# Patient Record
Sex: Female | Born: 1950 | Race: White | Hispanic: No | Smoking: Current every day smoker
Health system: Southern US, Community
[De-identification: ages and names within clinical notes are randomized; demographics above are authoritative.]

## PROBLEM LIST (undated history)

## (undated) DIAGNOSIS — F0390 Unspecified dementia without behavioral disturbance: Secondary | ICD-10-CM

## (undated) DIAGNOSIS — F419 Anxiety disorder, unspecified: Secondary | ICD-10-CM

## (undated) DIAGNOSIS — R0600 Dyspnea, unspecified: Secondary | ICD-10-CM

## (undated) DIAGNOSIS — I10 Essential (primary) hypertension: Secondary | ICD-10-CM

## (undated) DIAGNOSIS — F209 Schizophrenia, unspecified: Secondary | ICD-10-CM

## (undated) DIAGNOSIS — R51 Headache: Secondary | ICD-10-CM

## (undated) DIAGNOSIS — O2230 Deep phlebothrombosis in pregnancy, unspecified trimester: Secondary | ICD-10-CM

## (undated) DIAGNOSIS — R519 Headache, unspecified: Secondary | ICD-10-CM

## (undated) HISTORY — DX: Essential (primary) hypertension: I10

## (undated) HISTORY — PX: DIAGNOSTIC LAPAROSCOPY: SUR761

---

## 2005-12-13 ENCOUNTER — Emergency Department (HOSPITAL_COMMUNITY): Admission: EM | Admit: 2005-12-13 | Discharge: 2005-12-13 | Payer: Self-pay | Admitting: Emergency Medicine

## 2009-12-04 ENCOUNTER — Ambulatory Visit (HOSPITAL_COMMUNITY): Payer: Self-pay | Admitting: Psychiatry

## 2010-01-07 ENCOUNTER — Ambulatory Visit: Payer: Self-pay | Admitting: Internal Medicine

## 2010-01-19 ENCOUNTER — Encounter
Admission: RE | Admit: 2010-01-19 | Discharge: 2010-01-19 | Payer: Self-pay | Source: Home / Self Care | Attending: Neurology | Admitting: Neurology

## 2010-10-03 ENCOUNTER — Encounter: Payer: Medicaid Other | Attending: Family Medicine | Admitting: *Deleted

## 2010-10-03 ENCOUNTER — Encounter: Payer: Self-pay | Admitting: *Deleted

## 2010-10-03 DIAGNOSIS — Z713 Dietary counseling and surveillance: Secondary | ICD-10-CM | POA: Insufficient documentation

## 2010-10-03 DIAGNOSIS — E119 Type 2 diabetes mellitus without complications: Secondary | ICD-10-CM | POA: Insufficient documentation

## 2010-10-03 NOTE — Progress Notes (Signed)
  Medical Nutrition Therapy:  Appt start time: 1030   End time:  1130.  Assessment:  Primary concerns today: T2DM. Pt here with neighbor for assessment.  Appears medicated and a little "out of it"; c/o pain in feet. States she hasn't been to the podiatrist, but then reports her DM shoes are on order.  Per neighbor, pt lives with daughter who is a Engineer, civil (consulting) and makes sure she is eating well. Pt reports recent FBGs of 131-146 mg/dL.  A1c unavailable at time of visit. Neighbor reports pt walks 2 blocks every night at daughter's insistence. Pt reports both she and daughter grocery shop and cook meals.   MEDICATIONS: Metformin, Invega, Trazadone (prn)   DIETARY INTAKE:  Usual eating pattern includes 3 meals and 0 snacks per day.  24-hr recall:  B ( AM): Special K w/ 2% milk, wheat toast w/ butter, egg; 2% milk  Snk ( AM): none  L ( PM): [place] steak, squash, peaches, mac-n-cheese, roll, chocolate cookie; water, milk (at [employer]) Snk ( PM): none D ( PM): Crab meat, broccoli w/ cheddar cheese, roasted potatoes; serial performance (gatorade) Snk ( PM): none  Usual physical activity: Walks 2 blocks 5 day/week per neighbor  Estimated energy needs: 1300 calories 155 g carbohydrates 75 g protein 40-45 g fat  Nutritional Diagnosis:  [place]-2.1 Impaired nutrient utilization related to CHO intake as evidenced by diagnosis of T2DM and MD referral for assessment.    Intervention/Goals:  Follow Diabetes Meal Plan as instructed (inside tan book).  Eat 3 meals and 1-2 snacks, or every 3-5 hrs.  Limit carbohydrate intake to 45 grams/meal and 15 grams/snack.  Add lean protein foods to all meals/snacks.  Aim for 30 mins of physical activity daily.  Bring food record and glucose log to your next nutrition visit.   Handouts given during visit include:  Healthy Eating Guidelines - BD   Count Your Carbs: Getting Started - book  45 g CHO meals  CHO/Pro snack list  Monitoring/Evaluation:   Dietary intake, exercise, A1c, BGs, and body weight prn or if A1c/BG trend worsens.

## 2010-10-03 NOTE — Patient Instructions (Signed)
Goals:  Follow Diabetes Meal Plan as instructed (inside tan book).  Eat 3 meals and 1-2 snacks, or every 3-5 hrs.  Limit carbohydrate intake to 45 grams/meal and 15 grams/snack.  Add lean protein foods to all meals/snacks.  Aim for 30 mins of physical activity daily.  Bring food record and glucose log to your next nutrition visit.

## 2011-04-11 ENCOUNTER — Emergency Department (HOSPITAL_COMMUNITY)
Admission: EM | Admit: 2011-04-11 | Discharge: 2011-04-11 | Disposition: A | Discharge status: Home / Self Care | Payer: Medicaid Other | Attending: Emergency Medicine | Admitting: Emergency Medicine

## 2011-04-11 ENCOUNTER — Emergency Department (HOSPITAL_COMMUNITY): Payer: Medicaid Other

## 2011-04-11 ENCOUNTER — Encounter (HOSPITAL_COMMUNITY): Payer: Self-pay | Admitting: *Deleted

## 2011-04-11 DIAGNOSIS — F17200 Nicotine dependence, unspecified, uncomplicated: Secondary | ICD-10-CM | POA: Insufficient documentation

## 2011-04-11 DIAGNOSIS — M25539 Pain in unspecified wrist: Secondary | ICD-10-CM | POA: Insufficient documentation

## 2011-04-11 DIAGNOSIS — M545 Low back pain, unspecified: Secondary | ICD-10-CM | POA: Insufficient documentation

## 2011-04-11 DIAGNOSIS — S6990XA Unspecified injury of unspecified wrist, hand and finger(s), initial encounter: Secondary | ICD-10-CM | POA: Insufficient documentation

## 2011-04-11 DIAGNOSIS — R51 Headache: Secondary | ICD-10-CM | POA: Insufficient documentation

## 2011-04-11 DIAGNOSIS — Y92009 Unspecified place in unspecified non-institutional (private) residence as the place of occurrence of the external cause: Secondary | ICD-10-CM | POA: Insufficient documentation

## 2011-04-11 DIAGNOSIS — S59909A Unspecified injury of unspecified elbow, initial encounter: Secondary | ICD-10-CM | POA: Insufficient documentation

## 2011-04-11 DIAGNOSIS — W19XXXA Unspecified fall, initial encounter: Secondary | ICD-10-CM

## 2011-04-11 DIAGNOSIS — M542 Cervicalgia: Secondary | ICD-10-CM | POA: Insufficient documentation

## 2011-04-11 DIAGNOSIS — W108XXA Fall (on) (from) other stairs and steps, initial encounter: Secondary | ICD-10-CM | POA: Insufficient documentation

## 2011-04-11 DIAGNOSIS — M546 Pain in thoracic spine: Secondary | ICD-10-CM | POA: Insufficient documentation

## 2011-04-11 DIAGNOSIS — M25559 Pain in unspecified hip: Secondary | ICD-10-CM | POA: Insufficient documentation

## 2011-04-11 HISTORY — DX: Schizophrenia, unspecified: F20.9

## 2011-04-11 LAB — CBC
HCT: 41.8 % (ref 36.0–46.0)
MCHC: 34.7 g/dL (ref 30.0–36.0)
RBC: 4.82 MIL/uL (ref 3.87–5.11)
RDW: 15.4 % (ref 11.5–15.5)
WBC: 10.6 10*3/uL — ABNORMAL HIGH (ref 4.0–10.5)

## 2011-04-11 LAB — DIFFERENTIAL
Basophils Relative: 0 % (ref 0–1)
Lymphocytes Relative: 45 % (ref 12–46)
Lymphs Abs: 4.8 10*3/uL — ABNORMAL HIGH (ref 0.7–4.0)
Monocytes Relative: 5 % (ref 3–12)
Neutro Abs: 5.1 10*3/uL (ref 1.7–7.7)
Neutrophils Relative %: 48 % (ref 43–77)

## 2011-04-11 LAB — BASIC METABOLIC PANEL
Calcium: 9.6 mg/dL (ref 8.4–10.5)
Creatinine, Ser: 0.62 mg/dL (ref 0.50–1.10)
Potassium: 3.8 mEq/L (ref 3.5–5.1)
Sodium: 138 mEq/L (ref 135–145)

## 2011-04-11 MED ORDER — TRAMADOL HCL 50 MG PO TABS: 50.0000 mg | ORAL_TABLET | Freq: Four times a day (QID) | ORAL | Status: AC | PRN

## 2011-04-11 MED ORDER — ACETAMINOPHEN 325 MG PO TABS
650.0000 mg | ORAL_TABLET | Freq: Once | ORAL | Status: AC
  Administered 2011-04-11: 650 mg via ORAL
  Filled 2011-04-11: qty 2

## 2011-04-11 NOTE — ED Notes (Signed)
Pt states understanding of discharge instructions 

## 2011-04-11 NOTE — Discharge Instructions (Signed)
Multiple Traumas Bruises and sore muscles are common after injuries. These usually feel worse for the first 24 hours. You may have more stiffness and soreness over the next several hours to several days. It may be worse when you wake up the first morning following your injury. You will improve with each passing day. The amount of improvement often depends on the amount of damage done. HOME CARE INSTRUCTIONS   Put ice on the injured area.   Put ice in a plastic bag.   Place a towel between your skin and the bag.   Leave the ice on for 15 to 20 minutes, 3 to 4 times a day.   Drink more fluids than normal. Do not drink alcohol.   Take a hot or warm shower or bath once or twice a day. This will increase blood flow to sore muscles.   Return to activity as tolerated. Lifting may aggravate neck or back pain.   Only take over-the-counter or prescription medicines for pain, discomfort, or fever as directed by your caregiver. Do not use aspirin. This may increase bruising because aspirin decreases the ability of blood to form clots that stop bleeding.  SEEK IMMEDIATE MEDICAL CARE IF:  You have numbness, tingling, weakness, or problems with the use of your arms or legs.   You have severe headaches not relieved with medicines.   You have changes in bowel or bladder control.   You develop increasing pain in any areas of the body.   You have shortness of breath, dizziness, or fainting.   You have a fever.   You feel sick to your stomach (nauseous), throw up (vomit), or sweat.   You have increasing belly pain.   You have blood in your urine, stool, or vomit.   You have pain in either shoulder or in an area where a shoulder strap would be.   You feel lightheaded or faint.   You feel you may be getting worse rather than better.   Your symptoms are worsening.  MAKE SURE YOU:   Understand these instructions.   Will watch your condition.   Will get help right away if you are not doing  well or get worse.  Document Released: 01/26/2007 Document Revised: 12/26/2010 Document Reviewed: 06/26/2008 [facility] Patient Information 2012 [place], [place].

## 2011-04-11 NOTE — ED Notes (Signed)
Pt arrived via [facility] c/o Fall down 14 steps, c/o right hip and right lower back pain. Denies LOC. EMS found no deformities. EMS VS Pulse 84, BP 164/p, RR 16.

## 2011-04-11 NOTE — ED Provider Notes (Signed)
History     CSN: [id number]  Arrival date & time 04/11/11  8469   First MD Initiated Contact with Patient 04/11/11 0340      Chief Complaint  Patient presents with  . Fall    HPI The patient got up to go to the bathroom. She was walking by the steps when she lost her balance and fell. Her daughter states the patient tumbled down 14 steps. She is now complaining of pain in her hip and lower back. She's not sure she lost consciousness but she does have pain in the back of her neck as well. Patient has not had any abdominal pain or chest pain. She has had a recent cough but otherwise has felt fine. She lives at home with her daughter. Patient was transported by EMS. She arrived in a long spine board with a cervical collar in place. She states the pain is not very severe now and would just like and aspirin.  No past medical history on file.  No past surgical history on file.  No family history on file.  History  Substance Use Topics  . Smoking status: Current Everyday Smoker -- 0.2 packs/day    Types: Cigarettes  . Smokeless tobacco: Not on file  . Alcohol Use: No    OB History    Grav Para Term Preterm Abortions TAB SAB Ect Mult Living                  Review of Systems  All other systems reviewed and are negative.    Allergies  Review of patient's allergies indicates no known allergies.  Home Medications   Current Outpatient Rx  Name Route Sig Dispense Refill  . METFORMIN HCL PO Oral Take 500 mg by mouth daily.     Marland Kitchen MIRTAZAPINE 30 MG PO TBDP Oral Take 30 mg by mouth at bedtime as needed. For sleep    . ADULT MULTIVITAMIN W/MINERALS CH Oral Take 1 tablet by mouth daily.    . INVEGA PO Oral Take 18 mg by mouth daily.     . TRAZODONE HCL 50 MG PO TABS Oral Take 50 mg by mouth as needed.        BP 125/81  Pulse 69  Temp(Src) 98.1 F (36.7 C) (Oral)  Resp 18  SpO2 99%  Physical Exam  Nursing note and vitals reviewed. Constitutional: She appears well-developed  and well-nourished. No distress.  HENT:  Head: Normocephalic and atraumatic.  Right Ear: External ear normal.  Left Ear: External ear normal.  Eyes: Conjunctivae are normal. Right eye exhibits no discharge. Left eye exhibits no discharge. No scleral icterus.  Neck: Neck supple. No tracheal deviation present.  Cardiovascular: Normal rate, regular rhythm and intact distal pulses.   Pulmonary/Chest: Effort normal and breath sounds normal. No stridor. No respiratory distress. She has no wheezes. She has no rales.  Abdominal: Soft. Bowel sounds are normal. She exhibits no distension. There is no tenderness. There is no rebound and no guarding.  Musculoskeletal: She exhibits no edema and no tenderness.       Right wrist: She exhibits tenderness. She exhibits no swelling.       Cervical back: She exhibits decreased range of motion and tenderness. She exhibits no swelling and no edema.       Thoracic back: She exhibits tenderness. She exhibits no swelling and no edema.       Lumbar back: She exhibits tenderness. She exhibits no swelling and no edema.  Neurological:  She is alert. She has normal strength. No sensory deficit. Cranial nerve deficit:  no gross defecits noted. She exhibits normal muscle tone. She displays no seizure activity. Coordination normal.  Skin: Skin is warm and dry. No rash noted.  Psychiatric: She has a normal mood and affect.    ED Course  Procedures (including critical care time)  Labs Reviewed  DIFFERENTIAL - Abnormal; Notable for the following:    Lymphs Abs 4.8 (*)    All other components within normal limits  BASIC METABOLIC PANEL - Abnormal; Notable for the following:    Glucose, Bld 125 (*)    All other components within normal limits  CBC - Abnormal; Notable for the following:    WBC 10.6 (*)    All other components within normal limits   Dg Chest 1 View  04/11/2011  *RADIOLOGY REPORT*  Clinical Data: Status post fall down stairs; concern for chest injury.   CHEST - 1 VIEW  Comparison: Chest radiograph performed 12/13/2005  Findings: The lungs are well-aerated.  Chronic peribronchial thickening is noted.  There is no evidence of focal opacification, pleural effusion or pneumothorax.  The cardiomediastinal silhouette is within normal limits.  No acute osseous abnormalities are seen.  IMPRESSION: No acute cardiopulmonary process seen; no displaced rib fractures identified.  Chronic peribronchial thickening noted.  Original Report Authenticated By: Tonia Ghent, M.D.   Dg Thoracic Spine 2 View  04/11/2011  *RADIOLOGY REPORT*  Clinical Data: Status post fall; back pain.  THORACIC SPINE - 2 VIEW  Comparison: Chest radiograph performed 12/13/2005  Findings: There is no evidence of fracture or subluxation. Vertebral bodies demonstrate normal height and alignment.  Mild degenerative change is noted at the lower cervical spine.  The visualized portions of both lungs are clear.  The mediastinum is unremarkable in appearance.  IMPRESSION:  1.  No evidence of fracture or subluxation along the cervical spine. 2.  Mild degenerative change at the lower cervical spine.  Original Report Authenticated By: Tonia Ghent, M.D.   Dg Lumbar Spine Complete  04/11/2011  *RADIOLOGY REPORT*  Clinical Data: Status post fall down stairs; lower back pain.  LUMBAR SPINE - COMPLETE 4+ VIEW  Comparison: None.  Findings: There is no evidence of fracture or subluxation. Vertebral bodies demonstrate normal height and alignment. Intervertebral disc spaces are preserved.  The visualized bowel gas pattern is unremarkable in appearance; air and stool are noted within the colon.  The sacroiliac joints are within normal limits.  Mild scattered calcification is noted at the distal abdominal aorta.  IMPRESSION: No evidence of fracture or subluxation along the lumbar spine.  Original Report Authenticated By: Tonia Ghent, M.D.   Dg Wrist Complete Right  04/11/2011  *RADIOLOGY REPORT*  Clinical Data:  Status post fall; right wrist pain.  RIGHT WRIST - COMPLETE 3+ VIEW  Comparison: None.  Findings: A small osseous fragment is noted along the dorsum of the carpal rows, raising question for a small fracture.  There is no additional evidence for fracture.  The carpal rows demonstrate normal alignment.  The joint spaces are preserved.  No significant soft tissue abnormalities are seen.  IMPRESSION: Small osseous fragment noted along the dorsum of the carpal rows on the lateral view, raising question for a small fracture.  No additional evidence for fracture.  Original Report Authenticated By: Tonia Ghent, M.D.   Dg Hip Complete Right  04/11/2011  *RADIOLOGY REPORT*  Clinical Data: Status post fall down stairs; right hip pain.  RIGHT HIP - COMPLETE  2+ VIEW  Comparison: None.  Findings: There is no evidence of fracture or dislocation.  Both femoral heads are seated normally within their respective acetabula.  The proximal right femur appears intact.  No significant degenerative change is appreciated.  The sacroiliac joints are unremarkable in appearance.  The visualized bowel gas pattern is grossly unremarkable in appearance.  Scattered vascular calcifications are noted within the pelvis.  IMPRESSION: No evidence of fracture or dislocation.  Original Report Authenticated By: Tonia Ghent, M.D.   Ct Head Wo Contrast  04/11/2011  *RADIOLOGY REPORT*  Clinical Data:  Status post fall; neck pain and headache.  CT HEAD WITHOUT CONTRAST AND CT CERVICAL SPINE WITHOUT CONTRAST  Technique:  Multidetector CT imaging of the head and cervical spine was performed following the standard protocol without intravenous contrast.  Multiplanar CT image reconstructions of the cervical spine were also generated.  Comparison: MRI of the brain performed 01/19/2010  CT HEAD  Findings: There is no evidence of acute infarction, mass lesion, or intra- or extra-axial hemorrhage on CT.  The posterior fossa, including the cerebellum,  brainstem and fourth ventricle, is within normal limits.  The third and lateral ventricles, and basal ganglia are unremarkable in appearance.  The cerebral hemispheres are symmetric in appearance, with normal gray- white differentiation.  No mass effect or midline shift is seen.  There is no evidence of fracture; visualized osseous structures are unremarkable in appearance.  The visualized portions of the orbits are within normal limits.  The paranasal sinuses and mastoid air cells are well-aerated.  No significant soft tissue abnormalities are seen.  IMPRESSION: No evidence of traumatic intracranial injury or fracture.  CT CERVICAL SPINE  Findings: There is no evidence of fracture or subluxation. Vertebral bodies demonstrate normal height and alignment.  There is mild multilevel disc space narrowing along the lower cervical spine, with associated anterior and posterior disc osteophyte complexes.  Mild associated kyphosis is seen.  Prevertebral soft tissues are within normal limits.  The thyroid gland is unremarkable in appearance.  Mild emphysema is suggested at the lung apices.  No significant soft tissue abnormalities are seen.  IMPRESSION:  1.  No evidence of fracture or subluxation along the cervical spine. 2.  Mild degenerative change at the lower cervical spine, with associated mild kyphosis. 3.  Suggestion of mild emphysema at the lung apices.  Original Report Authenticated By: Tonia Ghent, M.D.   Ct Cervical Spine Wo Contrast  04/11/2011  *RADIOLOGY REPORT*  Clinical Data:  Status post fall; neck pain and headache.  CT HEAD WITHOUT CONTRAST AND CT CERVICAL SPINE WITHOUT CONTRAST  Technique:  Multidetector CT imaging of the head and cervical spine was performed following the standard protocol without intravenous contrast.  Multiplanar CT image reconstructions of the cervical spine were also generated.  Comparison: MRI of the brain performed 01/19/2010  CT HEAD  Findings: There is no evidence of acute  infarction, mass lesion, or intra- or extra-axial hemorrhage on CT.  The posterior fossa, including the cerebellum, brainstem and fourth ventricle, is within normal limits.  The third and lateral ventricles, and basal ganglia are unremarkable in appearance.  The cerebral hemispheres are symmetric in appearance, with normal gray- white differentiation.  No mass effect or midline shift is seen.  There is no evidence of fracture; visualized osseous structures are unremarkable in appearance.  The visualized portions of the orbits are within normal limits.  The paranasal sinuses and mastoid air cells are well-aerated.  No significant soft tissue abnormalities are  seen.  IMPRESSION: No evidence of traumatic intracranial injury or fracture.  CT CERVICAL SPINE  Findings: There is no evidence of fracture or subluxation. Vertebral bodies demonstrate normal height and alignment.  There is mild multilevel disc space narrowing along the lower cervical spine, with associated anterior and posterior disc osteophyte complexes.  Mild associated kyphosis is seen.  Prevertebral soft tissues are within normal limits.  The thyroid gland is unremarkable in appearance.  Mild emphysema is suggested at the lung apices.  No significant soft tissue abnormalities are seen.  IMPRESSION:  1.  No evidence of fracture or subluxation along the cervical spine. 2.  Mild degenerative change at the lower cervical spine, with associated mild kyphosis. 3.  Suggestion of mild emphysema at the lung apices.  Original Report Authenticated By: Tonia Ghent, M.D.     1. Fall   2. Wrist injury       MDM  The patient fortunately does not appear to have suffered any serious injuries. The wrist x-ray shows a possibility of a carpal bone fracture. The patient was placed in a splint and I will give her a referral to Dr. Terese Door who is on call for her hand injuries today. Findings have been discussed with patient and her        Celene Kras,  MD 04/11/11 [phone]

## 2011-06-30 ENCOUNTER — Encounter (INDEPENDENT_AMBULATORY_CARE_PROVIDER_SITE_OTHER): Payer: Self-pay

## 2011-07-21 ENCOUNTER — Other Ambulatory Visit (INDEPENDENT_AMBULATORY_CARE_PROVIDER_SITE_OTHER): Payer: Self-pay | Admitting: General Surgery

## 2011-07-21 ENCOUNTER — Other Ambulatory Visit: Payer: Self-pay | Admitting: Internal Medicine

## 2011-07-21 ENCOUNTER — Encounter (INDEPENDENT_AMBULATORY_CARE_PROVIDER_SITE_OTHER): Payer: Self-pay | Admitting: General Surgery

## 2011-07-21 ENCOUNTER — Ambulatory Visit (INDEPENDENT_AMBULATORY_CARE_PROVIDER_SITE_OTHER): Payer: Medicaid Other | Admitting: General Surgery

## 2011-07-21 VITALS — BP 118/84 | HR 77 | Temp 97.2°F | Resp 16 | Ht 64.0 in | Wt 125.4 lb

## 2011-07-21 DIAGNOSIS — K824 Cholesterolosis of gallbladder: Secondary | ICD-10-CM

## 2011-07-21 DIAGNOSIS — R160 Hepatomegaly, not elsewhere classified: Secondary | ICD-10-CM

## 2011-07-21 DIAGNOSIS — K769 Liver disease, unspecified: Secondary | ICD-10-CM

## 2011-07-21 NOTE — Patient Instructions (Signed)
Follow up in 2-3 weeks after MRI.

## 2011-07-21 NOTE — Assessment & Plan Note (Signed)
Polyp likely benign, but is large Pt also has 1.4 cm liver mass. Given both in conjunction, would order MRI liver to rule out malignancy.    I will follow up after MRI.  Pt has paranoid schizophrenia, so this complicates exam.  Her daughter makes medical decisions.

## 2011-07-21 NOTE — Progress Notes (Signed)
Chief Complaint  Patient presents with  . Other    new pt-eval GB lesion    HISTORY: Pt presents with probable gallbladder polyp on imaging.  She was having some RUQ pain.  She complained of pain with movement and with food.  She underwent ultrasound which showed a lesion in her liver and a possible 3.5 cm gallbladder polyp.  She is no longer having any abdominal pain, but does have bloating.  She denies nausea or vomiting.  She denies jaundice.  She is unaware of family members with gallbladder disease.  She has schizophrenia and is unable to fully follow the conversation.  The history is corroborated with the daughter.    Past Medical History  Diagnosis Date  . Schizophrenia   . Diabetes mellitus   . Hypertension     History reviewed. No pertinent past surgical history.  Current Outpatient Prescriptions  Medication Sig Dispense Refill  . lisinopril (PRINIVIL,ZESTRIL) 5 MG tablet Take 5 mg by mouth daily.      Marland Kitchen METFORMIN HCL PO Take 500 mg by mouth daily.       . mirtazapine (REMERON SOL-TAB) 30 MG disintegrating tablet Take 30 mg by mouth at bedtime as needed. For sleep      . Multiple Vitamin (MULITIVITAMIN WITH MINERALS) TABS Take 1 tablet by mouth daily.      . Paliperidone (INVEGA PO) Take 18 mg by mouth daily.       . traZODone (DESYREL) 50 MG tablet Take 50 mg by mouth as needed.           No Known Allergies   Family History  Problem Relation Age of Onset  . Diabetes Sister   . Heart disease Mother      History   Social History  . Marital Status: Divorced    Spouse Name: N/A    Number of Children: N/A  . Years of Education: N/A   Social History Main Topics  . Smoking status: Current Everyday Smoker -- 0.2 packs/day    Types: Cigarettes  . Smokeless tobacco: None  . Alcohol Use: No  . Drug Use: Yes    Special: Marijuana  . Sexually Active: None     quit 5 years ago     REVIEW OF SYSTEMS - PERTINENT POSITIVES ONLY: 12 point review of systems negative  other than HPI and PMH  EXAM: Filed Vitals:   07/21/11 0923  BP: 118/84  Pulse: 77  Temp: 97.2 F (36.2 C)  Resp: 16    Gen:  No acute distress.  Well nourished and well groomed.   Neurological: Alert and oriented to person, place, and time. Coordination normal.  Head: Normocephalic and atraumatic.  Eyes: Conjunctivae are normal. Pupils are equal, round, and reactive to light. No scleral icterus.  Neck: Normal range of motion. Neck supple. No tracheal deviation or thyromegaly present.  Cardiovascular: Normal rate, regular rhythm, normal heart sounds and intact distal pulses.  Exam reveals no gallop and no friction rub.  No murmur heard. Respiratory: Effort normal.  No respiratory distress. No chest wall tenderness. Breath sounds normal.  No wheezes, rales or rhonchi.  GI: Soft. Bowel sounds are normal. Slightly bloated.  The abdomen is soft and nontender.  There is no rebound and no guarding.  Musculoskeletal: Normal range of motion. Extremities are nontender.  Lymphadenopathy: No cervical, preauricular, postauricular or axillary adenopathy is present Skin: Skin is warm and dry. No rash noted. No diaphoresis. No erythema. No pallor. No clubbing, cyanosis, or  edema.   Psychiatric: Flat affect.  Does not seem to understand anything that we are discussing.       RADIOLOGY RESULTS: Ultrasound at [facility].   Small subcapsular left hepatic lobe nodule.  Likely hemangioma, but unable to fully characterize. Large lobulated non mobile intraluminal GB lesion.  3.5 cm Large polyp vs sludge.    ASSESSMENT AND PLAN: Gallbladder polyp Polyp likely benign, but is large Pt also has 1.4 cm liver mass. Given both in conjunction, would order MRI liver to rule out malignancy.    I will follow up after MRI.  Pt has paranoid schizophrenia, so this complicates exam.  Her daughter makes medical decisions.    Pt does not seem symptomatic from gallbladder disease at this time in terms of pain,  but she does complain of bloating.  Will likely need cholecystectomy for polyp.    Maudry Diego MD Surgical Oncology, General and Endocrine Surgery [facility]      Visit Diagnoses: 1. Gallbladder polyp   2. Liver mass     Primary Care Physician: Alva Garnet., MD

## 2011-07-29 ENCOUNTER — Ambulatory Visit
Admission: RE | Admit: 2011-07-29 | Discharge: 2011-07-29 | Disposition: A | Discharge status: Home / Self Care | Payer: Medicaid Other | Source: Ambulatory Visit | Attending: General Surgery | Admitting: General Surgery

## 2011-07-29 DIAGNOSIS — K824 Cholesterolosis of gallbladder: Secondary | ICD-10-CM

## 2011-07-29 MED ORDER — GADOBENATE DIMEGLUMINE 529 MG/ML IV SOLN
10.0000 mL | Freq: Once | INTRAVENOUS | Status: AC | PRN
  Administered 2011-07-29: 10 mL via INTRAVENOUS

## 2011-08-08 ENCOUNTER — Encounter (INDEPENDENT_AMBULATORY_CARE_PROVIDER_SITE_OTHER): Payer: Medicaid Other | Admitting: General Surgery

## 2011-08-18 NOTE — Progress Notes (Signed)
Quick Note:  Please let pt's sister know that there is no evidence of gallbladder mass on MRI. Pt does have a few liver lesions that appear benign, but we should get repeat scan in 3 months to reassess. ______

## 2011-08-19 ENCOUNTER — Telehealth (INDEPENDENT_AMBULATORY_CARE_PROVIDER_SITE_OTHER): Payer: Self-pay

## 2011-08-19 NOTE — Telephone Encounter (Signed)
Notified the pt's daughter of MRI results.  We will f/u in 3 months and order a repeat scan.  She will relay the message to Ms. Jacinto Reap.

## 2011-09-05 ENCOUNTER — Encounter (INDEPENDENT_AMBULATORY_CARE_PROVIDER_SITE_OTHER): Payer: Medicaid Other | Admitting: General Surgery

## 2011-09-29 ENCOUNTER — Encounter (INDEPENDENT_AMBULATORY_CARE_PROVIDER_SITE_OTHER): Payer: Medicaid Other | Admitting: General Surgery

## 2011-10-07 ENCOUNTER — Encounter (INDEPENDENT_AMBULATORY_CARE_PROVIDER_SITE_OTHER): Payer: Self-pay | Admitting: General Surgery

## 2011-11-10 ENCOUNTER — Telehealth (INDEPENDENT_AMBULATORY_CARE_PROVIDER_SITE_OTHER): Payer: Self-pay

## 2011-11-10 NOTE — Telephone Encounter (Signed)
Unable to leave message.  Pt is due for MRI in late October.

## 2011-11-26 ENCOUNTER — Other Ambulatory Visit (INDEPENDENT_AMBULATORY_CARE_PROVIDER_SITE_OTHER): Payer: Self-pay | Admitting: General Surgery

## 2011-11-26 DIAGNOSIS — R160 Hepatomegaly, not elsewhere classified: Secondary | ICD-10-CM

## 2011-12-24 ENCOUNTER — Other Ambulatory Visit: Payer: Medicaid Other

## 2012-01-27 ENCOUNTER — Other Ambulatory Visit (INDEPENDENT_AMBULATORY_CARE_PROVIDER_SITE_OTHER): Payer: Self-pay | Admitting: General Surgery

## 2012-01-27 DIAGNOSIS — R160 Hepatomegaly, not elsewhere classified: Secondary | ICD-10-CM

## 2012-11-22 ENCOUNTER — Ambulatory Visit (INDEPENDENT_AMBULATORY_CARE_PROVIDER_SITE_OTHER): Payer: Medicaid Other | Admitting: General Surgery

## 2012-11-24 ENCOUNTER — Encounter (INDEPENDENT_AMBULATORY_CARE_PROVIDER_SITE_OTHER): Payer: Self-pay | Admitting: General Surgery

## 2013-01-24 ENCOUNTER — Ambulatory Visit (INDEPENDENT_AMBULATORY_CARE_PROVIDER_SITE_OTHER): Payer: Medicaid Other | Admitting: General Surgery

## 2013-02-03 ENCOUNTER — Ambulatory Visit (INDEPENDENT_AMBULATORY_CARE_PROVIDER_SITE_OTHER): Payer: Medicaid Other | Admitting: General Surgery

## 2013-06-09 ENCOUNTER — Encounter: Payer: Self-pay | Admitting: *Deleted

## 2013-06-10 ENCOUNTER — Encounter (INDEPENDENT_AMBULATORY_CARE_PROVIDER_SITE_OTHER): Payer: Self-pay

## 2013-06-10 ENCOUNTER — Ambulatory Visit (INDEPENDENT_AMBULATORY_CARE_PROVIDER_SITE_OTHER): Payer: Medicaid Other | Admitting: Neurology

## 2013-06-10 ENCOUNTER — Encounter: Payer: Self-pay | Admitting: Neurology

## 2013-06-10 VITALS — BP 125/73 | HR 76 | Ht 64.0 in | Wt 121.0 lb

## 2013-06-10 DIAGNOSIS — F209 Schizophrenia, unspecified: Secondary | ICD-10-CM

## 2013-06-10 DIAGNOSIS — R4189 Other symptoms and signs involving cognitive functions and awareness: Secondary | ICD-10-CM

## 2013-06-10 DIAGNOSIS — R251 Tremor, unspecified: Secondary | ICD-10-CM

## 2013-06-10 DIAGNOSIS — R259 Unspecified abnormal involuntary movements: Secondary | ICD-10-CM

## 2013-06-10 DIAGNOSIS — F09 Unspecified mental disorder due to known physiological condition: Secondary | ICD-10-CM

## 2013-06-10 NOTE — Progress Notes (Signed)
[facility]    Provider:  Dr Janann Colonel Referring Provider: Everardo Beals, NP Primary Care Physician:  Imelda Pillow, NP  CC:  Abnormal behavior  HPI:  Kristen Colon is a 63 y.o. female here as a referral from Dr. Benna Dunks for abnormal behavior    Kristen Colon is a 63 y.o. female here as a referral for erratic behavior, nervousness/anxiety and memory decline. Daughter notes a higher obsession with smoking, notes she is constantly moving. She has a known diagnosis of schizophrenia and is followed by Dr Burman Foster. Daughter notes difficulty with short term memory, will have conversations and immediately forget what she talks about. Patient reports having trouble recalling things she reads. Daughter notes an overall functional decline. She has been on Invega for the schizophrenia for > 10years, daughter is not sure if it is still working.   She has bilateral cataracts and decreased visual acuity related to this, patient does not wish to have surgical correction.   CT head reviewed and unremarkable  Review of Systems: Out of a complete 14 system review, the patient complains of only the following symptoms, and all other reviewed systems are negative. + blurred vision, weight loss, fatigue, memory loss, confusion, numbness, weakness  History   Social History  . Marital Status: Divorced    Spouse Name: N/A    Number of Children: N/A  . Years of Education: N/A   Occupational History  . Not on file.   Social History Main Topics  . Smoking status: Current Every Day Smoker -- 0.20 packs/day    Types: Cigarettes  . Smokeless tobacco: Not on file  . Alcohol Use: No  . Drug Use: Yes    Special: Marijuana  . Sexual Activity: Not on file     Comment: quit 5 years ago   Other Topics Concern  . Not on file   Social History Narrative  . No narrative on file    Family History  Problem Relation Age of Onset  . Diabetes Sister   . Heart disease Mother      Past Medical History  Diagnosis Date  . Schizophrenia   . Diabetes mellitus   . Hypertension     No past surgical history on file.  Current Outpatient Prescriptions  Medication Sig Dispense Refill  . brompheniramine-pseudoephedrine (DIMETAPP) 1-15 MG/5ML ELIX Take by mouth 2 (two) times daily as needed for allergies.      Marland Kitchen gabapentin (NEURONTIN) 300 MG capsule Take 300 mg by mouth 3 (three) times daily.      Marland Kitchen glucose blood test strip 1 each by Other route as needed for other. Use as instructed      . Influenza Vac Typ A&B Surf Ant (FLUVIRIN) 0.5 ML SUSY Inject into the muscle.      . lisinopril (PRINIVIL,ZESTRIL) 5 MG tablet Take 5 mg by mouth daily.      Marland Kitchen METFORMIN HCL PO Take 500 mg by mouth daily.       . mirtazapine (REMERON SOL-TAB) 30 MG disintegrating tablet Take 30 mg by mouth at bedtime as needed. For sleep      . Multiple Vitamin (MULITIVITAMIN WITH MINERALS) TABS Take 1 tablet by mouth daily.      . Paliperidone (INVEGA PO) Take 18 mg by mouth daily.       . traZODone (DESYREL) 50 MG tablet Take 50 mg by mouth as needed.         No current facility-administered medications for this visit.  Allergies as of 06/10/2013  . (No Known Allergies)    Vitals: BP 125/73  Pulse 76  Ht 5\' 4"  (1.626 m)  Wt 121 lb (54.885 kg)  BMI 20.76 kg/m2 Last Weight:  Wt Readings from Last 1 Encounters:  06/10/13 121 lb (54.885 kg)   Last Height:   Ht Readings from Last 1 Encounters:  06/10/13 5\' 4"  (1.626 m)     Physical exam: Exam: Gen: NAD, conversant Eyes: anicteric sclerae, moist conjunctivae HENT: Atraumatic, oropharynx clear Neck: Trachea midline; supple,  Lungs: CTA, no wheezing, rales, rhonic                          CV: RRR, no MRG Abdomen: Soft, non-tender;  Extremities: No peripheral edema  Skin: Normal temperature, no rash,  Psych: Appropriate affect, pleasant  Neuro: MS:    CN: PERRL, EOMI no nystagmus, no ptosis, sensation intact to LT V1-V3  bilat, face symmetric, no weakness, hearing grossly intact, palate elevates symmetrically, shoulder shrug 5/5 bilat,  tongue protrudes midline, no fasiculations noted.  Motor: normal bulk and tone Strength: 5/5  In all extremities  Coord: mild rest tremor bilateral hands, L>R, minimal bradykinesia noted with finger taps, hand opening  Reflexes: symmetrical, bilat downgoing toes  Sens: LT intact in all extremities  Gait: unsteady, slightly wide based, able to tandem   Assessment:  After physical and neurologic examination, review of laboratory studies, imaging, neurophysiology testing and pre-existing records, assessment will be reviewed on the problem list.  Plan:  Treatment plan and additional workup will be reviewed under Problem List.  1)Anxiety 2)Cognitive decline 3)Tremor 4)Schizophrenia  62y/0 woman presenting for initial evaluation of increased anxiety, obsessive behavior, cognitive decline. Patient has a history of schizophrenia for which she follows with Dr. Casimiro Needle and is on a stable dose of Invega. Suspect her symptoms are likely multifactorial and mainly related to underlying schizophrenia. Tremor likely medication induced from [place]. Even if there is a true Parkinson's disease the risk of treating her with L-dopa would likely be too high. Will check blood work, can consider cognitive enhancer such as Aricept though would need to discuss with her psychiatrist. Patient to follow up with psychiatry for further optimization of schizophrenia medications.   Jim Like, DO  [facility] [address] [place], [place] [address]  Phone [phone] Fax [phone]

## 2013-06-14 NOTE — Progress Notes (Signed)
Quick Note:  Left message of normal labs. ______

## 2013-06-16 LAB — VITAMIN B1, WHOLE BLOOD: THIAMINE: 108 nmol/L (ref 66.5–200.0)

## 2013-06-16 LAB — METHYLMALONIC ACID, SERUM: METHYLMALONIC ACID: 66 nmol/L (ref 0–378)

## 2013-06-16 LAB — VITAMIN B12

## 2013-06-16 LAB — TSH: TSH: 0.743 u[IU]/mL (ref 0.450–4.500)

## 2014-03-14 ENCOUNTER — Encounter (HOSPITAL_COMMUNITY): Payer: Self-pay | Admitting: Emergency Medicine

## 2014-03-14 ENCOUNTER — Emergency Department (HOSPITAL_COMMUNITY)
Admission: EM | Admit: 2014-03-14 | Discharge: 2014-03-15 | Disposition: A | Discharge status: Home / Self Care | Payer: Medicaid Other | Attending: Emergency Medicine | Admitting: Emergency Medicine

## 2014-03-14 DIAGNOSIS — Z79899 Other long term (current) drug therapy: Secondary | ICD-10-CM | POA: Diagnosis not present

## 2014-03-14 DIAGNOSIS — I10 Essential (primary) hypertension: Secondary | ICD-10-CM | POA: Insufficient documentation

## 2014-03-14 DIAGNOSIS — F209 Schizophrenia, unspecified: Secondary | ICD-10-CM | POA: Diagnosis not present

## 2014-03-14 DIAGNOSIS — Z008 Encounter for other general examination: Secondary | ICD-10-CM | POA: Diagnosis present

## 2014-03-14 DIAGNOSIS — R441 Visual hallucinations: Secondary | ICD-10-CM

## 2014-03-14 DIAGNOSIS — Z720 Tobacco use: Secondary | ICD-10-CM | POA: Insufficient documentation

## 2014-03-14 DIAGNOSIS — E119 Type 2 diabetes mellitus without complications: Secondary | ICD-10-CM | POA: Insufficient documentation

## 2014-03-14 LAB — CBC
HEMATOCRIT: 41.8 % (ref 36.0–46.0)
Hemoglobin: 14.4 g/dL (ref 12.0–15.0)
MCH: 30 pg (ref 26.0–34.0)
MCHC: 34.4 g/dL (ref 30.0–36.0)
MCV: 87.1 fL (ref 78.0–100.0)
Platelets: 285 10*3/uL (ref 150–400)
RBC: 4.8 MIL/uL (ref 3.87–5.11)
RDW: 15.6 % — ABNORMAL HIGH (ref 11.5–15.5)
WBC: 12.7 10*3/uL — ABNORMAL HIGH (ref 4.0–10.5)

## 2014-03-14 LAB — RAPID URINE DRUG SCREEN, HOSP PERFORMED
Amphetamines: NOT DETECTED
BARBITURATES: NOT DETECTED
BENZODIAZEPINES: NOT DETECTED
COCAINE: NOT DETECTED
OPIATES: NOT DETECTED
Tetrahydrocannabinol: NOT DETECTED

## 2014-03-14 LAB — SALICYLATE LEVEL: Salicylate Lvl: <4 mg/dL (ref 2.8–20.0)

## 2014-03-14 LAB — COMPREHENSIVE METABOLIC PANEL
ALBUMIN: 4.4 g/dL (ref 3.5–5.2)
ALT: 20 U/L (ref 0–35)
AST: 18 U/L (ref 0–37)
Alkaline Phosphatase: 82 U/L (ref 39–117)
Anion gap: 7 (ref 5–15)
BILIRUBIN TOTAL: 0.5 mg/dL (ref 0.3–1.2)
BUN: 15 mg/dL (ref 6–23)
CHLORIDE: 105 mmol/L (ref 96–112)
CO2: 29 mmol/L (ref 19–32)
CREATININE: 0.86 mg/dL (ref 0.50–1.10)
Calcium: 10.3 mg/dL (ref 8.4–10.5)
GFR, EST AFRICAN AMERICAN: 81 mL/min — AB (ref >90)
GFR, EST NON AFRICAN AMERICAN: 70 mL/min — AB (ref >90)
GLUCOSE: 119 mg/dL — AB (ref 70–99)
Potassium: 3.8 mmol/L (ref 3.5–5.1)
Sodium: 141 mmol/L (ref 135–145)
Total Protein: 7.6 g/dL (ref 6.0–8.3)

## 2014-03-14 LAB — ETHANOL

## 2014-03-14 LAB — ACETAMINOPHEN LEVEL: Acetaminophen (Tylenol), Serum: <10 ug/mL — ABNORMAL LOW (ref 10–30)

## 2014-03-14 NOTE — ED Notes (Addendum)
Pt's daughter going home, asked to be called by provider with update.  [phone]

## 2014-03-14 NOTE — ED Notes (Signed)
Arrives with daughter who sts pt was sent by pt's psych MD for eval, denies SI/HI, pt calm, alert and cooperative and in NAD
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
## 2014-03-15 LAB — URINALYSIS, ROUTINE W REFLEX MICROSCOPIC
Bilirubin Urine: NEGATIVE
GLUCOSE, UA: NEGATIVE mg/dL
Hgb urine dipstick: NEGATIVE
KETONES UR: NEGATIVE mg/dL
Nitrite: NEGATIVE
PROTEIN: NEGATIVE mg/dL
Specific Gravity, Urine: 1.02 (ref 1.005–1.030)
UROBILINOGEN UA: 1 mg/dL (ref 0.0–1.0)
pH: 7.5 (ref 5.0–8.0)

## 2014-03-15 LAB — URINE MICROSCOPIC-ADD ON

## 2014-03-15 MED ORDER — MIRTAZAPINE 30 MG PO TBDP
30.0000 mg | ORAL_TABLET | Freq: Every day | ORAL | Status: DC
  Administered 2014-03-15: 30 mg via ORAL
  Filled 2014-03-15 (×2): qty 1

## 2014-03-15 MED ORDER — METFORMIN HCL 500 MG PO TABS
500.0000 mg | ORAL_TABLET | Freq: Every day | ORAL | Status: DC
  Administered 2014-03-15: 500 mg via ORAL
  Filled 2014-03-15: qty 1

## 2014-03-15 MED ORDER — PALIPERIDONE ER 6 MG PO TB24
18.0000 mg | ORAL_TABLET | Freq: Every day | ORAL | Status: DC
  Administered 2014-03-15: 18 mg via ORAL
  Filled 2014-03-15 (×2): qty 3

## 2014-03-15 MED ORDER — TRAZODONE HCL 50 MG PO TABS: 50.0000 mg | ORAL_TABLET | ORAL | Status: DC | PRN

## 2014-03-15 MED ORDER — ONDANSETRON HCL 4 MG PO TABS: 4.0000 mg | ORAL_TABLET | Freq: Three times a day (TID) | ORAL | Status: DC | PRN

## 2014-03-15 MED ORDER — IBUPROFEN 400 MG PO TABS: 600.0000 mg | ORAL_TABLET | Freq: Three times a day (TID) | ORAL | Status: DC | PRN

## 2014-03-15 MED ORDER — ZOLPIDEM TARTRATE 5 MG PO TABS
5.0000 mg | ORAL_TABLET | Freq: Every evening | ORAL | Status: DC | PRN
Start: 2014-03-15 — End: 2014-03-15

## 2014-03-15 MED ORDER — ACETAMINOPHEN 325 MG PO TABS: 650.0000 mg | ORAL_TABLET | ORAL | Status: DC | PRN

## 2014-03-15 MED ORDER — GABAPENTIN 300 MG PO CAPS
300.0000 mg | ORAL_CAPSULE | Freq: Three times a day (TID) | ORAL | Status: DC
  Administered 2014-03-15: 300 mg via ORAL
  Filled 2014-03-15: qty 1

## 2014-03-15 MED ORDER — LISINOPRIL 10 MG PO TABS
5.0000 mg | ORAL_TABLET | Freq: Every day | ORAL | Status: DC
  Administered 2014-03-15: 5 mg via ORAL
  Filled 2014-03-15: qty 1

## 2014-03-15 MED ORDER — ALUM & MAG HYDROXIDE-SIMETH 200-200-20 MG/5ML PO SUSP: 30.0000 mL | ORAL | Status: DC | PRN

## 2014-03-15 NOTE — BH Assessment (Signed)
Gero psyc recommended. Sent referrals to: [address] [place], [place], [place], [place], [place], [place] Triage Specialist 03/15/2014 3:44 AM

## 2014-03-15 NOTE — ED Notes (Signed)
Spoke to [employer]. He advises he should be able to transport in a couple hours

## 2014-03-15 NOTE — ED Notes (Signed)
sher[facility]iff called to transport. He advises will be 45 minutes to an hour

## 2014-03-15 NOTE — ED Provider Notes (Signed)
I completed the first opinion paperwork on the patient.  On AM rounds she states that she is comfortable.  She has been accepted to [facility], by Dr. Arcola Jansky, MD 03/15/14 1120

## 2014-03-15 NOTE — BH Assessment (Signed)
[place] Assessment Progress Note   The following facilities were contacted in an attempt to place the pt:  [facility] beds per Pleas Koch at [facility] per Pamala Hurry at [facility] faxed for review [place]-no beds per Dyann Ruddle at [phone], call back [place]-call back at [place] referral again, didn't receive it per Deanna at [phone], referral faxed  [place]-no beds per Darlene at [place] answer at [phone], referral faxed for review [place] declined per Silva Bandy at [phone] due to pt acuity per Dr. Namon Cirri  Shaune Pascal, [place], [facility] Licensed Professional Counselor Therapeutic Triage Specialist [facility] Phone: [phone] Fax: [phone]

## 2014-03-15 NOTE — ED Notes (Signed)
Spoke to patient daughter [name]. She advises that she does not have health care poa. She has POA papers. She also states she "cant put my hands on them right now, i will  Have to look for them". States she did not bring them with her last night, she just told them she had the papers. explaned to her that we are moving her mother today to [facility] and they are requesting the poa papers. [name] advises she will look for them and get them to [place] at some point.

## 2014-03-15 NOTE — BH Assessment (Signed)
[place] Assessment Progress Note  Received call from [facility] from [place] at [facility] stating pt accepted there to Dr. Launa Grill to their [place] gero unit.  Nurse to nurse report can be called at [phone].  Updated pt's nurse, Anne Ng, and she is to notify EDP Vanita Panda and arrange transportation via [employer].  Updated TTS staff.  Shaune Pascal, MS, [facility] Licensed Professional Counselor Therapeutic Triage Specialist [facility] Phone: [phone] Fax: [phone]
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
## 2014-03-15 NOTE — ED Notes (Signed)
All belongings and valuables sent with pt
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
## 2014-03-15 NOTE — ED Notes (Signed)
[facility] has arrived to transport pt to [facility]

## 2014-04-24 ENCOUNTER — Encounter (HOSPITAL_COMMUNITY): Payer: Self-pay | Admitting: *Deleted

## 2014-04-24 ENCOUNTER — Emergency Department (HOSPITAL_COMMUNITY): Payer: Medicaid Other

## 2014-04-24 ENCOUNTER — Emergency Department (HOSPITAL_COMMUNITY)
Admission: EM | Admit: 2014-04-24 | Discharge: 2014-04-25 | Disposition: A | Discharge status: Home / Self Care | Payer: Medicaid Other | Attending: Emergency Medicine | Admitting: Emergency Medicine

## 2014-04-24 DIAGNOSIS — N9489 Other specified conditions associated with female genital organs and menstrual cycle: Secondary | ICD-10-CM | POA: Insufficient documentation

## 2014-04-24 DIAGNOSIS — E119 Type 2 diabetes mellitus without complications: Secondary | ICD-10-CM | POA: Diagnosis not present

## 2014-04-24 DIAGNOSIS — R1013 Epigastric pain: Secondary | ICD-10-CM | POA: Diagnosis present

## 2014-04-24 DIAGNOSIS — Z720 Tobacco use: Secondary | ICD-10-CM | POA: Insufficient documentation

## 2014-04-24 DIAGNOSIS — K922 Gastrointestinal hemorrhage, unspecified: Secondary | ICD-10-CM | POA: Diagnosis not present

## 2014-04-24 DIAGNOSIS — N838 Other noninflammatory disorders of ovary, fallopian tube and broad ligament: Secondary | ICD-10-CM

## 2014-04-24 DIAGNOSIS — I10 Essential (primary) hypertension: Secondary | ICD-10-CM | POA: Insufficient documentation

## 2014-04-24 DIAGNOSIS — F209 Schizophrenia, unspecified: Secondary | ICD-10-CM | POA: Insufficient documentation

## 2014-04-24 LAB — COMPREHENSIVE METABOLIC PANEL
ALBUMIN: 4.4 g/dL (ref 3.5–5.2)
ALK PHOS: 82 U/L (ref 39–117)
ALT: 23 U/L (ref 0–35)
AST: 24 U/L (ref 0–37)
Anion gap: 12 (ref 5–15)
BUN: 12 mg/dL (ref 6–23)
CO2: 22 mmol/L (ref 19–32)
Calcium: 9.6 mg/dL (ref 8.4–10.5)
Chloride: 107 mmol/L (ref 96–112)
Creatinine, Ser: 0.77 mg/dL (ref 0.50–1.10)
GFR calc Af Amer: >90 mL/min (ref >90)
GFR calc non Af Amer: 87 mL/min — ABNORMAL LOW (ref >90)
Glucose, Bld: 107 mg/dL — ABNORMAL HIGH (ref 70–99)
Potassium: 4 mmol/L (ref 3.5–5.1)
SODIUM: 141 mmol/L (ref 135–145)
Total Bilirubin: 0.6 mg/dL (ref 0.3–1.2)
Total Protein: 7.6 g/dL (ref 6.0–8.3)

## 2014-04-24 LAB — TYPE AND SCREEN
ABO/RH(D): O POS
Antibody Screen: NEGATIVE

## 2014-04-24 LAB — CBC
HCT: 45.5 % (ref 36.0–46.0)
Hemoglobin: 15.4 g/dL — ABNORMAL HIGH (ref 12.0–15.0)
MCH: 30.1 pg (ref 26.0–34.0)
MCHC: 33.8 g/dL (ref 30.0–36.0)
MCV: 89 fL (ref 78.0–100.0)
Platelets: 264 10*3/uL (ref 150–400)
RBC: 5.11 MIL/uL (ref 3.87–5.11)
RDW: 15.8 % — ABNORMAL HIGH (ref 11.5–15.5)
WBC: 9.3 10*3/uL (ref 4.0–10.5)

## 2014-04-24 LAB — I-STAT TROPONIN, ED: TROPONIN I, POC: 0 ng/mL (ref 0.00–0.08)

## 2014-04-24 LAB — POC OCCULT BLOOD, ED: FECAL OCCULT BLD: NEGATIVE

## 2014-04-24 LAB — ABO/RH: ABO/RH(D): O POS

## 2014-04-24 MED ORDER — GI COCKTAIL ~~LOC~~
30.0000 mL | Freq: Once | ORAL | Status: AC
  Administered 2014-04-24: 30 mL via ORAL
  Filled 2014-04-24: qty 30

## 2014-04-24 MED ORDER — IOHEXOL 300 MG/ML  SOLN
25.0000 mL | INTRAMUSCULAR | Status: AC
  Administered 2014-04-24: 25 mL via ORAL

## 2014-04-24 MED ORDER — IOHEXOL 300 MG/ML  SOLN
100.0000 mL | Freq: Once | INTRAMUSCULAR | Status: AC | PRN
  Administered 2014-04-24: 100 mL via INTRAVENOUS

## 2014-04-24 NOTE — ED Notes (Signed)
Patient transported to CT 

## 2014-04-24 NOTE — ED Notes (Signed)
Pt is here with upper abdominal pain since yesterday and vomited blood 2 times in small amounts.

## 2014-04-24 NOTE — ED Provider Notes (Signed)
CSN: [id number]     Arrival date & time 04/24/14  1431 History   First MD Initiated Contact with Patient 04/24/14 1937     Chief Complaint  Patient presents with  . Hematemesis  . Abdominal Pain     (Consider location/radiation/quality/duration/timing/severity/associated sxs/prior Treatment) Patient is a 64 y.o. female presenting with abdominal pain. The history is provided by the patient and a relative. No language interpreter was used.  Abdominal Pain Associated symptoms: no diarrhea, no dysuria and no hematuria   Ms. Wysong is a 64 y.o black female with a history of schizophrenia, DM, and htn who presents for abdominal pain since yesterday.  She vomited once yesterday without blood and she had one episode of hematemesis today.  She describes it as a half teaspoon of dark blood.  Nothing makes the pain better or worse.  She denies any fever, headache, dizziness, chest pain, shortness of breath, or urinary symptoms.  Her last bowel movement was yesterday without blood.  Her daughter states she was started on invega last month and is supposed to get monthly injections.   Past Medical History  Diagnosis Date  . Schizophrenia   . Diabetes mellitus   . Hypertension    History reviewed. No pertinent past surgical history. Family History  Problem Relation Age of Onset  . Diabetes Sister   . Heart disease Mother    History  Substance Use Topics  . Smoking status: Current Every Day Smoker -- 0.20 packs/day    Types: Cigarettes  . Smokeless tobacco: Not on file  . Alcohol Use: No   OB History    No data available     Review of Systems  Gastrointestinal: Positive for abdominal pain. Negative for diarrhea.  Genitourinary: Negative for dysuria, frequency, hematuria and difficulty urinating.  Neurological: Negative for syncope and light-headedness.  All other systems reviewed and are negative.     Allergies  Review of patient's allergies indicates no known allergies.  Home  Medications   Prior to Admission medications   Medication Sig Start Date End Date Taking? Authorizing Provider  brompheniramine-pseudoephedrine (DIMETAPP) 1-15 MG/5ML ELIX Take by mouth 2 (two) times daily as needed for allergies.    Historical Provider, MD  Cyanocobalamin (VITAMIN B-12 PO) Take by mouth daily.    Historical Provider, MD  gabapentin (NEURONTIN) 300 MG capsule Take 300 mg by mouth daily.     Historical Provider, MD  glucose blood test strip 1 each by Other route as needed for other. Use as instructed    Historical Provider, MD  Influenza Vac Typ A&B Surf Ant (FLUVIRIN) 0.5 ML SUSY Inject into the muscle.    Historical Provider, MD  lisinopril (PRINIVIL,ZESTRIL) 5 MG tablet Take 5 mg by mouth daily.    Historical Provider, MD  METFORMIN HCL PO Take 500 mg by mouth daily with breakfast.     Historical Provider, MD  mirtazapine (REMERON SOL-TAB) 30 MG disintegrating tablet Take 30 mg by mouth at bedtime as needed. For sleep    Historical Provider, MD  Multiple Vitamin (MULITIVITAMIN WITH MINERALS) TABS Take 1 tablet by mouth daily.    Historical Provider, MD  Paliperidone (INVEGA PO) Take 18 mg by mouth daily.     Historical Provider, MD  traZODone (DESYREL) 50 MG tablet Take 50 mg by mouth as needed.      Historical Provider, MD   BP 165/63 mmHg  Pulse 75  Temp(Src) 97.5 F (36.4 C) (Oral)  Resp 12  SpO2 99% Physical Exam  Constitutional: She is oriented to person, place, and time. She appears well-developed and well-nourished.  HENT:  Head: Normocephalic and atraumatic.  Mouth/Throat: Uvula is midline, oropharynx is clear and moist and mucous membranes are normal.  No dried blood around the lips.    Eyes: Conjunctivae and EOM are normal.  Neck: Normal range of motion. Neck supple.  Cardiovascular: Normal rate, regular rhythm and normal heart sounds.   Pulmonary/Chest: Effort normal and breath sounds normal.  Abdominal: Soft. Normal appearance. She exhibits no  distension. There is no rigidity, no rebound and no guarding.  Mild, diffuse abdominal tenderness. No focal tenderness.   Musculoskeletal: Normal range of motion.  Neurological: She is alert and oriented to person, place, and time.  Skin: Skin is warm and dry.  Nursing note and vitals reviewed.   ED Course  Procedures (including critical care time) Labs Review Labs Reviewed  CBC - Abnormal; Notable for the following:    Hemoglobin 15.4 (*)    RDW 15.8 (*)    All other components within normal limits  COMPREHENSIVE METABOLIC PANEL - Abnormal; Notable for the following:    Glucose, Bld 107 (*)    GFR calc non Af Amer 87 (*)    All other components within normal limits  I-STAT TROPOININ, ED  POC OCCULT BLOOD, ED  TYPE AND SCREEN  ABO/RH    Imaging Review Ct Abdomen Pelvis W Contrast  04/25/2014   CLINICAL DATA:  Upper abdominal pain since yesterday. Vomited blood twice.  EXAM: CT ABDOMEN AND PELVIS WITH CONTRAST  TECHNIQUE: Multidetector CT imaging of the abdomen and pelvis was performed using the standard protocol following bolus administration of intravenous contrast.  CONTRAST:  190mL OMNIPAQUE IOHEXOL 300 MG/ML  SOLN  COMPARISON:  MRCP 07/29/2011  FINDINGS: BODY WALL: No contributory findings.  LOWER CHEST: Trace bilateral pleural effusions.  ABDOMEN/PELVIS:  Liver: Indistinct subcapsular mass in the left liver measuring 12 mm, with partial fill-in on delayed imaging. When accounting for stability since 2013, this is most consistent with a hemangioma.  There is a lobulated cyst along the hepatic hilum measuring 21 mm. An additional sub cm cyst present in the right liver on image 23.  Hypervascular focus in the subcapsular left liver is not visible on delayed imaging and consistent with shunts.  1 cm hypervascular focus in the right liver on image 13 likely a flash fill hemangioma, stable from 2013.  Biliary: Cholelithiasis.  Pancreas: Unremarkable.  Spleen: Unremarkable.  Adrenals:  Unremarkable.  Kidneys and ureters: No hydronephrosis or stone. 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  Bladder: Unremarkable.  Reproductive: There is a 10 cm cystic mass indistinguishable from the right ovary with septation and nodular wall thickening posteriorly. The left ovary is also large for patient age, with small cysts.  Bowel: No obstruction. Negative appendix.  Retroperitoneum: No mass or adenopathy.  Peritoneum: No ascites or nodularity.  Vascular: No acute abnormality. Extensive atherosclerosis of the aorta and branch vessels.  OSSEOUS: No acute abnormalities.  IMPRESSION: 1. 10 cm cystic mass from the right ovary. Mural nodularity is concerning for a malignant epithelial tumor; no evidence of metastatic disease. 2. Cholelithiasis. 3. Hepatic hemangiomas and cysts.   Electronically Signed   By: Monte Fantasia M.D.   On: 04/25/2014 00:00     EKG Interpretation   Date/Time:  Monday April 24 2014 15:06:24 EDT Ventricular Rate:  79 PR Interval:  158 QRS Duration: 70 QT Interval:  326 QTC Calculation: 373 R Axis:  69 Text Interpretation:  Normal sinus rhythm Normal ECG Confirmed by HARRISON   MD, FORREST ([id number]) on 04/24/2014 8:14:40 PM      MDM   Final diagnoses:  Epigastric pain  Ovarian mass, right  Upper GI bleed   Patient presents for abdominal pain and an episode of dark hematemesis about half a teaspoon today. On exam she has mild, diffuse tenderness.  No focal tenderness.  No guarding or rebound.  No distention. She has no nausea or vomiting throughout her stay in the ED. She has had normal bowel movements without blood.  Her labs are unremarkable.  Negative fecal blood.  Her hgb is stable.  I discussed this patient with Dr. Aline Brochure who also saw the patient. He ordered a CT scan abd/pelvis which showed a 10 cm cystic mass on the right ovary concerning for malignant epithelial tumor but no evidence of metastatic disease. She also had cholelithiasis and  hepatic hemangiomas and cysts.  None of which would be concerning for her presentation today. Dr. Aline Brochure discussed CT findings with patient and daughter and the need for f/u for ovarian mass.  I gave her pcp referral and included findings in discharge paperwork and pcp can refer her to gyn. Discussed return precautions including continued bleeding or worsening abdominal pain.     Ottie Glazier, PA-C 04/25/14 0034  Pamella Pert, MD 04/25/14 [phone]

## 2014-04-25 NOTE — Discharge Instructions (Signed)
Gastrointestinal Bleeding Gastrointestinal (GI) bleeding means there is bleeding somewhere along the digestive tract, between the mouth and anus. CAUSES  There are many different problems that can cause GI bleeding. Possible causes include:  Esophagitis. This is inflammation, irritation, or swelling of the esophagus.  Hemorrhoids.These are veins that are full of blood (engorged) in the rectum. They cause pain, inflammation, and may bleed.  Anal fissures.These are areas of painful tearing which may bleed. They are often caused by passing hard stool.  Diverticulosis.These are pouches that form on the colon over time, with age, and may bleed significantly.  Diverticulitis.This is inflammation in areas with diverticulosis. It can cause pain, fever, and bloody stools, although bleeding is rare.  Polyps and cancer. Colon cancer often starts out as precancerous polyps.  Gastritis and ulcers.Bleeding from the upper gastrointestinal tract (near the stomach) may travel through the intestines and produce black, sometimes tarry, often bad smelling stools. In certain cases, if the bleeding is fast enough, the stools may not be black, but red. This condition may be life-threatening. SYMPTOMS   Vomiting bright red blood or material that looks like coffee grounds.  Bloody, black, or tarry stools. DIAGNOSIS  Your caregiver may diagnose your condition by taking your history and performing a physical exam. More tests may be needed, including:  X-rays and other imaging tests.  Esophagogastroduodenoscopy (EGD). This test uses a flexible, lighted tube to look at your esophagus, stomach, and small intestine.  Colonoscopy. This test uses a flexible, lighted tube to look at your colon. TREATMENT  Treatment depends on the cause of your bleeding.   For bleeding from the esophagus, stomach, small intestine, or colon, the caregiver doing your EGD or colonoscopy may be able to stop the bleeding as part of  the procedure.  Inflammation or infection of the colon can be treated with medicines.  Many rectal problems can be treated with creams, suppositories, or warm baths.  Surgery is sometimes needed.  Blood transfusions are sometimes needed if you have lost a lot of blood. If bleeding is slow, you may be allowed to go home. If there is a lot of bleeding, you will need to stay in the hospital for observation. HOME CARE INSTRUCTIONS   Take any medicines exactly as prescribed.  Keep your stools soft by eating foods that are high in fiber. These foods include whole grains, legumes, fruits, and vegetables. Prunes (1 to 3 a day) work well for many people.  Drink enough fluids to keep your urine clear or pale yellow. SEEK IMMEDIATE MEDICAL CARE IF:   Your bleeding increases.  You feel lightheaded, weak, or you faint.  You have severe cramps in your back or abdomen.  You pass large blood clots in your stool.  Your problems are getting worse. MAKE SURE YOU:   Understand these instructions.  Will watch your condition.  Will get help right away if you are not doing well or get worse. Document Released: 01/04/2000 Document Revised: 12/24/2011 Document Reviewed: 12/16/2010 [facility] Patient Information 2015 [place], [place]. This information is not intended to replace advice given to you by your health care provider. Make sure you discuss any questions you have with your health care provider.  CT abdomen/pelvis 1. 10 cm cystic mass from the right ovary. Mural nodularity is concerning for a malignant epithelial tumor; no evidence of metastatic disease. 2. Cholelithiasis. 3. Hepatic hemangiomas and cysts.

## 2014-06-20 ENCOUNTER — Encounter (HOSPITAL_COMMUNITY): Payer: Self-pay

## 2014-06-20 ENCOUNTER — Emergency Department (HOSPITAL_COMMUNITY)
Admission: EM | Admit: 2014-06-20 | Discharge: 2014-06-21 | Disposition: A | Discharge status: Home / Self Care | Payer: Medicaid Other | Attending: Emergency Medicine | Admitting: Emergency Medicine

## 2014-06-20 DIAGNOSIS — F209 Schizophrenia, unspecified: Secondary | ICD-10-CM | POA: Diagnosis not present

## 2014-06-20 DIAGNOSIS — I10 Essential (primary) hypertension: Secondary | ICD-10-CM | POA: Diagnosis not present

## 2014-06-20 DIAGNOSIS — Z720 Tobacco use: Secondary | ICD-10-CM | POA: Insufficient documentation

## 2014-06-20 DIAGNOSIS — F29 Unspecified psychosis not due to a substance or known physiological condition: Secondary | ICD-10-CM | POA: Insufficient documentation

## 2014-06-20 DIAGNOSIS — E119 Type 2 diabetes mellitus without complications: Secondary | ICD-10-CM | POA: Diagnosis not present

## 2014-06-20 DIAGNOSIS — F22 Delusional disorders: Secondary | ICD-10-CM | POA: Diagnosis not present

## 2014-06-20 DIAGNOSIS — Z79899 Other long term (current) drug therapy: Secondary | ICD-10-CM | POA: Diagnosis not present

## 2014-06-20 DIAGNOSIS — F203 Undifferentiated schizophrenia: Secondary | ICD-10-CM
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
## 2014-06-21 DIAGNOSIS — F203 Undifferentiated schizophrenia: Secondary | ICD-10-CM

## 2014-06-21 DIAGNOSIS — F29 Unspecified psychosis not due to a substance or known physiological condition: Secondary | ICD-10-CM | POA: Insufficient documentation

## 2014-06-21 DIAGNOSIS — F22 Delusional disorders: Secondary | ICD-10-CM | POA: Insufficient documentation

## 2014-06-21 LAB — COMPREHENSIVE METABOLIC PANEL
ALT: 22 U/L (ref 14–54)
ANION GAP: 10 (ref 5–15)
AST: 21 U/L (ref 15–41)
Albumin: 4.6 g/dL (ref 3.5–5.0)
Alkaline Phosphatase: 81 U/L (ref 38–126)
BILIRUBIN TOTAL: 0.4 mg/dL (ref 0.3–1.2)
BUN: 15 mg/dL (ref 6–20)
CALCIUM: 9.8 mg/dL (ref 8.9–10.3)
CO2: 25 mmol/L (ref 22–32)
CREATININE: 0.65 mg/dL (ref 0.44–1.00)
Chloride: 106 mmol/L (ref 101–111)
GFR calc Af Amer: >60 mL/min (ref >60)
Glucose, Bld: 109 mg/dL — ABNORMAL HIGH (ref 65–99)
Potassium: 3.5 mmol/L (ref 3.5–5.1)
Sodium: 141 mmol/L (ref 135–145)
TOTAL PROTEIN: 7.7 g/dL (ref 6.5–8.1)

## 2014-06-21 LAB — CBC
HCT: 42.7 % (ref 36.0–46.0)
HEMOGLOBIN: 14.2 g/dL (ref 12.0–15.0)
MCH: 30 pg (ref 26.0–34.0)
MCHC: 33.3 g/dL (ref 30.0–36.0)
MCV: 90.3 fL (ref 78.0–100.0)
PLATELETS: 294 10*3/uL (ref 150–400)
RBC: 4.73 MIL/uL (ref 3.87–5.11)
RDW: 15.5 % (ref 11.5–15.5)
WBC: 13.3 10*3/uL — ABNORMAL HIGH (ref 4.0–10.5)

## 2014-06-21 LAB — RAPID URINE DRUG SCREEN, HOSP PERFORMED
Amphetamines: NOT DETECTED
BARBITURATES: NOT DETECTED
Benzodiazepines: NOT DETECTED
Cocaine: NOT DETECTED
Opiates: NOT DETECTED
TETRAHYDROCANNABINOL: NOT DETECTED

## 2014-06-21 LAB — ETHANOL: Alcohol, Ethyl (B): <5 mg/dL (ref <5)

## 2014-06-21 LAB — ACETAMINOPHEN LEVEL

## 2014-06-21 LAB — SALICYLATE LEVEL

## 2014-06-21 MED ORDER — TRAZODONE HCL 50 MG PO TABS: 50.0000 mg | ORAL_TABLET | Freq: Every day | ORAL | Status: DC

## 2014-06-21 MED ORDER — PALIPERIDONE ER 6 MG PO TB24
18.0000 mg | ORAL_TABLET | Freq: Every day | ORAL | Status: DC
  Administered 2014-06-21: 18 mg via ORAL
  Filled 2014-06-21: qty 3

## 2014-06-21 MED ORDER — LISINOPRIL 5 MG PO TABS
5.0000 mg | ORAL_TABLET | Freq: Every day | ORAL | Status: DC
  Administered 2014-06-21: 5 mg via ORAL
  Filled 2014-06-21: qty 1

## 2014-06-21 MED ORDER — MIRTAZAPINE 30 MG PO TBDP
30.0000 mg | ORAL_TABLET | Freq: Every day | ORAL | Status: DC
Start: 2014-06-21 — End: 2014-06-21
  Filled 2014-06-21 (×2): qty 1

## 2014-06-21 MED ORDER — AMANTADINE HCL 100 MG PO CAPS
100.0000 mg | ORAL_CAPSULE | Freq: Once | ORAL | Status: AC
  Administered 2014-06-21: 100 mg via ORAL
  Filled 2014-06-21: qty 1

## 2014-06-21 MED ORDER — GABAPENTIN 300 MG PO CAPS
300.0000 mg | ORAL_CAPSULE | Freq: Every day | ORAL | Status: DC
  Administered 2014-06-21: 300 mg via ORAL
  Filled 2014-06-21: qty 1

## 2014-06-21 MED ORDER — METFORMIN HCL 500 MG PO TABS
500.0000 mg | ORAL_TABLET | Freq: Every day | ORAL | Status: DC
  Administered 2014-06-21: 500 mg via ORAL
  Filled 2014-06-21 (×2): qty 1

## 2014-06-21 NOTE — ED Notes (Signed)
Pt. and belongings wanded by security 

## 2014-06-21 NOTE — BH Assessment (Signed)
Inpt recommended. TTS seeking placement. Sent referrals to: [place], [place] Triage Specialist 06/21/2014 6:44 AM

## 2014-06-21 NOTE — ED Notes (Signed)
Reported abnormal jaw movements to NP and received verbal order for symmetrel.  Given as ordered. Safety maintained.
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
## 2014-06-21 NOTE — BH Assessment (Signed)
[place] Assessment Progress Note  At 12:40 I received a call from [place] at [facility].  Pt has been accepted to their facility by Rivka Barbara, MD to Rm 319.  Please call report to [phone].  Charmaine Downs, NP agrees with this decision.  Pt is under IVC and is to be transported via [facility].  Pt's nurse, Jan, has been notified.  Jalene Mullet, MA Triage Specialist [phone]
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
## 2014-06-21 NOTE — ED Provider Notes (Signed)
CSN: [id number]     Arrival date & time 06/20/14  2329 History   First MD Initiated Contact with Patient 06/21/14 [phone]     Chief Complaint  Patient presents with  . Schizophrenia      (Consider location/radiation/quality/duration/timing/severity/associated sxs/prior Treatment) HPI Comments: Patient brought in by police with IVC papers.  She has a history of schizophrenia daughter to get the paper saying that her mother is not taking care of herself.  She is being uncooperative.  She states her mother left the house, stating she was going to [place].  I suicidality, homicidality  The history is provided by the patient.    Past Medical History  Diagnosis Date  . Schizophrenia   . Diabetes mellitus   . Hypertension    History reviewed. No pertinent past surgical history. Family History  Problem Relation Age of Onset  . Diabetes Sister   . Heart disease Mother    History  Substance Use Topics  . Smoking status: Current Every Day Smoker -- 0.20 packs/day    Types: Cigarettes  . Smokeless tobacco: Not on file  . Alcohol Use: No   OB History    No data available     Review of Systems  Constitutional: Negative for fever.  Gastrointestinal: Negative for abdominal pain.  Neurological: Negative for headaches.  Psychiatric/Behavioral: Positive for confusion. Negative for suicidal ideas and self-injury.  All other systems reviewed and are negative.     Allergies  Review of patient's allergies indicates no known allergies.  Home Medications   Prior to Admission medications   Medication Sig Start Date End Date Taking? Authorizing Provider  brompheniramine-pseudoephedrine (DIMETAPP) 1-15 MG/5ML ELIX Take by mouth 2 (two) times daily as needed for allergies.    Historical Provider, MD  Cyanocobalamin (VITAMIN B-12 PO) Take by mouth daily.    Historical Provider, MD  gabapentin (NEURONTIN) 300 MG capsule Take 300 mg by mouth daily.     Historical Provider, MD  glucose blood  test strip 1 each by Other route as needed for other. Use as instructed    Historical Provider, MD  Influenza Vac Typ A&B Surf Ant (FLUVIRIN) 0.5 ML SUSY Inject into the muscle.    Historical Provider, MD  lisinopril (PRINIVIL,ZESTRIL) 5 MG tablet Take 5 mg by mouth daily.    Historical Provider, MD  METFORMIN HCL PO Take 500 mg by mouth daily with breakfast.     Historical Provider, MD  mirtazapine (REMERON SOL-TAB) 30 MG disintegrating tablet Take 30 mg by mouth at bedtime as needed. For sleep    Historical Provider, MD  Multiple Vitamin (MULITIVITAMIN WITH MINERALS) TABS Take 1 tablet by mouth daily.    Historical Provider, MD  Paliperidone (INVEGA PO) Take 18 mg by mouth daily.     Historical Provider, MD  traZODone (DESYREL) 50 MG tablet Take 50 mg by mouth as needed.      Historical Provider, MD   BP 125/74 mmHg  Pulse 111  Temp(Src) 98.1 F (36.7 C) (Oral)  Resp 16  SpO2 97% Physical Exam  Constitutional: She appears well-developed and well-nourished.  Eyes: Pupils are equal, round, and reactive to light.  Neck: Normal range of motion.  Cardiovascular: Normal rate.   Pulmonary/Chest: Effort normal.  Musculoskeletal: Normal range of motion.  Neurological: She is alert.  Psychiatric: Her speech is normal. Her affect is inappropriate. Thought content is delusional. She expresses inappropriate judgment.  Nursing note and vitals reviewed.   ED Course  Procedures (including critical care  time) Labs Review Labs Reviewed  ACETAMINOPHEN LEVEL  CBC  COMPREHENSIVE METABOLIC PANEL  ETHANOL  SALICYLATE LEVEL  URINE RAPID DRUG SCREEN (HOSP PERFORMED) NOT AT [facility]    Imaging Review No results found.   EKG Interpretation None     We'll move patient to the psych unit for evaluation MDM   Final diagnoses:  None         Junius Creamer, NP 06/21/14 7741  Varney Biles, MD 06/25/14 1523
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
## 2014-06-21 NOTE — ED Notes (Signed)
Pt requested her glasses. [employer] checked bags and did not locate glasses only shades. Pt reports that she may have left them at home.

## 2014-06-21 NOTE — ED Notes (Signed)
Reported abnormal jaw movements to MD and NP. NP to evaluate medications.
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
## 2014-09-05 ENCOUNTER — Emergency Department (HOSPITAL_COMMUNITY)
Admission: EM | Admit: 2014-09-05 | Discharge: 2014-09-05 | Disposition: A | Discharge status: Home / Self Care | Payer: Medicaid Other | Attending: Emergency Medicine | Admitting: Emergency Medicine

## 2014-09-05 ENCOUNTER — Encounter (HOSPITAL_COMMUNITY): Payer: Self-pay | Admitting: Emergency Medicine

## 2014-09-05 DIAGNOSIS — Z720 Tobacco use: Secondary | ICD-10-CM | POA: Insufficient documentation

## 2014-09-05 DIAGNOSIS — F209 Schizophrenia, unspecified: Secondary | ICD-10-CM | POA: Insufficient documentation

## 2014-09-05 DIAGNOSIS — Z79899 Other long term (current) drug therapy: Secondary | ICD-10-CM | POA: Diagnosis not present

## 2014-09-05 DIAGNOSIS — E119 Type 2 diabetes mellitus without complications: Secondary | ICD-10-CM | POA: Insufficient documentation

## 2014-09-05 DIAGNOSIS — K5900 Constipation, unspecified: Secondary | ICD-10-CM | POA: Insufficient documentation

## 2014-09-05 DIAGNOSIS — I10 Essential (primary) hypertension: Secondary | ICD-10-CM | POA: Diagnosis not present

## 2014-09-05 DIAGNOSIS — Z008 Encounter for other general examination: Secondary | ICD-10-CM | POA: Diagnosis present

## 2014-09-05 MED ORDER — INVEGA SUSTENNA 156 MG/ML IM SUSP: 156.0000 mg | INTRAMUSCULAR | Status: DC

## 2014-09-05 MED ORDER — PALIPERIDONE PALMITATE 156 MG/ML IM SUSP: 156.0000 mg | INTRAMUSCULAR | Status: DC

## 2014-09-05 NOTE — ED Notes (Signed)
MD at bedside. 

## 2014-09-05 NOTE — Discharge Instructions (Signed)
Schizophrenia °Schizophrenia is a mental illness. It may cause disturbed or disorganized thinking, speech, or behavior. People with schizophrenia have problems functioning in one or more areas of life: work, school, home, or relationships. People with schizophrenia are at increased risk for suicide, certain chronic physical illnesses, and unhealthy behaviors, such as smoking and drug use. °People who have family members with schizophrenia are at higher risk of developing the illness. Schizophrenia affects men and women equally but usually appears at an earlier age (teenage or early adult years) in men.  °SYMPTOMS °The earliest symptoms are often subtle (prodrome) and may go unnoticed until the illness becomes more severe (first-break psychosis). Symptoms of schizophrenia may be continuous or may come and go in severity. Episodes often are triggered by major life events, such as family stress, college, military service, marriage, pregnancy or child birth, divorce, or loss of a loved one. People with schizophrenia may see, hear, or feel things that do not exist (hallucinations). They may have false beliefs in spite of obvious proof to the contrary (delusions). Sometimes speech is incoherent or behavior is odd or withdrawn.  °DIAGNOSIS °Schizophrenia is diagnosed through an assessment by your caregiver. Your caregiver will ask questions about your thoughts, behavior, mood, and ability to function in daily life. Your caregiver may ask questions about your medical history and use of alcohol or drugs, including prescription medication. Your caregiver may also order blood tests and imaging exams. Certain medical conditions and substances can cause symptoms that resemble schizophrenia. Your caregiver may refer you to a mental health specialist for evaluation. There are three major criterion for a diagnosis of schizophrenia: °· Two or more of the following five symptoms are present for a month or longer: °¨ Delusions. Often  the delusions are that you are being attacked, harassed, cheated, persecuted or conspired against (persecutory delusions). °¨ Hallucinations.   °¨ Disorganized speech that does not make sense to others. °¨ Grossly disorganized (confused or unfocused) behavior or extremely overactive or underactive motor activity (catatonia). °¨ Negative symptoms such as bland or blunted emotions (flat affect), loss of will power (avolition), and withdrawal from social contacts (social isolation). °· Level of functioning in one or more major areas of life (work, school, relationships, or self-care) is markedly below the level of functioning before the onset of illness.   °· There are continuous signs of illness (either mild symptoms or decreased level of functioning) for at least 6 months or longer. °TREATMENT  °Schizophrenia is a long-term illness. It is best controlled with continuous treatment rather than treatment only when symptoms occur. The following treatments are used to manage schizophrenia: °· Medication--Medication is the most effective and important form of treatment for schizophrenia. Antipsychotic medications are usually prescribed to help manage schizophrenia. Other types of medication may be added to relieve any symptoms that may occur despite the use of antipsychotic medications. °· Counseling or talk therapy--Individual, group, or family counseling may be helpful in providing education, support, and guidance. Many people with schizophrenia also benefit from social skills and job skills (vocational) training. °A combination of medication and counseling is best for managing the disorder over time. A procedure in which electricity is applied to the brain through the scalp (electroconvulsive therapy) may be used to treat catatonic schizophrenia or schizophrenia in people who cannot take or do not respond to medication and counseling. °Document Released: 01/04/2000 Document Revised: 09/08/2012 Document Reviewed:  03/31/2012 °[employer]® Patient Information ©2015 [employer]. This information is not intended to replace advice given to you by   your health care provider. Make sure you discuss any questions you have with your health care provider. ° °

## 2014-09-05 NOTE — ED Notes (Signed)
Pt placed in gown and in bed. Pt monitored by pulse ox and bp cuff. 

## 2014-09-05 NOTE — ED Provider Notes (Addendum)
CSN: [id number]     Arrival date & time 09/05/14  0935 History   First MD Initiated Contact with Patient 09/05/14 [phone]     Chief Complaint  Patient presents with  . Medical Clearance   HPI Patient presents to the emergency room to get an injection of her psychiatric medication. The patient has a history of schizophrenia. She had been getting treatment at the psychiatrist's office that included monthly injections of INVEGA.  Patient was discharged from the psychiatry office for missing appointments. Patient's daughter has arranged follow-up with another psychiatrist but does not have the appointment yet. Daughter states the patient missed her last monthly injection.  She came here to the emergency room to get the injection before she starts having trouble with her psychosis again. Currently the patient is not having any acute issues. Past Medical History  Diagnosis Date  . Schizophrenia   . Diabetes mellitus   . Hypertension    History reviewed. No pertinent past surgical history. Family History  Problem Relation Age of Onset  . Diabetes Sister   . Heart disease Mother    Social History  Substance Use Topics  . Smoking status: Current Every Day Smoker -- 0.20 packs/day    Types: Cigarettes  . Smokeless tobacco: None  . Alcohol Use: No   OB History    No data available     Review of Systems  Gastrointestinal: Positive for constipation.  All other systems reviewed and are negative.     Allergies  Review of patient's allergies indicates no known allergies.  Home Medications   Prior to Admission medications   Medication Sig Start Date End Date Taking? Authorizing Provider  Cyanocobalamin (VITAMIN B-12 PO) Take 1,000 mcg by mouth daily.     Historical Provider, MD  gabapentin (NEURONTIN) 300 MG capsule Take 300 mg by mouth daily.     Historical Provider, MD  lisinopril (PRINIVIL,ZESTRIL) 5 MG tablet Take 5 mg by mouth daily.    Historical Provider, MD  METFORMIN HCL PO Take  500 mg by mouth daily with breakfast.     Historical Provider, MD  Multiple Vitamin (MULITIVITAMIN WITH MINERALS) TABS Take 1 tablet by mouth daily.    Historical Provider, MD  Paliperidone Palmitate (INVEGA SUSTENNA IM) Inject 1 Syringe into the muscle every morning.    Historical Provider, MD  traZODone (DESYREL) 50 MG tablet Take 50 mg by mouth at bedtime.     Historical Provider, MD   BP 135/76 mmHg  Pulse 77  Temp(Src) 98.2 F (36.8 C) (Oral)  Resp 16  SpO2 99% Physical Exam  Constitutional: She appears well-developed and well-nourished. No distress.  HENT:  Head: Normocephalic and atraumatic.  Right Ear: External ear normal.  Left Ear: External ear normal.  Eyes: Conjunctivae are normal. Right eye exhibits no discharge. Left eye exhibits no discharge. No scleral icterus.  Neck: Neck supple. No tracheal deviation present.  Cardiovascular: Normal rate, regular rhythm and intact distal pulses.   Pulmonary/Chest: Effort normal and breath sounds normal. No stridor. No respiratory distress. She has no wheezes. She has no rales.  Abdominal: Soft. Bowel sounds are normal. She exhibits no distension. There is no tenderness. There is no rebound and no guarding.  Musculoskeletal: She exhibits no edema or tenderness.  Neurological: She is alert. She has normal strength. No cranial nerve deficit (no facial droop, extraocular movements intact, no slurred speech) or sensory deficit. She exhibits normal muscle tone. She displays no seizure activity. Coordination normal.  Skin: Skin  is warm and dry. No rash noted.  Psychiatric: Her affect is blunt. Her affect is not angry, not labile and not inappropriate. Her speech is not rapid and/or pressured and not delayed. She is not withdrawn and not actively hallucinating. She does not exhibit a depressed mood. She expresses no homicidal and no suicidal ideation.  Nursing note and vitals reviewed.   ED Course  Procedures (including critical care  time)   MDM   Final diagnoses:  Schizophrenia, unspecified type   Pt is not having any acute issues.  Will ask pharmacy to assist in determining the dose the patient was receiving. Will give her, her injection in the ED until she can see the psychiatrist as planned.  Pharmacy provided additional information.  [facility] does not carry the drug.  We are unable to give the patient the medication.  Previously the patient was picking up the prescription and her psychiatrist was giving the injection.  Will ask patient to contact PCP and her new psychiatrist.  I can prescribe a dose of the med and they might possibly be able to give the injection.    Dorie Rank, MD 09/05/14 1130  I spoke with the patient's daughter further. They already tried to contact the primary care doctor's office. They will not give the patient her medication. I told the patient I will be working in the emergency room tomorrow. It is not typical for [place] to administer these outpatient injections in the emergency room part but I think it's reasonable for her to prevent any further complications of her not taking her psychiatric medications. Does understand that unfortunately there will be an additional charge to return to the emergency department.  Dorie Rank, MD 09/05/14 [phone]

## 2014-09-05 NOTE — ED Notes (Signed)
Pt here for her shot of Invega bc she can not go back to her phychiatric doctor due to missing two appointments. Pt has hx of schizophrenia. PT alert x4. NAD at this time.

## 2014-09-06 ENCOUNTER — Emergency Department (HOSPITAL_COMMUNITY)
Admission: EM | Admit: 2014-09-06 | Discharge: 2014-09-06 | Disposition: A | Discharge status: Home / Self Care | Payer: Medicaid Other | Attending: Emergency Medicine | Admitting: Emergency Medicine

## 2014-09-06 ENCOUNTER — Encounter (HOSPITAL_COMMUNITY): Payer: Self-pay | Admitting: *Deleted

## 2014-09-06 DIAGNOSIS — E119 Type 2 diabetes mellitus without complications: Secondary | ICD-10-CM | POA: Diagnosis not present

## 2014-09-06 DIAGNOSIS — Z760 Encounter for issue of repeat prescription: Secondary | ICD-10-CM | POA: Insufficient documentation

## 2014-09-06 DIAGNOSIS — F209 Schizophrenia, unspecified: Secondary | ICD-10-CM | POA: Insufficient documentation

## 2014-09-06 DIAGNOSIS — Z79899 Other long term (current) drug therapy: Secondary | ICD-10-CM | POA: Diagnosis not present

## 2014-09-06 DIAGNOSIS — Z720 Tobacco use: Secondary | ICD-10-CM | POA: Insufficient documentation

## 2014-09-06 DIAGNOSIS — I10 Essential (primary) hypertension: Secondary | ICD-10-CM | POA: Diagnosis not present

## 2014-09-06 NOTE — ED Notes (Signed)
Injection invega sustenna to Rt gluteal region. Tol well.

## 2014-09-06 NOTE — ED Notes (Signed)
Observe Pt for 30 mins post injection.

## 2014-09-06 NOTE — ED Notes (Signed)
Pt presents to ER for Invega sustenna injection. Pt brought Medication with her.

## 2014-09-06 NOTE — ED Provider Notes (Signed)
CSN: [id number]     Arrival date & time 09/06/14  7510 History  This chart was scribed for non-physician practitioner, Domenic Moras, PA-C, working with Noemi Chapel, MD by Ladene Artist, ED Scribe. This patient was seen in room TR10C/TR10C and the patient's care was started at 10:35 AM.   Chief Complaint  Patient presents with  . Injections    Invega sustenna   The history is provided by the patient. No language interpreter was used.   HPI Comments: Kristen Colon is a 64 y.o. female, with a h/o DM, HTN and schizophrenia, who presents to the Emergency Department for an injection of Invega sustenna to treat schizophrenia. Pt states that she has been receiving injections at her psychiatrist's office by the 4th of each month but she missed this month. She states that her psychiatrist is aware of this. Pt has taken Invega PO in the past but switched to injection 5 years ago. She was seen in the ED yesterday for injection but the hospital did not have her medication at that time; medication was transferred from another hospital. She denies visual or auditory hallucinations, SI/HI, SOB and difficulty sleeping.   Past Medical History  Diagnosis Date  . Schizophrenia   . Diabetes mellitus   . Hypertension    No past surgical history on file. Family History  Problem Relation Age of Onset  . Diabetes Sister   . Heart disease Mother    Social History  Substance Use Topics  . Smoking status: Current Every Day Smoker -- 0.20 packs/day    Types: Cigarettes  . Smokeless tobacco: Not on file  . Alcohol Use: No   OB History    No data available     Review of Systems  Respiratory: Negative for shortness of breath.   Psychiatric/Behavioral: Negative for suicidal ideas, hallucinations and sleep disturbance.   Allergies  Review of patient's allergies indicates no known allergies.  Home Medications   Prior to Admission medications   Medication Sig Start Date End Date Taking? Authorizing  Provider  Cyanocobalamin (VITAMIN B-12 PO) Take 1,000 mcg by mouth daily.     Historical Provider, MD  escitalopram (LEXAPRO) 10 MG tablet Take 1 tablet by mouth daily. 07/14/14   Historical Provider, MD  gabapentin (NEURONTIN) 300 MG capsule Take 300 mg by mouth daily.     Historical Provider, MD  INVEGA SUSTENNA 156 MG/ML SUSP injection Inject 1 mL (156 mg total) into the muscle every 30 (thirty) days. 09/05/14   Dorie Rank, MD  lisinopril (PRINIVIL,ZESTRIL) 5 MG tablet Take 5 mg by mouth daily.    Historical Provider, MD  metFORMIN (GLUCOPHAGE) 500 MG tablet Take 500 mg by mouth daily with breakfast.     Historical Provider, MD  mirtazapine (REMERON) 30 MG tablet Take 1 tablet by mouth every evening. 07/14/14   Historical Provider, MD  Multiple Vitamin (MULITIVITAMIN WITH MINERALS) TABS Take 1 tablet by mouth daily.    Historical Provider, MD  traZODone (DESYREL) 100 MG tablet Take 1 tablet by mouth daily. 08/11/14   Historical Provider, MD   BP 122/84 mmHg  Pulse 86  Temp(Src) 97.8 F (36.6 C) (Oral)  Resp 16  Ht 5\' 5"  (1.651 m)  Wt 121 lb (54.885 kg)  BMI 20.14 kg/m2  SpO2 100% Physical Exam  Constitutional: She is oriented to person, place, and time. She appears well-developed and well-nourished. No distress.  HENT:  Head: Normocephalic and atraumatic.  Eyes: Conjunctivae and EOM are normal.  Neck: Neck  supple. No tracheal deviation present.  Cardiovascular: Normal rate, regular rhythm and normal heart sounds.   Pulmonary/Chest: Effort normal and breath sounds normal. No respiratory distress.  Abdominal: Soft. There is no tenderness.  Musculoskeletal: Normal range of motion.  Neurological: She is alert and oriented to person, place, and time.  Skin: Skin is warm and dry.  Psychiatric: She has a normal mood and affect. Her behavior is normal.  Nursing note and vitals reviewed.  ED Course  Procedures (including critical care time) DIAGNOSTIC STUDIES: Oxygen Saturation is 100% on  RA, normal by my interpretation.    COORDINATION OF CARE: 10:38 AM-Discussed treatment plan which includes Invega sustenna injection with pt at bedside and pt agreed to plan.   Labs Review Labs Reviewed - No data to display  Imaging Review No results found. I have personally reviewed and evaluated these images and lab results as part of my medical decision-making.   EKG Interpretation None      MDM   Final diagnoses:  Encounter for medication refill    BP 122/84 mmHg  Pulse 86  Temp(Src) 97.8 F (36.6 C) (Oral)  Resp 16  Ht 5\' 5"  (1.651 m)  Wt 121 lb (54.885 kg)  BMI 20.14 kg/m2  SpO2 100%  I personally performed the services described in this documentation, which was scribed in my presence. The recorded information has been reviewed and is accurate.     Domenic Moras, PA-C 09/06/14 [place], MD 09/06/14 2239

## 2014-09-06 NOTE — Discharge Instructions (Signed)
Please follow up with your doctor for further management of your condition.  Medication Refill, Emergency Department We have refilled your medication today as a courtesy to you. It is best for your medical care, however, to take care of getting refills done through your primary caregiver's office. They have your records and can do a better job of follow-up than we can in the emergency department. On maintenance medications, we often only prescribe enough medications to get you by until you are able to see your regular caregiver. This is a more expensive way to refill medications. In the future, please plan for refills so that you will not have to use the emergency department for this. Thank you for your help. Your help allows [place] to better take care of the daily emergencies that enter our department. Document Released: 04/25/2003 Document Revised: 03/31/2011 Document Reviewed: 04/15/2013 [facility] Patient Information 2015 [place], [place]. This information is not intended to replace advice given to you by your health care provider. Make sure you discuss any questions you have with your health care provider.

## 2014-10-23 ENCOUNTER — Encounter (HOSPITAL_COMMUNITY): Payer: Self-pay | Admitting: Emergency Medicine

## 2014-10-23 ENCOUNTER — Emergency Department (HOSPITAL_COMMUNITY)
Admission: EM | Admit: 2014-10-23 | Discharge: 2014-10-24 | Payer: Medicare Other | Attending: Emergency Medicine | Admitting: Emergency Medicine

## 2014-10-23 DIAGNOSIS — I10 Essential (primary) hypertension: Secondary | ICD-10-CM | POA: Diagnosis not present

## 2014-10-23 DIAGNOSIS — E119 Type 2 diabetes mellitus without complications: Secondary | ICD-10-CM | POA: Diagnosis not present

## 2014-10-23 DIAGNOSIS — F203 Undifferentiated schizophrenia: Secondary | ICD-10-CM | POA: Diagnosis not present

## 2014-10-23 DIAGNOSIS — Z79899 Other long term (current) drug therapy: Secondary | ICD-10-CM | POA: Diagnosis not present

## 2014-10-23 DIAGNOSIS — Z720 Tobacco use: Secondary | ICD-10-CM | POA: Insufficient documentation

## 2014-10-23 DIAGNOSIS — R443 Hallucinations, unspecified: Secondary | ICD-10-CM | POA: Diagnosis present

## 2014-10-23 LAB — BASIC METABOLIC PANEL
Anion gap: 8 (ref 5–15)
BUN: 7 mg/dL (ref 6–20)
CHLORIDE: 108 mmol/L (ref 101–111)
CO2: 23 mmol/L (ref 22–32)
Calcium: 9 mg/dL (ref 8.9–10.3)
Creatinine, Ser: 0.62 mg/dL (ref 0.44–1.00)
GFR calc Af Amer: >60 mL/min (ref >60)
GFR calc non Af Amer: >60 mL/min (ref >60)
Glucose, Bld: 148 mg/dL — ABNORMAL HIGH (ref 65–99)
POTASSIUM: 3.8 mmol/L (ref 3.5–5.1)
SODIUM: 139 mmol/L (ref 135–145)

## 2014-10-23 LAB — CBC
HEMATOCRIT: 41.4 % (ref 36.0–46.0)
HEMOGLOBIN: 13.8 g/dL (ref 12.0–15.0)
MCH: 29.2 pg (ref 26.0–34.0)
MCHC: 33.3 g/dL (ref 30.0–36.0)
MCV: 87.7 fL (ref 78.0–100.0)
Platelets: 275 10*3/uL (ref 150–400)
RBC: 4.72 MIL/uL (ref 3.87–5.11)
RDW: 15.4 % (ref 11.5–15.5)
WBC: 7.8 10*3/uL (ref 4.0–10.5)

## 2014-10-23 LAB — RAPID URINE DRUG SCREEN, HOSP PERFORMED
AMPHETAMINES: NOT DETECTED
BENZODIAZEPINES: NOT DETECTED
Barbiturates: NOT DETECTED
Cocaine: NOT DETECTED
Opiates: NOT DETECTED
Tetrahydrocannabinol: NOT DETECTED

## 2014-10-23 LAB — ETHANOL: Alcohol, Ethyl (B): <5 mg/dL (ref <5)

## 2014-10-23 LAB — URINALYSIS, ROUTINE W REFLEX MICROSCOPIC
Bilirubin Urine: NEGATIVE
GLUCOSE, UA: 100 mg/dL — AB
HGB URINE DIPSTICK: NEGATIVE
Ketones, ur: NEGATIVE mg/dL
Leukocytes, UA: NEGATIVE
Nitrite: NEGATIVE
PROTEIN: NEGATIVE mg/dL
SPECIFIC GRAVITY, URINE: 1.009 (ref 1.005–1.030)
Urobilinogen, UA: 0.2 mg/dL (ref 0.0–1.0)
pH: 6.5 (ref 5.0–8.0)

## 2014-10-23 MED ORDER — ZIPRASIDONE MESYLATE 20 MG IM SOLR
INTRAMUSCULAR | Status: AC
  Filled 2014-10-23: qty 20

## 2014-10-23 MED ORDER — MIRTAZAPINE 30 MG PO TABS
30.0000 mg | ORAL_TABLET | Freq: Every evening | ORAL | Status: DC
  Administered 2014-10-23 – 2014-10-24 (×2): 30 mg via ORAL
  Filled 2014-10-23 (×2): qty 1

## 2014-10-23 MED ORDER — LISINOPRIL 5 MG PO TABS
5.0000 mg | ORAL_TABLET | Freq: Every day | ORAL | Status: DC
  Administered 2014-10-23 – 2014-10-24 (×2): 5 mg via ORAL
  Filled 2014-10-23 (×2): qty 1

## 2014-10-23 MED ORDER — ZIPRASIDONE MESYLATE 20 MG IM SOLR
20.0000 mg | Freq: Once | INTRAMUSCULAR | Status: AC
  Administered 2014-10-23: 20 mg via INTRAMUSCULAR

## 2014-10-23 MED ORDER — TRAZODONE HCL 100 MG PO TABS: 100.0000 mg | ORAL_TABLET | Freq: Every day | ORAL | Status: DC

## 2014-10-23 MED ORDER — LORAZEPAM 2 MG/ML IJ SOLN
1.0000 mg | Freq: Once | INTRAMUSCULAR | Status: AC
  Administered 2014-10-23: 1 mg via INTRAMUSCULAR
  Filled 2014-10-23: qty 1

## 2014-10-23 MED ORDER — ZIPRASIDONE MESYLATE 20 MG IM SOLR: 20.0000 mg | Freq: Once | INTRAMUSCULAR | Status: DC

## 2014-10-23 MED ORDER — STERILE WATER FOR INJECTION IJ SOLN
INTRAMUSCULAR | Status: AC
  Administered 2014-10-23: 11:00:00
  Filled 2014-10-23: qty 10

## 2014-10-23 MED ORDER — METFORMIN HCL 500 MG PO TABS
500.0000 mg | ORAL_TABLET | Freq: Every day | ORAL | Status: DC
  Administered 2014-10-24: 500 mg via ORAL
  Filled 2014-10-23 (×2): qty 1

## 2014-10-23 MED ORDER — GABAPENTIN 300 MG PO CAPS
300.0000 mg | ORAL_CAPSULE | Freq: Every day | ORAL | Status: DC
  Administered 2014-10-23 – 2014-10-24 (×2): 300 mg via ORAL
  Filled 2014-10-23 (×2): qty 1

## 2014-10-23 MED ORDER — ESCITALOPRAM OXALATE 10 MG PO TABS
10.0000 mg | ORAL_TABLET | Freq: Every day | ORAL | Status: DC
  Administered 2014-10-23 – 2014-10-24 (×2): 10 mg via ORAL
  Filled 2014-10-23 (×2): qty 1
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
## 2014-10-23 NOTE — BH Assessment (Signed)
Per Theodoro Clock, DNP - patient will be re-assessed in the morning.
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
## 2014-10-23 NOTE — ED Notes (Addendum)
Pt BIB daughter.  Daughter states that pt has been delusional x 2 wks.  Switched doctors recently.  Has paranoid schizophrenia.  Pt states "I was in [place] and there was a bunny rabbit doing a funny dance so I've been peeing and pooping fine."  Pt states that she wants to kill everyone.  Daughter would also like her checked for UTI.  Denies SI.  Also c/o mid abd pain since yesterday.

## 2014-10-23 NOTE — ED Notes (Signed)
Bed: [facility] Expected date:  Expected time:  Means of arrival:  Comments: RM 12

## 2014-10-23 NOTE — ED Notes (Signed)
Pt dressed out and provided urine sample. Family member at bedside with pt

## 2014-10-23 NOTE — ED Notes (Signed)
1 pt belonging bag at nurses station near room 25
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
## 2014-10-23 NOTE — ED Notes (Signed)
Pt allowed nurse to give ativan injection in left deltoid. Pt is not calmly sitting at end of bed. Pt now moved to room 12

## 2014-10-23 NOTE — ED Notes (Signed)
Before administering geodon, md at bedside, md confirmed he is IVC pt.

## 2014-10-23 NOTE — BH Assessment (Signed)
TTS (Arlesia Kiel) assessed the patient.

## 2014-10-23 NOTE — ED Notes (Signed)
Pt standing in front of computer, lassiter rn went into room to check on pt. Pt no longer cursing at staff.

## 2014-10-23 NOTE — ED Notes (Signed)
Patient is delusional thinking that someone in the past has cut her penis off.  She said she wonders why the nurses and doctors weren't on there game then and now want to give her medicine that they don't know what its for.  She is calm and cooperative but is very guarded

## 2014-10-23 NOTE — ED Notes (Signed)
Pt oriented to room and unit.  Denies SI, HI, and AVH.   Video monitoring and 15 minute checks in place,

## 2014-10-24 DIAGNOSIS — F258 Other schizoaffective disorders: Secondary | ICD-10-CM

## 2014-10-24 DIAGNOSIS — F259 Schizoaffective disorder, unspecified: Secondary | ICD-10-CM | POA: Insufficient documentation

## 2014-10-24 DIAGNOSIS — F203 Undifferentiated schizophrenia: Secondary | ICD-10-CM | POA: Diagnosis not present

## 2014-10-24 MED ORDER — ZIPRASIDONE MESYLATE 20 MG IM SOLR: 10.0000 mg | Freq: Once | INTRAMUSCULAR | Status: DC

## 2014-10-24 MED ORDER — TRAZODONE HCL 100 MG PO TABS: 100.0000 mg | ORAL_TABLET | Freq: Every day | ORAL | Status: DC
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
## 2014-10-24 NOTE — Progress Notes (Signed)
Pt is very confused, paranoid and delusional; unable to state day, time and place. Pt who is 64 y/o thinks some of the staffs are people she knew when she was still a child. Pt is a moderate fall risk, able to say "NO" when asked about SI/HI. Support, encouragement, and safe environment provided. Will continue to monitor for safety via security cameras and Q 15 minute checks.

## 2014-10-24 NOTE — ED Notes (Signed)
Pt. Daughter called to leave contact numbers for collateral information.  [name] O'Conners [phone]  [phone].  Business phone [phone].

## 2014-10-24 NOTE — ED Notes (Signed)
Report received from [place], [place] at [phone]. Pt has been in bed resting. Pt do not look to be in any distress at this time. Breaths are even and unlabored. Environment is secured. Will continue to monitor for safety via security cameras and Q 15 minute checks.

## 2014-10-24 NOTE — BH Assessment (Signed)
[facility] Assessment Progress Note  Per Donnelly Angelica, MD, this pt requires psychiatric hospitalization at this time.  At 14:52 Angela Nevin calls from [facility] to report that Dr. Jake Samples has agreed to accept pt to their facility provided pt is placed under IVC.  EDP Jola Schmidt, MD has initiated IVC, and petition has been faxed to [employer] who acknowledges receipt at 16:01.  Charmaine Downs, NP, agrees with this disposition.  Please call report to [phone].  Pt's nurse, Nena Jordan, has been notified.  Jalene Mullet, [facility] Triage Specialist [phone]

## 2014-12-26 ENCOUNTER — Emergency Department (HOSPITAL_COMMUNITY): Payer: No Typology Code available for payment source

## 2014-12-26 ENCOUNTER — Emergency Department (HOSPITAL_COMMUNITY)
Admission: EM | Admit: 2014-12-26 | Discharge: 2014-12-26 | Disposition: A | Discharge status: Home / Self Care | Payer: No Typology Code available for payment source | Attending: Emergency Medicine | Admitting: Emergency Medicine

## 2014-12-26 ENCOUNTER — Encounter (HOSPITAL_COMMUNITY): Payer: Self-pay | Admitting: Emergency Medicine

## 2014-12-26 DIAGNOSIS — S199XXA Unspecified injury of neck, initial encounter: Secondary | ICD-10-CM | POA: Insufficient documentation

## 2014-12-26 DIAGNOSIS — Z79899 Other long term (current) drug therapy: Secondary | ICD-10-CM | POA: Diagnosis not present

## 2014-12-26 DIAGNOSIS — Z7984 Long term (current) use of oral hypoglycemic drugs: Secondary | ICD-10-CM | POA: Insufficient documentation

## 2014-12-26 DIAGNOSIS — S3992XA Unspecified injury of lower back, initial encounter: Secondary | ICD-10-CM | POA: Diagnosis not present

## 2014-12-26 DIAGNOSIS — Y92410 Unspecified street and highway as the place of occurrence of the external cause: Secondary | ICD-10-CM | POA: Diagnosis not present

## 2014-12-26 DIAGNOSIS — Y9389 Activity, other specified: Secondary | ICD-10-CM | POA: Diagnosis not present

## 2014-12-26 DIAGNOSIS — F17210 Nicotine dependence, cigarettes, uncomplicated: Secondary | ICD-10-CM | POA: Diagnosis not present

## 2014-12-26 DIAGNOSIS — F209 Schizophrenia, unspecified: Secondary | ICD-10-CM | POA: Insufficient documentation

## 2014-12-26 DIAGNOSIS — I10 Essential (primary) hypertension: Secondary | ICD-10-CM | POA: Diagnosis not present

## 2014-12-26 DIAGNOSIS — S29002A Unspecified injury of muscle and tendon of back wall of thorax, initial encounter: Secondary | ICD-10-CM | POA: Insufficient documentation

## 2014-12-26 DIAGNOSIS — E119 Type 2 diabetes mellitus without complications: Secondary | ICD-10-CM | POA: Diagnosis not present

## 2014-12-26 DIAGNOSIS — S0990XA Unspecified injury of head, initial encounter: Secondary | ICD-10-CM | POA: Diagnosis not present

## 2014-12-26 DIAGNOSIS — Y998 Other external cause status: Secondary | ICD-10-CM | POA: Insufficient documentation

## 2014-12-26 DIAGNOSIS — I6529 Occlusion and stenosis of unspecified carotid artery: Secondary | ICD-10-CM | POA: Diagnosis not present

## 2014-12-26 MED ORDER — NAPROXEN 500 MG PO TABS: 500.0000 mg | ORAL_TABLET | Freq: Two times a day (BID) | ORAL | Status: DC

## 2014-12-26 NOTE — ED Notes (Signed)
Bed: WA19 Expected date:  Expected time:  Means of arrival:  Comments: 
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
## 2014-12-26 NOTE — Discharge Instructions (Signed)
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
## 2015-01-29 ENCOUNTER — Other Ambulatory Visit: Payer: Self-pay | Admitting: Gastroenterology

## 2015-01-29 DIAGNOSIS — R1084 Generalized abdominal pain: Secondary | ICD-10-CM

## 2015-02-06 ENCOUNTER — Inpatient Hospital Stay: Admission: RE | Admit: 2015-02-06 | Payer: Self-pay | Source: Ambulatory Visit

## 2016-08-21 ENCOUNTER — Encounter (HOSPITAL_COMMUNITY): Payer: Self-pay | Admitting: Emergency Medicine

## 2016-08-21 ENCOUNTER — Emergency Department (HOSPITAL_COMMUNITY)
Admission: EM | Admit: 2016-08-21 | Discharge: 2016-08-23 | Disposition: A | Discharge status: Other Acute Inpatient Hospital | Payer: Medicare Other | Attending: Emergency Medicine | Admitting: Emergency Medicine

## 2016-08-21 DIAGNOSIS — F1010 Alcohol abuse, uncomplicated: Secondary | ICD-10-CM

## 2016-08-21 DIAGNOSIS — E119 Type 2 diabetes mellitus without complications: Secondary | ICD-10-CM | POA: Diagnosis not present

## 2016-08-21 DIAGNOSIS — Z7984 Long term (current) use of oral hypoglycemic drugs: Secondary | ICD-10-CM | POA: Insufficient documentation

## 2016-08-21 DIAGNOSIS — Z79899 Other long term (current) drug therapy: Secondary | ICD-10-CM | POA: Insufficient documentation

## 2016-08-21 DIAGNOSIS — F17210 Nicotine dependence, cigarettes, uncomplicated: Secondary | ICD-10-CM | POA: Insufficient documentation

## 2016-08-21 DIAGNOSIS — F2089 Other schizophrenia: Secondary | ICD-10-CM | POA: Diagnosis not present

## 2016-08-21 DIAGNOSIS — I10 Essential (primary) hypertension: Secondary | ICD-10-CM | POA: Diagnosis not present

## 2016-08-21 DIAGNOSIS — F200 Paranoid schizophrenia: Secondary | ICD-10-CM | POA: Diagnosis not present

## 2016-08-21 DIAGNOSIS — F332 Major depressive disorder, recurrent severe without psychotic features: Secondary | ICD-10-CM | POA: Diagnosis not present

## 2016-08-21 LAB — COMPREHENSIVE METABOLIC PANEL
ALBUMIN: 3.9 g/dL (ref 3.5–5.0)
ALK PHOS: 74 U/L (ref 38–126)
ALT: 23 U/L (ref 14–54)
ANION GAP: 11 (ref 5–15)
AST: 17 U/L (ref 15–41)
BUN: 11 mg/dL (ref 6–20)
CALCIUM: 9.3 mg/dL (ref 8.9–10.3)
CO2: 24 mmol/L (ref 22–32)
Chloride: 98 mmol/L — ABNORMAL LOW (ref 101–111)
Creatinine, Ser: 0.79 mg/dL (ref 0.44–1.00)
GFR calc Af Amer: >60 mL/min (ref >60)
GFR calc non Af Amer: >60 mL/min (ref >60)
GLUCOSE: 293 mg/dL — AB (ref 65–99)
Potassium: 3.7 mmol/L (ref 3.5–5.1)
SODIUM: 133 mmol/L — AB (ref 135–145)
Total Bilirubin: 0.3 mg/dL (ref 0.3–1.2)
Total Protein: 7.2 g/dL (ref 6.5–8.1)

## 2016-08-21 LAB — RAPID URINE DRUG SCREEN, HOSP PERFORMED
AMPHETAMINES: NOT DETECTED
BARBITURATES: NOT DETECTED
Benzodiazepines: POSITIVE — AB
Cocaine: NOT DETECTED
Opiates: NOT DETECTED
Tetrahydrocannabinol: NOT DETECTED

## 2016-08-21 LAB — ETHANOL: Alcohol, Ethyl (B): 24 mg/dL — ABNORMAL HIGH (ref <5)

## 2016-08-21 LAB — CBC
HCT: 40.4 % (ref 36.0–46.0)
Hemoglobin: 13.2 g/dL (ref 12.0–15.0)
MCH: 28.3 pg (ref 26.0–34.0)
MCHC: 32.7 g/dL (ref 30.0–36.0)
MCV: 86.5 fL (ref 78.0–100.0)
PLATELETS: 332 10*3/uL (ref 150–400)
RBC: 4.67 MIL/uL (ref 3.87–5.11)
RDW: 16.4 % — ABNORMAL HIGH (ref 11.5–15.5)
WBC: 11.2 10*3/uL — ABNORMAL HIGH (ref 4.0–10.5)

## 2016-08-21 NOTE — ED Triage Notes (Signed)
Pt presents to ED with her daughter, daughter concerned because pt has been drinking and missing her psych medications. Pt denies SI/HI/AVH. Has had alcohol today. Daughter is concerned that pt will go back into a psychosis. Hx paranoid schizophrenia.

## 2016-08-21 NOTE — ED Notes (Signed)
Pt brought into the ED by her daughter for medical evaluation secondary to alcoholism.

## 2016-08-21 NOTE — ED Notes (Signed)
Pt's daughter returned to triage area, on the phone with "Tim" (pt's therapist), per Octavia Bruckner, pt needs to be prescribed "antibuse"

## 2016-08-22 DIAGNOSIS — F332 Major depressive disorder, recurrent severe without psychotic features: Secondary | ICD-10-CM | POA: Diagnosis not present

## 2016-08-22 DIAGNOSIS — F1010 Alcohol abuse, uncomplicated: Secondary | ICD-10-CM | POA: Diagnosis not present

## 2016-08-22 DIAGNOSIS — R441 Visual hallucinations: Secondary | ICD-10-CM | POA: Diagnosis not present

## 2016-08-22 DIAGNOSIS — F1910 Other psychoactive substance abuse, uncomplicated: Secondary | ICD-10-CM | POA: Diagnosis not present

## 2016-08-22 DIAGNOSIS — F1290 Cannabis use, unspecified, uncomplicated: Secondary | ICD-10-CM | POA: Diagnosis not present

## 2016-08-22 DIAGNOSIS — F200 Paranoid schizophrenia: Secondary | ICD-10-CM | POA: Diagnosis not present

## 2016-08-22 DIAGNOSIS — F17210 Nicotine dependence, cigarettes, uncomplicated: Secondary | ICD-10-CM | POA: Diagnosis not present

## 2016-08-22 DIAGNOSIS — F2089 Other schizophrenia: Secondary | ICD-10-CM | POA: Diagnosis not present

## 2016-08-22 LAB — URINALYSIS, ROUTINE W REFLEX MICROSCOPIC
Bilirubin Urine: NEGATIVE
Hgb urine dipstick: NEGATIVE
KETONES UR: NEGATIVE mg/dL
LEUKOCYTES UA: NEGATIVE
Nitrite: NEGATIVE
PROTEIN: NEGATIVE mg/dL
Specific Gravity, Urine: 1.007 (ref 1.005–1.030)
pH: 6 (ref 5.0–8.0)

## 2016-08-22 LAB — CBG MONITORING, ED: GLUCOSE-CAPILLARY: 239 mg/dL — AB (ref 65–99)

## 2016-08-22 MED ORDER — RISPERIDONE 2 MG PO TABS
2.0000 mg | ORAL_TABLET | Freq: Every day | ORAL | Status: DC
  Filled 2016-08-22: qty 1

## 2016-08-22 MED ORDER — LISINOPRIL 10 MG PO TABS
10.0000 mg | ORAL_TABLET | Freq: Every day | ORAL | Status: DC
  Administered 2016-08-22: 10 mg via ORAL
  Filled 2016-08-22: qty 1

## 2016-08-22 MED ORDER — BENZTROPINE MESYLATE 1 MG PO TABS
1.0000 mg | ORAL_TABLET | Freq: Every day | ORAL | Status: DC
  Administered 2016-08-22 – 2016-08-23 (×2): 1 mg via ORAL
  Filled 2016-08-22 (×2): qty 1

## 2016-08-22 MED ORDER — GABAPENTIN 300 MG PO CAPS
300.0000 mg | ORAL_CAPSULE | Freq: Every day | ORAL | Status: DC
  Administered 2016-08-22: 300 mg via ORAL
  Filled 2016-08-22: qty 1

## 2016-08-22 MED ORDER — TRAZODONE HCL 100 MG PO TABS
100.0000 mg | ORAL_TABLET | Freq: Every day | ORAL | Status: DC
  Administered 2016-08-22: 100 mg via ORAL
  Filled 2016-08-22: qty 1

## 2016-08-22 MED ORDER — MIRTAZAPINE 30 MG PO TABS: 30.0000 mg | ORAL_TABLET | Freq: Every evening | ORAL | Status: DC

## 2016-08-22 MED ORDER — METFORMIN HCL 500 MG PO TABS
500.0000 mg | ORAL_TABLET | Freq: Every day | ORAL | Status: DC
  Administered 2016-08-22 – 2016-08-23 (×2): 500 mg via ORAL
  Filled 2016-08-22 (×2): qty 1

## 2016-08-22 NOTE — ED Provider Notes (Signed)
[facility] DEPT Provider Note   CSN: [id number] Arrival date & time: 08/21/16  2020     History   Chief Complaint Chief Complaint  Patient presents with  . Alcohol Problem    HPI Kristen Colon is a 66 y.o. female.  The history is provided by the patient, a relative and a caregiver.  Alcohol Problem  This is a recurrent problem. The problem has been rapidly worsening. Associated symptoms include headaches. Pertinent negatives include no chest pain. Nothing aggravates the symptoms. Nothing relieves the symptoms.  pt with known h/o schizophrenia who has begun to drink ETOH again Per daughter who is caregiver, pt has begun to drink ETOH again and stopped taking psych meds She has had difficulty with ETOH previously and is concerned she may worsen her psychosis Her course is worsening Nothing improves her symptoms Pt denies any specific complaints She reports HA but no other specific complaints   Past Medical History:  Diagnosis Date  . Diabetes mellitus   . Hypertension   . Schizophrenia [facility])     Patient Active Problem List   Diagnosis Date Noted  . Schizoaffective disorder, chronic condition with acute exacerbation ([place])   . 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 Delusional disorder ([place])   . Psychoses   . Gallbladder polyp 07/21/2011  . Liver mass 07/21/2011    History reviewed. No pertinent surgical history.  OB History    No data available       Home Medications    Prior to Admission medications   Medication Sig Start Date End Date Taking? Authorizing Provider  INVEGA SUSTENNA 156 MG/ML SUSP injection Inject 1 mL (156 mg total) into the muscle every 30 (thirty) days. 09/05/14  Yes Dorie Rank, MD  amantadine (SYMMETREL) 100 MG capsule Take 100 mg by mouth 2 (two) times daily.    [provider]  benztropine (COGENTIN) 1 MG tablet Take 1 tablet by mouth at bedtime. 12/05/14   [provider]  Cyanocobalamin (VITAMIN B-12  PO) Take 1,000 mcg by mouth daily.     [provider]  gabapentin (NEURONTIN) 300 MG capsule Take 300 mg by mouth daily.     [provider]  lisinopril (PRINIVIL,ZESTRIL) 10 MG tablet Take 10 mg by mouth daily.    [provider]  metFORMIN (GLUCOPHAGE) 500 MG tablet Take 500 mg by mouth daily with breakfast.     [provider]  mirtazapine (REMERON) 30 MG tablet Take 1 tablet by mouth every evening. 07/14/14   [provider]  Multiple Vitamin (MULITIVITAMIN WITH MINERALS) TABS Take 1 tablet by mouth daily.    [provider]  naproxen (NAPROSYN) 500 MG tablet Take 1 tablet (500 mg total) by mouth 2 (two) times daily with a meal. As needed for pain 12/26/14   Dorie Rank, MD  oxyCODONE (OXY IR/ROXICODONE) 5 MG immediate release tablet Take 1-2 tablets by mouth every 8 (eight) hours as needed. pain 12/19/14   [provider]  risperiDONE (RISPERDAL) 2 MG tablet Take 1 tablet by mouth at bedtime. 12/05/14   [provider]  temazepam (RESTORIL) 30 MG capsule Take 1 capsule by mouth at bedtime as needed. sleep 12/05/14   [provider]  traZODone (DESYREL) 100 MG tablet Take 1 tablet by mouth daily. 08/11/14   [provider]    Family History Family History  Problem Relation Age of Onset  . Heart disease Mother   . Diabetes Sister     Social History  Social History  Substance Use Topics  . Smoking status: Current Every Day Smoker    Packs/day: 0.20    Types: Cigarettes  . Smokeless tobacco: Never Used  . Alcohol use Yes     Allergies   Patient has no known allergies.   Review of Systems Review of Systems  Constitutional: Negative for fever.  Cardiovascular: Negative for chest pain.  Neurological: Positive for headaches.  Psychiatric/Behavioral: Negative for suicidal ideas.  All other systems reviewed and are negative.    Physical Exam Updated Vital Signs BP (!) 154/97   Pulse 82    Temp 97.9 F (36.6 C) (Oral)   Resp 19   SpO2 99%   Physical Exam CONSTITUTIONAL: Elderly, mildly anxious, no acute distress HEAD: Normocephalic/atraumatic EYES: EOMI ENMT: Mucous membranes moist NECK: supple no meningeal signs SPINE/BACK:entire spine nontender CV: S1/S2 noted, no murmurs/rubs/gallops noted LUNGS: Lungs are clear to auscultation bilaterally, no apparent distress ABDOMEN: soft, nontender  GU:no cva tenderness NEURO: Pt is awake/alert, moves all extremitiesx4.  EXTREMITIES:   full ROM SKIN: warm, color normal PSYCH: flat affect  ED Treatments / Results  Labs (all labs ordered are listed, but only abnormal results are displayed) Labs Reviewed  COMPREHENSIVE METABOLIC PANEL - Abnormal; Notable for the following:       Result Value   Sodium 133 (*)    Chloride 98 (*)    Glucose, Bld 293 (*)    All other components within normal limits  ETHANOL - Abnormal; Notable for the following:    Alcohol, Ethyl (B) 24 (*)    All other components within normal limits  CBC - Abnormal; Notable for the following:    WBC 11.2 (*)    RDW 16.4 (*)    All other components within normal limits  RAPID URINE DRUG SCREEN, HOSP PERFORMED - Abnormal; Notable for the following:    Benzodiazepines POSITIVE (*)    All other components within normal limits  URINALYSIS, ROUTINE W REFLEX MICROSCOPIC - Abnormal; Notable for the following:    Color, Urine STRAW (*)    Glucose, UA >=500 (*)    Bacteria, UA RARE (*)    Squamous Epithelial / LPF 0-5 (*)    All other components within normal limits    EKG  EKG Interpretation  Date/Time:  Thursday August 21 2016 23:31:31 EDT Ventricular Rate:  89 PR Interval:    QRS Duration: 82 QT Interval:  350 QTC Calculation: 426 R Axis:   63 Text Interpretation:  Sinus rhythm Low voltage, precordial leads Interpretation limited secondary to artifact Confirmed by Ripley Fraise [phone]) on 08/21/2016 11:50:03 PM       Radiology No results  found.  Procedures Procedures (including critical care time)  Medications Ordered in ED Medications  benztropine (COGENTIN) tablet 1 mg (not administered)  gabapentin (NEURONTIN) capsule 300 mg (not administered)  lisinopril (PRINIVIL,ZESTRIL) tablet 10 mg (not administered)  metFORMIN (GLUCOPHAGE) tablet 500 mg (not administered)  mirtazapine (REMERON) tablet 30 mg (not administered)  risperiDONE (RISPERDAL) tablet 2 mg (not administered)  traZODone (DESYREL) tablet 100 mg (not administered)     Initial Impression / Assessment and Plan / ED Course  I have reviewed the triage vital signs and the nursing notes.  Pertinent labs  results that were available during my care of the patient were reviewed by me and considered in my medical decision making (see chart for details).    3:19 AM Pt medically stable Seen by TTS, will admit to geri-psych Home meds ordered  Pt has started to drink ETOH again but does not drink daily Daughter updated on plan   Final Clinical Impressions(s) / ED Diagnoses   Final diagnoses:  Other schizophrenia ([place])  Alcohol abuse    New Prescriptions New Prescriptions   No medications on file     Ripley Fraise, MD 08/22/16 0320

## 2016-08-22 NOTE — ED Notes (Signed)
Tele pysch in progress, spoke with daughter several times. Daughter is concerned about her mother stating she is concern about her mothers safety if she returns home.

## 2016-08-22 NOTE — ED Notes (Signed)
Pharmacy tech told RN that pt and her pharmacy are unsure of pt's meds. Pt's caregiver gets off of work at [place]. Pharm tech will call caregiver when she gets off work. Hold meds until they can be verified

## 2016-08-22 NOTE — ED Notes (Signed)
Currently patient is sleeping

## 2016-08-22 NOTE — BHH Counselor (Signed)
Ready to assess but, no EDP/PA/NP note in EPIC stating medically clear for assessment. Will contact pt's nurse to inquire.  Faylene Kurtz, MS, CRC, [place] Triage Specialist [facility]

## 2016-08-22 NOTE — BHH Counselor (Signed)
Per phone conversation with Dr. Christy Gentles, pt is medically cleared.  Kristen Kurtz, MS, CRC, [place] Triage Specialist [facility]

## 2016-08-22 NOTE — ED Notes (Signed)
Pt ambulated to bathroom by herself.

## 2016-08-22 NOTE — ED Notes (Signed)
Belongings inventoried by Freida Busman and placed in locker #2  Pt wanting to be home, states she's wondering "why I'm not at home in the bed that I pay for."

## 2016-08-22 NOTE — ED Notes (Signed)
Patient is sitting on the side of her bed, no complaints , alert oriented.

## 2016-08-22 NOTE — Progress Notes (Signed)
Patient meets criteria for inpatient treatment. CSW faxed referrals to the following inpatient facilities for review:  [facility], [place], [place], [place], [place]   TTS will continue to seek bed placement.   Radonna Ricker MSW, [place] Disposition [phone]

## 2016-08-22 NOTE — Progress Notes (Signed)
CSW received a phone call from the patient's daughter/caretaker,  Larkin Ina [phone]).  Patient's daughter wanted to know what the treatment recommendations were regarding her mother's care.   CSW informed patient's daughter, Larkin Ina,  That her mother   was evaluated and assessed by the psychiatric provider and was recommended for inpatient gero-psychiatric treatment.   Patient's daughter was agreeable with this recommendation and requested for Disposition CSW/TTS to update her once the patient has been placed at a facility.   [place] requested that  Disposition/ TTS text her once her mother is placed, due to her not being able to talk directly on the phone while she is at work.   CSW will continue to follow.   Radonna Ricker MSW, [place] Disposition [phone]

## 2016-08-22 NOTE — ED Notes (Signed)
Pt ambulated to and from bathroom, requesting a "smoke break".  Offered nicotine patch, pt declined.
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
## 2016-08-23 DIAGNOSIS — F2089 Other schizophrenia: Secondary | ICD-10-CM | POA: Diagnosis not present

## 2016-08-23 MED ORDER — TRAZODONE HCL 50 MG PO TABS: 50.0000 mg | ORAL_TABLET | Freq: Every day | ORAL | Status: DC

## 2016-08-23 MED ORDER — MEMANTINE HCL 10 MG PO TABS
5.0000 mg | ORAL_TABLET | Freq: Every day | ORAL | Status: DC
  Administered 2016-08-23: 5 mg via ORAL
  Filled 2016-08-23: qty 1

## 2016-08-23 MED ORDER — VITAMIN C 500 MG PO TABS
500.0000 mg | ORAL_TABLET | Freq: Every day | ORAL | Status: DC
  Administered 2016-08-23: 500 mg via ORAL
  Filled 2016-08-23: qty 1

## 2016-08-23 MED ORDER — VITAMIN B-12 1000 MCG PO TABS
2000.0000 ug | ORAL_TABLET | Freq: Every day | ORAL | Status: DC
  Administered 2016-08-23: 2000 ug via ORAL
  Filled 2016-08-23: qty 2

## 2016-08-23 MED ORDER — LACTULOSE 10 GM/15ML PO SOLN
10.0000 g | Freq: Every day | ORAL | Status: DC
  Administered 2016-08-23: 10 g via ORAL
  Filled 2016-08-23: qty 30

## 2016-08-23 MED ORDER — PALIPERIDONE PALMITATE 156 MG/ML IM SUSP: 156.0000 mg | Freq: Once | INTRAMUSCULAR | Status: DC

## 2016-08-23 MED ORDER — FOLIC ACID 1 MG PO TABS
1.0000 mg | ORAL_TABLET | Freq: Every day | ORAL | Status: DC
  Administered 2016-08-23: 1 mg via ORAL
  Filled 2016-08-23: qty 1

## 2016-08-23 MED ORDER — TEMAZEPAM 15 MG PO CAPS: 15.0000 mg | ORAL_CAPSULE | Freq: Every day | ORAL | Status: DC

## 2016-08-23 NOTE — ED Notes (Signed)
Pt noted to orthopedic device to left wrist/forearm - states she has a sprain x 2 days. Pt able to move fingers w/o difficulty. Pt w/artificial nails - unable to perform cap refill.

## 2016-08-23 NOTE — ED Notes (Signed)
Pt going to shower in C pod. Pt accompanied by tech

## 2016-08-23 NOTE — ED Notes (Signed)
Pt back from shower. Linens changed. Pt comfortable at this time

## 2016-08-23 NOTE — ED Notes (Signed)
Verified [facility] received IVC paperwork - Diane, Agricultural consultant, to call RN back for report. Advised OK to go ahead and send pt d/t deputy arrived w/IVC papers and is transporting pt.

## 2016-08-23 NOTE — Progress Notes (Signed)
Patient was accepted to [facility]. However, acceptance is pending at this moment due to legal guardian pt's daughter, Kristen Colon (new number- [phone]), not able to provide legal guardianship documentation.  Verlon Setting, [place] Disposition staff 08/23/2016 2:21 PM

## 2016-08-23 NOTE — Progress Notes (Addendum)
Dellis Filbert at [facility] requested that patient be involuntarily committed due to patient's psychosis.  Case has been discussed with [facility] psychiatry team, with Jinny Blossom NP who recommended that patient be IVC'd due to patient's acute psychosis and inability to sign herself in at [facility] geriatric unit. MC-ED Staff, RN Wells Guiles and Dr. Vanita Panda, were notified. IVC paperwork can be faxed to [facility] at fax#:[phone].  Verlon Setting, MSW, [place] Clinical social worker in [facility], [place] Office [phone] and [phone] 08/23/2016 2:40 PM

## 2016-08-23 NOTE — ED Notes (Addendum)
Belongings - 3 labeled belongings bag to be given to deputy - VALUABLES ALONG W/PT'S PURSE TO BE PICKED UP BY PT'S DAUGHTER (LEGAL GUARDIAN) - [place]. PT VOICES AGREEMENT W/PLAN.

## 2016-08-23 NOTE — ED Notes (Signed)
Breakfast at bedside.

## 2016-08-23 NOTE — ED Provider Notes (Signed)
Given patient's psychosis, noncompliance with medication, and history with schizophrenia behavioral health recommended that the patient has involuntary commitment papers performed, and be admitted to a regional behavioral health facility. On my exam the patient was sleeping.   Carmin Muskrat, MD 08/23/16 1444

## 2016-08-23 NOTE — ED Notes (Signed)
Pt ambulated to bathroom 

## 2016-08-23 NOTE — Progress Notes (Signed)
Patient has been recommended inpatient psychiatric treatment and has been referred to treatment at [facility], [facility], [facility], [place], [facility], [place], and [facility].  Patient's referral has been followed up at the following geriatric inpatient treatment facilities: [facility] - per [facility], no beds. [facility] - per Dellis Filbert, refax it. Referral refaxed.  [facility] - per nurse intake, at capacity today. [facility] - per intake, if the nurses didn't call, pt was not accepted. [facility] - per [place], no beds today. [facility] - per Shauna Hugh, at capacity.  Declined at: [facility] - declined due alcohol abuse, per Shirlee Limerick.   Patient has been referred today to the following geriatric inpatient treatment facilities: [facility], [place], and [facility], [facility].  CSW in disposition will continue to follow up and seek placement.  Verlon Setting, [place] Disposition staff 08/23/2016 8:46 AM

## 2016-08-23 NOTE — ED Notes (Signed)
Pt lying on bed, alert. Offered pt po fluid/food - declined at this time.

## 2016-08-23 NOTE — ED Notes (Signed)
Comb given to pt as requested. Lunch tray on bedside table.

## 2016-08-23 NOTE — ED Notes (Signed)
Pt lying on bed, alert. Offered for pt to read newspaper - declined - states "I'm just laying here thinking".

## 2016-08-23 NOTE — ED Notes (Signed)
Pt spoke w/daughter, [name], on phone at nurses' desk. Pt ambulated in hallway of Pod F x 1.

## 2016-08-23 NOTE — ED Notes (Signed)
Attempted to call report to [facility] - [phone] - advised for RN to fax copy of IVC papers when served then call report. Diane aware may be approx 1 hour prior to this occurring.

## 2016-08-23 NOTE — ED Notes (Signed)
Pt eating snack given - [place] sandwich and Sprite Zero. Pt voiced understanding of Tx plan - waiting for placement.

## 2016-08-23 NOTE — ED Notes (Signed)
IVC paperwork received by Magistrate - aware Sgt Paschal will pick up papers and then transport pt.

## 2016-08-23 NOTE — ED Notes (Signed)
Pharm tech stated that pt's caregiver did not answer phone again

## 2016-08-23 NOTE — ED Notes (Signed)
Spoke w/pt's daughter [name], [place]. States she is in agreement w/pt being admitted to [facility] and advised she is pt's Legal Guardian. States "I gave y'all the paperwork years ago and if you lost it, that's on you". Advised daughter RN has searched through chart, unable to locate. Asked if she can bring a copy - states she is unable to do so d/t "it is in my garage and I can't get to it until Monday". States she is unable to go to [facility] to sign pt in d/t she is getting ready for work. Advised her Dellis Filbert at [facility] had asked for pt to be IVC'd d/t delusional and is unable to sign herself in as daughter is Garment/textile technologist. Advised daughter will ask EDP if IVC paperwork may be completed. Daughter voiced appreciation - requested for RN to keep pt's valuables w/security in ED and she will pick them up tomorrow. Pt aware and is in agreement. Dr Vanita Panda advised daughter will need to sign pt in as pt does not meet criteria at this time for IVC. [place], [facility], aware and advised she will contact [facility] and daughter.

## 2016-08-23 NOTE — ED Notes (Signed)
Kristen Colon, Pharmacy, advised will verify meds and send Vitamin B12.

## 2016-08-23 NOTE — ED Notes (Signed)
Pt has been accepted to [facility] - Dr Albertina Parr - [phone] - pt states her daughter, Larkin Ina, is her Legal Guardian. RN was advised by Loreli Dollar [facility], daughter last evening and had advised will be sleeping until 1300. Will attempt to reach daughter at that time.

## 2016-08-23 NOTE — Progress Notes (Signed)
Patient is being reviewed for possible placement at [place] with [facility] inquired about patient's appetite/eating and sleeping. If any UTI.  Per MC-ED RN Wells Guiles, patient does not have an UTI and has been eating and sleeping well. Dellis Filbert at [facility] aware.  Dellis Filbert inquired if patient had criteria for inpatient treatment as per [facility] notes, patient is not SI, HI, or AVH. Writer consulted with Jinny Blossom NP at [place] psychiatry team and advised Dellis Filbert that patient has been very psychotic seeing visual hallucinations. Patient reported that she has: "given birth to 7 children and all are smaller than a Barbee doll." Per Tele Assessment Note: "Patient also stated that guardian angel raised her children and once brought her back and forth from her job at [employer] she got her "MD" degree in [place]." [facility] aware and advised that he will present case to their doctor.  This Probation officer will continue to follow up with placement efforts.  Verlon Setting, [place] Disposition staff 08/23/2016 12:11 PM

## 2016-08-23 NOTE — ED Notes (Addendum)
[facility] advised requires for pt to be IVC'd so may accept and provide tx to pt. [place], [facility], spoke w/Dr Vanita Panda.

## 2016-08-23 NOTE — ED Notes (Signed)
Sgt Paschal aware of need for transport to [facility] and IVC paperwork is currently be performed. Advised he will go to Magistrate's office and pick up papers, serve and transport pt.

## 2016-08-23 NOTE — BH Assessment (Signed)
Patient re-assessed on this date.  Patient reports sleeping all night and feeling "okay" this morning.  Patient also reports feeling better today than yesterday, however she does feel "restless."  Patient denied current SI and reports children are a protective factor, and denied HI and AVH.  Patient reports taking a shower this morning and having a good appetite and eating breakfast..

## 2016-08-23 NOTE — ED Notes (Signed)
[facility], called and asked for Section "B" to be completed on IVC papers. Advised him that Sgt Paschal served papers and is transporting pt - he advised will will out this along w/sections "C" and "D" when he arrives w/pt.

## 2016-08-23 NOTE — Progress Notes (Addendum)
Patient was accepted at [facility], to Dr. Vikki Ports, no bed assigned at this moment.   Intake Kristen Colon at [facility] inquired about patient's Legal Guardianship documents.  This Probation officer spoke with patient's daughter, Kristen Colon (new [phone]), this morning and was advised that she will be sleeping until 13:00 today and to please not disturb her. Spoke with [facility]-ED RN Wells Guiles and advised of acceptance at [facility] and need for LG papers.   Writer will continue to follow up to ensure that legal papers will be faxed to [facility] at fax#: [phone].  Once legal guardianship papers received at [facility], report may be called at [phone]. MC-ED RN Wells Guiles was informed.  Verlon Setting, [place] Disposition staff 08/23/2016 12:30 PM

## 2016-08-23 NOTE — ED Notes (Addendum)
Kristen Colon, [facility], aware of request for NP, [place] to review pt's meds. Re-TTS being performed.

## 2016-08-23 NOTE — Progress Notes (Addendum)
Psychiatry team at [facility] inquired about patient's medication in order to recommend medication for patient.  Writer spoke with patient's daughter, [name] (new [phone]), who advised that patient is prescribed meds by Dr. Darleene Cleaver to [facility] (([phone]) [place], [place], [place] [address]. Jinny Blossom, NP, at [place] office was notified.  Patient's daughter advised that patient has been putting medication under her tongue to avoid taking it. Non compliant with her home meds. This writer will continue to follow up and seek geriatric placement for patient.  Verlon Setting, MSW, [place] Social Worker in Disposition [phone] 08/23/2016 9:53 AM

## 2016-08-23 NOTE — ED Notes (Signed)
Starleen Arms, NP, [place], corrected pt's MAR after speaking w/pt's pharmacy. Pt last received [place] at end of July so not due until end of August. Waiting on meds from pharamacy.

## 2016-08-23 NOTE — ED Notes (Signed)
Pt states her daughter brought her to ED d/t "I drank beer and bought a pack of cigarettes and she don't want me doing that". States she has been taking her medications as directed by dr. Abbott Pao denies [place]. Offered for pt color, read magazine, or play cards - declined.

## 2016-08-23 NOTE — ED Notes (Signed)
Pt aware transportation [facility] Paschal) called and advised should be arriving to Magistrate's Office in approx 10 min then coming to ED.

## 2016-08-23 NOTE — Consult Note (Signed)
  [facility] EDP requested medication reconciliation for restart of home medications.   This Probation officer contacted [facility] [phone] to verify most current medication list.   The following medications were placed on Pt's MAR:   Temazepam 15 mg PO QHS Insomnia Cogentin 1 mg PO QHS Extra pyramidal side effects Metformin 500 mg PO BID Type 2 Diabetes Namenda 5 mg PO QD Dementia Trazadone 50 mg PO QHS  Insomnia Vit B12 2000 mcg PO QD  Vitamin S16 deficiency Folic Acid 1 mg PO QD  Folic acid deficiency Vitamin C 500 mg PO QD  Vitamin C deficiency Lactulose 10mg  per 15 ml PO QD Chronic constipation   The following medications were removed from Pt's MAR due to Pt not taking anymore:  Risperidone 2 mg    Pt stopped taking in Feb 2018 Gabapentin 300 mg PO    Remeron 30 mg QHS Lisinopril 10 mg PO QD  Pt is taking [place] extended release 156 mg injection one monthly, this medication is administered at her psychiatrist's office and is not due until the end of August.   Ethelene Hal, NP-C 08/23/2016    1017

## 2016-08-26 NOTE — Progress Notes (Signed)
Pt's daughter, [name], called this Probation officer to express a concern that she could not talk to her mother or receive information from [facility].  Daughter states that she is the guardian but currently would need to get guardianship papers "from [facility]" as she doesn't have a copy. According to her daughter, she has given those papers to "many hospitals" but is not certain that she has given a copy to [facility].  A review of Media in the pt's MR also does not include any guardianship paperwork.  CSW suggested that daughter contact the [employer] in [place] T. Judi Cong, MSW, [place] [facility] Clinical Social Work [phone] (cell) [phone] (office)

## 2016-09-19 ENCOUNTER — Inpatient Hospital Stay (HOSPITAL_COMMUNITY)
Admission: EM | Admit: 2016-09-19 | Discharge: 2016-09-23 | DRG: 072 | Disposition: A | Discharge status: Home Health | Payer: Medicare Other | Attending: Internal Medicine | Admitting: Internal Medicine

## 2016-09-19 ENCOUNTER — Emergency Department (HOSPITAL_COMMUNITY): Payer: Medicare Other

## 2016-09-19 ENCOUNTER — Encounter (HOSPITAL_COMMUNITY): Payer: Self-pay

## 2016-09-19 DIAGNOSIS — E119 Type 2 diabetes mellitus without complications: Secondary | ICD-10-CM | POA: Diagnosis not present

## 2016-09-19 DIAGNOSIS — G9340 Encephalopathy, unspecified: Secondary | ICD-10-CM | POA: Diagnosis not present

## 2016-09-19 DIAGNOSIS — Z79891 Long term (current) use of opiate analgesic: Secondary | ICD-10-CM | POA: Diagnosis not present

## 2016-09-19 DIAGNOSIS — I160 Hypertensive urgency: Secondary | ICD-10-CM | POA: Diagnosis not present

## 2016-09-19 DIAGNOSIS — N83201 Unspecified ovarian cyst, right side: Secondary | ICD-10-CM | POA: Diagnosis present

## 2016-09-19 DIAGNOSIS — Z79899 Other long term (current) drug therapy: Secondary | ICD-10-CM

## 2016-09-19 DIAGNOSIS — F17210 Nicotine dependence, cigarettes, uncomplicated: Secondary | ICD-10-CM | POA: Diagnosis not present

## 2016-09-19 DIAGNOSIS — D259 Leiomyoma of uterus, unspecified: Secondary | ICD-10-CM | POA: Diagnosis not present

## 2016-09-19 DIAGNOSIS — R410 Disorientation, unspecified: Secondary | ICD-10-CM

## 2016-09-19 DIAGNOSIS — N838 Other noninflammatory disorders of ovary, fallopian tube and broad ligament: Secondary | ICD-10-CM

## 2016-09-19 DIAGNOSIS — F0280 Dementia in other diseases classified elsewhere without behavioral disturbance: Secondary | ICD-10-CM | POA: Diagnosis not present

## 2016-09-19 DIAGNOSIS — Z23 Encounter for immunization: Secondary | ICD-10-CM

## 2016-09-19 DIAGNOSIS — K8020 Calculus of gallbladder without cholecystitis without obstruction: Secondary | ICD-10-CM | POA: Diagnosis present

## 2016-09-19 DIAGNOSIS — R269 Unspecified abnormalities of gait and mobility: Secondary | ICD-10-CM | POA: Diagnosis present

## 2016-09-19 DIAGNOSIS — Z833 Family history of diabetes mellitus: Secondary | ICD-10-CM | POA: Diagnosis not present

## 2016-09-19 DIAGNOSIS — Z8249 Family history of ischemic heart disease and other diseases of the circulatory system: Secondary | ICD-10-CM

## 2016-09-19 DIAGNOSIS — I10 Essential (primary) hypertension: Secondary | ICD-10-CM | POA: Diagnosis not present

## 2016-09-19 DIAGNOSIS — R140 Abdominal distension (gaseous): Secondary | ICD-10-CM | POA: Diagnosis not present

## 2016-09-19 DIAGNOSIS — Z7984 Long term (current) use of oral hypoglycemic drugs: Secondary | ICD-10-CM | POA: Diagnosis not present

## 2016-09-19 DIAGNOSIS — F1010 Alcohol abuse, uncomplicated: Secondary | ICD-10-CM | POA: Diagnosis present

## 2016-09-19 DIAGNOSIS — R112 Nausea with vomiting, unspecified: Secondary | ICD-10-CM

## 2016-09-19 DIAGNOSIS — F0150 Vascular dementia without behavioral disturbance: Secondary | ICD-10-CM | POA: Diagnosis not present

## 2016-09-19 DIAGNOSIS — F203 Undifferentiated schizophrenia: Secondary | ICD-10-CM | POA: Diagnosis not present

## 2016-09-19 HISTORY — DX: Unspecified dementia, unspecified severity, without behavioral disturbance, psychotic disturbance, mood disturbance, and anxiety: F03.90

## 2016-09-19 LAB — COMPREHENSIVE METABOLIC PANEL
ALBUMIN: 3.8 g/dL (ref 3.5–5.0)
ALK PHOS: 87 U/L (ref 38–126)
ALT: 35 U/L (ref 14–54)
ANION GAP: 8 (ref 5–15)
AST: 28 U/L (ref 15–41)
BUN: 9 mg/dL (ref 6–20)
CALCIUM: 9.6 mg/dL (ref 8.9–10.3)
CO2: 24 mmol/L (ref 22–32)
CREATININE: 0.7 mg/dL (ref 0.44–1.00)
Chloride: 105 mmol/L (ref 101–111)
GFR calc Af Amer: >60 mL/min (ref >60)
GFR calc non Af Amer: >60 mL/min (ref >60)
GLUCOSE: 156 mg/dL — AB (ref 65–99)
Potassium: 4.3 mmol/L (ref 3.5–5.1)
SODIUM: 137 mmol/L (ref 135–145)
Total Bilirubin: 0.5 mg/dL (ref 0.3–1.2)
Total Protein: 7.4 g/dL (ref 6.5–8.1)

## 2016-09-19 LAB — CBC
HCT: 40.7 % (ref 36.0–46.0)
HEMOGLOBIN: 13.6 g/dL (ref 12.0–15.0)
MCH: 28.8 pg (ref 26.0–34.0)
MCHC: 33.4 g/dL (ref 30.0–36.0)
MCV: 86 fL (ref 78.0–100.0)
Platelets: 289 10*3/uL (ref 150–400)
RBC: 4.73 MIL/uL (ref 3.87–5.11)
RDW: 16.1 % — ABNORMAL HIGH (ref 11.5–15.5)
WBC: 9.1 10*3/uL (ref 4.0–10.5)

## 2016-09-19 LAB — URINALYSIS, ROUTINE W REFLEX MICROSCOPIC
BILIRUBIN URINE: NEGATIVE
Glucose, UA: 50 mg/dL — AB
HGB URINE DIPSTICK: NEGATIVE
Ketones, ur: NEGATIVE mg/dL
Leukocytes, UA: NEGATIVE
Nitrite: NEGATIVE
PH: 6 (ref 5.0–8.0)
Protein, ur: NEGATIVE mg/dL
SPECIFIC GRAVITY, URINE: 1.016 (ref 1.005–1.030)

## 2016-09-19 LAB — RAPID URINE DRUG SCREEN, HOSP PERFORMED
Amphetamines: NOT DETECTED
BARBITURATES: NOT DETECTED
BENZODIAZEPINES: POSITIVE — AB
COCAINE: NOT DETECTED
Opiates: NOT DETECTED
TETRAHYDROCANNABINOL: NOT DETECTED

## 2016-09-19 LAB — ETHANOL: Alcohol, Ethyl (B): <5 mg/dL (ref <5)

## 2016-09-19 LAB — LIPASE, BLOOD: Lipase: 26 U/L (ref 11–51)

## 2016-09-19 MED ORDER — IOPAMIDOL (ISOVUE-300) INJECTION 61%
INTRAVENOUS | Status: AC
  Administered 2016-09-19: 100 mL
  Filled 2016-09-19: qty 100

## 2016-09-19 NOTE — ED Triage Notes (Signed)
Per Pt's daughter, pt is coming from home with complaints of distended abdomen, abdominal pain, N/V, along with increased confusion over four days. Reports increased CBG and Blood pressure.

## 2016-09-19 NOTE — ED Notes (Signed)
Patient transported to CT 

## 2016-09-19 NOTE — ED Provider Notes (Signed)
[facility] DEPT Provider Note   CSN: [id number] Arrival date & time: 09/19/16  1347     History   Chief Complaint Chief Complaint  Patient presents with  . Hypertension   Level V caveat due to psychiatric disorder. HPI Kristen Colon is a 66 y.o. female.  The history is provided by the patient and a relative.  Hypertension   Patient presents with several different complaints. History of dementia and schizophrenia. Recently got out of a psychiatric hospital with some medication adjustments. Has had more confusion. Has had nausea and vomiting. Has had abdominal pain. Has had hypertension up to 952 systolic. Has seen her primary care doctor for some of this. Patient is mildly confused and difficult to get history from. No reported diarrhea.  Past Medical History:  Diagnosis Date  . Dementia   . Diabetes mellitus   . Hypertension   . Schizophrenia [facility])     Patient Active Problem List   Diagnosis Date Noted  . Schizoaffective disorder, chronic condition with acute exacerbation ([place])   . Schizophrenia, undifferentiated ([place]) 06/21/2014  . Delusional disorder ([place])   . Psychoses   . Gallbladder polyp 07/21/2011  . Liver mass 07/21/2011    History reviewed. No pertinent surgical history.  OB History    No data available       Home Medications    Prior to Admission medications   Medication Sig Start Date End Date Taking? Authorizing Provider  amantadine (SYMMETREL) 100 MG capsule Take 100 mg by mouth 2 (two) times daily.    [provider]  baclofen (LIORESAL) 10 MG tablet Take 10 mg by mouth daily. 07/21/16   [provider]  benztropine (COGENTIN) 1 MG tablet Take 1 mg by mouth 2 (two) times daily.  12/05/14   [provider]  Cyanocobalamin (VITAMIN B-12 PO) Take 1,000 mcg by mouth daily.     [provider]  gabapentin (NEURONTIN) 300 MG capsule Take 300 mg by mouth daily.     [provider]  INVEGA 6 MG 24 hr  tablet Take 6 mg by mouth daily with supper. 08/12/16   [provider]  INVEGA SUSTENNA 156 MG/ML SUSP injection Inject 1 mL (156 mg total) into the muscle every 30 (thirty) days. 09/05/14   Dorie Rank, MD  lactulose (CHRONULAC) 10 GM/15ML solution Take 10 g by mouth daily. 08/12/16   [provider]  lisinopril (PRINIVIL,ZESTRIL) 10 MG tablet Take 10 mg by mouth daily.    [provider]  memantine (NAMENDA) 5 MG tablet Take 5 mg by mouth at bedtime. 5 08/12/16   [provider]  metFORMIN (GLUCOPHAGE) 500 MG tablet Take 500 mg by mouth 2 (two) times daily with a meal.     [provider]  mirtazapine (REMERON) 30 MG tablet Take 1 tablet by mouth every evening. 07/14/14   [provider]  Multiple Vitamin (MULITIVITAMIN WITH MINERALS) TABS Take 1 tablet by mouth daily.    [provider]  naproxen (NAPROSYN) 500 MG tablet Take 1 tablet (500 mg total) by mouth 2 (two) times daily with a meal. As needed for pain 12/26/14   Dorie Rank, MD  oxyCODONE (OXY IR/ROXICODONE) 5 MG immediate release tablet Take 1-2 tablets by mouth every 8 (eight) hours as needed. pain 12/19/14   [provider]  predniSONE (DELTASONE) 20 MG tablet Take 20-40 mg by mouth See admin instructions. Take 2 tablets (40 mg) by mouth 1st day, then take 1 tablet (20  mg) daily until gone - 7 days 08/12/16   [provider]  risperiDONE (RISPERDAL) 2 MG tablet Take 1 tablet by mouth at bedtime. 12/05/14   [provider]  temazepam (RESTORIL) 15 MG capsule Take 15 mg by mouth at bedtime. 08/12/16   [provider]  traZODone (DESYREL) 50 MG tablet Take 50 mg by mouth at bedtime. 08/12/16   [provider]    Family History Family History  Problem Relation Age of Onset  . Heart disease Mother   . Diabetes Sister     Social History Social History  Substance Use Topics  . Smoking status: Current Every Day Smoker    Packs/day: 0.20      Types: Cigarettes  . Smokeless tobacco: Never Used  . Alcohol use Yes     Allergies   Patient has no known allergies.   Review of Systems Review of Systems  Unable to perform ROS: Psychiatric disorder  Constitutional: Negative for appetite change.     Physical Exam Updated Vital Signs BP (!) 181/87   Pulse 84   Temp 97.6 F (36.4 C) (Oral)   Resp 12   Ht 5\' 5"  (1.651 m)   Wt 59 kg (130 lb)   SpO2 99%   BMI 21.63 kg/m   Physical Exam  Constitutional: She appears well-developed.  HENT:  Head: Atraumatic.  Eyes: EOM are normal.  Cardiovascular: Normal rate.   Pulmonary/Chest: Effort normal.  Abdominal: She exhibits mass.  Tenderness and fullness in right lower abdomen.  Musculoskeletal: She exhibits no edema.  Neurological: She is alert.  Patient is awake and pleasant but mildly confused. Patient's daughter states patient is much more confused than her baseline.  Skin: Skin is warm. Capillary refill takes less than 2 seconds.     ED Treatments / Results  Labs (all labs ordered are listed, but only abnormal results are displayed) Labs Reviewed  COMPREHENSIVE METABOLIC PANEL - Abnormal; Notable for the following:       Result Value   Glucose, Bld 156 (*)    All other components within normal limits  CBC - Abnormal; Notable for the following:    RDW 16.1 (*)    All other components within normal limits  URINALYSIS, ROUTINE W REFLEX MICROSCOPIC - Abnormal; Notable for the following:    APPearance HAZY (*)    Glucose, UA 50 (*)    All other components within normal limits  LIPASE, BLOOD  ETHANOL  RAPID URINE DRUG SCREEN, HOSP PERFORMED    EKG  EKG Interpretation None       Radiology Ct Head Wo Contrast  Result Date: 09/19/2016 CLINICAL DATA:  Increased confusion EXAM: CT HEAD WITHOUT CONTRAST TECHNIQUE: Contiguous axial images were obtained from the base of the skull through the vertex without intravenous contrast. COMPARISON:  12/26/2014  FINDINGS: Brain: No acute territorial infarction, hemorrhage or mass is seen. Minimal small vessel ischemic changes of the white matter. 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 Stable ventricle size. Mild to moderate atrophy. Vascular: No hyperdense vessels. Scattered calcifications at the carotid siphons. Skull: Normal. Negative for fracture or focal lesion. Sinuses/Orbits: Mild mucosal thickening in the ethmoid sinuses. Old appearing deformity medial wall left orbit. No acute orbital abnormality. Other: None IMPRESSION: 1. No CT evidence for acute intracranial abnormality. Electronically Signed   By: Donavan Foil M.D.   On: 09/19/2016 22:06    Procedures Procedures (including critical care time)  Medications Ordered in ED Medications  iopamidol (ISOVUE-300) 61 %  injection (100 mLs  Contrast Given 09/19/16 2143)     Initial Impression / Assessment and Plan / ED Course  I have reviewed the triage vital signs and the nursing notes.  Pertinent labs & imaging results that were available during my care of the patient were reviewed by me and considered in my medical decision making (see chart for details).     Patient presents with nausea vomiting confusion hypertension. Also has history of psychiatric disorder and dementia. Has seen primary care doctor. CT scan of abdomen shows ovarian mass that is enlarged from last CT 2 years ago. Patient's daughter states that she knew nothing about the previous possible cancer. With the worsening mental status could be due to medications or even her schizophrenia. However I think this may make follow-up more difficult and patient may benefit from observation admission for potential medication adjustment and evaluation of the ovarian mass.  Final Clinical Impressions(s) / ED Diagnoses   Final diagnoses:  Ovarian mass  Confusion  Non-intractable vomiting with nausea, unspecified vomiting type    New Prescriptions New Prescriptions   No medications  on file     Davonna Belling, MD 09/19/16 2308

## 2016-09-20 ENCOUNTER — Encounter (HOSPITAL_COMMUNITY): Payer: Self-pay | Admitting: Internal Medicine

## 2016-09-20 ENCOUNTER — Observation Stay (HOSPITAL_COMMUNITY): Payer: Medicare Other

## 2016-09-20 DIAGNOSIS — F0150 Vascular dementia without behavioral disturbance: Secondary | ICD-10-CM | POA: Diagnosis present

## 2016-09-20 DIAGNOSIS — Z23 Encounter for immunization: Secondary | ICD-10-CM | POA: Diagnosis not present

## 2016-09-20 DIAGNOSIS — I10 Essential (primary) hypertension: Secondary | ICD-10-CM | POA: Diagnosis present

## 2016-09-20 DIAGNOSIS — E119 Type 2 diabetes mellitus without complications: Secondary | ICD-10-CM

## 2016-09-20 DIAGNOSIS — R410 Disorientation, unspecified: Secondary | ICD-10-CM | POA: Diagnosis present

## 2016-09-20 DIAGNOSIS — I160 Hypertensive urgency: Secondary | ICD-10-CM | POA: Diagnosis present

## 2016-09-20 DIAGNOSIS — F0280 Dementia in other diseases classified elsewhere without behavioral disturbance: Secondary | ICD-10-CM | POA: Diagnosis present

## 2016-09-20 DIAGNOSIS — Z79899 Other long term (current) drug therapy: Secondary | ICD-10-CM | POA: Diagnosis not present

## 2016-09-20 DIAGNOSIS — G9340 Encephalopathy, unspecified: Secondary | ICD-10-CM | POA: Diagnosis present

## 2016-09-20 DIAGNOSIS — N838 Other noninflammatory disorders of ovary, fallopian tube and broad ligament: Secondary | ICD-10-CM

## 2016-09-20 DIAGNOSIS — R269 Unspecified abnormalities of gait and mobility: Secondary | ICD-10-CM | POA: Diagnosis present

## 2016-09-20 DIAGNOSIS — Z7984 Long term (current) use of oral hypoglycemic drugs: Secondary | ICD-10-CM | POA: Diagnosis not present

## 2016-09-20 DIAGNOSIS — F17210 Nicotine dependence, cigarettes, uncomplicated: Secondary | ICD-10-CM | POA: Diagnosis present

## 2016-09-20 DIAGNOSIS — F1010 Alcohol abuse, uncomplicated: Secondary | ICD-10-CM | POA: Diagnosis present

## 2016-09-20 DIAGNOSIS — Z833 Family history of diabetes mellitus: Secondary | ICD-10-CM | POA: Diagnosis not present

## 2016-09-20 DIAGNOSIS — R112 Nausea with vomiting, unspecified: Secondary | ICD-10-CM

## 2016-09-20 DIAGNOSIS — Z79891 Long term (current) use of opiate analgesic: Secondary | ICD-10-CM | POA: Diagnosis not present

## 2016-09-20 DIAGNOSIS — F203 Undifferentiated schizophrenia: Secondary | ICD-10-CM | POA: Diagnosis present

## 2016-09-20 DIAGNOSIS — R140 Abdominal distension (gaseous): Secondary | ICD-10-CM | POA: Diagnosis present

## 2016-09-20 DIAGNOSIS — Z8249 Family history of ischemic heart disease and other diseases of the circulatory system: Secondary | ICD-10-CM | POA: Diagnosis not present

## 2016-09-20 DIAGNOSIS — N83201 Unspecified ovarian cyst, right side: Secondary | ICD-10-CM | POA: Diagnosis present

## 2016-09-20 DIAGNOSIS — K8020 Calculus of gallbladder without cholecystitis without obstruction: Secondary | ICD-10-CM | POA: Diagnosis present

## 2016-09-20 DIAGNOSIS — D259 Leiomyoma of uterus, unspecified: Secondary | ICD-10-CM | POA: Diagnosis present

## 2016-09-20 LAB — GLUCOSE, CAPILLARY
GLUCOSE-CAPILLARY: 118 mg/dL — AB (ref 65–99)
GLUCOSE-CAPILLARY: 153 mg/dL — AB (ref 65–99)
Glucose-Capillary: 183 mg/dL — ABNORMAL HIGH (ref 65–99)
Glucose-Capillary: 310 mg/dL — ABNORMAL HIGH (ref 65–99)
Glucose-Capillary: 205 mg/dL — ABNORMAL HIGH (ref 65–99)

## 2016-09-20 LAB — BASIC METABOLIC PANEL
ANION GAP: 3 — AB (ref 5–15)
BUN: 13 mg/dL (ref 6–20)
CO2: 32 mmol/L (ref 22–32)
CREATININE: 0.76 mg/dL (ref 0.44–1.00)
Calcium: 9.2 mg/dL (ref 8.9–10.3)
Chloride: 102 mmol/L (ref 101–111)
GFR calc non Af Amer: >60 mL/min (ref >60)
Glucose, Bld: 253 mg/dL — ABNORMAL HIGH (ref 65–99)
POTASSIUM: 3.8 mmol/L (ref 3.5–5.1)
Sodium: 137 mmol/L (ref 135–145)

## 2016-09-20 LAB — CBC
HCT: 39.2 % (ref 36.0–46.0)
Hemoglobin: 13.1 g/dL (ref 12.0–15.0)
MCH: 28.5 pg (ref 26.0–34.0)
MCHC: 33.4 g/dL (ref 30.0–36.0)
MCV: 85.2 fL (ref 78.0–100.0)
PLATELETS: 293 10*3/uL (ref 150–400)
RBC: 4.6 MIL/uL (ref 3.87–5.11)
RDW: 16.1 % — AB (ref 11.5–15.5)
WBC: 8.7 10*3/uL (ref 4.0–10.5)

## 2016-09-20 LAB — AMMONIA: Ammonia: 30 umol/L (ref 9–35)

## 2016-09-20 MED ORDER — LORAZEPAM 2 MG/ML IJ SOLN
1.0000 mg | Freq: Once | INTRAMUSCULAR | Status: AC
  Administered 2016-09-21: 1 mg via INTRAVENOUS
  Filled 2016-09-20: qty 1

## 2016-09-20 MED ORDER — BUPROPION HCL ER (XL) 150 MG PO TB24
150.0000 mg | ORAL_TABLET | Freq: Every day | ORAL | Status: DC
  Administered 2016-09-20 – 2016-09-23 (×4): 150 mg via ORAL
  Filled 2016-09-20 (×4): qty 1

## 2016-09-20 MED ORDER — BENZTROPINE MESYLATE 1 MG PO TABS
1.0000 mg | ORAL_TABLET | Freq: Two times a day (BID) | ORAL | Status: DC
  Administered 2016-09-20 – 2016-09-22 (×7): 1 mg via ORAL
  Filled 2016-09-20 (×7): qty 1

## 2016-09-20 MED ORDER — PALIPERIDONE ER 6 MG PO TB24
6.0000 mg | ORAL_TABLET | Freq: Every day | ORAL | Status: DC
  Administered 2016-09-20 – 2016-09-23 (×4): 6 mg via ORAL
  Filled 2016-09-20 (×4): qty 1

## 2016-09-20 MED ORDER — TEMAZEPAM 15 MG PO CAPS
15.0000 mg | ORAL_CAPSULE | Freq: Every day | ORAL | Status: DC
  Administered 2016-09-20 – 2016-09-22 (×4): 15 mg via ORAL
  Filled 2016-09-20 (×4): qty 1

## 2016-09-20 MED ORDER — LISINOPRIL 20 MG PO TABS
20.0000 mg | ORAL_TABLET | Freq: Every day | ORAL | Status: DC
  Administered 2016-09-20 – 2016-09-23 (×4): 20 mg via ORAL
  Filled 2016-09-20 (×4): qty 1

## 2016-09-20 MED ORDER — ACETAMINOPHEN 325 MG PO TABS: 650.0000 mg | ORAL_TABLET | Freq: Four times a day (QID) | ORAL | Status: DC | PRN

## 2016-09-20 MED ORDER — ONDANSETRON HCL 4 MG/2ML IJ SOLN: 4.0000 mg | Freq: Four times a day (QID) | INTRAMUSCULAR | Status: DC | PRN

## 2016-09-20 MED ORDER — LACTULOSE 10 GM/15ML PO SOLN
10.0000 g | Freq: Every day | ORAL | Status: DC
  Administered 2016-09-20 – 2016-09-23 (×4): 10 g via ORAL
  Filled 2016-09-20 (×4): qty 15

## 2016-09-20 MED ORDER — PNEUMOCOCCAL VAC POLYVALENT 25 MCG/0.5ML IJ INJ
0.5000 mL | INJECTION | INTRAMUSCULAR | Status: AC
  Administered 2016-09-21: 0.5 mL via INTRAMUSCULAR
  Filled 2016-09-20: qty 0.5

## 2016-09-20 MED ORDER — MEMANTINE HCL 10 MG PO TABS
5.0000 mg | ORAL_TABLET | Freq: Every day | ORAL | Status: DC
  Administered 2016-09-20 – 2016-09-22 (×4): 5 mg via ORAL
  Filled 2016-09-20 (×4): qty 1

## 2016-09-20 MED ORDER — GABAPENTIN 300 MG PO CAPS
300.0000 mg | ORAL_CAPSULE | Freq: Every day | ORAL | Status: DC
  Administered 2016-09-20 – 2016-09-23 (×4): 300 mg via ORAL
  Filled 2016-09-20 (×4): qty 1

## 2016-09-20 MED ORDER — INSULIN ASPART 100 UNIT/ML ~~LOC~~ SOLN
0.0000 [IU] | Freq: Three times a day (TID) | SUBCUTANEOUS | Status: DC
  Administered 2016-09-20: 7 [IU] via SUBCUTANEOUS
  Administered 2016-09-20 – 2016-09-21 (×3): 2 [IU] via SUBCUTANEOUS
  Administered 2016-09-22: 1 [IU] via SUBCUTANEOUS
  Administered 2016-09-22: 2 [IU] via SUBCUTANEOUS
  Administered 2016-09-23 (×2): 1 [IU] via SUBCUTANEOUS

## 2016-09-20 MED ORDER — ENOXAPARIN SODIUM 40 MG/0.4ML ~~LOC~~ SOLN
40.0000 mg | Freq: Every day | SUBCUTANEOUS | Status: DC
  Administered 2016-09-20 – 2016-09-23 (×4): 40 mg via SUBCUTANEOUS
  Filled 2016-09-20 (×4): qty 0.4

## 2016-09-20 MED ORDER — ONDANSETRON HCL 4 MG PO TABS: 4.0000 mg | ORAL_TABLET | Freq: Four times a day (QID) | ORAL | Status: DC | PRN

## 2016-09-20 MED ORDER — ACETAMINOPHEN 650 MG RE SUPP: 650.0000 mg | Freq: Four times a day (QID) | RECTAL | Status: DC | PRN

## 2016-09-20 MED ORDER — ADULT MULTIVITAMIN W/MINERALS CH
1.0000 | ORAL_TABLET | Freq: Every day | ORAL | Status: DC
  Administered 2016-09-20 – 2016-09-23 (×4): 1 via ORAL
  Filled 2016-09-20 (×4): qty 1

## 2016-09-20 MED ORDER — THIAMINE HCL 100 MG/ML IJ SOLN
100.0000 mg | Freq: Every day | INTRAMUSCULAR | Status: DC
  Administered 2016-09-20 – 2016-09-21 (×2): 100 mg via INTRAVENOUS
  Filled 2016-09-20 (×2): qty 2

## 2016-09-20 MED ORDER — TECHNETIUM TC 99M MEBROFENIN IV KIT
5.4000 | PACK | Freq: Once | INTRAVENOUS | Status: AC | PRN
  Administered 2016-09-20: 5.4 via INTRAVENOUS

## 2016-09-20 MED ORDER — TRAZODONE HCL 50 MG PO TABS
50.0000 mg | ORAL_TABLET | Freq: Every day | ORAL | Status: DC
Start: 2016-09-20 — End: 2016-09-23
  Administered 2016-09-20 – 2016-09-22 (×4): 50 mg via ORAL
  Filled 2016-09-20 (×4): qty 1

## 2016-09-20 MED ORDER — DISULFIRAM 250 MG PO TABS: 250.0000 mg | ORAL_TABLET | ORAL | Status: DC

## 2016-09-20 MED ORDER — HYDRALAZINE HCL 20 MG/ML IJ SOLN: 10.0000 mg | INTRAMUSCULAR | Status: DC | PRN

## 2016-09-20 NOTE — Progress Notes (Signed)
[facility]                                                                              Patient Demographics  Kristen Colon, is a 66 y.o. female, DOB - Sep 24, 1950, OYD:[id number]  Admit date - 09/19/2016   Admitting Physician Rise Patience, MD  Outpatient Primary MD for the patient is Everardo Beals, NP  Outpatient specialists:   LOS - 0  days   Medical records reviewed and are as summarized below:    Chief Complaint  Patient presents with  . Hypertension       Brief summary   Per admit note by Dr. Hal Hope on 9/1 Kristen Colon is a 67 y.o. female with  history of schizophrenia, diabetes mellitus, hypertension was recently discharged from psychiatric facility from [facility] as per the daughter, pt was found to be increasingly confused over the last 2 days. Patient was discharged week ago. Patient blood pressure also was found to be elevated. Patient had gone to her PCP last week and patient's lisinopril dose was increased. In addition patient has been having episodes of vomiting with abdominal discomfort and abdominal distention.  In the ER patient was noted to have episodes of confusion. As per the daughter patient's only new medications after admissions recently was Wellbutrin and Antabuse. Patient has not taken any alcohol for last 3 weeks. CT head is unremarkable. CT abdomen shows large ovarian mass and gallbladder stones. On my exam patient appears to be oriented and follows commands.   Assessment & Plan    Principal Problem:   Acute encephalopathy/Delirium, gait instability in the setting of schizophrenia, dementia - Still somewhat confused, possibly due to medications, psychiatric issue. 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 Alvy Bimler and gyn-onc, Dr Denman George for outpatient follow-up. Dr. Burr Medico felt that if patient is still having abdominal pain, then will see inpatient tomorrow     Hypertension, uncontrolled - somewhat elevated, add Norvasc - Continue lisinopril    Diabetes mellitus type 2 in nonobese ([place]) - continue sliding scale insulin - hold metformin  Code Status: full  DVT Prophylaxis:  Lovenox  Family Communication: Discussed in detail with the patient, all imaging results, lab results explained to the patient    Disposition Plan  Time Spent in minutes 25 minutes  Procedures:  HIDA scan: normal   Consultants:   Oncology  Antimicrobials:      Medications  Scheduled Meds: . benztropine  1 mg Oral BID  . buPROPion  150 mg Oral Daily  . disulfiram  250 mg Oral QODAY  . enoxaparin (LOVENOX) injection  40 mg Subcutaneous Daily  . gabapentin  300 mg Oral Daily  . insulin aspart  0-9 Units  Subcutaneous TID WC  . lactulose  10 g Oral Daily  . lisinopril  20 mg Oral Daily  . memantine  5 mg Oral QHS  . multivitamin with minerals  1 tablet Oral Daily  . paliperidone  6 mg Oral Q supper  . [START ON 09/21/2016] pneumococcal 23 valent vaccine  0.5 mL Intramuscular Tomorrow-1000  . temazepam  15 mg Oral QHS  . thiamine injection  100 mg Intravenous Daily  . traZODone  50 mg Oral QHS   Continuous Infusions: PRN Meds:.acetaminophen **OR** acetaminophen, hydrALAZINE, ondansetron **OR** ondansetron (ZOFRAN) IV   Antibiotics   Anti-infectives    None        Subjective:   [name] was seen and examined today.  Still appears to be somewhat confused, no abdominal pain but having some nausea. No vomiting, fevers. Patient denies dizziness, chest pain, shortness of breath, new weakness, numbess, tingling. No  acute events overnight.    Objective:   Vitals:   09/19/16 2215 09/19/16 2230 09/20/16 0208 09/20/16 1247  BP: (!) 181/87 (!) 186/89 (!) 156/77 (!) 150/80  Pulse: 84 79 79   Resp:   14   Temp:   97.6 F (36.4 C)   TempSrc:   Oral   SpO2: 99% 98% 99%   Weight:   57.4 kg (126 lb 8 oz)   Height:        Intake/Output Summary (Last 24 hours) at 09/20/16 1346 Last data filed at 09/20/16 1030  Gross per 24 hour  Intake              360 ml  Output                0 ml  Net              360 ml     Wt Readings from Last 3 Encounters:  09/20/16 57.4 kg (126 lb 8 oz)  09/06/14 54.9 kg (121 lb)  03/14/14 55.8 kg (123 lb)     Exam  General: Alert and oriented x self and place, NAD  Eyes:   HEENT:   Cardiovascular: S1 S2 auscultated, no rubs, murmurs or gallops. Regular rate and rhythm.  Respiratory: Clear to auscultation bilaterally, no wheezing, rales or rhonchi  Gastrointestinal: Soft, nontender, distended, + bowel sounds  Ext: no pedal edema bilaterally  Neuro: AAOx3, Cr N's II- XII. Strength 5/5 upper and lower extremities bilaterally, speech clear, sensations grossly intact  Musculoskeletal: No digital cyanosis, clubbing  Skin: No rashes  Psych: slightly confused, alert and oriented x2   Data Reviewed:  I have personally reviewed following labs and imaging studies  Micro Results No results found for this or any previous visit (from the past 240 hour(s)).  Radiology Reports Ct Head Wo Contrast  Result Date: 09/19/2016 CLINICAL DATA:  Increased confusion EXAM: CT HEAD WITHOUT CONTRAST TECHNIQUE: Contiguous axial images were obtained from the base of the skull through the vertex without intravenous contrast. COMPARISON:  12/26/2014 FINDINGS: Brain: No acute territorial infarction, hemorrhage or mass is seen. Minimal small vessel ischemic changes of the white matter. 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 Stable ventricle size. Mild to moderate atrophy.  Vascular: No hyperdense vessels. Scattered calcifications at the carotid siphons. Skull: Normal. Negative for fracture or focal lesion. Sinuses/Orbits: Mild mucosal thickening in the ethmoid sinuses. Old appearing deformity medial wall left orbit. No acute orbital abnormality. Other: None IMPRESSION: 1. No CT evidence for acute intracranial abnormality. Electronically Signed  By: Donavan Foil M.D.   On: 09/19/2016 22:06   Ct Abdomen Pelvis W Contrast  Result Date: 09/19/2016 CLINICAL DATA:  Distended abdomen with nausea and vomiting EXAM: CT ABDOMEN AND PELVIS WITH CONTRAST TECHNIQUE: Multidetector CT imaging of the abdomen and pelvis was performed using the standard protocol following bolus administration of intravenous contrast. CONTRAST:  139mL ISOVUE-300 IOPAMIDOL (ISOVUE-300) INJECTION 61% COMPARISON:  04/24/2014 FINDINGS: Lower chest: Lung bases demonstrate no consolidation or pleural effusion. The heart size is within normal limits. Hepatobiliary: Stable hypodensity measuring 13 mm within the left hepatic lobe, possible hemangioma. Slight increased size of a lobulated cyst along the hepatic hilum measuring 24 mm compared to 21 mm previously. Multiple calcified gallstones. No biliary dilatation. Subcentimeter right posterior hepatic lobe lesion also unchanged and likely a tiny cyst. Pancreas: Unremarkable. No pancreatic ductal dilatation or surrounding inflammatory changes. Spleen: Normal in size without focal abnormality. Adrenals/Urinary Tract: Adrenal glands are unremarkable. Kidneys are normal, without renal calculi, focal lesion, or hydronephrosis. Bladder is unremarkable. Stomach/Bowel: Stomach is within normal limits. Appendix appears normal. No evidence of bowel wall thickening, distention, or inflammatory changes. Vascular/Lymphatic: Aortic atherosclerosis. No significantly enlarged lymph nodes Reproductive: Interval increase in size of a large right adnexal cystic mass measuring 10.4 x 13.4 cm,  previously 10 cm. Mass-effect with displacement of bowel loops toward the periphery. Enlarged left ovary with multiple low-density lesions. Uterus contains calcified fibroids. Other: Trace fluid around the large ovarian mass. Negative for free air. Hazy somewhat nodular density within the anterior omentum but significant respiratory motion artifact Musculoskeletal: No acute or significant osseous findings. IMPRESSION: 1. 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 Enlarged left ovary also containing multiple low-density lesions. Surgical consultation recommended. There is hazy and nodular infiltrated appearance of the anterior omentum however uncertain if this is largely related to respiratory motion artifact ; metastatic disease could also produce this appearance 2. Slight increase in size of hepatic cysts. Stable probable hemangioma in the left lobe 3. Small amount of free fluid in the pelvis 4. Gallstones Electronically Signed   By: Donavan Foil M.D.   On: 09/19/2016 22:18   Nm Hepato W/eject Fract  Result Date: 09/20/2016 CLINICAL DATA:  confused over the last 2 days. Patient has been having episodes of vomiting with abdominal discomfort and abdominal distention. CT abdomen shows large ovarian mass and gallbladder stones. oriented and follows commands. Pt historyHTN, DM, dementia, alcohol use EXAM: NUCLEAR MEDICINE HEPATOBILIARY IMAGING WITH GALLBLADDER EF TECHNIQUE: Sequential images of the abdomen were obtained out to 60 minutes following intravenous administration of radiopharmaceutical. After oral ingestion of Ensure, gallbladder ejection fraction was determined. At 60 min, normal ejection fraction is greater than 33%. Technologist relates no evident pain or discomfort post ingestion. RADIOPHARMACEUTICALS:  5.4 mCi Tc-75m  Choletec IV COMPARISON:  None. FINDINGS: Prompt uptake and biliary excretion of activity by the liver is  seen. Gallbladder activity is visualized, consistent with patency of cystic duct. Biliary activity passes into small bowel, consistent with patent common bile duct. Calculated gallbladder ejection fraction is 35%. (Normal gallbladder ejection fraction with Ensure is greater than 33%.) IMPRESSION: Normal Electronically Signed   By: Lucrezia Europe M.D.   On: 09/20/2016 11:40    Lab Data:  CBC:  Recent Labs Lab 09/19/16 1437 09/20/16 0625  WBC 9.1 8.7  HGB 13.6 13.1  HCT 40.7 39.2  MCV 86.0 85.2  PLT 289 419   Basic Metabolic  Panel:  Recent Labs Lab 09/19/16 1437 09/20/16 0625  NA 137 137  K 4.3 3.8  CL 105 102  CO2 24 32  GLUCOSE 156* 253*  BUN 9 13  CREATININE 0.70 0.76  CALCIUM 9.6 9.2   GFR: Estimated Creatinine Clearance: 62.2 mL/min (by C-G formula based on SCr of 0.76 mg/dL). Liver Function Tests:  Recent Labs Lab 09/19/16 1437  AST 28  ALT 35  ALKPHOS 87  BILITOT 0.5  PROT 7.4  ALBUMIN 3.8    Recent Labs Lab 09/19/16 1437  LIPASE 26    Recent Labs Lab 09/20/16 0004  AMMONIA 30   Coagulation Profile: No results for input(s): INR, PROTIME in the last 168 hours. Cardiac Enzymes: No results for input(s): CKTOTAL, CKMB, CKMBINDEX, TROPONINI in the last 168 hours. BNP (last 3 results) No results for input(s): PROBNP in the last 8760 hours. HbA1C: No results for input(s): HGBA1C in the last 72 hours. CBG:  Recent Labs Lab 09/20/16 0211 09/20/16 0800 09/20/16 1206  GLUCAP 118* 183* 310*   Lipid Profile: No results for input(s): CHOL, HDL, LDLCALC, TRIG, CHOLHDL, LDLDIRECT in the last 72 hours. Thyroid Function Tests: No results for input(s): TSH, T4TOTAL, FREET4, T3FREE, THYROIDAB in the last 72 hours. Anemia Panel: No results for input(s): VITAMINB12, FOLATE, FERRITIN, TIBC, IRON, RETICCTPCT in the last 72 hours. Urine analysis:    Component Value Date/Time   COLORURINE YELLOW 09/19/2016 1508   APPEARANCEUR HAZY (A) 09/19/2016 1508    LABSPEC 1.016 09/19/2016 1508   PHURINE 6.0 09/19/2016 1508   GLUCOSEU 50 (A) 09/19/2016 1508   HGBUR NEGATIVE 09/19/2016 1508   BILIRUBINUR NEGATIVE 09/19/2016 1508   KETONESUR NEGATIVE 09/19/2016 1508   PROTEINUR NEGATIVE 09/19/2016 1508   UROBILINOGEN 0.2 10/23/2014 1102   NITRITE NEGATIVE 09/19/2016 1508   LEUKOCYTESUR NEGATIVE 09/19/2016 1508     Sarrinah Gardin M.D. [facility] 09/20/2016, 1:46 PM  Pager: [phone] Between 7am to 7pm - call Pager - [phone]  After 7pm go to [email or web address] - password [email or web address]  Call night coverage person covering after 7pm

## 2016-09-20 NOTE — Progress Notes (Signed)
Pt admitted to [facility] from ED. Pt's daughter at bedside. Pt lives at home with daughter. Pt is A&Ox3. Pt has 2 callus areas on bottom of left foot and scabbed area to left buttocks. Oriented daughter and pt to room. Told pt to call for assistance before getting up. Pt stated understanding and pt showed nurse how to use call button. Placed pt on bed alarm. Will continue to monitor pt. Ranelle Oyster, RN

## 2016-09-20 NOTE — H&P (Signed)
History and Physical    Kristen Colon VZD:[id number] DOB: Mar 19, 1950 DOA: 09/19/2016  PCP: Everardo Beals, NP  Patient coming from: Home.  History obtained from patient's daughter and ER physician.   Chief Complaint:  Confusion.  HPI: Kristen Colon is a 66 y.o. female with  history of schizophrenia, diabetes mellitus, hypertension was recently discharged from psychiatric facility from [facility] as per the daughter and his living with her was found to be increasingly confused over the last 2 days. Patient was discharged week ago. Patient blood pressure also was found to be elevated. Patient had gone to her PCP last week and patient's lisinopril dose was increased. In addition patient has been having episodes of vomiting with abdominal discomfort and abdominal distention.   ED Course:  In the ER patient was noted to have episodes of confusion. As per the daughter patient's only new medications after admissions recently was Wellbutrin and Antabuse. Patient has not taken any alcohol for last 3 weeks. My levels have been normal in the ER. CT head is unremarkable. CT abdomen shows large ovarian mass and gallbladder stones. On my exam patient appears to be oriented and follows commands.  Review of Systems: As per HPI, rest all negative.   Past Medical History:  Diagnosis Date  . Dementia   . Diabetes mellitus   . Hypertension   . Schizophrenia ([place])     History reviewed. No pertinent surgical history.   reports that she has been smoking Cigarettes.  She has been smoking about 0.20 packs per day. She has never used smokeless tobacco. She reports that she drinks alcohol. She reports that she uses drugs, including Marijuana.  No Known Allergies  Family History  Problem Relation Age of Onset  . Heart disease Mother   . Diabetes Sister     Prior to Admission medications   Medication Sig Start Date End Date Taking? Authorizing Provider  amantadine (SYMMETREL) 100 MG capsule  Take 100 mg by mouth 2 (two) times daily.   Yes [provider]  benztropine (COGENTIN) 1 MG tablet Take 1 mg by mouth 2 (two) times daily.  12/05/14  Yes [provider]  buPROPion (WELLBUTRIN XL) 150 MG 24 hr tablet Take 150 mg by mouth daily.   Yes [provider]  gabapentin (NEURONTIN) 300 MG capsule Take 300 mg by mouth daily.    Yes [provider]  INVEGA 6 MG 24 hr tablet Take 6 mg by mouth daily with supper. 08/12/16  Yes [provider]  INVEGA SUSTENNA 156 MG/ML SUSP injection Inject 1 mL (156 mg total) into the muscle every 30 (thirty) days. 09/05/14  Yes Dorie Rank, MD  lactulose [facility]) 10 GM/15ML solution Take 10 g by mouth daily. 08/12/16  Yes [provider]  lisinopril (PRINIVIL,ZESTRIL) 10 MG tablet Take 20 mg by mouth daily.    Yes [provider]  memantine (NAMENDA) 5 MG tablet Take 5 mg by mouth at bedtime.  08/12/16  Yes [provider]  metFORMIN (GLUCOPHAGE) 500 MG tablet Take 500 mg by mouth daily with breakfast.    Yes [provider]  mirtazapine (REMERON) 30 MG tablet Take 1 tablet by mouth every evening. 07/14/14  Yes [provider]  Multiple Vitamin (MULITIVITAMIN WITH MINERALS) TABS Take 1 tablet by mouth daily.   Yes [provider]  oxyCODONE (OXY IR/ROXICODONE) 5 MG immediate release tablet Take 1-2 tablets by mouth every 8 (eight) hours as needed. pain 12/19/14  Yes  [provider]  temazepam (RESTORIL) 15 MG capsule Take 15 mg by mouth at bedtime. 08/12/16  Yes [provider]  traZODone (DESYREL) 50 MG tablet Take 50 mg by mouth at bedtime. 08/12/16  Yes [provider]    Physical Exam: Vitals:   09/19/16 1712 09/19/16 2130 09/19/16 2215 09/19/16 2230  BP: (!) 151/79 (!) 176/93 (!) 181/87 (!) 186/89  Pulse: 78 76 84 79  Resp: 12     Temp:      TempSrc:      SpO2: 98% 99% 99% 98%  Weight:      Height:           Constitutional: Moderately built and nourished. Vitals:   09/19/16 1712 09/19/16 2130 09/19/16 2215 09/19/16 2230  BP: (!) 151/79 (!) 176/93 (!) 181/87 (!) 186/89  Pulse: 78 76 84 79  Resp: 12     Temp:      TempSrc:      SpO2: 98% 99% 99% 98%  Weight:      Height:       Eyes: Anicteric no pallor. ENMT:  No discharge from the ears eyes nose or mouth. Neck:  No mass felt. No neck rigidity. Respiratory:  No rhonchi or crepitations. Cardiovascular:  S1-S2 heard no murmurs appreciated. Abdomen:  Distended nontender bowel sounds present. Musculoskeletal:  No edema. No joint effusion. Skin:  No rash. Skin appears warm. Neurologic: Alert awake oriented to her name and place. Moves all extremities. Psychiatric:  Patient is alert awake and oriented to name and place.   Labs on Admission: I have personally reviewed following labs and imaging studies  CBC:  Recent Labs Lab 09/19/16 1437  WBC 9.1  HGB 13.6  HCT 40.7  MCV 86.0  PLT 235   Basic Metabolic Panel:  Recent Labs Lab 09/19/16 1437  NA 137  K 4.3  CL 105  CO2 24  GLUCOSE 156*  BUN 9  CREATININE 0.70  CALCIUM 9.6   GFR: Estimated Creatinine Clearance: 62.2 mL/min (by C-G formula based on SCr of 0.7 mg/dL). Liver Function Tests:  Recent Labs Lab 09/19/16 1437  AST 28  ALT 35  ALKPHOS 87  BILITOT 0.5  PROT 7.4  ALBUMIN 3.8    Recent Labs Lab 09/19/16 1437  LIPASE 26    Recent Labs Lab 09/20/16 0004  AMMONIA 30   Coagulation Profile: No results for input(s): INR, PROTIME in the last 168 hours. Cardiac Enzymes: No results for input(s): CKTOTAL, CKMB, CKMBINDEX, TROPONINI in the last 168 hours. BNP (last 3 results) No results for input(s): PROBNP in the last 8760 hours. HbA1C: No results for input(s): HGBA1C in the last 72 hours. CBG: No results for input(s): GLUCAP in the last 168 hours. Lipid Profile: No results for input(s): CHOL, HDL, LDLCALC, TRIG, CHOLHDL, LDLDIRECT in the  last 72 hours. Thyroid Function Tests: No results for input(s): TSH, T4TOTAL, FREET4, T3FREE, THYROIDAB in the last 72 hours. Anemia Panel: No results for input(s): VITAMINB12, FOLATE, FERRITIN, TIBC, IRON, RETICCTPCT in the last 72 hours. Urine analysis:    Component Value Date/Time   COLORURINE YELLOW 09/19/2016 1508   APPEARANCEUR HAZY (A) 09/19/2016 1508   LABSPEC 1.016 09/19/2016 1508   PHURINE 6.0 09/19/2016 1508   GLUCOSEU 50 (A) 09/19/2016 1508   HGBUR NEGATIVE 09/19/2016 [place] 09/19/2016 1508   KETONESUR NEGATIVE 09/19/2016 1508   PROTEINUR NEGATIVE 09/19/2016 1508   UROBILINOGEN 0.2 10/23/2014 1102   NITRITE NEGATIVE 09/19/2016 1508  LEUKOCYTESUR NEGATIVE 09/19/2016 1508   Sepsis Labs: @LABRCNTIP (procalcitonin:4,lacticidven:4) )No results found for this or any previous visit (from the past 240 hour(s)).   Radiological Exams on Admission: Ct Head Wo Contrast  Result Date: 09/19/2016 CLINICAL DATA:  Increased confusion EXAM: CT HEAD WITHOUT CONTRAST TECHNIQUE: Contiguous axial images were obtained from the base of the skull through the vertex without intravenous contrast. COMPARISON:  12/26/2014 FINDINGS: Brain: No acute territorial infarction, hemorrhage or mass is seen. Minimal small vessel ischemic changes of the white matter. 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 Stable ventricle size. Mild to moderate atrophy. Vascular: No hyperdense vessels. Scattered calcifications at the carotid siphons. Skull: Normal. Negative for fracture or focal lesion. Sinuses/Orbits: Mild mucosal thickening in the ethmoid sinuses. Old appearing deformity medial wall left orbit. No acute orbital abnormality. Other: None IMPRESSION: 1. No CT evidence for acute intracranial abnormality. Electronically Signed   By: Donavan Foil M.D.   On: 09/19/2016 22:06     Assessment/Plan Principal Problem:   Acute encephalopathy Active Problems:   Schizophrenia,  undifferentiated (HCC)   Ovarian mass   Non-intractable vomiting with nausea   Hypertension, uncontrolled   Diabetes mellitus type 2 in nonobese ([place])    1. Acute encephalopathy/confusional state/delirium - suspect may be related to medications or her psychiatric issues. May discuss with psychiatrist in a.m. Thiamine levels are pending. 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 And is on Antabuse. Antabuse dose will be dosed per pharmacy. Only other medication patient is recently changed was on Wellbutrin. Since patient also has difficulty with gait MRI brain has been ordered. 2. Abdominal distention with ovarian mass and gallstones - will need referral to OB/GYN oncology. For gallstones I have ordered a HIDA scan. 3. Hypertension uncontrolled - patient's lisinopril was recently increased. Which will be continued. I have added when necessary IV hydralazine. 4. Diabetes mellitus type 2 - will keep patient on sliding scale coverage. 5. Dementia - on Namenda.   DVT prophylaxis: Lovenox. Code Status: Full code.  Family Communication: Patient's daughter.  Disposition Plan: Home.  Consults called: None.  Admission status: Observation.    Rise Patience MD [facility] Pager [phone].  If 7PM-7AM, please contact night-coverage [email or web address] Password [email or web address]  09/20/2016, 1:53 AM

## 2016-09-21 ENCOUNTER — Inpatient Hospital Stay (HOSPITAL_COMMUNITY): Payer: Medicare Other

## 2016-09-21 DIAGNOSIS — R410 Disorientation, unspecified: Secondary | ICD-10-CM | POA: Diagnosis not present

## 2016-09-21 DIAGNOSIS — G9340 Encephalopathy, unspecified: Secondary | ICD-10-CM | POA: Diagnosis not present

## 2016-09-21 DIAGNOSIS — E119 Type 2 diabetes mellitus without complications: Secondary | ICD-10-CM | POA: Diagnosis not present

## 2016-09-21 DIAGNOSIS — I10 Essential (primary) hypertension: Secondary | ICD-10-CM | POA: Diagnosis not present

## 2016-09-21 LAB — BASIC METABOLIC PANEL
Anion gap: 10 (ref 5–15)
BUN: 14 mg/dL (ref 6–20)
CHLORIDE: 105 mmol/L (ref 101–111)
CO2: 22 mmol/L (ref 22–32)
CREATININE: 0.69 mg/dL (ref 0.44–1.00)
Calcium: 9 mg/dL (ref 8.9–10.3)
GFR calc Af Amer: >60 mL/min (ref >60)
GFR calc non Af Amer: >60 mL/min (ref >60)
GLUCOSE: 170 mg/dL — AB (ref 65–99)
POTASSIUM: 4.3 mmol/L (ref 3.5–5.1)
SODIUM: 137 mmol/L (ref 135–145)

## 2016-09-21 LAB — GLUCOSE, CAPILLARY
GLUCOSE-CAPILLARY: 160 mg/dL — AB (ref 65–99)
GLUCOSE-CAPILLARY: 73 mg/dL (ref 65–99)
Glucose-Capillary: 158 mg/dL — ABNORMAL HIGH (ref 65–99)

## 2016-09-21 LAB — VITAMIN B12: VITAMIN B 12: 2728 pg/mL — AB (ref 180–914)

## 2016-09-21 MED ORDER — AMLODIPINE BESYLATE 5 MG PO TABS
5.0000 mg | ORAL_TABLET | Freq: Every day | ORAL | Status: DC
  Administered 2016-09-21 – 2016-09-23 (×3): 5 mg via ORAL
  Filled 2016-09-21 (×3): qty 1

## 2016-09-21 MED ORDER — VITAMIN B-1 100 MG PO TABS
100.0000 mg | ORAL_TABLET | Freq: Every day | ORAL | Status: DC
  Administered 2016-09-22 – 2016-09-23 (×2): 100 mg via ORAL
  Filled 2016-09-21 (×2): qty 1

## 2016-09-21 NOTE — Progress Notes (Addendum)
Dtr Kristen Colon called regarding the status of her mother Kristen Colon, asking about her B/P's and any further tests. Discussed the MRI and ativan given for pt to make it through the MRI scan last night, and how the patient is still slightly groggy from the effects of the ativan. Mentioned the patient possibly being discharged to day and Dtr became upset, stating pt's B/P is the reason for her being brought to ED, also wanted her ovarian mass addressed prior to d/c, requested the MD call her, I contacted MD regarding Dtr's concerns. MD said they would call Dtr back in an hour. Called Dtr and gave her the information of when to expect the phone call. Also when texting Dr Tana Coast, I read back the message including the Dtr Kristen Colon's Phone number which I was sending to Dr Tana Coast to call regarding the situation with dtr's mother I talked with Dr Tana Coast via phone, Dr Tana Coast did a consult with Dr Burr Medico oncologist regarding [name] prior to calling Dtr,  Kristen Colon the Dtr did not answer, Dr Tana Coast left detailed message to [facility], regarding patient, via answering machine/service.

## 2016-09-21 NOTE — Progress Notes (Signed)
[facility]                                                                              Patient Demographics  Kristen Colon, is a 66 y.o. female, DOB - [birth year], ION:[id number]  Admit date - 09/19/2016   Admitting Physician Rise Patience, MD  Outpatient Primary MD for the patient is Everardo Beals, NP  Outpatient specialists:   LOS - 1  days   Medical records reviewed and are as summarized below:    Chief Complaint  Patient presents with  . Hypertension       Brief summary   Per admit note by Dr. Hal Hope on 9/1 Kristen Colon is a 66 y.o. female with  history of schizophrenia, diabetes mellitus, hypertension was recently discharged from psychiatric facility from [facility] as per the daughter, pt was found to be increasingly confused over the last 2 days. Patient was discharged week ago. Patient blood pressure also was found to be elevated. Patient had gone to her PCP last week and patient's lisinopril dose was increased. In addition patient has been having episodes of vomiting with abdominal discomfort and abdominal distention.  In the ER patient was noted to have episodes of confusion. As per the daughter patient's only new medications after admissions recently was Wellbutrin and Antabuse. Patient has not taken any alcohol for last 3 weeks. CT head is unremarkable. CT abdomen shows large ovarian mass and gallbladder stones. On my exam patient appears to be oriented and follows commands.   Assessment & Plan    Principal Problem:   Acute encephalopathy/Delirium, gait instability in the setting of schizophrenia, dementia - confused, possibly due to medications, psychiatric issue. 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 Left detailed message on [name]'s phone.    Disposition Plan  Time Spent in minutes 25 minutes  Procedures:  HIDA scan: normal   Consultants:   Oncology  Antimicrobials:      Medications  Scheduled Meds: . benztropine  1 mg Oral BID  . buPROPion  150 mg Oral Daily  . disulfiram  250 mg Oral QODAY  . enoxaparin (LOVENOX) injection  40 mg Subcutaneous Daily  .  gabapentin  300 mg Oral Daily  . insulin aspart  0-9 Units Subcutaneous TID WC  . lactulose  10 g Oral Daily  . lisinopril  20 mg Oral Daily  . memantine  5 mg Oral QHS  . multivitamin with minerals  1 tablet Oral Daily  . paliperidone  6 mg Oral Q supper  . temazepam  15 mg Oral QHS  . [START ON 09/22/2016] thiamine  100 mg Oral Daily  . traZODone  50 mg Oral QHS   Continuous Infusions: PRN Meds:.acetaminophen **OR** acetaminophen, hydrALAZINE, ondansetron **OR** ondansetron (ZOFRAN) IV   Antibiotics   Anti-infectives    None        Subjective:   Kristen Colon was seen and examined today twice.  Somnolent today but able to arouse, falling back asleep. No fever  Objective:   Vitals:   09/20/16 2207 09/21/16 0011 09/21/16 0558 09/21/16 1426  BP: (!) 169/83 135/85 133/88 (!) 148/73  Pulse: 80 82 77 83  Resp:  17  17 20   Temp: 98.1 F (36.7 C)  (!) 97.4 F (36.3 C) 98.8 F (37.1 C)  TempSrc: Oral  Axillary Oral  SpO2: 97%  96% 97%  Weight:      Height:        Intake/Output Summary (Last 24 hours) at 09/21/16 1429 Last data filed at 09/21/16 0102  Gross per 24 hour  Intake              480 ml  Output                0 ml  Net              480 ml     Wt Readings from Last 3 Encounters:  09/20/16 57.4 kg (126 lb 8 oz)  09/06/14 54.9 kg (121 lb)  03/14/14 55.8 kg (123 lb)     Exam   General: somnolent, arousable, NAD  Eyes: PERRLA, EOMI, Anicteric Sclera,   HEENT:  Atraumatic, normocephalic  Cardiovascular: S1 S2 auscultated, no rubs, murmurs or gallops. Regular rate and rhythm. No pedal edema b/l  Respiratory: Clear to auscultation bilaterally, no wheezing, rales or rhonchi  Gastrointestinal: Soft, nontender, nondistended, + bowel sounds  Ext: no pedal edema bilaterally  Neuro: unable to asses  Musculoskeletal: No digital cyanosis, clubbing  Skin: No rashes  Psych: somnolent     Data Reviewed:  I have personally reviewed following labs and imaging studies  Micro Results No results found for this or any previous visit (from the past 240 hour(s)).  Radiology Reports Ct Head Wo Contrast  Result Date: 09/19/2016 CLINICAL DATA:  Increased confusion EXAM: CT HEAD WITHOUT CONTRAST TECHNIQUE: Contiguous axial images were obtained from the base of the skull through the vertex without intravenous contrast. COMPARISON:  12/26/2014 FINDINGS: Brain: No acute territorial infarction, hemorrhage or mass is seen. Minimal small vessel ischemic changes of the white matter. 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 Stable ventricle size. Mild to moderate atrophy. Vascular: No hyperdense vessels. Scattered calcifications at the carotid siphons. Skull: Normal. Negative for fracture or focal lesion. Sinuses/Orbits: Mild mucosal thickening in the ethmoid sinuses. Old appearing  deformity medial wall left orbit. No acute orbital abnormality. Other: None IMPRESSION: 1. No CT evidence for acute intracranial abnormality. Electronically Signed   By: Donavan Foil M.D.   On: 09/19/2016 22:06   Mr Brain Wo Contrast  Result Date: 09/21/2016 CLINICAL DATA:  66 y/o F; 2 days of confusion. History of  schizophrenia, diabetes, and hypertension. EXAM: MRI HEAD WITHOUT CONTRAST TECHNIQUE: Multiplanar, multiecho pulse sequences of the brain and surrounding structures were obtained without intravenous contrast. COMPARISON:  09/19/2016 CT head.  01/19/2010 MRI of the head. FINDINGS: Brain: No acute infarction, hemorrhage, hydrocephalus, extra-axial collection or mass lesion. Few nonspecific foci of T2 FLAIR hyperintense signal abnormality in subcortical and periventricular white matter is compatible with mild chronic microvascular ischemic changes for age. Moderate brain parenchymal volume loss. Mild progression from 2011. Punctate focus of susceptibility hypointensity within left parietal centrum semiovale compatible with hemosiderin deposition of chronic microhemorrhage. Vascular: Normal flow voids. Skull and upper cervical spine: Normal marrow signal. Sinuses/Orbits: Negative. Other: None. IMPRESSION: 1. No acute intracranial abnormality identified. 2. Mild chronic microvascular ischemic changes and moderate parenchymal volume loss of the brain. Mild interval progression from 2011. Electronically Signed   By: Kristine Garbe M.D.   On: 09/21/2016 05:24   Ct Abdomen Pelvis W Contrast  Result Date: 09/19/2016 CLINICAL DATA:  Distended abdomen with nausea and vomiting EXAM: CT ABDOMEN AND PELVIS WITH CONTRAST TECHNIQUE: Multidetector CT imaging of the abdomen and pelvis was performed using the standard protocol following bolus administration of intravenous contrast. CONTRAST:  14mL ISOVUE-300 IOPAMIDOL (ISOVUE-300) INJECTION 61% COMPARISON:  04/24/2014 FINDINGS: Lower chest: Lung bases  demonstrate no consolidation or pleural effusion. The heart size is within normal limits. Hepatobiliary: Stable hypodensity measuring 13 mm within the left hepatic lobe, possible hemangioma. Slight increased size of a lobulated cyst along the hepatic hilum measuring 24 mm compared to 21 mm previously. Multiple calcified gallstones. No biliary dilatation. Subcentimeter right posterior hepatic lobe lesion also unchanged and likely a tiny cyst. Pancreas: Unremarkable. No pancreatic ductal dilatation or surrounding inflammatory changes. Spleen: Normal in size without focal abnormality. Adrenals/Urinary Tract: Adrenal glands are unremarkable. Kidneys are normal, without renal calculi, focal lesion, or hydronephrosis. Bladder is unremarkable. Stomach/Bowel: Stomach is within normal limits. Appendix appears normal. No evidence of bowel wall thickening, distention, or inflammatory changes. Vascular/Lymphatic: Aortic atherosclerosis. No significantly enlarged lymph nodes Reproductive: Interval increase in size of a large right adnexal cystic mass measuring 10.4 x 13.4 cm, previously 10 cm. Mass-effect with displacement of bowel loops toward the periphery. Enlarged left ovary with multiple low-density lesions. Uterus contains calcified fibroids. Other: Trace fluid around the large ovarian mass. Negative for free air. Hazy somewhat nodular density within the anterior omentum but significant respiratory motion artifact Musculoskeletal: No acute or significant osseous findings. IMPRESSION: 1. 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 Enlarged left ovary also containing multiple low-density lesions. Surgical consultation recommended. There is hazy and nodular infiltrated appearance of the anterior omentum however uncertain if this is largely related to respiratory motion artifact ; metastatic disease could also produce this appearance 2. Slight  increase in size of hepatic cysts. Stable probable hemangioma in the left lobe 3. Small amount of free fluid in the pelvis 4. Gallstones Electronically Signed   By: Donavan Foil M.D.   On: 09/19/2016 22:18   Nm Hepato W/eject Fract  Result Date: 09/20/2016 CLINICAL DATA:  confused over the last 2 days. Patient has been having episodes of vomiting with abdominal discomfort and abdominal distention. CT abdomen shows large ovarian mass and gallbladder stones. oriented and follows commands. Pt historyHTN, DM, dementia, alcohol use EXAM: NUCLEAR MEDICINE HEPATOBILIARY IMAGING WITH GALLBLADDER EF TECHNIQUE: Sequential images of the abdomen were obtained out to 60 minutes following intravenous administration of radiopharmaceutical. After  oral ingestion of Ensure, gallbladder ejection fraction was determined. At 60 min, normal ejection fraction is greater than 33%. Technologist relates no evident pain or discomfort post ingestion. RADIOPHARMACEUTICALS:  5.4 mCi Tc-28m  Choletec IV COMPARISON:  None. FINDINGS: Prompt uptake and biliary excretion of activity by the liver is seen. Gallbladder activity is visualized, consistent with patency of cystic duct. Biliary activity passes into small bowel, consistent with patent common bile duct. Calculated gallbladder ejection fraction is 35%. (Normal gallbladder ejection fraction with Ensure is greater than 33%.) IMPRESSION: Normal Electronically Signed   By: Lucrezia Europe M.D.   On: 09/20/2016 11:40    Lab Data:  CBC:  Recent Labs Lab 09/19/16 1437 09/20/16 0625  WBC 9.1 8.7  HGB 13.6 13.1  HCT 40.7 39.2  MCV 86.0 85.2  PLT 289 794   Basic Metabolic Panel:  Recent Labs Lab 09/19/16 1437 09/20/16 0625 09/21/16 0527  NA 137 137 137  K 4.3 3.8 4.3  CL 105 102 105  CO2 24 32 22  GLUCOSE 156* 253* 170*  BUN 9 13 14   CREATININE 0.70 0.76 0.69  CALCIUM 9.6 9.2 9.0   GFR: Estimated Creatinine Clearance: 62.2 mL/min (by C-G formula based on SCr of 0.69  mg/dL). Liver Function Tests:  Recent Labs Lab 09/19/16 1437  AST 28  ALT 35  ALKPHOS 87  BILITOT 0.5  PROT 7.4  ALBUMIN 3.8    Recent Labs Lab 09/19/16 1437  LIPASE 26    Recent Labs Lab 09/20/16 0004  AMMONIA 30   Coagulation Profile: No results for input(s): INR, PROTIME in the last 168 hours. Cardiac Enzymes: No results for input(s): CKTOTAL, CKMB, CKMBINDEX, TROPONINI in the last 168 hours. BNP (last 3 results) No results for input(s): PROBNP in the last 8760 hours. HbA1C: No results for input(s): HGBA1C in the last 72 hours. CBG:  Recent Labs Lab 09/20/16 1206 09/20/16 1656 09/20/16 2205 09/21/16 0745 09/21/16 1223  GLUCAP 310* 153* 205* 160* 158*   Lipid Profile: No results for input(s): CHOL, HDL, LDLCALC, TRIG, CHOLHDL, LDLDIRECT in the last 72 hours. Thyroid Function Tests: No results for input(s): TSH, T4TOTAL, FREET4, T3FREE, THYROIDAB in the last 72 hours. Anemia Panel: No results for input(s): VITAMINB12, FOLATE, FERRITIN, TIBC, IRON, RETICCTPCT in the last 72 hours. Urine analysis:    Component Value Date/Time   COLORURINE YELLOW 09/19/2016 1508   APPEARANCEUR HAZY (A) 09/19/2016 1508   LABSPEC 1.016 09/19/2016 1508   PHURINE 6.0 09/19/2016 1508   GLUCOSEU 50 (A) 09/19/2016 1508   HGBUR NEGATIVE 09/19/2016 1508   BILIRUBINUR NEGATIVE 09/19/2016 1508   KETONESUR NEGATIVE 09/19/2016 1508   PROTEINUR NEGATIVE 09/19/2016 1508   UROBILINOGEN 0.2 10/23/2014 1102   NITRITE NEGATIVE 09/19/2016 1508   LEUKOCYTESUR NEGATIVE 09/19/2016 1508     Kassem Kibbe M.D. [facility] 09/21/2016, 2:29 PM  Pager: [phone] Between 7am to 7pm - call Pager - [phone]  After 7pm go to [email or web address] - password [email or web address]  Call night coverage person covering after 7pm

## 2016-09-22 DIAGNOSIS — F17210 Nicotine dependence, cigarettes, uncomplicated: Secondary | ICD-10-CM | POA: Diagnosis not present

## 2016-09-22 DIAGNOSIS — N83201 Unspecified ovarian cyst, right side: Secondary | ICD-10-CM | POA: Diagnosis not present

## 2016-09-22 DIAGNOSIS — F0390 Unspecified dementia without behavioral disturbance: Secondary | ICD-10-CM

## 2016-09-22 DIAGNOSIS — N839 Noninflammatory disorder of ovary, fallopian tube and broad ligament, unspecified: Secondary | ICD-10-CM

## 2016-09-22 DIAGNOSIS — R112 Nausea with vomiting, unspecified: Secondary | ICD-10-CM | POA: Diagnosis not present

## 2016-09-22 DIAGNOSIS — F258 Other schizoaffective disorders: Secondary | ICD-10-CM

## 2016-09-22 DIAGNOSIS — R410 Disorientation, unspecified: Secondary | ICD-10-CM | POA: Diagnosis not present

## 2016-09-22 DIAGNOSIS — F1290 Cannabis use, unspecified, uncomplicated: Secondary | ICD-10-CM | POA: Diagnosis not present

## 2016-09-22 DIAGNOSIS — I10 Essential (primary) hypertension: Secondary | ICD-10-CM | POA: Diagnosis not present

## 2016-09-22 DIAGNOSIS — R109 Unspecified abdominal pain: Secondary | ICD-10-CM | POA: Diagnosis not present

## 2016-09-22 DIAGNOSIS — G9340 Encephalopathy, unspecified: Secondary | ICD-10-CM | POA: Diagnosis not present

## 2016-09-22 DIAGNOSIS — R140 Abdominal distension (gaseous): Secondary | ICD-10-CM

## 2016-09-22 DIAGNOSIS — F0391 Unspecified dementia with behavioral disturbance: Secondary | ICD-10-CM | POA: Diagnosis not present

## 2016-09-22 DIAGNOSIS — E119 Type 2 diabetes mellitus without complications: Secondary | ICD-10-CM | POA: Diagnosis not present

## 2016-09-22 LAB — TSH: TSH: 1.551 u[IU]/mL (ref 0.350–4.500)

## 2016-09-22 LAB — BASIC METABOLIC PANEL
Anion gap: 9 (ref 5–15)
BUN: 12 mg/dL (ref 6–20)
CO2: 23 mmol/L (ref 22–32)
CREATININE: 0.83 mg/dL (ref 0.44–1.00)
Calcium: 9 mg/dL (ref 8.9–10.3)
Chloride: 105 mmol/L (ref 101–111)
GFR calc Af Amer: >60 mL/min (ref >60)
Glucose, Bld: 109 mg/dL — ABNORMAL HIGH (ref 65–99)
Potassium: 3.7 mmol/L (ref 3.5–5.1)
SODIUM: 137 mmol/L (ref 135–145)

## 2016-09-22 LAB — GLUCOSE, CAPILLARY
GLUCOSE-CAPILLARY: 112 mg/dL — AB (ref 65–99)
GLUCOSE-CAPILLARY: 112 mg/dL — AB (ref 65–99)
Glucose-Capillary: 122 mg/dL — ABNORMAL HIGH (ref 65–99)
Glucose-Capillary: 160 mg/dL — ABNORMAL HIGH (ref 65–99)
Glucose-Capillary: 125 mg/dL — ABNORMAL HIGH (ref 65–99)

## 2016-09-22 NOTE — Evaluation (Signed)
Physical Therapy Evaluation Patient Details Name: Kristen Colon MRN: [id number] DOB: 1950-10-19 Today's Date: 09/22/2016   History of Present Illness  Kristen Colon is a 66 y.o. female with  history of schizophrenia, diabetes mellitus, hypertension was recently discharged from psychiatric facility from [facility] as per the daughter and his living with her was found to be increasingly confused over the last 2 days.  Clinical Impression  Unsure of patients baseline mobility and home set up or support. No family available to confirm or ask. Pt however is unsafe to return home alone due to below cognitive and functional deficits. Pt is at a high fall risk. Pt would need 24/7 assist for safe d/c home at this time. Acute PT to follow to progress indep with mobility.    Follow Up Recommendations SNF;Supervision/Assistance - 24 hour    Equipment Recommendations  None recommended by PT    Recommendations for Other Services       Precautions / Restrictions Precautions Precautions: Fall Precaution Comments: psych history Restrictions Weight Bearing Restrictions: No      Mobility  Bed Mobility Overal bed mobility: Modified Independent             General bed mobility comments: used bed rail, increased time  Transfers Overall transfer level: Needs assistance Equipment used: 1 person hand held assist;2 person hand held assist;None Transfers: Sit to/from Stand Sit to Stand: Min guard         General transfer comment: pt unsteady with lateral sway  Ambulation/Gait Ambulation/Gait assistance: Min assist Ambulation Distance (Feet): 200 Feet Assistive device: None Gait Pattern/deviations: Step-through pattern;Staggering right;Staggering left Gait velocity: slow Gait velocity interpretation: Below normal speed for age/gender General Gait Details: pt with narrow base of support nearing cross over gait pattern at time, pt required minA to maintain balance due to instabiliy and  strong lateral sway/staggering  Stairs Stairs: Yes Stairs assistance: Min assist Stair Management: One rail Left;Alternating pattern Number of Stairs: 12 General stair comments: pt with episode of catching toe on stair during ascending requiring min/modA to prevent fall and had an episode of LOB while descending requiring min/modA  Wheelchair Mobility    Modified Rankin (Stroke Patients Only)       Balance Overall balance assessment: Needs assistance             Standing balance comment: pt requires physicall assist for safety                             Pertinent Vitals/Pain Pain Assessment: No/denies pain    Home Living Family/patient expects to be discharged to:: Unsure Living Arrangements: Children               Additional Comments: pt very poor historian and unsure of accuracy. Pt states she lives with her daughter who works. and she has 2 flights to get into the house with a handrail on the L, however pt isn't oriented. Pt also states she wants to stay with her daughters in [place] but she didn't raise them so she probably can't    Prior Function Level of Independence: Needs assistance   Gait / Transfers Assistance Needed: per pt she didn't use an AD however pt very unsteady  ADL's / Homemaking Assistance Needed: unsure  Comments: pt just recently d/c from Psych hospital     Hand Dominance   Dominant Hand: Right    Extremity/Trunk Assessment   Upper Extremity Assessment Upper Extremity  Assessment: Overall WFL for tasks assessed    Lower Extremity Assessment Lower Extremity Assessment: Overall WFL for tasks assessed (however suspect bilat LE neuropathy)    Cervical / Trunk Assessment Cervical / Trunk Assessment: Normal  Communication   Communication: No difficulties  Cognition Arousal/Alertness: Awake/alert Behavior During Therapy: WFL for tasks assessed/performed Overall Cognitive Status: History of cognitive impairments - at  baseline (pt with h/o dementia and schizophrenia, )                                 General Comments: no family avail to assess baseline. Pt only oriented to name, couldn't state birthday, todays date, [place] or state. pt with poor comprehension and delayed processing. pt with decreased insight to deficits and safety      General Comments      Exercises     Assessment/Plan    PT Assessment Patient needs continued PT services  PT Problem List Decreased strength;Decreased activity tolerance;Decreased balance;Decreased mobility;Decreased coordination;Decreased cognition;Decreased knowledge of use of DME;Decreased safety awareness       PT Treatment Interventions DME instruction;Gait training;Stair training;Functional mobility training;Therapeutic activities;Therapeutic exercise;Balance training;Neuromuscular re-education    PT Goals (Current goals can be found in the Care Plan section)  Acute Rehab PT Goals Patient Stated Goal: didn't state PT Goal Formulation: Patient unable to participate in goal setting Time For Goal Achievement: 09/29/16 Potential to Achieve Goals: Fair    Frequency Min 3X/week   Barriers to discharge Decreased caregiver support doesn't have 24/7 assist    Co-evaluation               AM-PAC PT "6 Clicks" Daily Activity  Outcome Measure Difficulty turning over in bed (including adjusting bedclothes, sheets and blankets)?: A Lot Difficulty moving from lying on back to sitting on the side of the bed? : A Lot Difficulty sitting down on and standing up from a chair with arms (e.g., wheelchair, bedside commode, etc,.)?: A Lot Help needed moving to and from a bed to chair (including a wheelchair)?: A Lot Help needed walking in hospital room?: A Lot Help needed climbing 3-5 steps with a railing? : A Lot 6 Click Score: 12    End of Session Equipment Utilized During Treatment: Gait belt Activity Tolerance: Patient tolerated treatment  well Patient left: in chair;with call bell/phone within reach;with chair alarm set Nurse Communication: Mobility status PT Visit Diagnosis: Unsteadiness on feet (R26.81)    Time: 6384-6659 PT Time Calculation (min) (ACUTE ONLY): 29 min   Charges:   PT Evaluation $PT Eval Moderate Complexity: 1 Mod PT Treatments $Gait Training: 8-22 mins   PT G Codes:        Kittie Plater, PT, DPT Pager #: [phone] Office #: [phone]   [place] 09/22/2016, 3:01 PM

## 2016-09-22 NOTE — Consult Note (Signed)
[facility] Consult   Reason for Consult:  History of schizophrenia, confusion Referring Physician:  Dr. Tana Coast Patient Identification: Kristen Colon MRN:  [id number] Principal Diagnosis: Acute encephalopathy Diagnosis:   Patient Active Problem List   Diagnosis Date Noted  . Ovarian mass [N83.9] 09/20/2016  . Non-intractable vomiting with nausea [R11.2] 09/20/2016  . Hypertension, uncontrolled [I10] 09/20/2016  . Diabetes mellitus type 2 in nonobese ([place]) [E11.9] 09/20/2016  . Acute encephalopathy [G93.40] 09/19/2016  . Schizoaffective disorder, chronic condition with acute exacerbation ([place]) [F25.8]   . Schizophrenia, undifferentiated ([place]) [F20.3] 06/21/2014  . Delusional disorder ([place]) [F22]   . Psychoses [F29]   . Gallbladder polyp [K82.4] 07/21/2011  . Liver mass [R16.0] 07/21/2011    Total Time spent with patient: 30 minutes  Subjective:   Kristen Colon is a 66 y.o. female patient admitted with worsening confusion, abdominal pain and distension.  HPI:  Patient is a 66 year old female. Patient is limited historian. Most information obtained from chart. As per chart notes, she has history of chronic mental illness ( Schizophrenia) and had been recently admitted to a psychiatric facility .she was discharged about a week prior to this admission. Over the last 2 days patient was noted to be increasingly confused , with elevated BP, vomiting, and abdominal pain. Report is that patient had been discharged on Wellbutrin and Antabuse.  Admission UDS positive for BZDs, admission BAL < 5. However, patient states " I did have a beer before I came here ". At this time patient is alert, attentive, mildly distractible, not agitated,  pleasant on approach, confused, knows she is in a hospital, but cannot specify further, disoriented to city or county, disoriented to time.  Patient minimizes depression, denies any suicidal ideations, and denies hallucinations/does not appear  internally preoccupied. Thought process is disorganized .   Past Psychiatric History: As per chart notes, patient had a recent psychiatric admission to [facility] in [facility]  ( 8/12-8/29/18) with a diagnosis of Schizophrenia and of Dementia.  At the time she had been missing for several days, not taking medications, and presented psychotic  when found . She was discharged with referral to ACT team and Home Care , and on Namenda, Invega Sustenna 156 mgrs IM Q month ( last dose 8/19), Invega 6 mgrs QDAY,Neurontin 300 mgrs QHS, Restoril 30 mgrs QHS , Gogentin 1 mgr BID, Lyrica 100 mgrs TID Home meds also include Antabuse 250 mgrs QDAY .    Risk to Self: Is patient at risk for suicide?: No Risk to Others:   Prior Inpatient Therapy:   Prior Outpatient Therapy:    Past Medical History:  Past Medical History:  Diagnosis Date  . Dementia   . Diabetes mellitus   . Hypertension   . Schizophrenia ([place])    History reviewed. No pertinent surgical history. Family History:  Family History  Problem Relation Age of Onset  . Heart disease Mother   . Diabetes Sister    Family Psychiatric  History: unknown Social History:  History  Alcohol Use  . Yes     History  Drug Use  . Types: Marijuana    Social History   Social History  . Marital status: Divorced    Spouse name: N/A  . Number of children: N/A  . Years of education: N/A   Social History Main Topics  . Smoking status: Current Every Day Smoker    Packs/day: 0.20    Types: Cigarettes  . Smokeless tobacco: Never  Used  . Alcohol use Yes  . Drug use: Yes    Types: Marijuana  . Sexual activity: Not Asked     Comment: quit 5 years ago   Other Topics Concern  . None   Social History Narrative  . None   Additional Social History:    Allergies:  No Known Allergies  Labs:  Results for orders placed or performed during the hospital encounter of 09/19/16 (from the past 48 hour(s))  Glucose, capillary      Status: Abnormal   Collection Time: 09/20/16  4:56 PM  Result Value Ref Range   Glucose-Capillary 153 (H) 65 - 99 mg/dL  Glucose, capillary     Status: Abnormal   Collection Time: 09/20/16 10:05 PM  Result Value Ref Range   Glucose-Capillary 205 (H) 65 - 99 mg/dL  Basic metabolic panel     Status: Abnormal   Collection Time: 09/21/16  5:27 AM  Result Value Ref Range   Sodium 137 135 - 145 mmol/L   Potassium 4.3 3.5 - 5.1 mmol/L   Chloride 105 101 - 111 mmol/L   CO2 22 22 - 32 mmol/L   Glucose, Bld 170 (H) 65 - 99 mg/dL   BUN 14 6 - 20 mg/dL   Creatinine, Ser 0.69 0.44 - 1.00 mg/dL   Calcium 9.0 8.9 - 10.3 mg/dL   GFR calc non Af Amer >60 >60 mL/min   GFR calc Af Amer >60 >60 mL/min    Comment: (NOTE) The eGFR has been calculated using the CKD EPI equation. This calculation has not been validated in all clinical situations. eGFR's persistently <60 mL/min signify possible Chronic Kidney Disease.    Anion gap 10 5 - 15  Glucose, capillary     Status: Abnormal   Collection Time: 09/21/16  7:45 AM  Result Value Ref Range   Glucose-Capillary 160 (H) 65 - 99 mg/dL  Glucose, capillary     Status: Abnormal   Collection Time: 09/21/16 12:23 PM  Result Value Ref Range   Glucose-Capillary 158 (H) 65 - 99 mg/dL  Vitamin B12     Status: Abnormal   Collection Time: 09/21/16 12:55 PM  Result Value Ref Range   Vitamin B-12 2,728 (H) 180 - 914 pg/mL    Comment: (NOTE) This assay is not validated for testing neonatal or myeloproliferative syndrome specimens for Vitamin B12 levels.   Glucose, capillary     Status: None   Collection Time: 09/21/16  5:04 PM  Result Value Ref Range   Glucose-Capillary 73 65 - 99 mg/dL  Glucose, capillary     Status: Abnormal   Collection Time: 09/21/16 11:48 PM  Result Value Ref Range   Glucose-Capillary 112 (H) 65 - 99 mg/dL  Basic metabolic panel     Status: Abnormal   Collection Time: 09/22/16  3:33 AM  Result Value Ref Range   Sodium 137 135 - 145  mmol/L   Potassium 3.7 3.5 - 5.1 mmol/L   Chloride 105 101 - 111 mmol/L   CO2 23 22 - 32 mmol/L   Glucose, Bld 109 (H) 65 - 99 mg/dL   BUN 12 6 - 20 mg/dL   Creatinine, Ser 0.83 0.44 - 1.00 mg/dL   Calcium 9.0 8.9 - 10.3 mg/dL   GFR calc non Af Amer >60 >60 mL/min   GFR calc Af Amer >60 >60 mL/min    Comment: (NOTE) The eGFR has been calculated using the CKD EPI equation. This calculation has not been validated in  all clinical situations. eGFR's persistently <60 mL/min signify possible Chronic Kidney Disease.    Anion gap 9 5 - 15  Glucose, capillary     Status: Abnormal   Collection Time: 09/22/16  7:49 AM  Result Value Ref Range   Glucose-Capillary 122 (H) 65 - 99 mg/dL  Glucose, capillary     Status: Abnormal   Collection Time: 09/22/16 12:10 PM  Result Value Ref Range   Glucose-Capillary 160 (H) 65 - 99 mg/dL  TSH     Status: None   Collection Time: 09/22/16 12:54 PM  Result Value Ref Range   TSH 1.551 0.350 - 4.500 uIU/mL    Comment: Performed by a 3rd Generation assay with a functional sensitivity of <=0.01 uIU/mL.    Current Facility-Administered Medications  Medication Dose Route Frequency Provider Last Rate Last Dose  . acetaminophen (TYLENOL) tablet 650 mg  650 mg Oral Q6H PRN Rise Patience, MD       Or  . acetaminophen (TYLENOL) suppository 650 mg  650 mg Rectal Q6H PRN Rise Patience, MD      . amLODipine (NORVASC) tablet 5 mg  5 mg Oral Daily Rai, Ripudeep K, MD   5 mg at 09/22/16 0911  . benztropine (COGENTIN) tablet 1 mg  1 mg Oral BID Rise Patience, MD   1 mg at 09/22/16 0911  . buPROPion (WELLBUTRIN XL) 24 hr tablet 150 mg  150 mg Oral Daily Rise Patience, MD   150 mg at 09/22/16 0911  . disulfiram (ANTABUSE) tablet 250 mg  250 mg Oral QODAY Rise Patience, MD      . enoxaparin (LOVENOX) injection 40 mg  40 mg Subcutaneous Daily Rise Patience, MD   40 mg at 09/22/16 0912  . gabapentin (NEURONTIN) capsule 300 mg  300 mg  Oral Daily Rise Patience, MD   300 mg at 09/22/16 7670  . hydrALAZINE (APRESOLINE) injection 10 mg  10 mg Intravenous Q4H PRN Rise Patience, MD      . insulin aspart (novoLOG) injection 0-9 Units  0-9 Units Subcutaneous TID WC Rise Patience, MD   2 Units at 09/22/16 1245  . lactulose (CHRONULAC) 10 GM/15ML solution 10 g  10 g Oral Daily Rise Patience, MD   10 g at 09/22/16 0912  . lisinopril (PRINIVIL,ZESTRIL) tablet 20 mg  20 mg Oral Daily Rise Patience, MD   20 mg at 09/22/16 0911  . memantine (NAMENDA) tablet 5 mg  5 mg Oral QHS Rise Patience, MD   5 mg at 09/21/16 2149  . multivitamin with minerals tablet 1 tablet  1 tablet Oral Daily Rise Patience, MD   1 tablet at 09/22/16 0912  . ondansetron (ZOFRAN) tablet 4 mg  4 mg Oral Q6H PRN Rise Patience, MD       Or  . ondansetron [facility]) injection 4 mg  4 mg Intravenous Q6H PRN Rise Patience, MD      . paliperidone (INVEGA) 24 hr tablet 6 mg  6 mg Oral Q supper Rise Patience, MD   6 mg at 09/21/16 1947  . temazepam (RESTORIL) capsule 15 mg  15 mg Oral QHS Rise Patience, MD   15 mg at 09/21/16 2149  . thiamine (VITAMIN B-1) tablet 100 mg  100 mg Oral Daily Bridgett Larsson, RPH   100 mg at 09/22/16 1100  . traZODone (DESYREL) tablet 50 mg  50 mg Oral QHS Gean Birchwood  N, MD   50 mg at 09/21/16 2149    Musculoskeletal: Strength & Muscle Tone: within normal limits Gait & Station: gait not examined  Patient leans: N/A  Psychiatric Specialty Exam: Physical Exam  ROS denies chest pain, denies dyspnea, states abdominal pain has subsided, no vomiting   Blood pressure 132/74, pulse 97, temperature 98.1 F (36.7 C), temperature source Oral, resp. rate 20, height _0  (1.651 m), weight 57.4 kg (126 lb 8 oz), SpO2 99 %.Body mass index is 21.05 kg/m.  General Appearance: Fairly Groomed  Eye Contact:  Fair  Speech:  Normal Rate  Volume:  Normal  Mood:  denies feeling  depressed, smiles at times during session  Affect:  reactive   Thought Process:  Disorganized and Descriptions of Associations: Tangential  Orientation:  Other:  oriented to person, disoriented to place and time   Thought Content:  denies hallucinations, and currently does not appear internally preoccupied   Suicidal Thoughts:  No denies suicidal or self injurious ideations, denies homicidal or violent ideations  Homicidal Thoughts:  No  Memory:  recent and remote limited   Judgement:  Impaired  Insight:  Lacking  Psychomotor Activity:  Normal does not appear to be in any acute distress or discomfort  Concentration:  Concentration: Fair and Attention Span: Fair  Recall:  [employer] of Knowledge:  Fair  Language:  Fair  Akathisia:  Negative  Handed:  Right  AIMS (if indicated):     Assets:  Desire for Improvement Resilience  ADL's:  Fair- improving   Cognition:  Impaired,  Moderate  Sleep:      Assessment - patient has history of Schizophrenia and of Demential as per chart, with history of recent psychiatric admission for worsening confusion, disorientation, psychosis.  At this time patient presents calm, pleasant, confused. No gross psychotic symptoms noted , and she denies hallucinations, but thought process is impoverished and tangential.She is not suicidal, not homicidal. Recent episode of increased BP, abdominal pain, vomiting could have been related to Disulfiram reaction, as patient on Antabuse and reporting she had recently consumed beer.  Currently vitals are stable  Treatment Plan Summary: as below  Disposition: No evidence of imminent risk to self or others at present.   Patient does not meet criteria for psychiatric inpatient admission. Would consider simplifying medication regimen to minimize potential side effects , particularly cognitive side effects. -Would taper Restoril down/off to minimize potential for sedation, falls, and potential increased risk of confusion on  BZDS -Would consider stopping Cogentin to avoid potential anticholinergic side effects. Would monitor for possible EPS symptoms  -Would consider stopping Antabuse, as patient's cognitive status may make it difficult for her to understand the importance of full alcohol abstinence .  Continue Namenda, Wellbutrin XL, Invega .  Would note that abdominal pain is listed as side effect for both Namenda and Wellbutrin XL- if this symptoms persists, may consider changing medications.  Due to patient's history of worsening cognitive functioning , would determine with family wether referral to NH may be appropriate disposition planning .   Psychiatry consultant will follow with you.  Jenne Campus, MD 09/22/2016 2:44 PM

## 2016-09-22 NOTE — Progress Notes (Signed)
[facility]                                                                              Patient Demographics  Kristen Colon, is a 66 y.o. female, DOB - 1950-12-18, SEG:[id number]  Admit date - 09/19/2016   Admitting Physician Rise Patience, MD  Outpatient Primary MD for the patient is Everardo Beals, NP  Outpatient specialists:   LOS - 2  days   Medical records reviewed and are as summarized below:    Chief Complaint  Patient presents with  . Hypertension       Brief summary   Per admit note by Dr. Hal Hope on 9/1 Kristen Colon is a 66 y.o. female with  history of schizophrenia, diabetes mellitus, hypertension was recently discharged from psychiatric facility from [facility] as per the daughter, pt was found to be increasingly confused over the last 2 days. Patient was discharged week ago. Patient blood pressure also was found to be elevated. Patient had gone to her PCP last week and patient's lisinopril dose was increased. In addition patient has been having episodes of vomiting with abdominal discomfort and abdominal distention.  In the ER patient was noted to have episodes of confusion. As per the daughter patient's only new medications after admissions recently was Wellbutrin and Antabuse. Patient has not taken any alcohol for last 3 weeks. CT head is unremarkable. CT abdomen shows large ovarian mass and gallbladder stones. On my exam patient appears to be oriented and follows commands.   Assessment & Plan    Principal Problem:   Acute encephalopathy/Delirium, gait instability in the setting of schizophrenia, dementia - confused at the time of admission, possibly due to medications, psychiatric issue, hypertensive urgency. Ammonia level 30, no UTI - MRI of the brain negative for acute CVA however showed mild chronic microvascular ischemic changes and moderate parenchymal volume loss of the brain, mild interval progression from 2011 -B12 2728,  B1 pending - today alert and awake and oriented, psychiatry was consulted for medication adjustment, recently started on wellbutrin and antabuse, now admitted with more confusion - BP more stable, awaiting psychiatry evaluation   - likely vascular dementia due to uncontrolled hypertension and MRI findings. Follow TSH, RPR, HIV - PTOT evaluation  Active Problems:     Abdominal pain, nausea and vomiting - CT abdomen and pelvis showed interval increase in the size of large right ovarian cystic mass now measuring 13 cm concerning for ovarian neoplasm, gallstones - HIDA scan normal - appreciate Dr. Denman George, gyn onc, for evaluating the patient. Per recommendations, appears to be benign lesion. Per Dr. Denman George no surgery needed for benign asymptomatic lesions in the setting of acute exacerbations of dementia, substance abuse and hypertension. - outpatient follow-up will be scheduled in September    Hypertension, uncontrolled, systolic in 737T at the time of admission - BP improved today, continue Norvasc, lisinopril    Diabetes mellitus type 2 in nonobese (HCC) - continue sliding scale insulin - hold metformin  History of alcohol abuse - Recently started on Antabuse, quit alcohol 2 weeks ago.  - May have underlying B1 deficiency or wernicke's encephalopathy,  continue thiamine  Code Status: full  DVT Prophylaxis:  Lovenox  Family Communication:  Called patient's daughter, Kristen Colon,explained the labs, imaging, vitals,management in detail. She requested DC tomorrow. Psych evaluation and PT evaluation pending today  Disposition Plan: likely DC home in a.m.with home health, PT evaluation pending  Time Spent in minutes 25 minutes  Procedures:  HIDA scan: normal MRI brain  Consultants:   Oncology  Antimicrobials:      Medications  Scheduled Meds: . amLODipine  5 mg Oral Daily  . benztropine  1 mg Oral BID  . buPROPion  150 mg Oral Daily  . disulfiram  250 mg Oral QODAY  . enoxaparin  (LOVENOX) injection  40 mg Subcutaneous Daily  . gabapentin  300 mg Oral Daily  . insulin aspart  0-9 Units Subcutaneous TID WC  . lactulose  10 g Oral Daily  . lisinopril  20 mg Oral Daily  . memantine  5 mg Oral QHS  . multivitamin with minerals  1 tablet Oral Daily  . paliperidone  6 mg Oral Q supper  . temazepam  15 mg Oral QHS  . thiamine  100 mg Oral Daily  . traZODone  50 mg Oral QHS   Continuous Infusions: PRN Meds:.acetaminophen **OR** acetaminophen, hydrALAZINE, ondansetron **OR** ondansetron (ZOFRAN) IV   Antibiotics   Anti-infectives    None        Subjective:   Kristen Colon was seen and examined today. Much more alert and oriented today. Denies any chest pain, shortness of breath, dizziness, abdominal pain, nausea or vomiting or diarrhea. No fevers or chills. No acute issues overnight.  Objective:   Vitals:   09/21/16 0558 09/21/16 1426 09/21/16 2350 09/22/16 0429  BP: 133/88 (!) 148/73 101/88 125/68  Pulse: 77 83 85 82  Resp: 17 20 17 17   Temp: (!) 97.4 F (36.3 C) 98.8 F (37.1 C) 98.6 F (37 C) 98 F (36.7 C)  TempSrc: Axillary Oral Axillary Oral  SpO2: 96% 97% 100% 100%  Weight:      Height:        Intake/Output Summary (Last 24 hours) at 09/22/16 1209 Last data filed at 09/22/16 0839  Gross per 24 hour  Intake              120 ml  Output                0 ml  Net              120 ml     Wt Readings from Last 3 Encounters:  09/20/16 57.4 kg (126 lb 8 oz)  09/06/14 54.9 kg (121 lb)  03/14/14 55.8 kg (123 lb)     Exam   General: Alert and oriented x 2, NAD  Eyes:   HEENT:    Cardiovascular: S1 S2 auscultated, no rubs, murmurs or gallops. Regular rate and rhythm. No pedal edema b/l  Respiratory: Clear to auscultation bilaterally, no wheezing, rales or rhonchi  Gastrointestinal: Soft, nontender, nondistended, + bowel sounds  Ext: no pedal edema bilaterally  Neuro: no new deficits  Musculoskeletal: No digital cyanosis,  clubbing  Skin: No rashes  Psych: slight confusion, alert and oriented x2     Data Reviewed:  I have personally reviewed following labs and imaging studies  Micro Results No results found for this or any previous visit (from the past 240 hour(s)).  Radiology Reports Ct Head Wo Contrast  Result Date: 09/19/2016 CLINICAL DATA:  Increased confusion EXAM: CT HEAD WITHOUT  CONTRAST TECHNIQUE: Contiguous axial images were obtained from the base of the skull through the vertex without intravenous contrast. COMPARISON:  12/26/2014 FINDINGS: Brain: No acute territorial infarction, hemorrhage or mass is seen. Minimal small vessel ischemic changes of the white matter. 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 Stable ventricle size. Mild to moderate atrophy. Vascular: No hyperdense vessels. Scattered calcifications at the carotid siphons. Skull: Normal. Negative for fracture or focal lesion. Sinuses/Orbits: Mild mucosal thickening in the ethmoid sinuses. Old appearing deformity medial wall left orbit. No acute orbital abnormality. Other: None IMPRESSION: 1. No CT evidence for acute intracranial abnormality. Electronically Signed   By: Donavan Foil M.D.   On: 09/19/2016 22:06   Mr Brain Wo Contrast  Result Date: 09/21/2016 CLINICAL DATA:  66 y/o F; 2 days of confusion. History of schizophrenia, diabetes, and hypertension. EXAM: MRI HEAD WITHOUT CONTRAST TECHNIQUE: Multiplanar, multiecho pulse sequences of the brain and surrounding structures were obtained without intravenous contrast. COMPARISON:  09/19/2016 CT head.  01/19/2010 MRI of the head. FINDINGS: Brain: No acute infarction, hemorrhage, hydrocephalus, extra-axial collection or mass lesion. Few nonspecific foci of T2 FLAIR hyperintense signal abnormality in subcortical and periventricular white matter is compatible with mild chronic microvascular ischemic changes for age. Moderate brain parenchymal volume loss. Mild progression from 2011. Punctate  focus of susceptibility hypointensity within left parietal centrum semiovale compatible with hemosiderin deposition of chronic microhemorrhage. Vascular: Normal flow voids. Skull and upper cervical spine: Normal marrow signal. Sinuses/Orbits: Negative. Other: None. IMPRESSION: 1. No acute intracranial abnormality identified. 2. Mild chronic microvascular ischemic changes and moderate parenchymal volume loss of the brain. Mild interval progression from 2011. Electronically Signed   By: Kristine Garbe M.D.   On: 09/21/2016 05:24   Ct Abdomen Pelvis W Contrast  Result Date: 09/19/2016 CLINICAL DATA:  Distended abdomen with nausea and vomiting EXAM: CT ABDOMEN AND PELVIS WITH CONTRAST TECHNIQUE: Multidetector CT imaging of the abdomen and pelvis was performed using the standard protocol following bolus administration of intravenous contrast. CONTRAST:  153mL ISOVUE-300 IOPAMIDOL (ISOVUE-300) INJECTION 61% COMPARISON:  04/24/2014 FINDINGS: Lower chest: Lung bases demonstrate no consolidation or pleural effusion. The heart size is within normal limits. Hepatobiliary: Stable hypodensity measuring 13 mm within the left hepatic lobe, possible hemangioma. Slight increased size of a lobulated cyst along the hepatic hilum measuring 24 mm compared to 21 mm previously. Multiple calcified gallstones. No biliary dilatation. Subcentimeter right posterior hepatic lobe lesion also unchanged and likely a tiny cyst. Pancreas: Unremarkable. No pancreatic ductal dilatation or surrounding inflammatory changes. Spleen: Normal in size without focal abnormality. Adrenals/Urinary Tract: Adrenal glands are unremarkable. Kidneys are normal, without renal calculi, focal lesion, or hydronephrosis. Bladder is unremarkable. Stomach/Bowel: Stomach is within normal limits. Appendix appears normal. No evidence of bowel wall thickening, distention, or inflammatory changes. Vascular/Lymphatic: Aortic atherosclerosis. No significantly  enlarged lymph nodes Reproductive: Interval increase in size of a large right adnexal cystic mass measuring 10.4 x 13.4 cm, previously 10 cm. Mass-effect with displacement of bowel loops toward the periphery. Enlarged left ovary with multiple low-density lesions. Uterus contains calcified fibroids. Other: Trace fluid around the large ovarian mass. Negative for free air. Hazy somewhat nodular density within the anterior omentum but significant respiratory motion artifact Musculoskeletal: No acute or significant osseous findings. IMPRESSION: 1. 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 Enlarged left ovary also containing multiple low-density lesions. Surgical consultation recommended. There is hazy and nodular infiltrated  appearance of the anterior omentum however uncertain if this is largely related to respiratory motion artifact ; metastatic disease could also produce this appearance 2. Slight increase in size of hepatic cysts. Stable probable hemangioma in the left lobe 3. Small amount of free fluid in the pelvis 4. Gallstones Electronically Signed   By: Donavan Foil M.D.   On: 09/19/2016 22:18   Nm Hepato W/eject Fract  Result Date: 09/20/2016 CLINICAL DATA:  confused over the last 2 days. Patient has been having episodes of vomiting with abdominal discomfort and abdominal distention. CT abdomen shows large ovarian mass and gallbladder stones. oriented and follows commands. Pt historyHTN, DM, dementia, alcohol use EXAM: NUCLEAR MEDICINE HEPATOBILIARY IMAGING WITH GALLBLADDER EF TECHNIQUE: Sequential images of the abdomen were obtained out to 60 minutes following intravenous administration of radiopharmaceutical. After oral ingestion of Ensure, gallbladder ejection fraction was determined. At 60 min, normal ejection fraction is greater than 33%. Technologist relates no evident pain or discomfort post ingestion. RADIOPHARMACEUTICALS:   5.4 mCi Tc-41m  Choletec IV COMPARISON:  None. FINDINGS: Prompt uptake and biliary excretion of activity by the liver is seen. Gallbladder activity is visualized, consistent with patency of cystic duct. Biliary activity passes into small bowel, consistent with patent common bile duct. Calculated gallbladder ejection fraction is 35%. (Normal gallbladder ejection fraction with Ensure is greater than 33%.) IMPRESSION: Normal Electronically Signed   By: Lucrezia Europe M.D.   On: 09/20/2016 11:40    Lab Data:  CBC:  Recent Labs Lab 09/19/16 1437 09/20/16 0625  WBC 9.1 8.7  HGB 13.6 13.1  HCT 40.7 39.2  MCV 86.0 85.2  PLT 289 902   Basic Metabolic Panel:  Recent Labs Lab 09/19/16 1437 09/20/16 0625 09/21/16 0527 09/22/16 0333  NA 137 137 137 137  K 4.3 3.8 4.3 3.7  CL 105 102 105 105  CO2 24 32 22 23  GLUCOSE 156* 253* 170* 109*  BUN 9 13 14 12   CREATININE 0.70 0.76 0.69 0.83  CALCIUM 9.6 9.2 9.0 9.0   GFR: Estimated Creatinine Clearance: 60 mL/min (by C-G formula based on SCr of 0.83 mg/dL). Liver Function Tests:  Recent Labs Lab 09/19/16 1437  AST 28  ALT 35  ALKPHOS 87  BILITOT 0.5  PROT 7.4  ALBUMIN 3.8    Recent Labs Lab 09/19/16 1437  LIPASE 26    Recent Labs Lab 09/20/16 0004  AMMONIA 30   Coagulation Profile: No results for input(s): INR, PROTIME in the last 168 hours. Cardiac Enzymes: No results for input(s): CKTOTAL, CKMB, CKMBINDEX, TROPONINI in the last 168 hours. BNP (last 3 results) No results for input(s): PROBNP in the last 8760 hours. HbA1C: No results for input(s): HGBA1C in the last 72 hours. CBG:  Recent Labs Lab 09/21/16 0745 09/21/16 1223 09/21/16 1704 09/21/16 2348 09/22/16 0749  GLUCAP 160* 158* 73 112* 122*   Lipid Profile: No results for input(s): CHOL, HDL, LDLCALC, TRIG, CHOLHDL, LDLDIRECT in the last 72 hours. Thyroid Function Tests: No results for input(s): TSH, T4TOTAL, FREET4, T3FREE, THYROIDAB in the last 72  hours. Anemia Panel:  Recent Labs  09/21/16 1255  VITAMINB12 2,728*   Urine analysis:    Component Value Date/Time   COLORURINE YELLOW 09/19/2016 1508   APPEARANCEUR HAZY (A) 09/19/2016 1508   LABSPEC 1.016 09/19/2016 1508   PHURINE 6.0 09/19/2016 1508   GLUCOSEU 50 (A) 09/19/2016 1508   HGBUR NEGATIVE 09/19/2016 1508   BILIRUBINUR NEGATIVE 09/19/2016 1508   [place] 09/19/2016 1508  PROTEINUR NEGATIVE 09/19/2016 1508   UROBILINOGEN 0.2 10/23/2014 1102   NITRITE NEGATIVE 09/19/2016 1508   LEUKOCYTESUR NEGATIVE 09/19/2016 1508     Ripudeep Rai M.D. [facility] 09/22/2016, 12:09 PM  Pager: [phone] Between 7am to 7pm - call Pager - [phone]  After 7pm go to [email or web address] - password [email or web address]  Call night coverage person covering after 7pm

## 2016-09-22 NOTE — Consult Note (Signed)
Consult Note: Gyn-Onc  Consult was requested by Dr. Tana Coast for the evaluation of Kristen Colon 66 y.o. female  CC:  Chief Complaint  Patient presents with  . Hypertension    Assessment/Plan:  Kristen Colon  is a 66 y.o.  year old with a 13cm mosty simple, fluid filled cyst that appears to be arising from the right ovary. It has been present for more than 2 years and has minimally grown in size over that time (2-3cm). It has not increased in complexity and there are no associated signs of ovarian cancer (such as ascites, carcinomatosis, lymphadenopathy). I believe that this is a benign lesion and is not the cause of her symptoms.  This diagnosis comes in the setting of a hospitalization for abdominal pain, nausea and altered mental status. These symptoms are not related to the cyst which has been present for years, while these symptoms have only been present acutely.  I do not recommend surgery for a benign asymptomatic lesion in the setting of acute exacerbations of dementia, substance abuse and hypertension. Her dementia has not been worked up yet (with respect to etiology). Additionally, given the relapse in alcohol use in the past 2 weeks it is not appropriate to contemplate elective surgery until an extended period of sobriety has been established (3-6 months minimum).  I discussed her case with Dr Tana Coast and her daughter.  We will establish an outpatient follow-up appointment for her in September. We will then develop a plan to monitor her and consider surgery if her other underlying medical issues stabilize.  HPI: Kristen Colon is a 66 year old P17 who is seen in consultation at the request of Dr Tana Coast for  13cm right ovarian cyst.  The patient has schizophrenia, dementia (of unknown etiology), HTN and alcohol abuse. She had been hospitalized in July 2018 for inpatient detoxification. She was then discharged to home on antabuse. Her daughter reports that she discovered that the  caregiver she had hired at home had actually provided her mother with alcohol while she was using antabuse.  She had been recently determined to have dementia, but had not yet had a full workup of the etiology.  She developed symptoms of altered mental status, nausea and emesis on 09/18/16 and her PCP recommended she be sent to the hospital where she was admitted to [facility]. She was noted to be very hypertensive.  CT abdo/pelvis on 09/19/16 showed Interval increase in size of a large right adnexal cystic mass measuring 10.4 x 13.4 cm, previously 10 cm. Mass-effect with displacement of bowel loops toward the periphery. Enlarged left ovary with multiple low-density lesions. Uterus contains calcified fibroids. The cyst was seen on 04/24/14 on a CT abdo/pelvis.  The cyst is regular in appearance with a single septation but is mostly fluid filled with slight thickening on the inferior cortex. It appears benign.  Current Meds:  Current Facility-Administered Medications:  .  acetaminophen (TYLENOL) tablet 650 mg, 650 mg, Oral, Q6H PRN **OR** acetaminophen (TYLENOL) suppository 650 mg, 650 mg, Rectal, Q6H PRN, Rise Patience, MD .  amLODipine (NORVASC) tablet 5 mg, 5 mg, Oral, Daily, Rai, Ripudeep K, MD, 5 mg at 09/22/16 0911 .  benztropine (COGENTIN) tablet 1 mg, 1 mg, Oral, BID, Rise Patience, MD, 1 mg at 09/22/16 0911 .  buPROPion (WELLBUTRIN XL) 24 hr tablet 150 mg, 150 mg, Oral, Daily, Rise Patience, MD, 150 mg at 09/22/16 0911 .  disulfiram (ANTABUSE) tablet 250 mg, 250 mg, Oral,  Joellyn Quails, MD .  enoxaparin (LOVENOX) injection 40 mg, 40 mg, Subcutaneous, Daily, Rise Patience, MD, 40 mg at 09/22/16 0912 .  gabapentin (NEURONTIN) capsule 300 mg, 300 mg, Oral, Daily, Rise Patience, MD, 300 mg at 09/22/16 4235 .  hydrALAZINE (APRESOLINE) injection 10 mg, 10 mg, Intravenous, Q4H PRN, Rise Patience, MD .  insulin aspart (novoLOG) injection 0-9 Units, 0-9  Units, Subcutaneous, TID WC, Rise Patience, MD, 1 Units at 09/22/16 [id number] .  lactulose (CHRONULAC) 10 GM/15ML solution 10 g, 10 g, Oral, Daily, Rise Patience, MD, 10 g at 09/22/16 0912 .  lisinopril (PRINIVIL,ZESTRIL) tablet 20 mg, 20 mg, Oral, Daily, Rise Patience, MD, 20 mg at 09/22/16 0911 .  memantine (NAMENDA) tablet 5 mg, 5 mg, Oral, QHS, Rise Patience, MD, 5 mg at 09/21/16 2149 .  multivitamin with minerals tablet 1 tablet, 1 tablet, Oral, Daily, Rise Patience, MD, 1 tablet at 09/22/16 0912 .  ondansetron (ZOFRAN) tablet 4 mg, 4 mg, Oral, Q6H PRN **OR** ondansetron (ZOFRAN) injection 4 mg, 4 mg, Intravenous, Q6H PRN, Rise Patience, MD .  paliperidone (INVEGA) 24 hr tablet 6 mg, 6 mg, Oral, Q supper, Rise Patience, MD, 6 mg at 09/21/16 1947 .  temazepam (RESTORIL) capsule 15 mg, 15 mg, Oral, QHS, Rise Patience, MD, 15 mg at 09/21/16 2149 .  thiamine (VITAMIN B-1) tablet 100 mg, 100 mg, Oral, Daily, Bridgett Larsson, RPH, 100 mg at 09/22/16 4315 .  traZODone (DESYREL) tablet 50 mg, 50 mg, Oral, QHS, Rise Patience, MD, 50 mg at 09/21/16 2149  Allergy: No Known Allergies  Social Hx:   Social History   Social History  . Marital status: Divorced    Spouse name: N/A  . Number of children: N/A  . Years of education: N/A   Occupational History  . Not on file.   Social History Main Topics  . Smoking status: Current Every Day Smoker    Packs/day: 0.20    Types: Cigarettes  . Smokeless tobacco: Never Used  . Alcohol use Yes  . Drug use: Yes    Types: Marijuana  . Sexual activity: Not on file     Comment: quit 5 years ago   Other Topics Concern  . Not on file   Social History Narrative  . No narrative on file    Past Surgical Hx: History reviewed. No pertinent surgical history.  Past Medical Hx:  Past Medical History:  Diagnosis Date  . Dementia   . Diabetes mellitus   . Hypertension   . Schizophrenia [facility])      Past Gynecological History:  Patient reports 82 SVD's No LMP recorded. Patient is postmenopausal. last menstrual period age 49  Family Hx:  Family History  Problem Relation Age of Onset  . Heart disease Mother   . Diabetes Sister     Review of Systems:  Constitutional  Feels well,    ENT Normal appearing ears and nares bilaterally Skin/Breast  No rash, sores, jaundice, itching, dryness Cardiovascular  No chest pain, shortness of breath, or edema  Pulmonary  No cough or wheeze.  Gastro Intestinal  + nausea and emesis prior to admission, now none of these symptoms  Genito Urinary  No frequency, urgency, dysuria, no bleeding Musculo Skeletal  No myalgia, arthralgia, joint swelling or pain  Neurologic  No weakness, numbness, change in gait,  Psychology  No depression, anxiety, insomnia.   Vitals:  Blood pressure 125/68,  pulse 82, temperature 98 F (36.7 C), temperature source Oral, resp. rate 17, height 5\' 5"  (1.651 m), weight 126 lb 8 oz (57.4 kg), SpO2 100 %.  Physical Exam: WD in NAD Neck  Supple NROM, without any enlargements.  Lymph Node Survey No cervical supraclavicular or inguinal adenopathy Cardiovascular  Pulse normal rate, regularity and rhythm. S1 and S2 normal.  Lungs  Clear to auscultation bilateraly, without wheezes/crackles/rhonchi. Good air movement.  Skin  No rash/lesions/breakdown  Psychiatry  Alert and oriented to person, place, and time  Abdomen  Normoactive bowel sounds, abdomen soft, non-tender and nonobese without evidence of hernia. There is a cystic fullness to the lower abdomen. Back No CVA tenderness Genito Urinary  deferred Rectal  deferred Extremities  No bilateral cyanosis, clubbing or edema.   Donaciano Eva, MD  09/22/2016, 10:08 AM  Cell [phone]

## 2016-09-22 NOTE — Discharge Instructions (Signed)
A follow-up appointment will be made with Dr Serita Grit office at the [facility]. Ross Dr Denman George does not believe that the right ovarian cyst is a cancerous lesion.

## 2016-09-23 DIAGNOSIS — R112 Nausea with vomiting, unspecified: Secondary | ICD-10-CM | POA: Diagnosis not present

## 2016-09-23 DIAGNOSIS — G9340 Encephalopathy, unspecified: Secondary | ICD-10-CM | POA: Diagnosis not present

## 2016-09-23 DIAGNOSIS — R410 Disorientation, unspecified: Secondary | ICD-10-CM | POA: Diagnosis not present

## 2016-09-23 DIAGNOSIS — E119 Type 2 diabetes mellitus without complications: Secondary | ICD-10-CM | POA: Diagnosis not present

## 2016-09-23 DIAGNOSIS — Z23 Encounter for immunization: Secondary | ICD-10-CM | POA: Diagnosis not present

## 2016-09-23 DIAGNOSIS — I10 Essential (primary) hypertension: Secondary | ICD-10-CM | POA: Diagnosis not present

## 2016-09-23 LAB — CA 125: CANCER ANTIGEN (CA) 125: 491.5 U/mL — AB (ref 0.0–38.1)

## 2016-09-23 LAB — BASIC METABOLIC PANEL
Anion gap: 8 (ref 5–15)
BUN: 12 mg/dL (ref 6–20)
CALCIUM: 9.1 mg/dL (ref 8.9–10.3)
CO2: 24 mmol/L (ref 22–32)
CREATININE: 0.77 mg/dL (ref 0.44–1.00)
Chloride: 106 mmol/L (ref 101–111)
Glucose, Bld: 131 mg/dL — ABNORMAL HIGH (ref 65–99)
Potassium: 4.3 mmol/L (ref 3.5–5.1)
SODIUM: 138 mmol/L (ref 135–145)

## 2016-09-23 LAB — GLUCOSE, CAPILLARY
GLUCOSE-CAPILLARY: 121 mg/dL — AB (ref 65–99)
Glucose-Capillary: 129 mg/dL — ABNORMAL HIGH (ref 65–99)
Glucose-Capillary: 86 mg/dL (ref 65–99)

## 2016-09-23 LAB — RPR: RPR Ser Ql: NONREACTIVE

## 2016-09-23 LAB — HIV ANTIBODY (ROUTINE TESTING W REFLEX): HIV SCREEN 4TH GENERATION: NONREACTIVE

## 2016-09-23 LAB — VITAMIN B1: VITAMIN B1 (THIAMINE): 175.8 nmol/L (ref 66.5–200.0)

## 2016-09-23 MED ORDER — TEMAZEPAM 7.5 MG PO CAPS: 7.5000 mg | ORAL_CAPSULE | Freq: Every day | ORAL | Status: DC

## 2016-09-23 MED ORDER — THIAMINE HCL 100 MG PO TABS: 100.0000 mg | ORAL_TABLET | Freq: Every day | ORAL | 3 refills | Status: DC

## 2016-09-23 MED ORDER — TEMAZEPAM 7.5 MG PO CAPS: 7.5000 mg | ORAL_CAPSULE | Freq: Every day | ORAL | 0 refills | Status: DC

## 2016-09-23 MED ORDER — AMLODIPINE BESYLATE 5 MG PO TABS: 5.0000 mg | ORAL_TABLET | Freq: Every day | ORAL | 3 refills | Status: DC

## 2016-09-23 NOTE — Progress Notes (Signed)
CSW received consult regarding PT recommendation of SNF at discharge.  Patient is refusing SNF. CSW and RNCM spoke with patient's daughter. She would like to take patient home with home health services. RNCM following up on whether patient is able to get more PCA hours as well.  CSW signing off.   Kristen Colon LCSWA [phone]

## 2016-09-23 NOTE — Progress Notes (Signed)
Physical Therapy Treatment Patient Details Name: Kristen Colon MRN: [id number] DOB: 1950/04/03 Today's Date: 09/23/2016    History of Present Illness Kristen Colon is a 66 y.o. female with  history of schizophrenia, diabetes mellitus, hypertension was recently discharged from psychiatric facility from [facility] as per the daughter and his living with her was found to be increasingly confused over the last 2 days.    PT Comments    Pt progressing towards goals. Pt's impaired cognition is limiting ability to become indep. Pt with improved ambulation but remains unable to navigate around halls. Pt with poor attention and decreased safety awareness. Pt remains at increased falls risk. Acute PT to con't to follow.    Follow Up Recommendations  SNF;Supervision/Assistance - 24 hour     Equipment Recommendations  None recommended by PT    Recommendations for Other Services       Precautions / Restrictions Precautions Precautions: Fall Precaution Comments: psych history Restrictions Weight Bearing Restrictions: No    Mobility  Bed Mobility Overal bed mobility: Modified Independent             General bed mobility comments: used bed rail, increased time  Transfers Overall transfer level: Needs assistance Equipment used: 1 person hand held assist;2 person hand held assist;None Transfers: Sit to/from Stand Sit to Stand: Min guard         General transfer comment: pt unsteady with lateral sway  Ambulation/Gait Ambulation/Gait assistance: Min guard Ambulation Distance (Feet): 300 Feet Assistive device: None Gait Pattern/deviations: Step-through pattern Gait velocity: slow Gait velocity interpretation: Below normal speed for age/gender General Gait Details: pt with less lateral sway and staggering compared to yesterday but due to cognition remains unable to navigate hallways and is easily distracted resulting in in stability and increased falls  risk   Stairs Stairs: Yes   Stair Management: One rail Left;Alternating pattern Number of Stairs: 12 General stair comments: pt required v/c's to stay on task and for safety  Wheelchair Mobility    Modified Rankin (Stroke Patients Only)       Balance Overall balance assessment: Needs assistance                   Tandem Stance - Right Leg:  (modA to achieve , unable to maitain without assist) Tandem Stance - Left Leg:  (modA to achieve , unable to maitain without assist) [place] - Eyes Opened:  (difficulty staying on task, pt unable to maintain)                  Cognition Arousal/Alertness: Awake/alert Behavior During Therapy: WFL for tasks assessed/performed Overall Cognitive Status: History of cognitive impairments - at baseline                                 General Comments: no family avail to assess baseline. Pt only oriented to name, couldn't state birthday, todays date, [place] or state. pt with poor comprehension and delayed processing. pt with decreased insight to deficits and safety      Exercises      General Comments        Pertinent Vitals/Pain Pain Assessment: No/denies pain    Home Living                      Prior Function            PT Goals (current goals can  now be found in the care plan section) Acute Rehab PT Goals Patient Stated Goal: didn't state Progress towards PT goals: Progressing toward goals    Frequency    Min 3X/week      PT Plan Current plan remains appropriate    Co-evaluation              AM-PAC PT "6 Clicks" Daily Activity  Outcome Measure  Difficulty turning over in bed (including adjusting bedclothes, sheets and blankets)?: A Lot Difficulty moving from lying on back to sitting on the side of the bed? : A Lot Difficulty sitting down on and standing up from a chair with arms (e.g., wheelchair, bedside commode, etc,.)?: A Lot Help needed moving to and from a bed  to chair (including a wheelchair)?: A Lot Help needed walking in hospital room?: A Lot Help needed climbing 3-5 steps with a railing? : A Lot 6 Click Score: 12    End of Session Equipment Utilized During Treatment: Gait belt Activity Tolerance: Patient tolerated treatment well Patient left: in chair;with call bell/phone within reach;with chair alarm set Nurse Communication: Mobility status PT Visit Diagnosis: Unsteadiness on feet (R26.81)     Time: 0488-8916 PT Time Calculation (min) (ACUTE ONLY): 27 min  Charges:  $Gait Training: 8-22 mins $Neuromuscular Re-education: 8-22 mins                    G Codes:       Kittie Plater, PT, DPT Pager #: [phone] Office #: [phone]    [place] 09/23/2016, 12:52 PM

## 2016-09-23 NOTE — Care Management Note (Addendum)
Case Management Note  Patient Details  Name: Kristen Colon MRN: [id number] Date of Birth: 07/23/1950  Subjective/Objective:        Admitted with Acute encephalopathy/Delirium, gait instability in the setting of schizophrenia, dementia.    Kristen Colon (Daughter)        [phone]                                    PCP: Everardo Beals  Action/Plan: Plan is to d/c to home today. Daughter to provide transportation ton home.  Expected Discharge Date:  09/23/16               Expected Discharge Plan:  [place]  In-House Referral:     Discharge planning Services  CM Consult  Post Acute Care Choice:    Choice offered to:  Adult Children Jenny Reichmann)  DME Arranged:    DME Agency:     HH Arranged:  RN, PT, OT, Social Work [employer] Agency:  [employer], referral made pending MD's order.   Status of Service:  COMPLETED  If discussed at Long Length of Stay Meetings, dates discussed:    Additional Comments: 09/24/2016 Pt's daughter [name] requested  MD to complete DMA 3051 form, requesting an extension of more Personal Care Service hrs for mom which is  provided through [place]. Form completed and CM faxed to [phone].  Daughter made aware application completed and faxed.   Whitman Hero Early, RN 09/23/2016, 1:24 PM

## 2016-09-23 NOTE — Progress Notes (Signed)
Patient discharge teaching given, including activity, diet, follow-up appoints, and medications. Patient verbalized understanding of all discharge instructions and copy left for daughter to review. IV access was d/c'd. Vitals are stable. Skin is intact except as charted in most recent assessments. Pt to be escorted out by NT, to be driven home by family.

## 2016-09-23 NOTE — Consult Note (Signed)
[facility] Consult   Reason for Consult:  History of schizophrenia, confusion Referring Physician:  Dr. Tana Coast Patient Identification: Kristen Colon MRN:  [id number] Principal Diagnosis: Acute encephalopathy Diagnosis:   Patient Active Problem List   Diagnosis Date Noted  . Ovarian mass [N83.9] 09/20/2016  . Non-intractable vomiting with nausea [R11.2] 09/20/2016  . Hypertension, uncontrolled [I10] 09/20/2016  . Diabetes mellitus type 2 in nonobese ([place]) [E11.9] 09/20/2016  . Acute encephalopathy [G93.40] 09/19/2016  . Schizoaffective disorder, chronic condition with acute exacerbation ([place]) [F25.8]   . Schizophrenia, undifferentiated ([place]) [F20.3] 06/21/2014  . Delusional disorder ([place]) [F22]   . Psychoses [F29]   . Gallbladder polyp [K82.4] 07/21/2011  . Liver mass [R16.0] 07/21/2011    Total Time spent with patient: 30 minutes  Subjective:   Kristen Colon is a 66 y.o. female patient admitted with worsening confusion, abdominal pain and distension.  HPI:  Patient is a 66 year old female. Patient is limited historian. Most information obtained from chart. As per chart notes, she has history of chronic mental illness ( Schizophrenia) and had been recently admitted to a psychiatric facility .she was discharged about a week prior to this admission. Over the last 2 days patient was noted to be increasingly confused , with elevated BP, vomiting, and abdominal pain. Report is that patient had been discharged on Wellbutrin and Antabuse.  Admission UDS positive for BZDs, admission BAL < 5. However, patient states " I did have a beer before I came here ". At this time patient is alert, attentive, mildly distractible, not agitated,  pleasant on approach, confused, knows she is in a hospital, but cannot specify further, disoriented to city or county, disoriented to time.  Patient minimizes depression, denies any suicidal ideations, and denies hallucinations/does not appear  internally preoccupied. Thought process is disorganized .   Past Psychiatric History: As per chart notes, patient had a recent psychiatric admission to [facility] in [facility]  ( 8/12-8/29/18) with a diagnosis of Schizophrenia and of Dementia.  At the time she had been missing for several days, not taking medications, and presented psychotic  when found . She was discharged with referral to ACT team and Home Care , and on Namenda, Invega Sustenna 156 mgrs IM Q month ( last dose 8/19), Invega 6 mgrs QDAY,Neurontin 300 mgrs QHS, Restoril 30 mgrs QHS , Gogentin 1 mgr BID, Lyrica 100 mgrs TID Home meds also include Antabuse 250 mgrs QDAY .    Risk to Self: Is patient at risk for suicide?: No Risk to Others:   Prior Inpatient Therapy:   Prior Outpatient Therapy:    Past Medical History:  Past Medical History:  Diagnosis Date  . Dementia   . Diabetes mellitus   . Hypertension   . Schizophrenia ([place])    History reviewed. No pertinent surgical history. Family History:  Family History  Problem Relation Age of Onset  . Heart disease Mother   . Diabetes Sister    Family Psychiatric  History: unknown Social History:  History  Alcohol Use  . Yes     History  Drug Use  . Types: Marijuana    Social History   Social History  . Marital status: Divorced    Spouse name: N/A  . Number of children: N/A  . Years of education: N/A   Social History Main Topics  . Smoking status: Current Every Day Smoker    Packs/day: 0.20    Types: Cigarettes  . Smokeless tobacco: Never  Used  . Alcohol use Yes  . Drug use: Yes    Types: Marijuana  . Sexual activity: Not Asked     Comment: quit 5 years ago   Other Topics Concern  . None   Social History Narrative  . None   Additional Social History:    Allergies:  No Known Allergies  Labs:  Results for orders placed or performed during the hospital encounter of 09/19/16 (from the past 48 hour(s))  Glucose, capillary      Status: Abnormal   Collection Time: 09/21/16 12:23 PM  Result Value Ref Range   Glucose-Capillary 158 (H) 65 - 99 mg/dL  Vitamin B12     Status: Abnormal   Collection Time: 09/21/16 12:55 PM  Result Value Ref Range   Vitamin B-12 2,728 (H) 180 - 914 pg/mL    Comment: (NOTE) This assay is not validated for testing neonatal or myeloproliferative syndrome specimens for Vitamin B12 levels.   Glucose, capillary     Status: None   Collection Time: 09/21/16  5:04 PM  Result Value Ref Range   Glucose-Capillary 73 65 - 99 mg/dL  Glucose, capillary     Status: Abnormal   Collection Time: 09/21/16 11:48 PM  Result Value Ref Range   Glucose-Capillary 112 (H) 65 - 99 mg/dL  Basic metabolic panel     Status: Abnormal   Collection Time: 09/22/16  3:33 AM  Result Value Ref Range   Sodium 137 135 - 145 mmol/L   Potassium 3.7 3.5 - 5.1 mmol/L   Chloride 105 101 - 111 mmol/L   CO2 23 22 - 32 mmol/L   Glucose, Bld 109 (H) 65 - 99 mg/dL   BUN 12 6 - 20 mg/dL   Creatinine, Ser 0.83 0.44 - 1.00 mg/dL   Calcium 9.0 8.9 - 10.3 mg/dL   GFR calc non Af Amer >60 >60 mL/min   GFR calc Af Amer >60 >60 mL/min    Comment: (NOTE) The eGFR has been calculated using the CKD EPI equation. This calculation has not been validated in all clinical situations. eGFR's persistently <60 mL/min signify possible Chronic Kidney Disease.    Anion gap 9 5 - 15  Glucose, capillary     Status: Abnormal   Collection Time: 09/22/16  7:49 AM  Result Value Ref Range   Glucose-Capillary 122 (H) 65 - 99 mg/dL  Glucose, capillary     Status: Abnormal   Collection Time: 09/22/16 12:10 PM  Result Value Ref Range   Glucose-Capillary 160 (H) 65 - 99 mg/dL  TSH     Status: None   Collection Time: 09/22/16 12:54 PM  Result Value Ref Range   TSH 1.551 0.350 - 4.500 uIU/mL    Comment: Performed by a 3rd Generation assay with a functional sensitivity of <=0.01 uIU/mL.  RPR     Status: None   Collection Time: 09/22/16 12:54 PM   Result Value Ref Range   RPR Ser Ql Non Reactive Non Reactive    Comment: (NOTE) Performed At: [facility] [address] [place], [place] [id number] Lindon Romp MD [id number]   HIV antibody (routine testing) (NOT for [facility])     Status: None   Collection Time: 09/22/16 12:54 PM  Result Value Ref Range   HIV Screen 4th Generation wRfx Non Reactive Non Reactive    Comment: (NOTE) Performed At: [facility] [place], [place] [id number] Lindon Romp MD [id number]   Glucose, capillary  Status: Abnormal   Collection Time: 09/22/16  5:10 PM  Result Value Ref Range   Glucose-Capillary 112 (H) 65 - 99 mg/dL  Glucose, capillary     Status: Abnormal   Collection Time: 09/22/16  9:30 PM  Result Value Ref Range   Glucose-Capillary 125 (H) 65 - 99 mg/dL  Basic metabolic panel     Status: Abnormal   Collection Time: 09/23/16  5:38 AM  Result Value Ref Range   Sodium 138 135 - 145 mmol/L   Potassium 4.3 3.5 - 5.1 mmol/L    Comment: SLIGHT HEMOLYSIS   Chloride 106 101 - 111 mmol/L   CO2 24 22 - 32 mmol/L   Glucose, Bld 131 (H) 65 - 99 mg/dL   BUN 12 6 - 20 mg/dL   Creatinine, Ser 0.77 0.44 - 1.00 mg/dL   Calcium 9.1 8.9 - 10.3 mg/dL   GFR calc non Af Amer >60 >60 mL/min   GFR calc Af Amer >60 >60 mL/min    Comment: (NOTE) The eGFR has been calculated using the CKD EPI equation. This calculation has not been validated in all clinical situations. eGFR's persistently <60 mL/min signify possible Chronic Kidney Disease.    Anion gap 8 5 - 15  Glucose, capillary     Status: Abnormal   Collection Time: 09/23/16  8:00 AM  Result Value Ref Range   Glucose-Capillary 129 (H) 65 - 99 mg/dL  Glucose, capillary     Status: Abnormal   Collection Time: 09/23/16 12:08 PM  Result Value Ref Range   Glucose-Capillary 121 (H) 65 - 99 mg/dL    Current Facility-Administered Medications  Medication Dose Route Frequency Provider Last Rate Last Dose  .  acetaminophen (TYLENOL) tablet 650 mg  650 mg Oral Q6H PRN Rise Patience, MD       Or  . acetaminophen (TYLENOL) suppository 650 mg  650 mg Rectal Q6H PRN Rise Patience, MD      . amLODipine (NORVASC) tablet 5 mg  5 mg Oral Daily Rai, Ripudeep K, MD   5 mg at 09/23/16 0817  . buPROPion (WELLBUTRIN XL) 24 hr tablet 150 mg  150 mg Oral Daily Rise Patience, MD   150 mg at 09/23/16 0817  . enoxaparin (LOVENOX) injection 40 mg  40 mg Subcutaneous Daily Rise Patience, MD   40 mg at 09/23/16 0817  . gabapentin (NEURONTIN) capsule 300 mg  300 mg Oral Daily Rise Patience, MD   300 mg at 09/23/16 0816  . hydrALAZINE (APRESOLINE) injection 10 mg  10 mg Intravenous Q4H PRN Rise Patience, MD      . insulin aspart (novoLOG) injection 0-9 Units  0-9 Units Subcutaneous TID WC Rise Patience, MD   1 Units at 09/23/16 [phone]  . lactulose (CHRONULAC) 10 GM/15ML solution 10 g  10 g Oral Daily Rise Patience, MD   10 g at 09/23/16 0816  . lisinopril (PRINIVIL,ZESTRIL) tablet 20 mg  20 mg Oral Daily Rise Patience, MD   20 mg at 09/23/16 0816  . memantine (NAMENDA) tablet 5 mg  5 mg Oral QHS Rise Patience, MD   5 mg at 09/22/16 2118  . multivitamin with minerals tablet 1 tablet  1 tablet Oral Daily Rise Patience, MD   1 tablet at 09/23/16 [phone]  . ondansetron (ZOFRAN) tablet 4 mg  4 mg Oral Q6H PRN Rise Patience, MD       Or  . ondansetron [facility])  injection 4 mg  4 mg Intravenous Q6H PRN Rise Patience, MD      . paliperidone (INVEGA) 24 hr tablet 6 mg  6 mg Oral Q supper Rise Patience, MD   6 mg at 09/22/16 1758  . temazepam (RESTORIL) capsule 7.5 mg  7.5 mg Oral QHS Rai, Ripudeep K, MD      . thiamine (VITAMIN B-1) tablet 100 mg  100 mg Oral Daily Bridgett Larsson, RPH   100 mg at 09/23/16 0817  . traZODone (DESYREL) tablet 50 mg  50 mg Oral QHS Rise Patience, MD   50 mg at 09/22/16 2118    Musculoskeletal: Strength  & Muscle Tone: within normal limits Gait & Station: gait not examined  Patient leans: N/A  Psychiatric Specialty Exam: Physical Exam  ROS denies chest pain, denies dyspnea, states abdominal pain has subsided, no vomiting   Blood pressure 122/63, pulse 82, temperature 98.3 F (36.8 C), resp. rate 17, height _0  (1.651 m), weight 57.4 kg (126 lb 8 oz), SpO2 100 %.Body mass index is 21.05 kg/m.  General Appearance: Fairly Groomed  Eye Contact:  Fair  Speech:  Normal Rate  Volume:  Normal  Mood:  denies feeling depressed, smiles at times during session  Affect:  reactive   Thought Process:  Disorganized and Descriptions of Associations: Tangential  Orientation:  Other:  oriented to person, disoriented to place and time   Thought Content:  denies hallucinations, and currently does not appear internally preoccupied   Suicidal Thoughts:  No denies suicidal or self injurious ideations, denies homicidal or violent ideations  Homicidal Thoughts:  No  Memory:  recent and remote limited   Judgement:  Impaired  Insight:  Lacking  Psychomotor Activity:  Normal does not appear to be in any acute distress or discomfort  Concentration:  Concentration: Fair and Attention Span: Fair  Recall:  [employer] of Knowledge:  Fair  Language:  Fair  Akathisia:  Negative  Handed:  Right  AIMS (if indicated):     Assets:  Desire for Improvement Resilience  ADL's:  Fair- improving   Cognition:  Impaired,  Moderate  Sleep:      Assessment - patient has history of Schizophrenia and of Demential as per chart, with history of recent psychiatric admission for worsening confusion, disorientation, psychosis.  At this time patient presents calm, pleasant, confused. No gross psychotic symptoms noted , and she denies hallucinations, but thought process is impoverished and tangential.She is not suicidal, not homicidal. Recent episode of increased BP, abdominal pain, vomiting could have been related to Disulfiram  reaction, as patient on Antabuse and reporting she had recently consumed beer.  Currently vitals are stable  Treatment Plan Summary: as below  Disposition: No evidence of imminent risk to self or others at present.   Patient does not meet criteria for psychiatric inpatient admission. Would consider simplifying medication regimen to minimize potential side effects , particularly cognitive side effects. -Would taper Restoril down/off to minimize potential for sedation, falls, and potential increased risk of confusion on BZDS -Would consider stopping Cogentin to avoid potential anticholinergic side effects. Would monitor for possible EPS symptoms  -Would consider stopping Antabuse, as patient's cognitive status may make it difficult for her to understand the importance of full alcohol abstinence .  Continue Namenda, Wellbutrin XL, Invega .  Would note that abdominal pain is listed as side effect for both Namenda and Wellbutrin XL- if this symptoms persists, may consider changing medications.  Due to patient's history of worsening cognitive functioning , would determine with family wether referral to NH may be appropriate disposition planning .   Psychiatry consultant will follow with you.  Ambrose Finland, MD 09/23/2016 12:22 PM

## 2016-09-23 NOTE — Discharge Summary (Addendum)
Physician Discharge Summary   Patient ID: Kristen Colon MRN: [id number] DOB/AGE: July 20, 1950 66 y.o.  Admit date: 09/19/2016 Discharge date: 09/23/2016  Primary Care Physician:  Everardo Beals, NP  Discharge Diagnoses:    . Acute encephalopathy . Schizophrenia, undifferentiated ([place]) . Hypertension, uncontrolled   Abdominal pain, nausea Kristen vomiting   Large right ovarian cystic mass   Diabetes mellitus type 2   History of alcohol abuse  Consults: Gyn-oncology Psychiatry  Recommendations for Outpatient Follow-up:  1. Home health, Colon, OT, RN, HHA, social work arranged by case management. Patient declined skilled nursing facility 2. Please repeat CBC/BMET at next visit  DIET: Carb modified diet    Allergies:  No Known Allergies   DISCHARGE MEDICATIONS: Current Discharge Medication List    START taking these medications   Details  amLODipine (NORVASC) 5 MG tablet Take 1 tablet (5 mg total) by mouth daily. Qty: 30 tablet, Refills: 3    thiamine 100 MG tablet Take 1 tablet (100 mg total) by mouth daily. Qty: 30 tablet, Refills: 3      CONTINUE these medications which have CHANGED   Details  temazepam (RESTORIL) 7.5 MG capsule Take 1 capsule (7.5 mg total) by mouth at bedtime. Qty: 30 capsule, Refills: 0      CONTINUE these medications which have NOT CHANGED   Details  amantadine (SYMMETREL) 100 MG capsule Take 100 mg by mouth 2 (two) times daily.    buPROPion (WELLBUTRIN XL) 150 MG 24 hr tablet Take 150 mg by mouth daily.    disulfiram (ANTABUSE) 250 MG tablet Take 250 mg by mouth every other day.    gabapentin (NEURONTIN) 300 MG capsule Take 300 mg by mouth daily.     INVEGA 6 MG 24 hr tablet Take 6 mg by mouth daily with supper. Refills: 1    INVEGA SUSTENNA 156 MG/ML SUSP injection Inject 1 mL (156 mg total) into the muscle every 30 (thirty) days. Qty: 0.9 mL, Refills: 0    lactulose (CHRONULAC) 10 GM/15ML solution Take 10 g by mouth  daily. Refills: 3    lisinopril (PRINIVIL,ZESTRIL) 10 MG tablet Take 20 mg by mouth daily.     memantine (NAMENDA) 5 MG tablet Take 5 mg by mouth at bedtime.  Refills: 1    metFORMIN (GLUCOPHAGE) 500 MG tablet Take 500 mg by mouth daily with breakfast.     Multiple Vitamin (MULITIVITAMIN WITH MINERALS) TABS Take 1 tablet by mouth daily.    oxyCODONE (OXY IR/ROXICODONE) 5 MG immediate release tablet Take 1-2 tablets by mouth every 8 (eight) hours as needed. pain Refills: 0    traZODone (DESYREL) 50 MG tablet Take 50 mg by mouth at bedtime. Refills: 1      STOP taking these medications     benztropine (COGENTIN) 1 MG tablet      mirtazapine (REMERON) 30 MG tablet          Brief H Kristen P: For complete details please refer to admission H Kristen P, but in brief Per admit note by Dr. Hal Hope on 9/1 Kristen Geddes Thorneis a 66 y.o.femalewith history of schizophrenia, diabetes mellitus, hypertension was recently discharged from psychiatric facility from [facility] as per the daughter, Colon was found to be increasingly confused over the last 2 days. Patient was discharged week ago. Patient blood pressure also was found to be elevated. Patient had gone to her PCP last week Kristen patient's lisinopril dose was increased. In addition patient has been having episodes of vomiting with  abdominal discomfort Kristen abdominal distention.In the ER patient was noted to have episodes of confusion. As per the daughter patient's only new medications after admissions recently was Wellbutrin Kristen Antabuse. Patient has not taken any alcohol for last 3 weeks. CT head is unremarkable. CT abdomen shows large ovarian mass Kristen gallbladder stones. On my exam patient appears to be oriented Kristen follows commands.  Hospital Course:   Acute encephalopathy/Delirium, gait instability in the setting of schizophrenia, dementia - confused at the time of admission, possibly due to medications, psychiatric issue, hypertensive  urgency. Ammonia level 30, no UTI - MRI of the brain negative for acute CVA however showed mild chronic microvascular ischemic changes Kristen moderate parenchymal volume loss of the brain, mild interval progression from 2011 -B12 2728, B1 pending - psychiatry was consulted for medication adjustment, recently started on wellbutrin Kristen antabuse, now admitted with more confusion. Psychiatry recommended stopping Cogentin to avoid potential anticholinergic side effects, monitor for possible EPS symptoms. Also recommended considering to stop Antabuse, recommended to keep patient on Namenda, Wellbutrin XL Kristen Invega - likely vascular dementia due to uncontrolled hypertension Kristen MRI findings. TSH 1.5, RPR negative, HIV negative.  - PTOT evaluation recommended skilled nursing facility however patient Kristen daughter declined. - Continue thiamine - After discussing with patient's daughter, she wished to keep her mother on antabuse Kristen follow with her outpatient psychiatrist. Will continue antabuse for now.     Abdominal pain, nausea Kristen vomiting - CT abdomen Kristen pelvis showed interval increase in the size of large right ovarian cystic mass now measuring 13 cm concerning for ovarian neoplasm, gallstones - HIDA scan normal - appreciate Dr. Denman George, gyn onc, for evaluating the patient. Per recommendations, appears to be benign lesion. Per Dr. Denman George no surgery needed for benign asymptomatic lesions in the setting of acute exacerbations of dementia, substance abuse Kristen hypertension. - outpatient follow-up scheduled on 10/1    Hypertension, uncontrolled, systolic in 466Z at the time of admission - BP improved today, continue Norvasc, lisinopril    Diabetes mellitus type 2 in nonobese (HCC) - continue sliding scale insulin - hold metformin  History of alcohol abuse - Recently started on Antabuse, quit alcohol 2 weeks ago. Psychiatry recommended discontinuing Antabuse  - May have underlying B1 deficiency or  wernicke's encephalopathy, placed on thiamine   Day of  Discharge BP 117/70 (BP Location: Left Arm)   Pulse 86   Temp 97.6 F (36.4 C) (Oral)   Resp 17   Ht 5\' 5"  (1.651 m)   Wt 57.4 kg (126 lb 8 oz)   SpO2 96%   BMI 21.05 kg/m   Physical Exam: General: Alert Kristen awake oriented x3 not in any acute distress. HEENT: anicteric sclera, pupils reactive to light Kristen accommodation CVS: S1-S2 clear no murmur rubs or gallops Chest: clear to auscultation bilaterally, no wheezing rales or rhonchi Abdomen: soft nontender, nondistended, normal bowel sounds Extremities: no cyanosis, clubbing or edema noted bilaterally Neuro: Cranial nerves II-XII intact, no focal neurological deficits   The results of significant diagnostics from this hospitalization (including imaging, microbiology, ancillary Kristen laboratory) are listed below for reference.    LAB RESULTS: Basic Metabolic Panel:  Recent Labs Lab 09/22/16 0333 09/23/16 0538  NA 137 138  K 3.7 4.3  CL 105 106  CO2 23 24  GLUCOSE 109* 131*  BUN 12 12  CREATININE 0.83 0.77  CALCIUM 9.0 9.1   Liver Function Tests:  Recent Labs Lab 09/19/16 1437  AST 28  ALT 35  ALKPHOS 87  BILITOT 0.5  PROT 7.4  ALBUMIN 3.8    Recent Labs Lab 09/19/16 1437  LIPASE 26    Recent Labs Lab 09/20/16 0004  AMMONIA 30   CBC:  Recent Labs Lab 09/19/16 1437 09/20/16 0625  WBC 9.1 8.7  HGB 13.6 13.1  HCT 40.7 39.2  MCV 86.0 85.2  PLT 289 293   Cardiac Enzymes: No results for input(s): CKTOTAL, CKMB, CKMBINDEX, TROPONINI in the last 168 hours. BNP: Invalid input(s): POCBNP CBG:  Recent Labs Lab 09/23/16 1208 09/23/16 1712  GLUCAP 121* 86    Significant Diagnostic Studies:  Ct Head Wo Contrast  Result Date: 09/19/2016 CLINICAL DATA:  Increased confusion EXAM: CT HEAD WITHOUT CONTRAST TECHNIQUE: Contiguous axial images were obtained from the base of the skull through the vertex without intravenous contrast. COMPARISON:   12/26/2014 FINDINGS: Brain: No acute territorial infarction, hemorrhage or mass is seen. Minimal small vessel ischemic changes of the white matter. 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 Stable ventricle size. Mild to moderate atrophy. Vascular: No hyperdense vessels. Scattered calcifications at the carotid siphons. Skull: Normal. Negative for fracture or focal lesion. Sinuses/Orbits: Mild mucosal thickening in the ethmoid sinuses. Old appearing deformity medial wall left orbit. No acute orbital abnormality. Other: None IMPRESSION: 1. No CT evidence for acute intracranial abnormality. Electronically Signed   By: Donavan Foil M.D.   On: 09/19/2016 22:06   Ct Abdomen Pelvis W Contrast  Result Date: 09/19/2016 CLINICAL DATA:  Distended abdomen with nausea Kristen vomiting EXAM: CT ABDOMEN Kristen PELVIS WITH CONTRAST TECHNIQUE: Multidetector CT imaging of the abdomen Kristen pelvis was performed using the standard protocol following bolus administration of intravenous contrast. CONTRAST:  180mL ISOVUE-300 IOPAMIDOL (ISOVUE-300) INJECTION 61% COMPARISON:  04/24/2014 FINDINGS: Lower chest: Lung bases demonstrate no consolidation or pleural effusion. The heart size is within normal limits. Hepatobiliary: Stable hypodensity measuring 13 mm within the left hepatic lobe, possible hemangioma. Slight increased size of a lobulated cyst along the hepatic hilum measuring 24 mm compared to 21 mm previously. Multiple calcified gallstones. No biliary dilatation. Subcentimeter right posterior hepatic lobe lesion also unchanged Kristen likely a tiny cyst. Pancreas: Unremarkable. No pancreatic ductal dilatation or surrounding inflammatory changes. Spleen: Normal in size without focal abnormality. Adrenals/Urinary Tract: Adrenal glands are unremarkable. Kidneys are normal, without renal calculi, focal lesion, or hydronephrosis. Bladder is unremarkable. Stomach/Bowel: Stomach is within normal limits. Appendix appears normal. No evidence of  bowel wall thickening, distention, or inflammatory changes. Vascular/Lymphatic: Aortic atherosclerosis. No significantly enlarged lymph nodes Reproductive: Interval increase in size of a large right adnexal cystic mass measuring 10.4 x 13.4 cm, previously 10 cm. Mass-effect with displacement of bowel loops toward the periphery. Enlarged left ovary with multiple low-density lesions. Uterus contains calcified fibroids. Other: Trace fluid around the large ovarian mass. Negative for free air. Hazy somewhat nodular density within the anterior omentum but significant respiratory motion artifact Musculoskeletal: No acute or significant osseous findings. IMPRESSION: 1. Interval increase in size of a large right ovarian cystic mass, now measuring 13 cm, Kristen concerning for ovarian neoplasm. Enlarged left ovary also containing multiple low-density lesions. Surgical consultation recommended. There is hazy Kristen nodular infiltrated appearance of the anterior omentum however uncertain if this is largely related to respiratory motion artifact ; metastatic disease could also produce this appearance 2. Slight increase in size of hepatic cysts. Stable probable hemangioma in the left lobe 3. Small amount of free fluid in the pelvis 4. Gallstones Electronically Signed   By:  Donavan Foil M.D.   On: 09/19/2016 22:18   Nm Hepato W/eject Fract  Result Date: 09/20/2016 CLINICAL DATA:  confused over the last 2 days. Patient has been having episodes of vomiting with abdominal discomfort Kristen abdominal distention. CT abdomen shows large ovarian mass Kristen gallbladder stones. oriented Kristen follows commands. Colon historyHTN, DM, dementia, alcohol use EXAM: NUCLEAR MEDICINE HEPATOBILIARY IMAGING WITH GALLBLADDER EF TECHNIQUE: Sequential images of the abdomen were obtained out to 60 minutes following intravenous administration of radiopharmaceutical. After oral ingestion of Ensure, gallbladder ejection fraction was determined. At 60 min, normal  ejection fraction is greater than 33%. Technologist relates no evident pain or discomfort post ingestion. RADIOPHARMACEUTICALS:  5.4 mCi Tc-71m  Choletec IV COMPARISON:  None. FINDINGS: Prompt uptake Kristen biliary excretion of activity by the liver is seen. Gallbladder activity is visualized, consistent with patency of cystic duct. Biliary activity passes into small bowel, consistent with patent common bile duct. Calculated gallbladder ejection fraction is 35%. (Normal gallbladder ejection fraction with Ensure is greater than 33%.) IMPRESSION: Normal Electronically Signed   By: Lucrezia Europe M.D.   On: 09/20/2016 11:40    2D ECHO:   Disposition Kristen Follow-up: Discharge Instructions    Diet - low sodium heart healthy    Complete by:  As directed    Increase activity slowly    Complete by:  As directed        DISPOSITION: Home health Colon OT, RN, home health aide, social work   DISCHARGE FOLLOW-UP Follow-up Information    Everardo Beals, NP. Schedule an appointment as soon as possible for a visit in 2 week(s).   Contact information: [facility] [place] [address] [phone]        Everitt Amber, MD Follow up on 10/20/2016.   Specialty:  Obstetrics Kristen Gynecology Why:  at 1pm. Please arrive 15 minutes early for paperwork. Contact information: [place] [address] [phone]        [facility] Follow up.   Why:  home health services arranged , office will call to set up home visits Contact information: [address] [place] [address] [phone]            Time spent on Discharge: 40 minutes  Signed:   Estill Cotta M.D. [facility] 09/23/2016, 5:30 PM Pager: [phone]

## 2016-10-16 NOTE — Progress Notes (Signed)
PT Evaluation Addendum Late entry for missed G-code on 09/22/16 Based on review of documentation and goals    09/22/16 1400  PT Time Calculation  PT Start Time (ACUTE ONLY) 1202  PT Stop Time (ACUTE ONLY) 1231  PT Time Calculation (min) (ACUTE ONLY) 29 min  PT G-Codes **NOT FOR INPATIENT CLASS**  Functional Assessment Tool Used AM-PAC 6 Clicks Basic Mobility  Functional Limitation Mobility: Walking and moving around  Mobility: Walking and Moving Around Current Status (G9030) CL  Mobility: Walking and Moving Around Goal Status (B4996) CI  PT General Charges  $$ ACUTE PT VISIT 1 Visit  PT Evaluation  $PT Eval Moderate Complexity 1 Mod  PT Treatments  $Gait Training 8-22 mins   10/16/2016 [place], PT [phone]

## 2016-10-20 ENCOUNTER — Encounter: Payer: Self-pay | Admitting: Gynecologic Oncology

## 2016-10-20 ENCOUNTER — Ambulatory Visit: Payer: Medicare Other | Attending: Gynecologic Oncology | Admitting: Gynecologic Oncology

## 2016-10-20 ENCOUNTER — Telehealth: Payer: Self-pay | Admitting: Gynecologic Oncology

## 2016-10-20 VITALS — BP 131/76 | HR 79 | Temp 97.5°F | Resp 18 | Ht 65.0 in | Wt 126.4 lb

## 2016-10-20 DIAGNOSIS — Z794 Long term (current) use of insulin: Secondary | ICD-10-CM | POA: Diagnosis not present

## 2016-10-20 DIAGNOSIS — R1909 Other intra-abdominal and pelvic swelling, mass and lump: Secondary | ICD-10-CM

## 2016-10-20 DIAGNOSIS — Z79899 Other long term (current) drug therapy: Secondary | ICD-10-CM | POA: Insufficient documentation

## 2016-10-20 DIAGNOSIS — F17210 Nicotine dependence, cigarettes, uncomplicated: Secondary | ICD-10-CM | POA: Insufficient documentation

## 2016-10-20 DIAGNOSIS — F1020 Alcohol dependence, uncomplicated: Secondary | ICD-10-CM | POA: Diagnosis not present

## 2016-10-20 DIAGNOSIS — R1900 Intra-abdominal and pelvic swelling, mass and lump, unspecified site: Secondary | ICD-10-CM

## 2016-10-20 DIAGNOSIS — N949 Unspecified condition associated with female genital organs and menstrual cycle: Secondary | ICD-10-CM

## 2016-10-20 DIAGNOSIS — I10 Essential (primary) hypertension: Secondary | ICD-10-CM | POA: Diagnosis not present

## 2016-10-20 DIAGNOSIS — F209 Schizophrenia, unspecified: Secondary | ICD-10-CM | POA: Insufficient documentation

## 2016-10-20 DIAGNOSIS — E119 Type 2 diabetes mellitus without complications: Secondary | ICD-10-CM | POA: Diagnosis not present

## 2016-10-20 DIAGNOSIS — F0390 Unspecified dementia without behavioral disturbance: Secondary | ICD-10-CM | POA: Diagnosis not present

## 2016-10-20 DIAGNOSIS — N83201 Unspecified ovarian cyst, right side: Secondary | ICD-10-CM

## 2016-10-20 NOTE — Progress Notes (Signed)
Follow-up Note: Gyn-Onc  Consult was initially requested by Dr. Tana Coast for the evaluation of Kristen Colon 66 y.o. female  CC:     Chief Complaint  Patient presents with  . Hypertension    Assessment/Plan:  Kristen Colon  is a 66 y.o.  year old with a 13cm mosty simple, fluid filled cyst that appears to be arising from the right ovary. It has been present for more than 2 years and has minimally grown in size over that time (2-3cm). It has not increased in complexity and there are no associated signs of ovarian cancer (such as ascites, carcinomatosis, lymphadenopathy). I believe that this is a benign lesion and is not the cause of her symptoms.  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 It is unclear the status of her alcohol use. She admits to continued drinking.  In this setting, I do not feel it is appropriate to intervene surgically for elective conditions (ovarian cyst) as it will almost certainly result in progression of her demential and worsening overall functional status.  We will continue to monitor the cyst. I will repeat Ct imaging in 6 months and see her after that time.  HPI: Kristen Colon is a 66 year old P17 who is seen in consultation at the request of Dr Tana Coast for  13cm right ovarian cyst.  The patient has schizophrenia, dementia (of unknown etiology), HTN and alcohol abuse. She had been hospitalized in July 2018 for inpatient detoxification. She was then discharged to home on antabuse. Her daughter reports that she discovered that the caregiver she had hired at home had actually provided her mother with alcohol while she was using antabuse.  She had been recently determined to have dementia, but had not yet had a full workup of the etiology.  She developed symptoms of altered mental status, nausea and emesis on 09/18/16 and her PCP recommended she be sent to the hospital where she was admitted to [facility]. She was noted to be very  hypertensive.  CT abdo/pelvis on 09/19/16 showed Interval increase in size of a large right adnexal cystic mass measuring 10.4 x 13.4 cm, previously 10 cm. Mass-effect with displacement of bowel loops toward the periphery. Enlarged left ovary with multiple low-density lesions. Uterus contains calcified fibroids. The cyst was seen on 04/24/14 on a CT abdo/pelvis.  The cyst is regular in appearance with a single septation but is mostly fluid filled with slight thickening on the inferior cortex. It appears benign.  Current Meds:  Current Facility-Administered Medications:  .  acetaminophen (TYLENOL) tablet 650 mg, 650 mg, Oral, Q6H PRN **OR** acetaminophen (TYLENOL) suppository 650 mg, 650 mg, Rectal, Q6H PRN, Rise Patience, MD .  amLODipine (NORVASC) tablet 5 mg, 5 mg, Oral, Daily, Rai, Ripudeep K, MD, 5 mg at 09/22/16 0911 .  benztropine (COGENTIN) tablet 1 mg, 1 mg, Oral, BID, Rise Patience, MD, 1 mg at 09/22/16 0911 .  buPROPion (WELLBUTRIN XL) 24 hr tablet 150 mg, 150 mg, Oral, Daily, Rise Patience, MD, 150 mg at 09/22/16 0911 .  disulfiram (ANTABUSE) tablet 250 mg, 250 mg, Oral, QODAY, Rise Patience, MD .  enoxaparin (LOVENOX) injection 40 mg, 40 mg, Subcutaneous, Daily, Rise Patience, MD, 40 mg at 09/22/16 0912 .  gabapentin (NEURONTIN) capsule 300 mg, 300 mg, Oral, Daily, Rise Patience, MD, 300 mg at 09/22/16 0911 .  hydrALAZINE (APRESOLINE) injection 10 mg, 10 mg, Intravenous, Q4H PRN, Rise Patience, MD .  insulin  aspart (novoLOG) injection 0-9 Units, 0-9 Units, Subcutaneous, TID WC, Rise Patience, MD, 1 Units at 09/22/16 [phone]  lactulose (CHRONULAC) 10 GM/15ML solution 10 g, 10 g, Oral, Daily, Rise Patience, MD, 10 g at 09/22/16 0912 .  lisinopril (PRINIVIL,ZESTRIL) tablet 20 mg, 20 mg, Oral, Daily, Rise Patience, MD, 20 mg at 09/22/16 0911 .  memantine (NAMENDA) tablet 5 mg, 5 mg, Oral, QHS, Rise Patience, MD, 5  mg at 09/21/16 2149 .  multivitamin with minerals tablet 1 tablet, 1 tablet, Oral, Daily, Rise Patience, MD, 1 tablet at 09/22/16 0912 .  ondansetron (ZOFRAN) tablet 4 mg, 4 mg, Oral, Q6H PRN **OR** ondansetron (ZOFRAN) injection 4 mg, 4 mg, Intravenous, Q6H PRN, Rise Patience, MD .  paliperidone (INVEGA) 24 hr tablet 6 mg, 6 mg, Oral, Q supper, Rise Patience, MD, 6 mg at 09/21/16 1947 .  temazepam (RESTORIL) capsule 15 mg, 15 mg, Oral, QHS, Rise Patience, MD, 15 mg at 09/21/16 2149 .  thiamine (VITAMIN B-1) tablet 100 mg, 100 mg, Oral, Daily, Bridgett Larsson, RPH, 100 mg at 09/22/16 4431 .  traZODone (DESYREL) tablet 50 mg, 50 mg, Oral, QHS, Rise Patience, MD, 50 mg at 09/21/16 2149  Allergy: No Known Allergies  Social Hx:   Social History        Social History  . Marital status: Divorced    Spouse name: N/A  . Number of children: N/A  . Years of education: N/A      Occupational History  . Not on file.         Social History Main Topics  . Smoking status: Current Every Day Smoker    Packs/day: 0.20    Types: Cigarettes  . Smokeless tobacco: Never Used  . Alcohol use Yes  . Drug use: Yes    Types: Marijuana  . Sexual activity: Not on file     Comment: quit 5 years ago       Other Topics Concern  . Not on file      Social History Narrative  . No narrative on file    Past Surgical Hx: History reviewed. No pertinent surgical history.  Past Medical Hx:      Past Medical History:  Diagnosis Date  . Dementia   . Diabetes mellitus   . Hypertension   . Schizophrenia [facility])     Past Gynecological History:  Patient reports 48 SVD's No LMP recorded. Patient is postmenopausal. last menstrual period age 66  Family Hx:       Family History  Problem Relation Age of Onset  . Heart disease Mother   . Diabetes Sister     Review of Systems:  Constitutional  Feels well,    ENT Normal appearing ears  and nares bilaterally Skin/Breast  No rash, sores, jaundice, itching, dryness Cardiovascular  No chest pain, shortness of breath, or edema  Pulmonary  No cough or wheeze.  Gastro Intestinal  + nausea and emesis prior to admission, now none of these symptoms  Genito Urinary  No frequency, urgency, dysuria, no bleeding Musculo Skeletal  No myalgia, arthralgia, joint swelling or pain  Neurologic  No weakness, numbness, change in gait,  Psychology  No depression, anxiety, insomnia.   Vitals:  Blood pressure 125/68, pulse 82, temperature 98 F (36.7 C), temperature source Oral, resp. rate 17, height 5\' 5"  (1.651 m), weight 126 lb 8 oz (57.4 kg), SpO2 100 %.  Physical Exam: WD in NAD  Neck  Supple NROM, without any enlargements.  Lymph Node Survey No cervical supraclavicular or inguinal adenopathy Cardiovascular  Pulse normal rate, regularity and rhythm. S1 and S2 normal.  Lungs  Clear to auscultation bilateraly, without wheezes/crackles/rhonchi. Good air movement.  Skin  No rash/lesions/breakdown  Psychiatry  Alert and oriented to person, place, and time  Abdomen  Normoactive bowel sounds, abdomen soft, non-tender and nonobese without evidence of hernia. There is a cystic fullness to the lower abdomen. Back No CVA tenderness Genito Urinary  Normal external genitalia. Normal cervix and uterus. Soft, mobile cystic mass fills pelvis into lower abdomen, non tender. Rectal  deferred Extremities  No bilateral cyanosis, clubbing or edema.   Donaciano Eva, MD

## 2016-10-20 NOTE — Telephone Encounter (Signed)
Left message for patient's daughter.  

## 2016-10-20 NOTE — Patient Instructions (Signed)
Plan on having a CT scan of the abdomen and pelvis (no oral contrast) in six months with an appointment to see Dr. Denman George after that.  Please call our office once you have had your scan and we will schedule your follow up appt.

## 2016-10-21 ENCOUNTER — Telehealth: Payer: Self-pay

## 2016-10-21 NOTE — Telephone Encounter (Signed)
Reviewed plan of care and rational as noted in Dr. Serita Grit 10-20-16 office not with Adv, Home Care Nurse as daughter not able to be at visit yesterday. Kristen Colon will review information with daughter.

## 2017-03-13 ENCOUNTER — Telehealth: Payer: Self-pay | Admitting: *Deleted

## 2017-03-13 NOTE — Telephone Encounter (Signed)
Called and spoke with the daughter regarding the need for a lab appt. Patient's daughter to call the office back today to schedule appt.

## 2017-03-18 ENCOUNTER — Telehealth: Payer: Self-pay | Admitting: *Deleted

## 2017-03-18 NOTE — Telephone Encounter (Signed)
Attempted to call the patient's daughter with no answer. Patient needs to have a lab appt scheduled

## 2017-03-20 ENCOUNTER — Other Ambulatory Visit: Payer: Self-pay | Admitting: Gynecologic Oncology

## 2017-03-23 ENCOUNTER — Other Ambulatory Visit: Payer: Self-pay | Admitting: Gynecologic Oncology

## 2017-03-23 ENCOUNTER — Ambulatory Visit (HOSPITAL_COMMUNITY): Admission: RE | Admit: 2017-03-23 | Payer: Medicare Other | Source: Ambulatory Visit

## 2017-03-23 DIAGNOSIS — N838 Other noninflammatory disorders of ovary, fallopian tube and broad ligament: Secondary | ICD-10-CM

## 2017-03-23 NOTE — Progress Notes (Signed)
Istat creatinine for CT

## 2017-03-24 ENCOUNTER — Encounter (HOSPITAL_COMMUNITY): Payer: Self-pay

## 2017-03-24 ENCOUNTER — Ambulatory Visit (HOSPITAL_COMMUNITY)
Admission: RE | Admit: 2017-03-24 | Discharge: 2017-03-24 | Disposition: A | Discharge status: Home / Self Care | Payer: Medicare Other | Source: Ambulatory Visit | Attending: Gynecologic Oncology | Admitting: Gynecologic Oncology

## 2017-03-24 DIAGNOSIS — R1909 Other intra-abdominal and pelvic swelling, mass and lump: Secondary | ICD-10-CM

## 2017-03-24 DIAGNOSIS — N949 Unspecified condition associated with female genital organs and menstrual cycle: Secondary | ICD-10-CM

## 2017-03-24 MED ORDER — IOPAMIDOL (ISOVUE-300) INJECTION 61%
INTRAVENOUS | Status: AC
  Filled 2017-03-24: qty 100

## 2017-03-24 MED ORDER — SODIUM CHLORIDE 0.9 % IJ SOLN
INTRAMUSCULAR | Status: AC
  Filled 2017-03-24: qty 50

## 2017-03-31 ENCOUNTER — Telehealth: Payer: Self-pay | Admitting: *Deleted

## 2017-03-31 NOTE — Telephone Encounter (Signed)
Couple attempts made to receive both the patient and the daughter, unable to reach either. First attempt om March 7th with no answer from either phone number. Second attempt today with the patient's voicemail being full and daughter didn't answer.

## 2017-04-13 ENCOUNTER — Telehealth: Payer: Self-pay | Admitting: *Deleted

## 2017-04-13 ENCOUNTER — Emergency Department (HOSPITAL_COMMUNITY): Payer: Medicare Other

## 2017-04-13 ENCOUNTER — Encounter (HOSPITAL_COMMUNITY): Payer: Self-pay | Admitting: Emergency Medicine

## 2017-04-13 ENCOUNTER — Inpatient Hospital Stay (HOSPITAL_COMMUNITY)
Admission: EM | Admit: 2017-04-13 | Discharge: 2017-04-17 | DRG: 885 | Disposition: A | Discharge status: Home / Self Care | Payer: Medicare Other | Attending: Internal Medicine | Admitting: Internal Medicine

## 2017-04-13 DIAGNOSIS — R059 Cough, unspecified: Secondary | ICD-10-CM

## 2017-04-13 DIAGNOSIS — C561 Malignant neoplasm of right ovary: Secondary | ICD-10-CM | POA: Diagnosis present

## 2017-04-13 DIAGNOSIS — C569 Malignant neoplasm of unspecified ovary: Secondary | ICD-10-CM | POA: Diagnosis present

## 2017-04-13 DIAGNOSIS — Z8249 Family history of ischemic heart disease and other diseases of the circulatory system: Secondary | ICD-10-CM

## 2017-04-13 DIAGNOSIS — F17210 Nicotine dependence, cigarettes, uncomplicated: Secondary | ICD-10-CM | POA: Diagnosis present

## 2017-04-13 DIAGNOSIS — C786 Secondary malignant neoplasm of retroperitoneum and peritoneum: Secondary | ICD-10-CM | POA: Diagnosis present

## 2017-04-13 DIAGNOSIS — Z833 Family history of diabetes mellitus: Secondary | ICD-10-CM

## 2017-04-13 DIAGNOSIS — K668 Other specified disorders of peritoneum: Secondary | ICD-10-CM

## 2017-04-13 DIAGNOSIS — I745 Embolism and thrombosis of iliac artery: Secondary | ICD-10-CM | POA: Diagnosis present

## 2017-04-13 DIAGNOSIS — R160 Hepatomegaly, not elsewhere classified: Secondary | ICD-10-CM | POA: Diagnosis present

## 2017-04-13 DIAGNOSIS — F0390 Unspecified dementia without behavioral disturbance: Secondary | ICD-10-CM | POA: Diagnosis present

## 2017-04-13 DIAGNOSIS — I10 Essential (primary) hypertension: Secondary | ICD-10-CM | POA: Diagnosis present

## 2017-04-13 DIAGNOSIS — C562 Malignant neoplasm of left ovary: Secondary | ICD-10-CM | POA: Diagnosis present

## 2017-04-13 DIAGNOSIS — N838 Other noninflammatory disorders of ovary, fallopian tube and broad ligament: Secondary | ICD-10-CM | POA: Diagnosis present

## 2017-04-13 DIAGNOSIS — F22 Delusional disorders: Secondary | ICD-10-CM

## 2017-04-13 DIAGNOSIS — Z885 Allergy status to narcotic agent status: Secondary | ICD-10-CM

## 2017-04-13 DIAGNOSIS — G9340 Encephalopathy, unspecified: Secondary | ICD-10-CM | POA: Diagnosis present

## 2017-04-13 DIAGNOSIS — F203 Undifferentiated schizophrenia: Principal | ICD-10-CM | POA: Diagnosis present

## 2017-04-13 DIAGNOSIS — G9341 Metabolic encephalopathy: Secondary | ICD-10-CM | POA: Diagnosis present

## 2017-04-13 DIAGNOSIS — E119 Type 2 diabetes mellitus without complications: Secondary | ICD-10-CM

## 2017-04-13 DIAGNOSIS — R1900 Intra-abdominal and pelvic swelling, mass and lump, unspecified site: Secondary | ICD-10-CM

## 2017-04-13 DIAGNOSIS — R4689 Other symptoms and signs involving appearance and behavior: Secondary | ICD-10-CM

## 2017-04-13 DIAGNOSIS — R05 Cough: Secondary | ICD-10-CM

## 2017-04-13 DIAGNOSIS — Z7984 Long term (current) use of oral hypoglycemic drugs: Secondary | ICD-10-CM

## 2017-04-13 DIAGNOSIS — F259 Schizoaffective disorder, unspecified: Secondary | ICD-10-CM | POA: Diagnosis present

## 2017-04-13 LAB — COMPREHENSIVE METABOLIC PANEL
ALBUMIN: 4.2 g/dL (ref 3.5–5.0)
ALT: 22 U/L (ref 14–54)
ANION GAP: 8 (ref 5–15)
AST: 25 U/L (ref 15–41)
Alkaline Phosphatase: 85 U/L (ref 38–126)
BUN: 12 mg/dL (ref 6–20)
CALCIUM: 9.6 mg/dL (ref 8.9–10.3)
CHLORIDE: 103 mmol/L (ref 101–111)
CO2: 27 mmol/L (ref 22–32)
Creatinine, Ser: 0.79 mg/dL (ref 0.44–1.00)
GFR calc non Af Amer: >60 mL/min (ref >60)
GLUCOSE: 171 mg/dL — AB (ref 65–99)
POTASSIUM: 4.1 mmol/L (ref 3.5–5.1)
SODIUM: 138 mmol/L (ref 135–145)
Total Bilirubin: 0.3 mg/dL (ref 0.3–1.2)
Total Protein: 7.6 g/dL (ref 6.5–8.1)

## 2017-04-13 LAB — CBC WITH DIFFERENTIAL/PLATELET
BASOS ABS: 0 10*3/uL (ref 0.0–0.1)
BASOS PCT: 0 %
EOS PCT: 1 %
Eosinophils Absolute: 0 10*3/uL (ref 0.0–0.7)
HEMATOCRIT: 40.2 % (ref 36.0–46.0)
Hemoglobin: 13.5 g/dL (ref 12.0–15.0)
Lymphocytes Relative: 39 %
Lymphs Abs: 2.5 10*3/uL (ref 0.7–4.0)
MCH: 29.3 pg (ref 26.0–34.0)
MCHC: 33.6 g/dL (ref 30.0–36.0)
MCV: 87.4 fL (ref 78.0–100.0)
MONO ABS: 0.4 10*3/uL (ref 0.1–1.0)
Monocytes Relative: 6 %
NEUTROS ABS: 3.5 10*3/uL (ref 1.7–7.7)
Neutrophils Relative %: 54 %
Platelets: 309 10*3/uL (ref 150–400)
RBC: 4.6 MIL/uL (ref 3.87–5.11)
RDW: 16.6 % — AB (ref 11.5–15.5)
WBC: 6.4 10*3/uL (ref 4.0–10.5)

## 2017-04-13 LAB — RAPID URINE DRUG SCREEN, HOSP PERFORMED
Amphetamines: NOT DETECTED
BARBITURATES: NOT DETECTED
BENZODIAZEPINES: POSITIVE — AB
COCAINE: NOT DETECTED
OPIATES: NOT DETECTED
TETRAHYDROCANNABINOL: NOT DETECTED

## 2017-04-13 LAB — ETHANOL: Alcohol, Ethyl (B): <10 mg/dL (ref <10)

## 2017-04-13 MED ORDER — METFORMIN HCL 500 MG PO TABS
500.0000 mg | ORAL_TABLET | Freq: Every day | ORAL | Status: DC
  Administered 2017-04-14: 500 mg via ORAL
  Filled 2017-04-13: qty 1

## 2017-04-13 MED ORDER — TEMAZEPAM 7.5 MG PO CAPS
7.5000 mg | ORAL_CAPSULE | Freq: Every day | ORAL | Status: DC
  Administered 2017-04-13 – 2017-04-16 (×3): 7.5 mg via ORAL
  Filled 2017-04-13 (×4): qty 1

## 2017-04-13 MED ORDER — ACETAMINOPHEN 325 MG PO TABS: 650.0000 mg | ORAL_TABLET | ORAL | Status: DC | PRN

## 2017-04-13 MED ORDER — AMLODIPINE BESYLATE 5 MG PO TABS
5.0000 mg | ORAL_TABLET | Freq: Every day | ORAL | Status: DC
Start: 2017-04-14 — End: 2017-04-18
  Administered 2017-04-14 – 2017-04-17 (×4): 5 mg via ORAL
  Filled 2017-04-13 (×4): qty 1

## 2017-04-13 MED ORDER — PALIPERIDONE ER 6 MG PO TB24
6.0000 mg | ORAL_TABLET | Freq: Every day | ORAL | Status: DC
  Filled 2017-04-13: qty 1

## 2017-04-13 MED ORDER — ONDANSETRON HCL 4 MG PO TABS: 4.0000 mg | ORAL_TABLET | Freq: Three times a day (TID) | ORAL | Status: DC | PRN

## 2017-04-13 MED ORDER — ALUM & MAG HYDROXIDE-SIMETH 200-200-20 MG/5ML PO SUSP: 30.0000 mL | Freq: Four times a day (QID) | ORAL | Status: DC | PRN

## 2017-04-13 MED ORDER — LACTULOSE 10 GM/15ML PO SOLN
10.0000 g | Freq: Every day | ORAL | Status: DC
  Administered 2017-04-14 – 2017-04-17 (×3): 10 g via ORAL
  Filled 2017-04-13 (×5): qty 15

## 2017-04-13 MED ORDER — IOPAMIDOL (ISOVUE-300) INJECTION 61%
INTRAVENOUS | Status: AC
  Administered 2017-04-13: 100 mL
  Filled 2017-04-13: qty 100

## 2017-04-13 MED ORDER — ADULT MULTIVITAMIN W/MINERALS CH
1.0000 | ORAL_TABLET | Freq: Every day | ORAL | Status: DC
Start: 2017-04-14 — End: 2017-04-18
  Administered 2017-04-14 – 2017-04-17 (×4): 1 via ORAL
  Filled 2017-04-13 (×4): qty 1

## 2017-04-13 MED ORDER — MEMANTINE HCL 10 MG PO TABS
5.0000 mg | ORAL_TABLET | Freq: Two times a day (BID) | ORAL | Status: DC
  Administered 2017-04-13 – 2017-04-17 (×8): 5 mg via ORAL
  Filled 2017-04-13 (×8): qty 1

## 2017-04-13 MED ORDER — BUPROPION HCL ER (XL) 150 MG PO TB24
150.0000 mg | ORAL_TABLET | Freq: Every day | ORAL | Status: DC
  Administered 2017-04-14 – 2017-04-17 (×4): 150 mg via ORAL
  Filled 2017-04-13 (×4): qty 1

## 2017-04-13 MED ORDER — TRAZODONE HCL 50 MG PO TABS
50.0000 mg | ORAL_TABLET | Freq: Every day | ORAL | Status: DC
  Administered 2017-04-13 – 2017-04-16 (×4): 50 mg via ORAL
  Filled 2017-04-13 (×4): qty 1

## 2017-04-13 MED ORDER — LISINOPRIL 20 MG PO TABS
20.0000 mg | ORAL_TABLET | Freq: Every day | ORAL | Status: DC
  Administered 2017-04-14 – 2017-04-17 (×4): 20 mg via ORAL
  Filled 2017-04-13 (×4): qty 1

## 2017-04-13 MED ORDER — THIAMINE HCL 100 MG PO TABS: 100.0000 mg | ORAL_TABLET | Freq: Every day | ORAL | Status: DC

## 2017-04-13 NOTE — ED Notes (Signed)
Bed: WA26 Expected date:  Expected time:  Means of arrival:  Comments: 

## 2017-04-13 NOTE — ED Notes (Signed)
Bed: WA30 Expected date:  Expected time:  Means of arrival:  Comments: 

## 2017-04-13 NOTE — ED Triage Notes (Addendum)
Per IVC paperwork, history of schizophrenia and dementia-states patient is not taking her meds, patient is not eating or sleeping and performing her ADLs-states she is requesting drugs and alcohol-delusional-states she has babies coming out of her back and there are babies running around her house-states she has been caring around a knife in her purse-daughter wants her meds adjusted
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
## 2017-04-13 NOTE — ED Notes (Signed)
Patient transported to CT 

## 2017-04-13 NOTE — ED Notes (Signed)
SITTING IN CHAIR. AWARE OF CT SCAN

## 2017-04-13 NOTE — ED Notes (Signed)
Kandice Hams telephone number is [phone].

## 2017-04-13 NOTE — ED Notes (Signed)
Upon my arrival at the start of shift, patient has been watching t.v. And has been very pleasant. Pt. Has been offered bathroom several times and also patient can toilet independently and safely. Pt very cooperative .
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
## 2017-04-13 NOTE — ED Notes (Signed)
Pt. Is is bed resting and sleeping.

## 2017-04-14 ENCOUNTER — Other Ambulatory Visit: Payer: Self-pay

## 2017-04-14 ENCOUNTER — Emergency Department (HOSPITAL_COMMUNITY): Payer: Medicare Other

## 2017-04-14 DIAGNOSIS — R4587 Impulsiveness: Secondary | ICD-10-CM

## 2017-04-14 DIAGNOSIS — G9340 Encephalopathy, unspecified: Secondary | ICD-10-CM | POA: Diagnosis not present

## 2017-04-14 DIAGNOSIS — R05 Cough: Secondary | ICD-10-CM | POA: Diagnosis present

## 2017-04-14 DIAGNOSIS — F0390 Unspecified dementia without behavioral disturbance: Secondary | ICD-10-CM | POA: Diagnosis present

## 2017-04-14 DIAGNOSIS — C569 Malignant neoplasm of unspecified ovary: Secondary | ICD-10-CM | POA: Diagnosis present

## 2017-04-14 DIAGNOSIS — R4689 Other symptoms and signs involving appearance and behavior: Secondary | ICD-10-CM | POA: Diagnosis not present

## 2017-04-14 DIAGNOSIS — I745 Embolism and thrombosis of iliac artery: Secondary | ICD-10-CM | POA: Diagnosis present

## 2017-04-14 DIAGNOSIS — G4700 Insomnia, unspecified: Secondary | ICD-10-CM

## 2017-04-14 DIAGNOSIS — G9341 Metabolic encephalopathy: Secondary | ICD-10-CM | POA: Diagnosis present

## 2017-04-14 DIAGNOSIS — C786 Secondary malignant neoplasm of retroperitoneum and peritoneum: Secondary | ICD-10-CM | POA: Diagnosis present

## 2017-04-14 DIAGNOSIS — Z8249 Family history of ischemic heart disease and other diseases of the circulatory system: Secondary | ICD-10-CM | POA: Diagnosis not present

## 2017-04-14 DIAGNOSIS — Z7984 Long term (current) use of oral hypoglycemic drugs: Secondary | ICD-10-CM | POA: Diagnosis not present

## 2017-04-14 DIAGNOSIS — Z885 Allergy status to narcotic agent status: Secondary | ICD-10-CM | POA: Diagnosis not present

## 2017-04-14 DIAGNOSIS — R160 Hepatomegaly, not elsewhere classified: Secondary | ICD-10-CM | POA: Diagnosis present

## 2017-04-14 DIAGNOSIS — I10 Essential (primary) hypertension: Secondary | ICD-10-CM | POA: Diagnosis present

## 2017-04-14 DIAGNOSIS — F1290 Cannabis use, unspecified, uncomplicated: Secondary | ICD-10-CM

## 2017-04-14 DIAGNOSIS — Z833 Family history of diabetes mellitus: Secondary | ICD-10-CM | POA: Diagnosis not present

## 2017-04-14 DIAGNOSIS — F17210 Nicotine dependence, cigarettes, uncomplicated: Secondary | ICD-10-CM | POA: Diagnosis present

## 2017-04-14 DIAGNOSIS — E119 Type 2 diabetes mellitus without complications: Secondary | ICD-10-CM | POA: Diagnosis present

## 2017-04-14 DIAGNOSIS — N839 Noninflammatory disorder of ovary, fallopian tube and broad ligament, unspecified: Secondary | ICD-10-CM | POA: Diagnosis not present

## 2017-04-14 DIAGNOSIS — C561 Malignant neoplasm of right ovary: Secondary | ICD-10-CM | POA: Diagnosis present

## 2017-04-14 DIAGNOSIS — C562 Malignant neoplasm of left ovary: Secondary | ICD-10-CM | POA: Diagnosis present

## 2017-04-14 DIAGNOSIS — F203 Undifferentiated schizophrenia: Secondary | ICD-10-CM | POA: Diagnosis present

## 2017-04-14 LAB — URINALYSIS, ROUTINE W REFLEX MICROSCOPIC
Bacteria, UA: NONE SEEN
Bilirubin Urine: NEGATIVE
Glucose, UA: >=500 mg/dL — AB
Hgb urine dipstick: NEGATIVE
Ketones, ur: NEGATIVE mg/dL
Leukocytes, UA: NEGATIVE
Nitrite: NEGATIVE
PROTEIN: NEGATIVE mg/dL
Specific Gravity, Urine: 1.015 (ref 1.005–1.030)
pH: 7 (ref 5.0–8.0)

## 2017-04-14 LAB — CA 125: Cancer Antigen (CA) 125: 513.8 U/mL — ABNORMAL HIGH (ref 0.0–38.1)

## 2017-04-14 MED ORDER — INSULIN ASPART 100 UNIT/ML ~~LOC~~ SOLN: 0.0000 [IU] | Freq: Three times a day (TID) | SUBCUTANEOUS | Status: DC

## 2017-04-14 MED ORDER — POLYETHYLENE GLYCOL 3350 17 G PO PACK
17.0000 g | PACK | Freq: Every day | ORAL | Status: DC
  Filled 2017-04-14: qty 1

## 2017-04-14 MED ORDER — PALIPERIDONE ER 6 MG PO TB24
9.0000 mg | ORAL_TABLET | Freq: Every day | ORAL | Status: DC
  Administered 2017-04-14 – 2017-04-17 (×3): 9 mg via ORAL
  Filled 2017-04-14 (×5): qty 1

## 2017-04-14 MED ORDER — POLYETHYLENE GLYCOL 3350 17 G PO PACK
17.0000 g | PACK | Freq: Every day | ORAL | Status: DC | PRN
  Filled 2017-04-14: qty 1

## 2017-04-14 NOTE — ED Notes (Signed)
Kristen Colon [phone] Patients daughter that she lives with Requesting MRI of her abdomen or ovaries Requesting miralax daily for constipation Message given to psych providers
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
## 2017-04-14 NOTE — ED Notes (Signed)
Provider at bedside.  Dr. Mariea Clonts

## 2017-04-14 NOTE — H&P (Addendum)
[facility] History and Physical  Kristen Colon GHW:[id number] DOB: [birth year] DOA: 04/13/2017  Referring physician:  PCP: Everardo Beals, NP   Chief Complaint: schizophrenia/agitation  HPI:   67 year old female with a history of schizophrenia, hypertension, his affective disorder, delusional disorder, recent diagnosis of  Ovarian  Cancer, brought to the ED by her daughter because the patient was not eating well, not sleeping, unable to perform activities of daily living. She is delusional and seeing things which do not make any sense. Making false accusations towards her daughter.patient brought in by her daughter to the ED to be committed involuntarily, also because she is not accepting treatment for her ovarian cancer because of her underlying psych issues. Daughter states that the patient was recently  Seen by Dr. Denman George, and workup for an ovarian cyst which has been present for 2 years and has minimally grown in size over time. Dr. Denman George believes that this is a benign cyst.Dr. Denman George had scheduled the patient for a CT abdomen pelvis that showed a large 14.5 centimeter right ovary  With definitive omental metastasis scattered throughout the abdomen and progression since August 2018. Dr. Alvy Bimler was called by EDP, who recommended IVC. She will be contacting Dr. Denman George her gynecologic/oncologist , and she will evaluate the patient in the morning. Patient has also been evaluated by psychiatry. Patient is being referred for admission for unstable mental condition and in acceptance of treatment for  Ovarian cancer  Patient is completely confused and unable to provide me with any medical history of pertinent review of systems.she thinks that this is  19 century. She is otherwise hemodynamically stable     Review of Systems: negative for the following   unable to obtain due to confusion   Past Medical History:  Diagnosis Date  . Dementia   . Diabetes mellitus   . Hypertension   .  Schizophrenia ([place])      History reviewed. No pertinent surgical history.    Social History:  reports that she has been smoking cigarettes.  She has been smoking about 0.20 packs per day. She has never used smokeless tobacco. She reports that she drinks alcohol. She reports that she has current or past drug history. Drug: Marijuana.    Allergies  Allergen Reactions  . Hydrocodone Itching    Family History  Problem Relation Age of Onset  . Heart disease Mother   . Diabetes Sister         Prior to Admission medications   Medication Sig Start Date End Date Taking? Authorizing Provider  amantadine (SYMMETREL) 100 MG capsule Take 100 mg by mouth 2 (two) times daily.   Yes [provider]  amLODipine (NORVASC) 5 MG tablet Take 1 tablet (5 mg total) by mouth daily. 09/24/16  Yes Rai, Ripudeep K, MD  Ascorbic Acid (VITAMIN C PO) Take by mouth.   Yes [provider]  buPROPion (WELLBUTRIN XL) 150 MG 24 hr tablet Take 150 mg by mouth daily.   Yes [provider]  Cyanocobalamin (VITAMIN B 12 PO) Take by mouth.   Yes [provider]  INVEGA 6 MG 24 hr tablet Take 6 mg by mouth daily with supper. 08/12/16  Yes [provider]  INVEGA SUSTENNA 156 MG/ML SUSP injection Inject 1 mL (156 mg total) into the muscle every 30 (thirty) days. 09/05/14  Yes Dorie Rank, MD  lactulose [facility]) 10 GM/15ML solution Take 10 g by mouth daily. 08/12/16  Yes [provider]  linaclotide Rolan Lipa) 290  MCG CAPS capsule Take 290 mg by mouth daily.   Yes [provider]  lisinopril (PRINIVIL,ZESTRIL) 10 MG tablet Take 20 mg by mouth daily.    Yes [provider]  memantine (NAMENDA) 10 MG tablet Take 5 mg by mouth 2 (two) times daily.  08/12/16  Yes [provider]  metFORMIN (GLUCOPHAGE) 500 MG tablet Take 500 mg by mouth daily with breakfast.    Yes [provider]  Multiple Vitamin (MULITIVITAMIN WITH MINERALS) TABS Take 1  tablet by mouth daily.   Yes [provider]  thiamine 100 MG tablet Take 1 tablet (100 mg total) by mouth daily. 09/24/16  Yes Rai, Ripudeep K, MD  traZODone (DESYREL) 50 MG tablet Take 50 mg by mouth at bedtime. 08/12/16  Yes [provider]  oxyCODONE (OXY IR/ROXICODONE) 5 MG immediate release tablet Take 1-2 tablets by mouth every 8 (eight) hours as needed. pain 12/19/14   [provider]  temazepam (RESTORIL) 7.5 MG capsule Take 1 capsule (7.5 mg total) by mouth at bedtime. 09/23/16   Mendel Corning, MD     Physical Exam: Vitals:   04/13/17 2325 04/13/17 2358 04/14/17 0548 04/14/17 1133  BP: 136/87 118/88 124/72 122/79  Pulse: 85 93 (!) 101 93  Resp: 18 20 18 18   Temp: (!) 96.7 F (35.9 C) 97.6 F (36.4 C) 97.7 F (36.5 C) (!) 97.4 F (36.3 C)  TempSrc: Oral Oral Oral Oral  SpO2: 97% 99% 96% 95%        Vitals:   04/13/17 2325 04/13/17 2358 04/14/17 0548 04/14/17 1133  BP: 136/87 118/88 124/72 122/79  Pulse: 85 93 (!) 101 93  Resp: 18 20 18 18   Temp: (!) 96.7 F (35.9 C) 97.6 F (36.4 C) 97.7 F (36.5 C) (!) 97.4 F (36.3 C)  TempSrc: Oral Oral Oral Oral  SpO2: 97% 99% 96% 95%   Constitutional: confused" operated, agitated Eyes: PERRL, lids and conjunctivae normal ENMT: Mucous membranes are moist. Posterior pharynx clear of any exudate or lesions.Normal dentition.  Neck: normal, supple, no masses, no thyromegaly Respiratory: clear to auscultation bilaterally, no wheezing, no crackles. Normal respiratory effort. No accessory muscle use.  Cardiovascular: Regular rate and rhythm, no murmurs / rubs / gallops. No extremity edema. 2+ pedal pulses. No carotid bruits.  Abdomen: no tenderness, no masses palpated. No hepatosplenomegaly. Bowel sounds positive.  Musculoskeletal: no clubbing / cyanosis. No joint deformity upper and lower extremities. Good ROM, no contractures. Normal muscle tone.  Skin: no rashes, lesions, ulcers. No  induration Neurologic: CN 2-12 grossly intact. Sensation intact, DTR normal. Strength 5/5 in all 4.  Psychiatric:  impaired judgment and insight. Alert  But disoriented. Normal mood.     Labs on Admission: I have personally reviewed following labs and imaging studies  CBC: Recent Labs  Lab 04/13/17 1647  WBC 6.4  NEUTROABS 3.5  HGB 13.5  HCT 40.2  MCV 87.4  PLT 932    Basic Metabolic Panel: Recent Labs  Lab 04/13/17 1647  NA 138  K 4.1  CL 103  CO2 27  GLUCOSE 171*  BUN 12  CREATININE 0.79  CALCIUM 9.6    GFR: CrCl cannot be calculated (Unknown ideal weight.).  Liver Function Tests: Recent Labs  Lab 04/13/17 1647  AST 25  ALT 22  ALKPHOS 85  BILITOT 0.3  PROT 7.6  ALBUMIN 4.2   No results for input(s): LIPASE, AMYLASE in the last 168 hours. No results for input(s): AMMONIA in the  last 168 hours.  Coagulation Profile: No results for input(s): INR, PROTIME in the last 168 hours. No results for input(s): DDIMER in the last 72 hours.  Cardiac Enzymes: No results for input(s): CKTOTAL, CKMB, CKMBINDEX, TROPONINI in the last 168 hours.  BNP (last 3 results) No results for input(s): PROBNP in the last 8760 hours.  HbA1C: No results for input(s): HGBA1C in the last 72 hours. No results found for: HGBA1C   CBG: No results for input(s): GLUCAP in the last 168 hours.  Lipid Profile: No results for input(s): CHOL, HDL, LDLCALC, TRIG, CHOLHDL, LDLDIRECT in the last 72 hours.  Thyroid Function Tests: No results for input(s): TSH, T4TOTAL, FREET4, T3FREE, THYROIDAB in the last 72 hours.  Anemia Panel: No results for input(s): VITAMINB12, FOLATE, FERRITIN, TIBC, IRON, RETICCTPCT in the last 72 hours.  Urine analysis:    Component Value Date/Time   COLORURINE YELLOW 04/14/2017 1503   APPEARANCEUR CLEAR 04/14/2017 1503   LABSPEC 1.015 04/14/2017 1503   PHURINE 7.0 04/14/2017 1503   GLUCOSEU >=500 (A) 04/14/2017 1503   HGBUR NEGATIVE 04/14/2017 1503    BILIRUBINUR NEGATIVE 04/14/2017 1503   KETONESUR NEGATIVE 04/14/2017 1503   PROTEINUR NEGATIVE 04/14/2017 1503   UROBILINOGEN 0.2 10/23/2014 1102   NITRITE NEGATIVE 04/14/2017 1503   LEUKOCYTESUR NEGATIVE 04/14/2017 1503    Sepsis Labs: @LABRCNTIP (procalcitonin:4,lacticidven:4) )No results found for this or any previous visit (from the past 240 hour(s)).       Radiological Exams on Admission: Dg Chest 2 View  Result Date: 04/14/2017 CLINICAL DATA:  Hallucinations. History of diabetes, hypertension, current smoker. EXAM: CHEST - 2 VIEW COMPARISON:  Chest x-ray of April 11, 2011 FINDINGS: The lungs are adequately inflated. The interstitial markings remain coarse. There is a trace of blunting of the costophrenic angles bilaterally. The heart and pulmonary vascularity are normal. The mediastinum is normal in width. The trachea is midline. The bony thorax exhibits no acute abnormality. IMPRESSION: Minimal blunting of the costophrenic angles is new. No alveolar pneumonia nor pulmonary edema. Stable chronic interstitial prominence likely reflects the patient's smoking history. Electronically Signed   By: David  Martinique M.D.   On: 04/14/2017 14:26   Ct Abdomen Pelvis W Contrast  Result Date: 04/13/2017 CLINICAL DATA:  67 year old female with palpable pelvic mass, chronic large cystic ovarian neoplasm. EXAM: CT ABDOMEN AND PELVIS WITH CONTRAST TECHNIQUE: Multidetector CT imaging of the abdomen and pelvis was performed using the standard protocol following bolus administration of intravenous contrast. CONTRAST:  152mL ISOVUE-300 IOPAMIDOL (ISOVUE-300) INJECTION 61% COMPARISON:  CT Abdomen and Pelvis 09/19/2016 and earlier. FINDINGS: Lower chest: No lower lobe lung nodule or mass identified, but trace bilateral layering pleural fluid is new since 2018. No pericardial effusion. Hepatobiliary: Chronic cholelithiasis. No pericholecystic inflammation. No biliary ductal enlargement. Stable liver parenchyma  with anterior left lobe hemangioma and central benign hepatic cyst (the latter on series 2, image 16) unchanged. Pancreas: Negative. Spleen: Negative. Adrenals/Urinary Tract: Normal adrenal glands. Bilateral kidneys are stable. Bilateral renal enhancement and contrast excretion is symmetric and within normal limits. 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 It is unclear how distally the ureter duplication persists. The urinary bladder is moderately distended and unremarkable. Stomach/Bowel: Definitive omental metastatic disease is demonstrated today, including nodular enhancing oval foci measuring 10 x 30 millimeters at the root of the greater omentum anterior to the liver (series 2 image 12 in 15), and additional scattered pathologic omental nodularity throughout the bilateral abdomen (series 2, images 37 and 38). Small volume associated  ascites. No bowel obstruction. Moderate retained stool in redundant large bowel. No dilated small bowel. No free air. Mildly to moderately distended stomach with fluid and food debris. Decompressed duodenum and proximal small bowel. Vascular/Lymphatic: Occluded left common iliac artery (series 2, image 49) with reconstituted enhancement in the left external and internal iliac. Advanced Aortoiliac calcified atherosclerosis. The other major arterial structures in the abdomen and pelvis remain patent. Portal venous system is patent. No discrete lymphadenopathy in the abdomen or pelvis. Reproductive: Mild additional enlargement of the chronic large predominantly cystic pelvic mass thought to arise from the right ovary. Today this is 11.4 x 14.1 x 14.5 centimeters (AP by transverse by CC) versus 10.4 x 13.4 by 13.4 centimeters previously. There is also been enlargement of the heterogeneous and indistinct left ovary which now measures up to 5 centimeters diameter (previously 4.3 centimeters). The uterus appears diminutive. There are small calcified uterine fibroids  suspected. Other: Trace pelvic fluid similar to 2018. Musculoskeletal: No acute or suspicious osseous lesion identified. IMPRESSION: 1. 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 Small volume ascites. Mildly enlarged and heterogeneous left ovary (up to 5 cm now). 2. New trace pleural effusions are indeterminate. No lung base nodule or mass is identified. 3. 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 Advanced Aortic Atherosclerosis (ICD10-I70.0). 4. Retained stool in redundant colon, but no bowel obstruction. Electronically Signed   By: Genevie Ann M.D.   On: 04/13/2017 19:47   Dg Chest 2 View  Result Date: 04/14/2017 CLINICAL DATA:  Hallucinations. History of diabetes, hypertension, current smoker. EXAM: CHEST - 2 VIEW COMPARISON:  Chest x-ray of April 11, 2011 FINDINGS: The lungs are adequately inflated. The interstitial markings remain coarse. There is a trace of blunting of the costophrenic angles bilaterally. The heart and pulmonary vascularity are normal. The mediastinum is normal in width. The trachea is midline. The bony thorax exhibits no acute abnormality. IMPRESSION: Minimal blunting of the costophrenic angles is new. No alveolar pneumonia nor pulmonary edema. Stable chronic interstitial prominence likely reflects the patient's smoking history. Electronically Signed   By: David  Martinique M.D.   On: 04/14/2017 14:26   Ct Abdomen Pelvis W Contrast  Result Date: 04/13/2017 CLINICAL DATA:  67 year old female with palpable pelvic mass, chronic large cystic ovarian neoplasm. EXAM: CT ABDOMEN AND PELVIS WITH CONTRAST TECHNIQUE: Multidetector CT imaging of the abdomen and pelvis was performed using the standard protocol following bolus administration of intravenous contrast. CONTRAST:  129mL ISOVUE-300 IOPAMIDOL (ISOVUE-300)  INJECTION 61% COMPARISON:  CT Abdomen and Pelvis 09/19/2016 and earlier. FINDINGS: Lower chest: No lower lobe lung nodule or mass identified, but trace bilateral layering pleural fluid is new since 2018. No pericardial effusion. Hepatobiliary: Chronic cholelithiasis. No pericholecystic inflammation. No biliary ductal enlargement. Stable liver parenchyma with anterior left lobe hemangioma and central benign hepatic cyst (the latter on series 2, image 16) unchanged. Pancreas: Negative. Spleen: Negative. Adrenals/Urinary Tract: Normal adrenal glands. Bilateral kidneys are stable. Bilateral renal enhancement and contrast excretion is symmetric and within normal limits. 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 It is unclear how distally the ureter duplication persists. The urinary bladder is moderately distended and unremarkable. Stomach/Bowel: Definitive omental metastatic disease is demonstrated today, including nodular enhancing oval foci measuring 10 x 30 millimeters at the root of the greater omentum anterior to the liver (series 2 image 12 in 15),  and additional scattered pathologic omental nodularity throughout the bilateral abdomen (series 2, images 37 and 38). Small volume associated ascites. No bowel obstruction. Moderate retained stool in redundant large bowel. No dilated small bowel. No free air. Mildly to moderately distended stomach with fluid and food debris. Decompressed duodenum and proximal small bowel. Vascular/Lymphatic: Occluded left common iliac artery (series 2, image 49) with reconstituted enhancement in the left external and internal iliac. Advanced Aortoiliac calcified atherosclerosis. The other major arterial structures in the abdomen and pelvis remain patent. Portal venous system is patent. No discrete lymphadenopathy in the abdomen or pelvis. Reproductive: Mild additional enlargement of the chronic large predominantly cystic pelvic mass thought to arise from the right ovary.  Today this is 11.4 x 14.1 x 14.5 centimeters (AP by transverse by CC) versus 10.4 x 13.4 by 13.4 centimeters previously. There is also been enlargement of the heterogeneous and indistinct left ovary which now measures up to 5 centimeters diameter (previously 4.3 centimeters). The uterus appears diminutive. There are small calcified uterine fibroids suspected. Other: Trace pelvic fluid similar to 2018. Musculoskeletal: No acute or suspicious osseous lesion identified. IMPRESSION: 1. 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 Small volume ascites. Mildly enlarged and heterogeneous left ovary (up to 5 cm now). 2. New trace pleural effusions are indeterminate. No lung base nodule or mass is identified. 3. 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 Advanced Aortic Atherosclerosis (ICD10-I70.0). 4. Retained stool in redundant colon, but no bowel obstruction. Electronically Signed   By: Genevie Ann M.D.   On: 04/13/2017 19:47      EKG: Independently reviewed.    Assessment/Plan Principal Problem:   Schizophrenia, undifferentiated (HCC) Active Problems:   Liver mass   Delusional disorder (HCC)   Schizoaffective disorder, chronic condition with acute exacerbation (HCC)   Acute encephalopathy   Ovarian mass   Hypertension, uncontrolled   Diabetes mellitus type 2 in nonobese ([place])     Ovarian cancer (HCC) EDP has notified oncology on-call, and they will be evaluated by Dr. Denman George in the morning  Schizophrenia Patient is currently under IVC by EDP She has been treated with inVega, will continue bupropion Bedside safety sitter  Hypertension blood pressure stable continue lisinopril, Norvasc  Diabetes mellitus type 2 Will check hemoglobin A1c, sliding scale insulin, Accu-Cheks, hold metformin     DVT prophylaxis:   Lovenox     Code Status Orders  (From admission, onward)        Start     Ordered   04/13/17 2147  Full code  Continuous     04/13/17 2147     consults called:   Family Communication: Admission, patients condition and plan of care including tests being ordered have been discussed with the patient  who indicates understanding and agree with the plan and Code Status  Admission status: inpatient    Disposition plan: Further plan will depend as patient's clinical course evolves and further radiologic and laboratory data become available. Likely home when stable   At the time of admission, it appears that the appropriate admission status for this patient is INPATIENT .Thisis judged to be reasonable and necessary in order to provide the required intensity of service to ensure the patient's safetygiven thepresenting symptoms, physical exam findings, and initial radiographic and laboratory data in the context of their chronic comorbidities.   Reyne Dumas MD [facility] Pager [phone]  If 7PM-7AM, please contact night-coverage [email or web address] Password [facility]  04/14/2017, 5:19 PM

## 2017-04-14 NOTE — ED Notes (Signed)
Report given to RN

## 2017-04-14 NOTE — ED Provider Notes (Signed)
7:28 PM Assumed care from Margarita Mail, PA-C, please see their note for full history, physical and decision making until this point. In brief this is a 67 y.o. year old female who presented to the ED tonight with IVC and Aggressive Behavior     Contacted by psychiatry regarding patients worsening metastatic cancer that has not been addressed or worked up. They think her mental illness is getting in the way of her attaining appropriate treatment and requesting medical admission.  I discussed with Dr. Alvy Bimler who agreed that this likely needed to be worked up in hospital given lack of patient following up.  I discussed with Dr. Allyson Sabal who will admit. Requested that I ensure that patient IVC'ed. Paperwork filled out and signed appropriately in patient folder.   Labs, studies and imaging reviewed by myself and considered in medical decision making if ordered. Imaging interpreted by radiology.  Labs Reviewed  COMPREHENSIVE METABOLIC PANEL - Abnormal; Notable for the following components:      Result Value   Glucose, Bld 171 (*)    All other components within normal limits  RAPID URINE DRUG SCREEN, HOSP PERFORMED - Abnormal; Notable for the following components:   Benzodiazepines POSITIVE (*)    All other components within normal limits  CBC WITH DIFFERENTIAL/PLATELET - Abnormal; Notable for the following components:   RDW 16.6 (*)    All other components within normal limits  CA 125 - Abnormal; Notable for the following components:   Cancer Antigen (CA) 125 513.8 (*)    All other components within normal limits  URINALYSIS, ROUTINE W REFLEX MICROSCOPIC - Abnormal; Notable for the following components:   Glucose, UA >=500 (*)    Squamous Epithelial / LPF 0-5 (*)    All other components within normal limits  ETHANOL  CBC  COMPREHENSIVE METABOLIC PANEL  HEMOGLOBIN A1C    DG Chest 2 View  Final Result    CT ABDOMEN PELVIS W CONTRAST  Final Result      No follow-ups on file.     Merrily Pew, MD 04/14/17 [phone]

## 2017-04-14 NOTE — ED Notes (Signed)
REcieved report from [place],  Pt pending medical admission.  Awake. Alert & responsive, no distress noted, calm & cooperative at present.

## 2017-04-14 NOTE — ED Notes (Signed)
Up to bedside chair

## 2017-04-14 NOTE — ED Notes (Signed)
Patient refuses blood draw saying," You all got blood yesterday."  "I'm not going to give you anymore."

## 2017-04-15 ENCOUNTER — Encounter: Payer: Self-pay | Admitting: Gynecologic Oncology

## 2017-04-15 DIAGNOSIS — G9340 Encephalopathy, unspecified: Secondary | ICD-10-CM | POA: Diagnosis not present

## 2017-04-15 DIAGNOSIS — I10 Essential (primary) hypertension: Secondary | ICD-10-CM | POA: Diagnosis not present

## 2017-04-15 DIAGNOSIS — E119 Type 2 diabetes mellitus without complications: Secondary | ICD-10-CM

## 2017-04-15 DIAGNOSIS — F203 Undifferentiated schizophrenia: Secondary | ICD-10-CM | POA: Diagnosis not present

## 2017-04-15 DIAGNOSIS — F22 Delusional disorders: Secondary | ICD-10-CM | POA: Diagnosis not present

## 2017-04-15 MED ORDER — BENZTROPINE MESYLATE 0.5 MG PO TABS
0.5000 mg | ORAL_TABLET | Freq: Two times a day (BID) | ORAL | Status: DC
  Administered 2017-04-15 – 2017-04-17 (×5): 0.5 mg via ORAL
  Filled 2017-04-15 (×6): qty 1

## 2017-04-15 MED ORDER — ONDANSETRON HCL 4 MG/2ML IJ SOLN
4.0000 mg | Freq: Four times a day (QID) | INTRAMUSCULAR | Status: DC | PRN
Start: 2017-04-15 — End: 2017-04-18

## 2017-04-15 MED ORDER — ACETAMINOPHEN 650 MG RE SUPP: 650.0000 mg | Freq: Four times a day (QID) | RECTAL | Status: DC | PRN

## 2017-04-15 MED ORDER — ONDANSETRON HCL 4 MG PO TABS: 4.0000 mg | ORAL_TABLET | Freq: Four times a day (QID) | ORAL | Status: DC | PRN

## 2017-04-15 MED ORDER — POTASSIUM CHLORIDE IN NACL 20-0.9 MEQ/L-% IV SOLN
INTRAVENOUS | Status: DC
  Filled 2017-04-15: qty 1000

## 2017-04-15 MED ORDER — LEVALBUTEROL HCL 0.63 MG/3ML IN NEBU: 0.6300 mg | INHALATION_SOLUTION | Freq: Four times a day (QID) | RESPIRATORY_TRACT | Status: DC | PRN

## 2017-04-15 MED ORDER — AMANTADINE HCL 100 MG PO CAPS
100.0000 mg | ORAL_CAPSULE | Freq: Two times a day (BID) | ORAL | Status: DC
  Administered 2017-04-15 – 2017-04-17 (×4): 100 mg via ORAL
  Filled 2017-04-15 (×5): qty 1

## 2017-04-15 MED ORDER — ENOXAPARIN SODIUM 40 MG/0.4ML ~~LOC~~ SOLN: 40.0000 mg | SUBCUTANEOUS | Status: DC

## 2017-04-15 MED ORDER — ACETAMINOPHEN 325 MG PO TABS: 650.0000 mg | ORAL_TABLET | Freq: Four times a day (QID) | ORAL | Status: DC | PRN

## 2017-04-15 NOTE — ED Notes (Addendum)
Pt refusing all bloodwork at this time. Pt states "You already got enough of my blood. I'm not giving you any more." Pt has rested in bed with even unlabored respirations for most of the night. Pt has voiced no complaints. No distress has been noted. Pt is in NSR on the monitor. Pt denies pain

## 2017-04-15 NOTE — Progress Notes (Signed)
PROGRESS NOTE    Kristen Colon  SPQ:[id number] DOB: [birth year] DOA: 04/13/2017 PCP: Everardo Beals, NP    Brief Narrative:  67 year old female who presented with delusions.  She does have significant past medical history of schizophrenia, hypertension, is affective disorder and recent diagnosis of ovarian cancer.  Patient was brought by her daughter due to decreased p.o. intake, lack of sleep, delusions and erratic behavior.  Patient was involuntarily committed due to the hospital due to patient's safety.  On the initial physical examination blood pressure 136/87, heart rate 85, respiratory 18, temperature 96.7, oxygen saturation 97%, moist mucous membranes, lungs clear to auscultation, heart S1-S2 present rhythmic, abdomen soft nontender, no lower extremity edema.  Sodium 138, potassium 4.1, chloride 103, bicarb 27, glucose 171, BUN 12, creatinine 0.79, white count 6.4, hemoglobin 13.5, hematocrit 40.2, platelets 390.  Urinalysis negative for infection, greater than 500 glucose.  Drug screen positive for benzodiazepines, alcohol less than 10.  CT of the abdomen with a large 14.5 cystic right ovarian tumor, with scattered abdominal throughout metastasis.  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  Chest x-ray clear for infiltrates.  EKG normal sinus rhythm, normal axis, normal intervals.  Patient was admitted to the hospital working diagnosis of acute encephalopathy, complicated by psychotic features.    Assessment & Plan:   Principal Problem:   Schizophrenia, undifferentiated ([place]) Active Problems:   Liver mass   Delusional disorder (HCC)   Schizoaffective disorder, chronic condition with acute exacerbation (HCC)   Acute encephalopathy   Ovarian mass   Hypertension, uncontrolled   Diabetes mellitus type 2 in nonobese ([place])   Ovarian cancer ([place])   1.  Acute metabolic encephalopathy. Patient is more  awake and alert, will continue neuro check per unit protocol. Patient has been refusing medications. Continue gentle hydration with IV fluids.   2.  Schizophrenia with psychosis and dementia. Patient has been involuntary committed, will follow on psychiatry recommendations. Continue benztropin, amantadine, memantine and bupropion. Continue temazapam and trazodone.   3.  Hypertension. Continue blood pressure monitoring. Continue amloidpine and lisinopril.   4.  Type 2 diabetes mellitus. Glucose cover and monitoring with insulin sliding scale. Patient tolerating po well.    DVT prophylaxis: enoxaparin   Code Status: full Family Communication: no family at the bedside  Disposition Plan: psych facility   Consultants:   psychiatry   Procedures:     Antimicrobials:      Subjective: Patient with no chest pain or dyspnea, no abdominal pain,no nausea or vomiting.   Objective: Vitals:   04/14/17 1807 04/15/17 0104 04/15/17 0619 04/15/17 1030  BP: 124/85 128/73 140/83 122/72  Pulse: 91 84 80 82  Resp: 18 20 20    Temp: 97.6 F (36.4 C) 97.7 F (36.5 C) 97.7 F (36.5 C)   TempSrc: Oral  Oral   SpO2: 99% 100% 97%     Intake/Output Summary (Last 24 hours) at 04/15/2017 1203 Last data filed at 04/15/2017 3335 Gross per 24 hour  Intake 960 ml  Output -  Net 960 ml   There were no vitals filed for this visit.  Examination:   General: Not in pain or dyspnea, deconditioned Neurology: Awake and alert, non focal  E ENT: no pallor, no icterus, oral mucosa moist Cardiovascular: No JVD. S1-S2 present, rhythmic, no gallops, rubs, or murmurs. No lower extremity edema. Pulmonary: vesicular breath sounds bilaterally, adequate air movement, no wheezing, rhonchi or rales. Gastrointestinal. Abdomen  with no organomegaly, non tender, no rebound or guarding Skin. No rashes Musculoskeletal: no joint deformities     Data Reviewed: I have personally reviewed following labs and imaging  studies  CBC: Recent Labs  Lab 04/13/17 1647  WBC 6.4  NEUTROABS 3.5  HGB 13.5  HCT 40.2  MCV 87.4  PLT 124   Basic Metabolic Panel: Recent Labs  Lab 04/13/17 1647  NA 138  K 4.1  CL 103  CO2 27  GLUCOSE 171*  BUN 12  CREATININE 0.79  CALCIUM 9.6   GFR: CrCl cannot be calculated (Unknown ideal weight.). Liver Function Tests: Recent Labs  Lab 04/13/17 1647  AST 25  ALT 22  ALKPHOS 85  BILITOT 0.3  PROT 7.6  ALBUMIN 4.2   No results for input(s): LIPASE, AMYLASE in the last 168 hours. No results for input(s): AMMONIA in the last 168 hours. Coagulation Profile: No results for input(s): INR, PROTIME in the last 168 hours. Cardiac Enzymes: No results for input(s): CKTOTAL, CKMB, CKMBINDEX, TROPONINI in the last 168 hours. BNP (last 3 results) No results for input(s): PROBNP in the last 8760 hours. HbA1C: No results for input(s): HGBA1C in the last 72 hours. CBG: No results for input(s): GLUCAP in the last 168 hours. Lipid Profile: No results for input(s): CHOL, HDL, LDLCALC, TRIG, CHOLHDL, LDLDIRECT in the last 72 hours. Thyroid Function Tests: No results for input(s): TSH, T4TOTAL, FREET4, T3FREE, THYROIDAB in the last 72 hours. Anemia Panel: No results for input(s): VITAMINB12, FOLATE, FERRITIN, TIBC, IRON, RETICCTPCT in the last 72 hours.    Radiology Studies: I have reviewed all of the imaging during this hospital visit personally     Scheduled Meds: . amLODipine  5 mg Oral Daily  . buPROPion  150 mg Oral Daily  . insulin aspart  0-9 Units Subcutaneous TID WC  . lactulose  10 g Oral Daily  . lisinopril  20 mg Oral Daily  . memantine  5 mg Oral BID  . multivitamin with minerals  1 tablet Oral Daily  . paliperidone  9 mg Oral Q supper  . temazepam  7.5 mg Oral QHS  . traZODone  50 mg Oral QHS   Continuous Infusions:   LOS: 1 day        Mauricio Gerome Apley, MD [facility] Pager [phone]

## 2017-04-15 NOTE — Consult Note (Addendum)
Kristen Colon  MRN:  [id number] Subjective:   Kristen Colon was initially seen by the psychiatry consult service on 3/26 and was admitted to the medical service yesterday for further workup of metastatic ovarian cancer. She had no acute events overnight. Invega was increased from 6 mg daily to 9 mg daily for psychosis. She is appropriate and calm throughout interview today. She sleep well overnight although she reports going to bed late due to watching television. She denies problems with appetite. She denies SI, HI or AVH. She asks about reducing her medications due to side effects (mouth stiffness and subjective tongue swelling).   Principal Problem: Schizophrenia, undifferentiated ([place]) Diagnosis:   Patient Active Problem List   Diagnosis Date Noted  . Ovarian cancer ([place]) [C56.9] 04/14/2017  . Ovarian mass [N83.9] 09/20/2016  . Non-intractable vomiting with nausea [R11.2] 09/20/2016  . Hypertension, uncontrolled [I10] 09/20/2016  . Diabetes mellitus type 2 in nonobese ([place]) [E11.9] 09/20/2016  . Acute encephalopathy [G93.40] 09/19/2016  . Schizoaffective disorder, chronic condition with acute exacerbation ([place]) [F25.8]   . Schizophrenia, undifferentiated ([place]) [F20.3] 06/21/2014  . Delusional disorder ([place]) [F22]   . Psychoses ([place]) [F29]   . Gallbladder polyp [K82.4] 07/21/2011  . Liver mass [R16.0] 07/21/2011   Total Time spent with patient: 15 minutes  Past Psychiatric History: Schizophrenia  Past Medical History:  Past Medical History:  Diagnosis Date  . Dementia   . Diabetes mellitus   . Hypertension   . Schizophrenia ([place])    History reviewed. No pertinent surgical history. Family History:  Family History  Problem Relation Age of Onset  . Heart disease Mother   . Diabetes Sister    Family Psychiatric  History: Unknown Social History:  Social History   Substance and Sexual Activity  Alcohol Use Yes     Social  History   Substance and Sexual Activity  Drug Use Yes  . Types: Marijuana    Social History   Socioeconomic History  . Marital status: Divorced    Spouse name: Not on file  . Number of children: Not on file  . Years of education: Not on file  . Highest education level: Not on file  Occupational History  . Not on file  Social Needs  . Financial resource strain: Not on file  . Food insecurity:    Worry: Not on file    Inability: Not on file  . Transportation needs:    Medical: Not on file    Non-medical: Not on file  Tobacco Use  . Smoking status: Current Every Day Smoker    Packs/day: 0.20    Types: Cigarettes  . Smokeless tobacco: Never Used  Substance and Sexual Activity  . Alcohol use: Yes  . Drug use: Yes    Types: Marijuana  . Sexual activity: Not on file    Comment: quit 5 years ago  Lifestyle  . Physical activity:    Days per week: Not on file    Minutes per session: Not on file  . Stress: Not on file  Relationships  . Social connections:    Talks on phone: Not on file    Gets together: Not on file    Attends religious service: Not on file    Active member of club or organization: Not on file    Attends meetings of clubs or organizations: Not on file    Relationship status: Not on file  Other Topics Concern  .  Not on file  Social History Narrative  . Not on file    Sleep: Good  Appetite:  Good  Current Medications: Current Facility-Administered Medications  Medication Dose Route Frequency Provider Last Rate Last Dose  . acetaminophen (TYLENOL) tablet 650 mg  650 mg Oral Q4H PRN Harris, Abigail, PA-C      . alum & mag hydroxide-simeth (MAALOX/MYLANTA) 200-200-20 MG/5ML suspension 30 mL  30 mL Oral Q6H PRN Harris, Abigail, PA-C      . amLODipine (NORVASC) tablet 5 mg  5 mg Oral Daily Patrecia Pour, NP   5 mg at 04/15/17 1159  . buPROPion (WELLBUTRIN XL) 24 hr tablet 150 mg  150 mg Oral Daily Patrecia Pour, NP   150 mg at 04/15/17 1200  .  insulin aspart (novoLOG) injection 0-9 Units  0-9 Units Subcutaneous TID WC Abrol, Nayana, MD      . lactulose (CHRONULAC) 10 GM/15ML solution 10 g  10 g Oral Daily Patrecia Pour, NP   10 g at 04/14/17 1241  . lisinopril (PRINIVIL,ZESTRIL) tablet 20 mg  20 mg Oral Daily Patrecia Pour, NP   20 mg at 04/15/17 1158  . memantine (NAMENDA) tablet 5 mg  5 mg Oral BID Patrecia Pour, NP   5 mg at 04/15/17 1200  . multivitamin with minerals tablet 1 tablet  1 tablet Oral Daily Patrecia Pour, NP   1 tablet at 04/15/17 1159  . ondansetron (ZOFRAN) tablet 4 mg  4 mg Oral Q8H PRN Harris, Abigail, PA-C      . paliperidone (INVEGA) 24 hr tablet 9 mg  9 mg Oral Q supper Priscille Loveless S, FNP   9 mg at 04/14/17 1710  . polyethylene glycol (MIRALAX / GLYCOLAX) packet 17 g  17 g Oral Daily PRN Faythe Dingwall, DO      . temazepam (RESTORIL) capsule 7.5 mg  7.5 mg Oral QHS Patrecia Pour, NP   7.5 mg at 04/13/17 2258  . traZODone (DESYREL) tablet 50 mg  50 mg Oral QHS Patrecia Pour, NP   50 mg at 04/14/17 2217   Current Outpatient Medications  Medication Sig Dispense Refill  . amantadine (SYMMETREL) 100 MG capsule Take 100 mg by mouth 2 (two) times daily.    Marland Kitchen amLODipine (NORVASC) 5 MG tablet Take 1 tablet (5 mg total) by mouth daily. 30 tablet 3  . Ascorbic Acid (VITAMIN C PO) Take by mouth.    Marland Kitchen buPROPion (WELLBUTRIN XL) 150 MG 24 hr tablet Take 150 mg by mouth daily.    . Cyanocobalamin (VITAMIN B 12 PO) Take by mouth.    . INVEGA 6 MG 24 hr tablet Take 6 mg by mouth daily with supper.  1  . INVEGA SUSTENNA 156 MG/ML SUSP injection Inject 1 mL (156 mg total) into the muscle every 30 (thirty) days. 0.9 mL 0  . lactulose (CHRONULAC) 10 GM/15ML solution Take 10 g by mouth daily.  3  . linaclotide (LINZESS) 290 MCG CAPS capsule Take 290 mg by mouth daily.    Marland Kitchen lisinopril (PRINIVIL,ZESTRIL) 10 MG tablet Take 20 mg by mouth daily.     . memantine (NAMENDA) 10 MG tablet Take 5 mg by mouth 2 (two)  times daily.   1  . metFORMIN (GLUCOPHAGE) 500 MG tablet Take 500 mg by mouth daily with breakfast.     . Multiple Vitamin (MULITIVITAMIN WITH MINERALS) TABS Take 1 tablet by mouth daily.    Marland Kitchen thiamine 100  MG tablet Take 1 tablet (100 mg total) by mouth daily. 30 tablet 3  . traZODone (DESYREL) 50 MG tablet Take 50 mg by mouth at bedtime.  1  . oxyCODONE (OXY IR/ROXICODONE) 5 MG immediate release tablet Take 1-2 tablets by mouth every 8 (eight) hours as needed. pain  0  . temazepam (RESTORIL) 7.5 MG capsule Take 1 capsule (7.5 mg total) by mouth at bedtime. 30 capsule 0    Lab Results:  Results for orders placed or performed during the hospital encounter of 04/13/17 (from the past 48 hour(s))  Comprehensive metabolic panel     Status: Abnormal   Collection Time: 04/13/17  4:47 PM  Result Value Ref Range   Sodium 138 135 - 145 mmol/L   Potassium 4.1 3.5 - 5.1 mmol/L   Chloride 103 101 - 111 mmol/L   CO2 27 22 - 32 mmol/L   Glucose, Bld 171 (H) 65 - 99 mg/dL   BUN 12 6 - 20 mg/dL   Creatinine, Ser 0.79 0.44 - 1.00 mg/dL   Calcium 9.6 8.9 - 10.3 mg/dL   Total Protein 7.6 6.5 - 8.1 g/dL   Albumin 4.2 3.5 - 5.0 g/dL   AST 25 15 - 41 U/L   ALT 22 14 - 54 U/L   Alkaline Phosphatase 85 38 - 126 U/L   Total Bilirubin 0.3 0.3 - 1.2 mg/dL   GFR calc non Af Amer >60 >60 mL/min   GFR calc Af Amer >60 >60 mL/min    Comment: (NOTE) The eGFR has been calculated using the CKD EPI equation. This calculation has not been validated in all clinical situations. eGFR's persistently <60 mL/min signify possible Chronic Kidney Disease.    Anion gap 8 5 - 15    Comment: Performed at [facility], [place] [address], [place], [place] [address]  Ethanol     Status: None   Collection Time: 04/13/17  4:47 PM  Result Value Ref Range   Alcohol, Ethyl (B) <10 <10 mg/dL    Comment:        LOWEST DETECTABLE LIMIT FOR SERUM ALCOHOL IS 10 mg/dL FOR MEDICAL PURPOSES ONLY Performed at [facility], [place] [address] [place], [place] [address]   Urine rapid drug screen (hosp performed)     Status: Abnormal   Collection Time: 04/13/17  4:47 PM  Result Value Ref Range   Opiates NONE DETECTED NONE DETECTED   Cocaine NONE DETECTED NONE DETECTED   Benzodiazepines POSITIVE (A) NONE DETECTED   Amphetamines NONE DETECTED NONE DETECTED   Tetrahydrocannabinol NONE DETECTED NONE DETECTED   Barbiturates NONE DETECTED NONE DETECTED    Comment: (NOTE) DRUG SCREEN FOR MEDICAL PURPOSES ONLY.  IF CONFIRMATION IS NEEDED FOR ANY PURPOSE, NOTIFY LAB WITHIN 5 DAYS. LOWEST DETECTABLE LIMITS FOR URINE DRUG SCREEN Drug Class                     Cutoff (ng/mL) Amphetamine and metabolites    1000 Barbiturate and metabolites    200 Benzodiazepine                 027 Tricyclics and metabolites     300 Opiates and metabolites        300 Cocaine and metabolites        300 THC                            50 Performed  at [facility], [place] [address] [place], [place] [address]   CBC with Diff     Status: Abnormal   Collection Time: 04/13/17  4:47 PM  Result Value Ref Range   WBC 6.4 4.0 - 10.5 K/uL   RBC 4.60 3.87 - 5.11 MIL/uL   Hemoglobin 13.5 12.0 - 15.0 g/dL   HCT 40.2 36.0 - 46.0 %   MCV 87.4 78.0 - 100.0 fL   MCH 29.3 26.0 - 34.0 pg   MCHC 33.6 30.0 - 36.0 g/dL   RDW 16.6 (H) 11.5 - 15.5 %   Platelets 309 150 - 400 K/uL   Neutrophils Relative % 54 %   Neutro Abs 3.5 1.7 - 7.7 K/uL   Lymphocytes Relative 39 %   Lymphs Abs 2.5 0.7 - 4.0 K/uL   Monocytes Relative 6 %   Monocytes Absolute 0.4 0.1 - 1.0 K/uL   Eosinophils Relative 1 %   Eosinophils Absolute 0.0 0.0 - 0.7 K/uL   Basophils Relative 0 %   Basophils Absolute 0.0 0.0 - 0.1 K/uL    Comment: Performed at [facility], [place] [address] [place], [place] [address]  CA 125     Status: Abnormal   Collection Time: 04/13/17  5:07 PM  Result Value Ref Range   Cancer Antigen  (CA) 125 513.8 (H) 0.0 - 38.1 U/mL    Comment: (NOTE) Roche Diagnostics Electrochemiluminescence Immunoassay (ECLIA) Values obtained with different assay methods or kits cannot be used interchangeably.  Results cannot be interpreted as absolute evidence of the presence or absence of malignant disease. Performed At: [facility] [place], [place] [id number] Rush Farmer MD EX:[id number] Performed at [facility], [place] [address], [place], [place] [address]   Urinalysis, Routine w reflex microscopic     Status: Abnormal   Collection Time: 04/14/17  3:03 PM  Result Value Ref Range   Color, Urine YELLOW YELLOW   APPearance CLEAR CLEAR   Specific Gravity, Urine 1.015 1.005 - 1.030   pH 7.0 5.0 - 8.0   Glucose, UA >=500 (A) NEGATIVE mg/dL   Hgb urine dipstick NEGATIVE NEGATIVE   Bilirubin Urine NEGATIVE NEGATIVE   Ketones, ur NEGATIVE NEGATIVE mg/dL   Protein, ur NEGATIVE NEGATIVE mg/dL   Nitrite NEGATIVE NEGATIVE   Leukocytes, UA NEGATIVE NEGATIVE   RBC / HPF 0-5 0 - 5 RBC/hpf   WBC, UA 0-5 0 - 5 WBC/hpf   Bacteria, UA NONE SEEN NONE SEEN   Squamous Epithelial / LPF 0-5 (A) NONE SEEN   Mucus PRESENT     Comment: Performed at [facility], [place] [address], [place],  [address]    Blood Alcohol level:  Lab Results  Component Value Date   [facility] <10 04/13/2017   ETH <5 09/19/2016    Musculoskeletal: Strength & Muscle Tone: within normal limits Gait & Station: normal Patient leans: N/A  Psychiatric Specialty Exam: Physical Exam  Nursing note and vitals reviewed. Constitutional: She appears well-developed and well-nourished.  HENT:  Head: Normocephalic and atraumatic.  Neck: Normal range of motion.  Respiratory: Effort normal.  Neurological: She is alert.  Oriented to person and place.   Psychiatric: She has a normal mood and affect. Her speech is normal and behavior is normal. Thought content normal.  Cognition and memory are impaired. She expresses impulsivity.    Review of Systems  Psychiatric/Behavioral: Negative for hallucinations and suicidal ideas. The patient does not have insomnia.  All other systems reviewed and are negative.   Blood pressure 122/72, pulse 82, temperature 97.7 F (36.5 C), temperature source Oral, resp. rate 20, SpO2 97 %.There is no height or weight on file to calculate BMI.  General Appearance: Fairly Groomed, elderly, African American female, wearing paper hospital scrubs and lying in bed. NAD.   Eye Contact:  Good  Speech:  Clear and Coherent and Normal Rate  Volume:  Normal  Mood:  Euthymic  Affect:  Congruent  Thought Process:  Goal Directed, Linear and Descriptions of Associations: Intact  Orientation:  Other:  Person and place.  Thought Content:  Logical  Suicidal Thoughts:  No  Homicidal Thoughts:  No  Memory:  Immediate;   Fair Recent;   Fair Remote;   Fair  Judgement:  Impaired  Insight:  Lacking  Psychomotor Activity:  Normal  Concentration:  Concentration: Good and Attention Span: Good  Recall:  [employer] of Knowledge:  Fair  Language:  Fair  Akathisia:  No  Handed:  Right  AIMS (if indicated):   N/A  Assets:  Housing Social Support  ADL's:  Intact  Cognition:  Impaired due to psychiatric illness.   Sleep:   Okay   Assessment:  AVRIELLE FRY is a 67 y.o. female who was admitted under IVC placed by her daughter for worsening psychosis after receiving LAI 2.5 weeks late. Oral Invega was increased yesterday. Patient is appropriate in behavior and organized in thought process. She denies SI, HI and AVH today. There were no acute events overnight. Will start Cogentin due to reported side effects that are concerning for EPS.    Treatment Plan Summary: -Continue Wellbutrin 150 mg daily for depression. -Continue Invega 9 mg daily for psychosis. Patient receives monthly Invega 156 mg injection. -Recommend starting Cogentin 0.5 mg BID  for EPS. -Continue Restoril 7.5 mg qhs and Trazodone 50 mg qhs for insomnia.  -Continue Namenda 5 mg BID for dementia.  -Patient shows improvement since hospitalization with medication adjustments and may be psychiatrically stable for discharge once medically cleared.   Faythe Dingwall, DO 04/15/2017, 12:56 PM   Addendum: Spoke to primary team and patient is medically cleared. Informed primary team that she is psychiatrically cleared from a psychiatric standpoint. Her medication was increased on admission and she has not had any concerning psychiatric symptoms such as overt psychosis or behavioral problems since admission. She should continue medication management with her outpatient psychiatrist. She will be released from IVC.   Buford Dresser, DO 04/16/17 1:12 PM

## 2017-04-15 NOTE — ED Notes (Signed)
Pt ate about 25% of lunch and drank 240 cc lemonade.

## 2017-04-15 NOTE — Progress Notes (Signed)
Patient's CT scan and recent CA 125 level reviewed with Dr. Precious Haws, GYN Oncologist.  Patient will be in need of an omental biopsy to confirm diagnosis of probable metastatic ovarian cancer on CT done in ED.  Patient currently holding in ED under IVC.  Spoke with ED physician this am.  Once patient stable from a psychological standpoint, we can see the patient as a consult inpatient or she can be seen outpatient. Will continue to follow.

## 2017-04-15 NOTE — Progress Notes (Signed)
Patient refusing all CBGs, IV, and lab draws.  MD notified.

## 2017-04-15 NOTE — ED Notes (Signed)
Report given to Hannah RN

## 2017-04-15 NOTE — ED Notes (Signed)
Pt refusing CBG

## 2017-04-15 NOTE — ED Notes (Signed)
Pt transported to room 1517.

## 2017-04-15 NOTE — ED Notes (Signed)
Attempted to call report. Spoke with Jarrett Soho who will call back.

## 2017-04-15 NOTE — ED Notes (Signed)
Pt refused to let staff check her glucose.

## 2017-04-15 NOTE — ED Notes (Signed)
Pt refusing to allow staff to check her glucose.

## 2017-04-16 ENCOUNTER — Other Ambulatory Visit: Payer: Self-pay | Admitting: Radiology

## 2017-04-16 ENCOUNTER — Other Ambulatory Visit: Payer: Self-pay

## 2017-04-16 ENCOUNTER — Telehealth: Payer: Self-pay | Admitting: Gynecologic Oncology

## 2017-04-16 DIAGNOSIS — E119 Type 2 diabetes mellitus without complications: Secondary | ICD-10-CM | POA: Diagnosis not present

## 2017-04-16 DIAGNOSIS — F22 Delusional disorders: Secondary | ICD-10-CM | POA: Diagnosis not present

## 2017-04-16 DIAGNOSIS — N839 Noninflammatory disorder of ovary, fallopian tube and broad ligament, unspecified: Secondary | ICD-10-CM

## 2017-04-16 DIAGNOSIS — F203 Undifferentiated schizophrenia: Secondary | ICD-10-CM | POA: Diagnosis not present

## 2017-04-16 DIAGNOSIS — F258 Other schizoaffective disorders: Secondary | ICD-10-CM | POA: Diagnosis not present

## 2017-04-16 DIAGNOSIS — I10 Essential (primary) hypertension: Secondary | ICD-10-CM | POA: Diagnosis not present

## 2017-04-16 DIAGNOSIS — R4689 Other symptoms and signs involving appearance and behavior: Secondary | ICD-10-CM | POA: Diagnosis not present

## 2017-04-16 LAB — GLUCOSE, CAPILLARY: Glucose-Capillary: 112 mg/dL — ABNORMAL HIGH (ref 65–99)

## 2017-04-16 MED ORDER — LINACLOTIDE 290 MCG PO CAPS: 290.0000 ug | ORAL_CAPSULE | Freq: Every day | ORAL | 0 refills | Status: DC

## 2017-04-16 MED ORDER — BENZTROPINE MESYLATE 0.5 MG PO TABS: 0.5000 mg | ORAL_TABLET | Freq: Two times a day (BID) | ORAL | 0 refills | Status: DC

## 2017-04-16 MED ORDER — PALIPERIDONE ER 9 MG PO TB24: 9.0000 mg | ORAL_TABLET | ORAL | 0 refills | Status: DC

## 2017-04-16 MED ORDER — ENSURE ENLIVE PO LIQD
237.0000 mL | Freq: Two times a day (BID) | ORAL | Status: DC
  Administered 2017-04-16: 237 mL via ORAL

## 2017-04-16 NOTE — Progress Notes (Signed)
IVC orders d/c.  Sitter at bedside d/c per MD orders.  Daughter called and said she would bring paperwork for guardianship. Virginia Rochester, RN

## 2017-04-16 NOTE — Progress Notes (Signed)
Patient resting quietly in the chair.  Sitter at the bedside but no family present. CT findings discussed.  Discussed that findings are worrisome for metastatic ovarian cancer.  Patient does not remember seeing GYN Oncology last fall for an ovarian mass and states she did not think she "would have to see another doctor in the new year."  Patient reports that she feels her abdomen has grown and she is unable to keep her shirt down over her stomach.  States she feels she has lost weight in her extremities.  Denies early satiety, abdominal pain, nausea, vomiting, vaginal bleeding, bleeding from bladder or rectum.   Advised patient of the recommendation for a biopsy of the omentum to confirm diagnosis.  Unsure if patient was able to grasp the information given.  Will reach out to the patient's daughter.  Plan for follow up appointment in the office on April 22, 2017 with Dr. Denman George to discuss CT findings in more detail to and discuss recommendations moving forward.  Dr. Heath Lark is aware of the patient's situation as well.  Will move forward with scheduling IR biopsy in the outpatient setting.

## 2017-04-16 NOTE — Consult Note (Signed)
Chief Complaint: Patient was seen in consultation today for image guided biopsy of omental mass Chief Complaint  Patient presents with  . IVC  . Aggressive Behavior     Referring Physician(s): Rossi,E  Supervising Physician: Kristen Colon  Patient Status: [facility] - In-pt  History of Present Illness: Kristen Colon is a 67 y.o. female with history of dementia, schizophrenia, diabetes, hypertension recently admitted to [facility] with decreased p.o. intake, lack of sleep, delusions and erratic behavior.  Past imaging dating back to 2016 has noted right ovarian cystic mass.  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  There was small volume ascites, trace pleural effusions as well as occluded left common iliac artery.  Latest CA 125 is 513.8. Request received from GYN oncology for image guided omental mass biopsy for further evaluation.  Past Medical History:  Diagnosis Date  . Dementia   . Diabetes mellitus   . Hypertension   . Schizophrenia ([place])     History reviewed. No pertinent surgical history.  Allergies: Hydrocodone  Medications: Prior to Admission medications   Medication Sig Start Date End Date Taking? Authorizing Provider  amantadine (SYMMETREL) 100 MG capsule Take 100 mg by mouth 2 (two) times daily.   Yes [provider]  amLODipine (NORVASC) 5 MG tablet Take 1 tablet (5 mg total) by mouth daily. 09/24/16  Yes Rai, Ripudeep K, MD  Ascorbic Acid (VITAMIN C PO) Take by mouth.   Yes [provider]  buPROPion (WELLBUTRIN XL) 150 MG 24 hr tablet Take 150 mg by mouth daily.   Yes [provider]  Cyanocobalamin (VITAMIN B 12 PO) Take by mouth.   Yes [provider]  INVEGA 6 MG 24 hr tablet Take 6 mg by mouth daily with supper. 08/12/16  Yes [provider]  INVEGA SUSTENNA 156 MG/ML SUSP injection  Inject 1 mL (156 mg total) into the muscle every 30 (thirty) days. 09/05/14  Yes Dorie Rank, MD  lactulose [facility]) 10 GM/15ML solution Take 10 g by mouth daily. 08/12/16  Yes [provider]  lisinopril (PRINIVIL,ZESTRIL) 10 MG tablet Take 20 mg by mouth daily.    Yes [provider]  memantine (NAMENDA) 10 MG tablet Take 5 mg by mouth 2 (two) times daily.  08/12/16  Yes [provider]  metFORMIN (GLUCOPHAGE) 500 MG tablet Take 500 mg by mouth daily with breakfast.    Yes [provider]  Multiple Vitamin (MULITIVITAMIN WITH MINERALS) TABS Take 1 tablet by mouth daily.   Yes [provider]  thiamine 100 MG tablet Take 1 tablet (100 mg total) by mouth daily. 09/24/16  Yes Rai, Ripudeep K, MD  traZODone (DESYREL) 50 MG tablet Take 50 mg by mouth at bedtime. 08/12/16  Yes [provider]  benztropine (COGENTIN) 0.5 MG tablet Take 1 tablet (0.5 mg total) by mouth 2 (two) times daily. 04/16/17 05/16/17  Arrien, Jimmy Picket, MD  linaclotide [facility] Lipa) 290 MCG CAPS capsule Take 1 capsule (290 mcg total) by mouth daily. 04/16/17 05/16/17  Arrien, Jimmy Picket, MD  oxyCODONE (OXY IR/ROXICODONE) 5 MG immediate release tablet Take 1-2 tablets by mouth every 8 (eight) hours as needed. pain 12/19/14   [provider]  paliperidone (INVEGA) 9 MG 24 hr tablet Take 1 tablet (9 mg total) by mouth every morning. 04/16/17 05/16/17  Arrien, Jimmy Picket, MD  temazepam (RESTORIL) 7.5 MG capsule Take  1 capsule (7.5 mg total) by mouth at bedtime. 09/23/16   Mendel Corning, MD     Family History  Problem Relation Age of Onset  . Heart disease Mother   . Diabetes Sister     Social History   Socioeconomic History  . Marital status: Divorced    Spouse name: Not on file  . Number of children: Not on file  . Years of education: Not on file  . Highest education level: Not on file  Occupational History  . Not on file  Social Needs  . Financial  resource strain: Not on file  . Food insecurity:    Worry: Not on file    Inability: Not on file  . Transportation needs:    Medical: Not on file    Non-medical: Not on file  Tobacco Use  . Smoking status: Current Every Day Smoker    Packs/day: 0.20    Types: Cigarettes  . Smokeless tobacco: Never Used  Substance and Sexual Activity  . Alcohol use: Yes  . Drug use: Yes    Types: Marijuana  . Sexual activity: Not on file    Comment: quit 5 years ago  Lifestyle  . Physical activity:    Days per week: Not on file    Minutes per session: Not on file  . Stress: Not on file  Relationships  . Social connections:    Talks on phone: Not on file    Gets together: Not on file    Attends religious service: Not on file    Active member of club or organization: Not on file    Attends meetings of clubs or organizations: Not on file    Relationship status: Not on file  Other Topics Concern  . Not on file  Social History Narrative  . Not on file      Review of Systems see above; patient currently resting quietly in bed, no acute complaints at present but has had previous episodes of agitation.  Vital Signs: BP 106/76 (BP Location: Right Arm)   Pulse 99   Temp 98.3 F (36.8 C) (Oral)   Resp 13   SpO2 98%   Physical Exam patient awake, does follow commands.  Chest clear to auscultation bilaterally.  Heart with regular rate and rhythm.  Abdomen soft, positive bowel sounds, nontender.  No lower extremity edema.  Imaging: Dg Chest 2 View  Result Date: 04/14/2017 CLINICAL DATA:  Hallucinations. History of diabetes, hypertension, current smoker. EXAM: CHEST - 2 VIEW COMPARISON:  Chest x-ray of April 11, 2011 FINDINGS: The lungs are adequately inflated. The interstitial markings remain coarse. There is a trace of blunting of the costophrenic angles bilaterally. The heart and pulmonary vascularity are normal. The mediastinum is normal in width. The trachea is midline. The bony thorax  exhibits no acute abnormality. IMPRESSION: Minimal blunting of the costophrenic angles is new. No alveolar pneumonia nor pulmonary edema. Stable chronic interstitial prominence likely reflects the patient's smoking history. Electronically Signed   By: David  Martinique M.D.   On: 04/14/2017 14:26   Ct Abdomen Pelvis W Contrast  Result Date: 04/13/2017 CLINICAL DATA:  67 year old female with palpable pelvic mass, chronic large cystic ovarian neoplasm. EXAM: CT ABDOMEN AND PELVIS WITH CONTRAST TECHNIQUE: Multidetector CT imaging of the abdomen and pelvis was performed using the standard protocol following bolus administration of intravenous contrast. CONTRAST:  113mL ISOVUE-300 IOPAMIDOL (ISOVUE-300) INJECTION 61% COMPARISON:  CT Abdomen and Pelvis 09/19/2016 and earlier. FINDINGS: Lower chest: No lower  lobe lung nodule or mass identified, but trace bilateral layering pleural fluid is new since 2018. No pericardial effusion. Hepatobiliary: Chronic cholelithiasis. No pericholecystic inflammation. No biliary ductal enlargement. Stable liver parenchyma with anterior left lobe hemangioma and central benign hepatic cyst (the latter on series 2, image 16) unchanged. Pancreas: Negative. Spleen: Negative. Adrenals/Urinary Tract: Normal adrenal glands. Bilateral kidneys are stable. Bilateral renal enhancement and contrast excretion is symmetric and within normal limits. 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 It is unclear how distally the ureter duplication persists. The urinary bladder is moderately distended and unremarkable. Stomach/Bowel: Definitive omental metastatic disease is demonstrated today, including nodular enhancing oval foci measuring 10 x 30 millimeters at the root of the greater omentum anterior to the liver (series 2 image 12 in 15), and additional scattered pathologic omental nodularity throughout the bilateral abdomen (series 2, images 37 and 38). Small volume associated ascites. No bowel  obstruction. Moderate retained stool in redundant large bowel. No dilated small bowel. No free air. Mildly to moderately distended stomach with fluid and food debris. Decompressed duodenum and proximal small bowel. Vascular/Lymphatic: Occluded left common iliac artery (series 2, image 49) with reconstituted enhancement in the left external and internal iliac. Advanced Aortoiliac calcified atherosclerosis. The other major arterial structures in the abdomen and pelvis remain patent. Portal venous system is patent. No discrete lymphadenopathy in the abdomen or pelvis. Reproductive: Mild additional enlargement of the chronic large predominantly cystic pelvic mass thought to arise from the right ovary. Today this is 11.4 x 14.1 x 14.5 centimeters (AP by transverse by CC) versus 10.4 x 13.4 by 13.4 centimeters previously. There is also been enlargement of the heterogeneous and indistinct left ovary which now measures up to 5 centimeters diameter (previously 4.3 centimeters). The uterus appears diminutive. There are small calcified uterine fibroids suspected. Other: Trace pelvic fluid similar to 2018. Musculoskeletal: No acute or suspicious osseous lesion identified. IMPRESSION: 1. 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 Small volume ascites. Mildly enlarged and heterogeneous left ovary (up to 5 cm now). 2. New trace pleural effusions are indeterminate. No lung base nodule or mass is identified. 3. 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 Advanced Aortic Atherosclerosis (ICD10-I70.0). 4. Retained stool in redundant colon, but no bowel obstruction. Electronically Signed   By: Genevie Ann M.D.   On: 04/13/2017 19:47    Labs:  CBC: Recent Labs    08/21/16 2027 09/19/16 1437 09/20/16 0625 04/13/17 1647  WBC 11.2* 9.1 8.7 6.4  HGB  13.2 13.6 13.1 13.5  HCT 40.4 40.7 39.2 40.2  PLT 332 289 293 309    COAGS: No results for input(s): INR, APTT in the last 8760 hours.  BMP: Recent Labs    09/21/16 0527 09/22/16 0333 09/23/16 0538 04/13/17 1647  NA 137 137 138 138  K 4.3 3.7 4.3 4.1  CL 105 105 106 103  CO2 22 23 24 27   GLUCOSE 170* 109* 131* 171*  BUN 14 12 12 12   CALCIUM 9.0 9.0 9.1 9.6  CREATININE 0.69 0.83 0.77 0.79  GFRNONAA >60 >60 >60 >60  GFRAA >60 >60 >60 >60    LIVER FUNCTION TESTS: Recent Labs    08/21/16 2027 09/19/16 1437 04/13/17 1647  BILITOT 0.3 0.5 0.3  AST 17 28 25   ALT 23 35 22  ALKPHOS 74 87 85  PROT 7.2 7.4  7.6  ALBUMIN 3.9 3.8 4.2    TUMOR MARKERS: No results for input(s): AFPTM, CEA, CA199, CHROMGRNA in the last 8760 hours.  Assessment/plan:  67 y.o. female with history of dementia, schizophrenia, diabetes, hypertension recently admitted to [facility] with decreased p.o. intake, lack of sleep, delusions and erratic behavior.  Past imaging dating back to 2016 has noted right ovarian cystic mass.  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  There was small volume ascites, trace pleural effusions as well as occluded left common iliac artery.  Latest CA 125 is 513.8. Request received from GYN oncology for image guided omental mass biopsy for further evaluation.  Imaging studies have been reviewed by Dr. Kathlene Cote and most accessible area appears in the epigastric region anterior to the liver, most easily done via ultrasound guidance.  Details/risks of above procedure, including but not limited to, internal bleeding, infection, injury to adjacent structures discussed with patient's daughter Kristen Colon with her understanding and consent.  She is aware that if patient cannot tolerate IV conscious sedation for procedure and becomes agitated procedure may  have to be canceled and possibly rescheduled via general anesthesia if still desired.  Procedure tentatively scheduled for 3/29.  Lovenox will need to be held until after above procedure.    Thank you for this interesting consult.  I greatly enjoyed meeting GEORGI TUEL and look forward to participating in their care.  A copy of this report was sent to the requesting provider on this date.  Electronically Signed: D. Rowe Robert, PA-C 04/16/2017, 3:03 PM   I spent a total of  25 minutes   in face to face in clinical consultation, greater than 50% of which was counseling/coordinating care for image guided omental mass biopsy

## 2017-04-16 NOTE — Telephone Encounter (Signed)
Spoke with the patient's daughter.  Discussed CT scan with need to have a biopsy to confirm diagnosis as the first steps.  She states the biopsy really must be done while she is in the hospital because she will have an extremely hard time getting her mother to come back.  Advised daughter that I would reach back out to Dr. Cathlean Sauer to discuss and would call her back with an update.  Update: Returned call to the daughter and informed her that we would try to get the biopsy done while her mother is still inpatient.  She states it would be better for her schedule if her mother was discharged on Saturday or Sunday.  Advised her that the discussion was that if the biopsy could be performed early tomorrow that her mother may be released tomorrow afternoon.  She continued to voice concerns about what is best for her schedule and advised her that her concerns would be passed along to the attending team.  Daughter becomes frustrated when discussing follow up appt in the office for Wednesday and states it must be in the afternoon.  She also states she is mad because she feels like the mass should have been removed when it was small and that she is "going to deal with that doctor when I see her cause she caused the problem."  Attempted to redirect the conversation and advised her that the biopsy would be the next recommended step. She continued to bring up surgery and advised her that in advanced cases, surgery may not be warranted but chemotherapy would be recommended.  Redirected the conversation and stated we will attempt to get the biopsy prior to her mother leaving the hospital.  She states she will need to be given medication and advised her that IR would evaluate first.  Will continue to follow.

## 2017-04-16 NOTE — Progress Notes (Signed)
Initial Nutrition Assessment  DOCUMENTATION CODES:   Not applicable  INTERVENTION:  Ensure Enlive po BID, each supplement provides 350 kcal and 20 grams of protein  Magic cup TID with meals, each supplement provides 290 kcal and 9 grams of protein  NUTRITION DIAGNOSIS:   Increased nutrient needs related to cancer and cancer related treatments as evidenced by estimated needs.   GOAL:   Patient will meet greater than or equal to 90% of their needs   MONITOR:   PO intake, Supplement acceptance, Weight trends  REASON FOR ASSESSMENT:   Malnutrition Screening Tool    ASSESSMENT:   67 yo female involuntarily admitted on 3/25 due to decreased PO, delusions, erratic behavior, lack of sleep in the setting of a recent diagnosis of ovarian cancer. Active problems include liver mass, acute on chronic schizoaffective disorder, acute encephalopathy, HTN, T2DM.   Per Psychiatry note from 3/27 Pt denies problems with appetite  Pt reports "mouth stiffness" and "tongue swelling"--pt reported similar issue during nutrition assessment interview  Spoke with pt who was sitting in her recliner with sitter in the room. Pt reported:  She has not been eating because she is not hungry; she was unable to elaborate more when additional follow-up questions were asked.  She thinks she has had poor appetite for about a year  She complained of discomfort in her abdomen, which is very distended.   Denies N/V, no issues swallowing; c/o constipation that required an enema  She has not cared for the hospital food (Per RN notes, PO intake ~25%),   She does like the Ensure she had earlier and also likes ice cream. Her favorite flavor is vanilla  The patient has not been weighed since admission d/t refusal to comply with RN requests to collect vitals/labs. The sitter stated that she would try to collect a weight after pt has lunch.   Pt refused to get weight on admission; there are no recent weights  in Care Everywhere. Sitter said she would try to collect a weight when pt is amenable. Wt Readings from Last 15 Encounters:  10/20/16 126 lb 6.4 oz (57.3 kg)  09/20/16 126 lb 8 oz (57.4 kg)  09/06/14 121 lb (54.9 kg)  03/14/14 123 lb (55.8 kg)  06/10/13 121 lb (54.9 kg)  07/21/11 125 lb 6.4 oz (56.9 kg)  10/03/10 129 lb 14.4 oz (58.9 kg)     Medications: insulin SS, lactulose, IV NaCl @ 75 ml/hr  Labs reviewed.  NUTRITION - FOCUSED PHYSICAL EXAM:    Most Recent Value  Orbital Region  Mild depletion  Upper Arm Region  Mild depletion  Thoracic and Lumbar Region  No depletion  Buccal Region  Moderate depletion  Temple Region  Moderate depletion  Clavicle and Acromion Bone Region  No depletion  Scapular Bone Region  No depletion  Dorsal Hand  No depletion  Patellar Region  Mild depletion  Anterior Thigh Region  Moderate depletion  Posterior Calf Region  Mild depletion  Edema (RD Assessment)  None  Hair  Reviewed  Eyes  Reviewed  Mouth  Reviewed  Skin  Reviewed  Nails  Reviewed       Diet Order:  Diet Carb Modified Fluid consistency: Thin; Room service appropriate? Yes  EDUCATION NEEDS:   Not appropriate for education at this time  Skin:  Skin Assessment: Reviewed RN Assessment  Last BM:  3/28  Height:   Ht Readings from Last 1 Encounters:  10/20/16 5\' 5"  (1.651 m)   May be  inaccurate: previous records report 157.5 cm (5\' 2" )  Weight:   Wt Readings from Last 1 Encounters:  10/20/16 126 lb 6.4 oz (57.3 kg)    Ideal Body Weight:     BMI:  21.0 kg/m2 (based on ht/wt from 10/2016  Estimated Nutritional Needs:   Kcal:  1850-2050 kcal (33-35 kcal/kg)  Protein:  93-103 grams (20% kcal)  Fluid:  1.9 L/d    Edmonia Lynch Dietetic Intern Pager: [phone]

## 2017-04-16 NOTE — Discharge Summary (Signed)
Physician Discharge Summary  Kristen Colon CHE:[id number] DOB: Feb 26, 1950 DOA: 04/13/2017  PCP: Everardo Beals, NP  Admit date: 04/13/2017 Discharge date: 04/16/2017  Admitted From: Home Disposition:  Home  Recommendations for Outpatient Follow-up and new medication changes:  1. Follow up with PCP in 1- week 2. Follow up with [name] in 7 days at the GYN-Oncology clinic 3. Patient has been placed on Cogentin 0.5 mg bid 4. Increased Paliperidone at 9 mg daily 5. Stopped amantadine   Home Health: no  Equipment/Devices: no   Discharge Condition: stable CODE STATUS: full  Diet recommendation:  Heart healthy and diabetic prudent.   Brief/Interim Summary: 67 year old female who presented with delusions.  She does have the  significant past medical history of schizophrenia, hypertension,  schizoaffective disorder and recent diagnosis of ovarian cancer.  Patient was brought by her daughter due to decreased p.o. intake, lack of sleep, delusions and erratic behavior.  Patient was involuntarily committed due to the hospital due to patient's safety.  On the initial physical examination blood pressure 136/87, heart rate 85, respiratory 18, temperature 96.7, oxygen saturation 97%, moist mucous membranes, lungs clear to auscultation, heart S1-S2 present rhythmic, abdomen soft and nontender, no lower extremity edema.  Sodium 138, potassium 4.1, chloride 103, bicarb 27, glucose 171, BUN 12, creatinine 0.79, white count 6.4, hemoglobin 13.5, hematocrit 40.2, platelets 390.  Urinalysis negative for infection, greater than 500 glucose.  Drug screen positive for benzodiazepines, alcohol less than 10.  CT of the abdomen with a large 14.5 cystic right ovarian tumor, with scattered metastasis throughout the abdomen.   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  Chest x-ray clear for infiltrates.  EKG normal sinus rhythm, normal  axis, normal intervals.  Patient was admitted to the hospital with the working diagnosis of acute encephalopathy, complicated by psychotic features.   1. Acute metabolic encephalopathy. Patient responded well to medical therapy, her by mouth intake increased, no further agitation or confusion.Patient received IV fluids and had remote telemetry monitoring.   2. Schizoaffective disorder with psychosis and dementia. Patient was involuntarily committed to the hospital,  1-1 sitter, patient was seen by psychiatry and her medications were adjusted. Patient was placed on Cogentin 0.5 mg twice daily, Paliperidone was increased to 9 mg daily. Patient was continued on Wellbutrin, temazepam and trazodone. Patient responded well, involuntary commitment was suspended, patient was deemed stable to be discharged home.  3. Suspected metastatic ovarian cancer. Patient seen by GYN oncology, patient will need further workup with omentum biopsy to confirm diagnosis, outpatient follow-up will be arranged for next week.   4. Hypertension. Patient will continue amlodipine and lisinopril. Blood pressure remained well-controlled.  5. Type 2 diabetes mellitus. Patient will continue metformin. Diabetic diet.     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[facility])   Schizoaffective disorder, chronic condition with acute exacerbation ([place])   Acute encephalopathy   Ovarian mass   Hypertension, uncontrolled   Diabetes mellitus type 2 in nonobese ([place])   Ovarian cancer ([place])    Discharge Instructions   Allergies as of 04/16/2017      Reactions   Hydrocodone Itching      Medication List    STOP taking these medications   amantadine 100 MG capsule Commonly known as:  SYMMETREL   oxyCODONE 5 MG immediate release tablet Commonly known as:  Oxy IR/ROXICODONE  TAKE these medications   amLODipine 5 MG  tablet Commonly known as:  NORVASC Take 1 tablet (5 mg total) by mouth daily.   benztropine 0.5 MG tablet Commonly known as:  COGENTIN Take 1 tablet (0.5 mg total) by mouth 2 (two) times daily.   buPROPion 150 MG 24 hr tablet Commonly known as:  WELLBUTRIN XL Take 150 mg by mouth daily.   INVEGA SUSTENNA 156 MG/ML Susp injection Generic drug:  paliperidone Inject 1 mL (156 mg total) into the muscle every 30 (thirty) days.   lactulose 10 GM/15ML solution Commonly known as:  CHRONULAC Take 10 g by mouth daily.   linaclotide 290 MCG Caps capsule Commonly known as:  LINZESS Take 1 capsule (290 mcg total) by mouth daily. What changed:  how much to take   lisinopril 10 MG tablet Commonly known as:  PRINIVIL,ZESTRIL Take 20 mg by mouth daily.   memantine 10 MG tablet Commonly known as:  NAMENDA Take 5 mg by mouth 2 (two) times daily.   metFORMIN 500 MG tablet Commonly known as:  GLUCOPHAGE Take 500 mg by mouth daily with breakfast.   multivitamin with minerals Tabs tablet Take 1 tablet by mouth daily.   paliperidone 9 MG 24 hr tablet Commonly known as:  INVEGA Take 1 tablet (9 mg total) by mouth every morning. What changed:    medication strength  how much to take  when to take this   temazepam 7.5 MG capsule Commonly known as:  RESTORIL Take 1 capsule (7.5 mg total) by mouth at bedtime.   thiamine 100 MG tablet Take 1 tablet (100 mg total) by mouth daily.   traZODone 50 MG tablet Commonly known as:  DESYREL Take 50 mg by mouth at bedtime.   VITAMIN B 12 PO Take by mouth.   VITAMIN C PO Take by mouth.      Follow-up Information    Schedule an appointment as soon as possible for a visit  with Everitt Amber, MD.   Specialty:  Obstetrics and Gynecology Contact information: [place] [address] [phone]          Allergies  Allergen Reactions  . Hydrocodone Itching    Consultations:  Psychiatry  GYN Oncology     Procedures/Studies: Dg Chest 2 View  Result Date: 04/14/2017 CLINICAL DATA:  Hallucinations. History of diabetes, hypertension, current smoker. EXAM: CHEST - 2 VIEW COMPARISON:  Chest x-ray of April 11, 2011 FINDINGS: The lungs are adequately inflated. The interstitial markings remain coarse. There is a trace of blunting of the costophrenic angles bilaterally. The heart and pulmonary vascularity are normal. The mediastinum is normal in width. The trachea is midline. The bony thorax exhibits no acute abnormality. IMPRESSION: Minimal blunting of the costophrenic angles is new. No alveolar pneumonia nor pulmonary edema. Stable chronic interstitial prominence likely reflects the patient's smoking history. Electronically Signed   By: David  Martinique M.D.   On: 04/14/2017 14:26   Ct Abdomen Pelvis W Contrast  Result Date: 04/13/2017 CLINICAL DATA:  67 year old female with palpable pelvic mass, chronic large cystic ovarian neoplasm. EXAM: CT ABDOMEN AND PELVIS WITH CONTRAST TECHNIQUE: Multidetector CT imaging of the abdomen and pelvis was performed using the standard protocol following bolus administration of intravenous contrast. CONTRAST:  118mL ISOVUE-300 IOPAMIDOL (ISOVUE-300) INJECTION 61% COMPARISON:  CT Abdomen and Pelvis 09/19/2016 and earlier. FINDINGS: Lower chest: No lower lobe lung nodule or mass identified, but trace bilateral layering pleural fluid is new since 2018. No pericardial effusion.  Hepatobiliary: Chronic cholelithiasis. No pericholecystic inflammation. No biliary ductal enlargement. Stable liver parenchyma with anterior left lobe hemangioma and central benign hepatic cyst (the latter on series 2, image 16) unchanged. Pancreas: Negative. Spleen: Negative. Adrenals/Urinary Tract: Normal adrenal glands. Bilateral kidneys are stable. Bilateral renal enhancement and contrast excretion is symmetric and within normal limits. 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 It is unclear  how distally the ureter duplication persists. The urinary bladder is moderately distended and unremarkable. Stomach/Bowel: Definitive omental metastatic disease is demonstrated today, including nodular enhancing oval foci measuring 10 x 30 millimeters at the root of the greater omentum anterior to the liver (series 2 image 12 in 15), and additional scattered pathologic omental nodularity throughout the bilateral abdomen (series 2, images 37 and 38). Small volume associated ascites. No bowel obstruction. Moderate retained stool in redundant large bowel. No dilated small bowel. No free air. Mildly to moderately distended stomach with fluid and food debris. Decompressed duodenum and proximal small bowel. Vascular/Lymphatic: Occluded left common iliac artery (series 2, image 49) with reconstituted enhancement in the left external and internal iliac. Advanced Aortoiliac calcified atherosclerosis. The other major arterial structures in the abdomen and pelvis remain patent. Portal venous system is patent. No discrete lymphadenopathy in the abdomen or pelvis. Reproductive: Mild additional enlargement of the chronic large predominantly cystic pelvic mass thought to arise from the right ovary. Today this is 11.4 x 14.1 x 14.5 centimeters (AP by transverse by CC) versus 10.4 x 13.4 by 13.4 centimeters previously. There is also been enlargement of the heterogeneous and indistinct left ovary which now measures up to 5 centimeters diameter (previously 4.3 centimeters). The uterus appears diminutive. There are small calcified uterine fibroids suspected. Other: Trace pelvic fluid similar to 2018. Musculoskeletal: No acute or suspicious osseous lesion identified. IMPRESSION: 1. 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 Small volume ascites. Mildly enlarged and heterogeneous left ovary (up to 5 cm now). 2. New trace pleural  effusions are indeterminate. No lung base nodule or mass is identified. 3. 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 Advanced Aortic Atherosclerosis (ICD10-I70.0). 4. Retained stool in redundant colon, but no bowel obstruction. Electronically Signed   By: Genevie Ann M.D.   On: 04/13/2017 19:47       Subjective: Patient is feeling better, no confusion or agitation, no nausea or vomiting, no abdominal pain.     Discharge Exam: Vitals:   04/15/17 1703 04/16/17 0500  BP: 102/76 106/76  Pulse: 91 99  Resp: 18 13  Temp: 97.7 F (36.5 C) 98.3 F (36.8 C)  SpO2: 98% 98%   Vitals:   04/15/17 1030 04/15/17 1651 04/15/17 1703 04/16/17 0500  BP: 122/72 (!) 144/74 102/76 106/76  Pulse: 82 85 91 99  Resp:  20 18 13   Temp:   97.7 F (36.5 C) 98.3 F (36.8 C)  TempSrc:   Oral Oral  SpO2:  99% 98% 98%    General: Not in pain or dyspnea  Neurology: Awake and alert, non focal  E ENT: no pallor, no icterus, oral mucosa moist Cardiovascular: No JVD. S1-S2 present, rhythmic, no gallops, rubs, or murmurs. No lower extremity edema. Pulmonary: vesicular breath sounds bilaterally, adequate air movement, no wheezing, rhonchi or rales. Gastrointestinal. Abdomen with no organomegaly, non tender, no rebound or guarding Skin. No rashes Musculoskeletal: no joint deformities   The results  of significant diagnostics from this hospitalization (including imaging, microbiology, ancillary and laboratory) are listed below for reference.     Microbiology: No results found for this or any previous visit (from the past 240 hour(s)).   Labs: BNP (last 3 results) No results for input(s): BNP in the last 8760 hours. Basic Metabolic Panel: Recent Labs  Lab 04/13/17 1647  NA 138  K 4.1  CL 103  CO2 27  GLUCOSE 171*  BUN 12  CREATININE 0.79  CALCIUM 9.6   Liver Function Tests: Recent Labs  Lab 04/13/17 1647  AST 25   ALT 22  ALKPHOS 85  BILITOT 0.3  PROT 7.6  ALBUMIN 4.2   No results for input(s): LIPASE, AMYLASE in the last 168 hours. No results for input(s): AMMONIA in the last 168 hours. CBC: Recent Labs  Lab 04/13/17 1647  WBC 6.4  NEUTROABS 3.5  HGB 13.5  HCT 40.2  MCV 87.4  PLT 309   Cardiac Enzymes: No results for input(s): CKTOTAL, CKMB, CKMBINDEX, TROPONINI in the last 168 hours. BNP: Invalid input(s): POCBNP CBG: Recent Labs  Lab 04/16/17 1129  GLUCAP 112*   D-Dimer No results for input(s): DDIMER in the last 72 hours. Hgb A1c No results for input(s): HGBA1C in the last 72 hours. Lipid Profile No results for input(s): CHOL, HDL, LDLCALC, TRIG, CHOLHDL, LDLDIRECT in the last 72 hours. Thyroid function studies No results for input(s): TSH, T4TOTAL, T3FREE, THYROIDAB in the last 72 hours.  Invalid input(s): FREET3 Anemia work up No results for input(s): VITAMINB12, FOLATE, FERRITIN, TIBC, IRON, RETICCTPCT in the last 72 hours. Urinalysis    Component Value Date/Time   COLORURINE YELLOW 04/14/2017 1503   APPEARANCEUR CLEAR 04/14/2017 1503   LABSPEC 1.015 04/14/2017 1503   PHURINE 7.0 04/14/2017 1503   GLUCOSEU >=500 (A) 04/14/2017 1503   HGBUR NEGATIVE 04/14/2017 1503   BILIRUBINUR NEGATIVE 04/14/2017 1503   KETONESUR NEGATIVE 04/14/2017 1503   PROTEINUR NEGATIVE 04/14/2017 1503   UROBILINOGEN 0.2 10/23/2014 1102   NITRITE NEGATIVE 04/14/2017 1503   LEUKOCYTESUR NEGATIVE 04/14/2017 1503   Sepsis Labs Invalid input(s): PROCALCITONIN,  WBC,  LACTICIDVEN Microbiology No results found for this or any previous visit (from the past 240 hour(s)).   Time coordinating discharge: 45 minutes  SIGNED:   Tawni Millers, MD  [facility] 04/16/2017, 1:17 PM Pager [phone]  If 7PM-7AM, please contact night-coverage [email or web address] Password [email or web address]

## 2017-04-17 ENCOUNTER — Inpatient Hospital Stay (HOSPITAL_COMMUNITY): Payer: Medicare Other

## 2017-04-17 ENCOUNTER — Encounter (HOSPITAL_COMMUNITY): Payer: Self-pay | Admitting: Radiology

## 2017-04-17 DIAGNOSIS — F203 Undifferentiated schizophrenia: Principal | ICD-10-CM

## 2017-04-17 DIAGNOSIS — R05 Cough: Secondary | ICD-10-CM | POA: Diagnosis not present

## 2017-04-17 MED ORDER — SODIUM CHLORIDE 0.9 % IV SOLN
INTRAVENOUS | Status: AC
  Filled 2017-04-17: qty 250

## 2017-04-17 MED ORDER — LIDOCAINE HCL (PF) 1 % IJ SOLN
INTRAMUSCULAR | Status: AC | PRN
  Administered 2017-04-17: 10 mL

## 2017-04-17 MED ORDER — FENTANYL CITRATE (PF) 100 MCG/2ML IJ SOLN
INTRAMUSCULAR | Status: AC | PRN
  Administered 2017-04-17: 50 ug via INTRAVENOUS

## 2017-04-17 MED ORDER — MIDAZOLAM HCL 2 MG/2ML IJ SOLN
INTRAMUSCULAR | Status: AC | PRN
  Administered 2017-04-17: 1 mg via INTRAVENOUS

## 2017-04-17 MED ORDER — MIDAZOLAM HCL 2 MG/2ML IJ SOLN
INTRAMUSCULAR | Status: AC
  Filled 2017-04-17: qty 2

## 2017-04-17 MED ORDER — FENTANYL CITRATE (PF) 100 MCG/2ML IJ SOLN
INTRAMUSCULAR | Status: AC
  Filled 2017-04-17: qty 2

## 2017-04-17 NOTE — Progress Notes (Signed)
CT notified to call daughter prior to sedation.  Note also placed on front of chart.

## 2017-04-17 NOTE — Care Management Important Message (Signed)
Important Message  Patient Details  Name: AALIYAH GAVEL MRN: [id number] Date of Birth: Aug 23, 1950   Medicare Important Message Given:  Yes    Kerin Salen 04/17/2017, 10:12 AMImportant Message  Patient Details  Name: LOLETHA BERTINI MRN: [id number] Date of Birth: 1950-11-21   Medicare Important Message Given:  Yes    Kerin Salen 04/17/2017, 10:12 AM

## 2017-04-17 NOTE — Progress Notes (Signed)
Spoke with daughter, [name], via telephone. [name] stated that she will pick her mother up at 10 pm that she does not want her to take a taxi home.

## 2017-04-17 NOTE — Procedures (Signed)
Omental Bx 18 g times three EBL 0 Comp 0

## 2017-04-17 NOTE — Discharge Summary (Signed)
Physician Discharge Summary  LING FLESCH UUV:[id number] DOB: [birth year] DOA: 04/13/2017  PCP: Everardo Beals, NP  Admit date: 04/13/2017 Discharge date: 04/17/2017  Admitted From: Home Disposition:  Home   Recommendations for Outpatient Follow-up:  1. Follow up with PCP in 1 week 2. Follow up with [name], NP or Dr. Denman George next week  3. Please follow up on the following pending results: omental biopsy results   Discharge Condition: Stable, improved CODE STATUS: Full  Diet recommendation: Carb modified diet   Brief/Interim Summary: Kristen Colon is a 67 year old female who presented with delusions. She does have the significant past medical history of schizophrenia, hypertension, schizoaffective disorder and recent diagnosis of ovarian cancer. Patient was brought by her daughter due to decreased p.o. intake, lack of sleep, delusions and erratic behavior.Patient was involuntarily committed dueto the hospital due to patient'ssafety.On the initial physical examination blood pressure 136/87, heart rate 85, respiratory 18, temperature 96.7, oxygen saturation 97%, moist mucous membranes, lungs clear to auscultation, heart S1-S2 present rhythmic, abdomen soft and nontender, no lower extremity edema.Sodium 138, potassium 4.1, chloride 103, bicarb 27, glucose 171, BUN 12, creatinine 0.79, white count 6.4, hemoglobin 13.5, hematocrit 40.2, platelets 390.Urinalysis negative for infection, greater than 500 glucose. 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 EKG normal sinus rhythm, normal axis, normal intervals.  Patient was evaluated by psychiatry.  Medications were adjusted with improvement in  mentation and stabilization.  IVC was retracted.  Patient was ready for discharge on 3/28.  However after daughter's conversation with gyn oncology, her outpatient omental biopsy was scheduled while patient was inpatient, at daughter's request.  IR was consulted and patient successfully underwent omental biopsy 3/29.   Discharge Diagnoses:  Principal Problem:   Schizophrenia, undifferentiated ([place]) Active Problems:   Liver mass   Delusional disorder (HCC)   Schizoaffective disorder, chronic condition with acute exacerbation ([place])   Acute encephalopathy   Ovarian mass   Hypertension, uncontrolled   Diabetes mellitus type 2 in nonobese ([place])   Ovarian cancer ([place])   Acute metabolic encephalopathy with schizoaffective disorder with psychosis and dementia Patient was involuntarily committed to the hospital with 1-1 sitter. Patient was seen by psychiatry and her medications were adjusted as below. Patient responded well, involuntary commitment was suspended, patient was deemed stable to be discharged home. Medications per Psych: -Continue Wellbutrin 150 mg daily for depression. -Continue Invega 9 mg daily for psychosis. Patient receives monthly Invega 156 mg injection. -Recommend starting Cogentin 0.5 mg BID for EPS. -Continue Restoril 7.5 mg qhs and Trazodone 50 mg qhs for insomnia.  -Continue Namenda 5 mg BID for dementia.   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 They have recommended further work up with omental biopsy. This was originally supposed to be arranged as outpatient but daughter has requested it be done as inpatient as daughter would have a difficult time getting patient back to the hospital. IR consulted and underwent biopsy 3/29.   Hypertension Continue amlodipine and lisinopril. Blood pressure remained well-controlled.  Type 2 diabetes mellitus Continue metformin. Diabetic diet.     Discharge  Instructions  Discharge Instructions    Call MD for:  difficulty breathing, headache or visual disturbances   Complete by:  As directed  Call MD for:  extreme fatigue   Complete by:  As directed    Call MD for:  hives   Complete by:  As directed    Call MD for:  persistant dizziness or light-headedness   Complete by:  As directed    Call MD for:  persistant nausea and vomiting   Complete by:  As directed    Call MD for:  redness, tenderness, or signs of infection (pain, swelling, redness, odor or green/yellow discharge around incision site)   Complete by:  As directed    Call MD for:  severe uncontrolled pain   Complete by:  As directed    Call MD for:  temperature >100.4   Complete by:  As directed    Diet Carb Modified   Complete by:  As directed    Discharge instructions   Complete by:  As directed    Please follow up with primary care in 7 days.   Discharge instructions   Complete by:  As directed    You were cared for by a hospitalist during your hospital stay. If you have any questions about your discharge medications or the care you received while you were in the hospital after you are discharged, you can call the unit and asked to speak with the hospitalist on call if the hospitalist that took care of you is not available. Once you are discharged, your primary care physician will handle any further medical issues. Please note that NO REFILLS for any discharge medications will be authorized once you are discharged, as it is imperative that you return to your primary care physician (or establish a relationship with a primary care physician if you do not have one) for your aftercare needs so that they can reassess your need for medications and monitor your lab values.   Increase activity slowly   Complete by:  As directed      Allergies as of 04/17/2017      Reactions   Hydrocodone Itching      Medication List    STOP taking these medications   amantadine 100 MG  capsule Commonly known as:  SYMMETREL   oxyCODONE 5 MG immediate release tablet Commonly known as:  Oxy IR/ROXICODONE     TAKE these medications   amLODipine 5 MG tablet Commonly known as:  NORVASC Take 1 tablet (5 mg total) by mouth daily.   benztropine 0.5 MG tablet Commonly known as:  COGENTIN Take 1 tablet (0.5 mg total) by mouth 2 (two) times daily.   buPROPion 150 MG 24 hr tablet Commonly known as:  WELLBUTRIN XL Take 150 mg by mouth daily.   INVEGA SUSTENNA 156 MG/ML Susp injection Generic drug:  paliperidone Inject 1 mL (156 mg total) into the muscle every 30 (thirty) days.   lactulose 10 GM/15ML solution Commonly known as:  CHRONULAC Take 10 g by mouth daily.   linaclotide 290 MCG Caps capsule Commonly known as:  LINZESS Take 1 capsule (290 mcg total) by mouth daily. What changed:  how much to take   lisinopril 10 MG tablet Commonly known as:  PRINIVIL,ZESTRIL Take 20 mg by mouth daily.   memantine 10 MG tablet Commonly known as:  NAMENDA Take 5 mg by mouth 2 (two) times daily.   metFORMIN 500 MG tablet Commonly known as:  GLUCOPHAGE Take 500 mg by mouth daily with breakfast.   multivitamin with minerals Tabs tablet Take 1 tablet by mouth daily.   paliperidone 9 MG 24 hr  tablet Commonly known as:  INVEGA Take 1 tablet (9 mg total) by mouth every morning. What changed:    medication strength  how much to take  when to take this   temazepam 7.5 MG capsule Commonly known as:  RESTORIL Take 1 capsule (7.5 mg total) by mouth at bedtime.   thiamine 100 MG tablet Take 1 tablet (100 mg total) by mouth daily.   traZODone 50 MG tablet Commonly known as:  DESYREL Take 50 mg by mouth at bedtime.   VITAMIN B 12 PO Take by mouth.   VITAMIN C PO Take by mouth.      Follow-up Information    Everitt Amber, MD. Schedule an appointment as soon as possible for a visit.   Specialty:  Obstetrics and Gynecology Contact information: [place] [address] [phone]        Everardo Beals, NP Follow up in 1 week(s).   Contact information: [facility] [address] [place] [address] [phone]          Allergies  Allergen Reactions  . Hydrocodone Itching    Consultations:  Psych   Gyn Onc  IR    Procedures/Studies: Dg Chest 2 View  Result Date: 04/14/2017 CLINICAL DATA:  Hallucinations. History of diabetes, hypertension, current smoker. EXAM: CHEST - 2 VIEW COMPARISON:  Chest x-ray of April 11, 2011 FINDINGS: The lungs are adequately inflated. The interstitial markings remain coarse. There is a trace of blunting of the costophrenic angles bilaterally. The heart and pulmonary vascularity are normal. The mediastinum is normal in width. The trachea is midline. The bony thorax exhibits no acute abnormality. IMPRESSION: Minimal blunting of the costophrenic angles is new. No alveolar pneumonia nor pulmonary edema. Stable chronic interstitial prominence likely reflects the patient's smoking history. Electronically Signed   By: David  Martinique M.D.   On: 04/14/2017 14:26   Ct Abdomen Pelvis W Contrast  Result Date: 04/13/2017 CLINICAL DATA:  67 year old female with palpable pelvic mass, chronic large cystic ovarian neoplasm. EXAM: CT ABDOMEN AND PELVIS WITH CONTRAST TECHNIQUE: Multidetector CT imaging of the abdomen and pelvis was performed using the standard protocol following bolus administration of intravenous contrast. CONTRAST:  153mL ISOVUE-300 IOPAMIDOL (ISOVUE-300) INJECTION 61% COMPARISON:  CT Abdomen and Pelvis 09/19/2016 and earlier. FINDINGS: Lower chest: No lower lobe lung nodule or mass identified, but trace bilateral layering pleural fluid is new since 2018. No pericardial effusion. Hepatobiliary: Chronic cholelithiasis. No pericholecystic inflammation. No biliary ductal enlargement. Stable liver parenchyma with anterior left lobe hemangioma and central benign hepatic  cyst (the latter on series 2, image 16) unchanged. Pancreas: Negative. Spleen: Negative. Adrenals/Urinary Tract: Normal adrenal glands. Bilateral kidneys are stable. Bilateral renal enhancement and contrast excretion is symmetric and within normal limits. 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 It is unclear how distally the ureter duplication persists. The urinary bladder is moderately distended and unremarkable. Stomach/Bowel: Definitive omental metastatic disease is demonstrated today, including nodular enhancing oval foci measuring 10 x 30 millimeters at the root of the greater omentum anterior to the liver (series 2 image 12 in 15), and additional scattered pathologic omental nodularity throughout the bilateral abdomen (series 2, images 37 and 38). Small volume associated ascites. No bowel obstruction. Moderate retained stool in redundant large bowel. No dilated small bowel. No free air. Mildly to moderately distended stomach with fluid and food debris. Decompressed duodenum and proximal small bowel. Vascular/Lymphatic: Occluded left common iliac artery (series 2, image 49) with reconstituted  enhancement in the left external and internal iliac. Advanced Aortoiliac calcified atherosclerosis. The other major arterial structures in the abdomen and pelvis remain patent. Portal venous system is patent. No discrete lymphadenopathy in the abdomen or pelvis. Reproductive: Mild additional enlargement of the chronic large predominantly cystic pelvic mass thought to arise from the right ovary. Today this is 11.4 x 14.1 x 14.5 centimeters (AP by transverse by CC) versus 10.4 x 13.4 by 13.4 centimeters previously. There is also been enlargement of the heterogeneous and indistinct left ovary which now measures up to 5 centimeters diameter (previously 4.3 centimeters). The uterus appears diminutive. There are small calcified uterine fibroids suspected. Other: Trace pelvic fluid similar to 2018. Musculoskeletal:  No acute or suspicious osseous lesion identified. IMPRESSION: 1. 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 Small volume ascites. Mildly enlarged and heterogeneous left ovary (up to 5 cm now). 2. New trace pleural effusions are indeterminate. No lung base nodule or mass is identified. 3. 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 Advanced Aortic Atherosclerosis (ICD10-I70.0). 4. Retained stool in redundant colon, but no bowel obstruction. Electronically Signed   By: Genevie Ann M.D.   On: 04/13/2017 19:47   Ct Biopsy  Result Date: 04/17/2017 INDICATION: Omental mass EXAM: CT BIOPSY MEDICATIONS: None. ANESTHESIA/SEDATION: Fentanyl 50 mcg IV; Versed 1 mg IV Moderate Sedation Time:  12 minutes The patient was continuously monitored during the procedure by the interventional radiology nurse under my direct supervision. FLUOROSCOPY TIME:  Fluoroscopy Time:  minutes  seconds ( mGy). COMPLICATIONS: None immediate. PROCEDURE: Informed written consent was obtained from the patient after a thorough discussion of the procedural risks, benefits and alternatives. All questions were addressed. Maximal Sterile Barrier Technique was utilized including caps, mask, sterile gowns, sterile gloves, sterile drape, hand hygiene and skin antiseptic. A timeout was performed prior to the initiation of the procedure. Under CT guidance, a(n) 17 gauge guide needle was advanced into the epigastric omental mass. Subsequently 3 18 gauge core biopsies were obtained. The guide needle was removed. Post biopsy images demonstrate no hemorrhage. Patient tolerated the procedure well without complication. Vital sign monitoring by nursing staff during the procedure will continue as patient is in the special procedures unit for post procedure observation. FINDINGS: The images  document guide needle placement within the omental mass. Post biopsy images demonstrate no hemorrhage. IMPRESSION: Successful CT-guided core biopsy of an omental mass. Electronically Signed   By: Marybelle Killings M.D.   On: 04/17/2017 16:42      Discharge Exam: Vitals:   04/17/17 1615 04/17/17 1619  BP: 101/75 106/77  Pulse: 80 75  Resp: 11 10  Temp:    SpO2: 90% 96%    General: Pt is alert, awake, not in acute distress Cardiovascular: RRR, S1/S2 +, no rubs, no gallops Respiratory: CTA bilaterally, no wheezing, no rhonchi Abdominal: Soft, NT, bowel sounds +, +mildly distended  Extremities: no edema, no cyanosis    The results of significant diagnostics from this hospitalization (including imaging, microbiology, ancillary and laboratory) are listed below for reference.     Microbiology: No results found for this or any previous visit (from the past 240 hour(s)).   Labs: BNP (last 3 results) No results for input(s): BNP in the last 8760 hours. Basic Metabolic Panel: Recent Labs  Lab 04/13/17 1647  NA 138  K 4.1  CL 103  CO2 27  GLUCOSE 171*  BUN 12  CREATININE 0.79  CALCIUM 9.6   Liver Function Tests: Recent Labs  Lab 04/13/17 1647  AST 25  ALT 22  ALKPHOS 85  BILITOT 0.3  PROT 7.6  ALBUMIN 4.2   No results for input(s): LIPASE, AMYLASE in the last 168 hours. No results for input(s): AMMONIA in the last 168 hours. CBC: Recent Labs  Lab 04/13/17 1647  WBC 6.4  NEUTROABS 3.5  HGB 13.5  HCT 40.2  MCV 87.4  PLT 309   Cardiac Enzymes: No results for input(s): CKTOTAL, CKMB, CKMBINDEX, TROPONINI in the last 168 hours. BNP: Invalid input(s): POCBNP CBG: Recent Labs  Lab 04/16/17 1129  GLUCAP 112*   D-Dimer No results for input(s): DDIMER in the last 72 hours. Hgb A1c No results for input(s): HGBA1C in the last 72 hours. Lipid Profile No results for input(s): CHOL, HDL, LDLCALC, TRIG, CHOLHDL, LDLDIRECT in the last 72 hours. Thyroid function  studies No results for input(s): TSH, T4TOTAL, T3FREE, THYROIDAB in the last 72 hours.  Invalid input(s): FREET3 Anemia work up No results for input(s): VITAMINB12, FOLATE, FERRITIN, TIBC, IRON, RETICCTPCT in the last 72 hours. Urinalysis    Component Value Date/Time   COLORURINE YELLOW 04/14/2017 1503   APPEARANCEUR CLEAR 04/14/2017 1503   LABSPEC 1.015 04/14/2017 1503   PHURINE 7.0 04/14/2017 1503   GLUCOSEU >=500 (A) 04/14/2017 1503   HGBUR NEGATIVE 04/14/2017 1503   BILIRUBINUR NEGATIVE 04/14/2017 1503   KETONESUR NEGATIVE 04/14/2017 1503   PROTEINUR NEGATIVE 04/14/2017 1503   UROBILINOGEN 0.2 10/23/2014 1102   NITRITE NEGATIVE 04/14/2017 1503   LEUKOCYTESUR NEGATIVE 04/14/2017 1503   Sepsis Labs Invalid input(s): PROCALCITONIN,  WBC,  LACTICIDVEN Microbiology No results found for this or any previous visit (from the past 240 hour(s)).   Patient was seen and examined on the day of discharge and was found to be in stable condition. Time coordinating discharge: 35 minutes including assessment and coordination of care, as well as examination of the patient.   SIGNED:  Dessa Phi, DO [facility] Pager [phone]  If 7PM-7AM, please contact night-coverage [email or web address] Password [facility] 04/17/2017, 4:54 PM

## 2017-04-17 NOTE — Progress Notes (Signed)
Daughter [name] called, asked if mother can now be brought home via ambulance. States she is sick and unable to have someone bring her here to pick up mother. Daughter does not want mother to take taxi home. Explained we can set up transportation via non-emergency ambulance. Also explained we do not know time mother will be picked up. Daughter understands and is ok with this.  [facility] called and transportation set up; ETA could not be given.

## 2017-04-17 NOTE — Progress Notes (Signed)
Spoke with pt's daughter concerning discharge plan. Daughter states she have the flu and will not be able to pick pt up. Daughter continued to say the [facility] will bring her home but she will need some clothes. Daughter also asked for a letter to clear the pt to go back to [place].

## 2017-04-17 NOTE — Progress Notes (Addendum)
Consult request has been received. CSW attempting to follow up at present time.  Per notes pt's daughter had stated, "Spoke with pt's daughter concerning discharge plan. Daughter states she have the flu and will not be able to pick pt up. Daughter continued to say the [facility] will bring her home but she will need some clothes. Daughter also asked for a letter to clear the pt to go back to [place]".   CSW staffed pt's case with social work and learned the [place] is a day program and that the pt lives with her daughter.  CSW called the pt's daughter Kristen Colon at ph: [phone] to let her know that the Sheriff's Dept cannot transport the pt that the Sheriff's Dept only transports pt's from facility to facility and that the pt's daughter will have to arrange transportation for the pt or pick the pt up.  Pt's daughter refused to pick the pt up stating, "I'm not going to be picking her up I have the flu".  CSW replied, "Ma'am, we can't provide a taxi as the pt can ambulate and the Sheriff's Dept won't transport to pt's homes, as I said, so you will have to pick your mother up.  Pt's daughter stated, "You are not hearing me, I just said I have the flu, I'm not picking her up right, what is wrong with you and she can't be discharged when she is still sedated so I'm not picking her up!"  CSW replied, "Ma'am I'm sorry your not feeling well, but the doctor make those decisions and the doctor has D/C'd her and she has to be picked up"  Pt's daughter raised her voice, "I just told you I have the flu and I'm not picking her up!"  CSW replied, "Ma'am, she can wait in the waiting room until you can get here if absolutely necessary until you pick her up, but the pt is D/C'd she can't just stay here, she has to be picked up, she is discharged".  Pt's daughter began to yell at the [place] in a loud voice, "What did I just tell you, I have the flu and I'm not feeling well and I'm not  picking her up!" in a very loud voice and CSW stated "Ma'am the MD stated pt is safe to go and is D/C'd and if you refuse to pick your mother up I will have to call Adult Protective Services and report your mother is D/C'd, that you refuse to pick her up and report abandonment"  and continued,  "your mother is D/C'd and cannot stay here.  Pt's daughter began to scream at the social worker and reversed herself and yelled, "You can't talk to me like that you are a bad social worker you can't even be a Education officer, museum and talk like that and she is still sedated and you can't let her go until she is not sedated anymore".    CSW replied, "Ma'am I'm just advocating for the pt's welfare so she is not left at the hospital without transportation home" and daughter replied, "Shut up, shut up, don't you even say anything you just listen to me, you don't tak, don't say a word, you are a bad Education officer, museum and I'm going to report you and who is in charge and you don't talk to me you just listen!"    Pt's daughter continued to yell at the CSW for some time and when she stopped the pt's daughter stated, "I didn't  say I wouldn't pick her up I said I can't pick her up until 10:30 because I have the flu and have to get some things together".  CSW replied, Ma'am, "the pt cannot remain in her room until 10:30, she is D/C'd she is ready to leave and pt's daughter said, "Just shut up, just shut up I'm reporting you who is in charge who is in charge, I want to talk to someone and CSW attempted to ask if she would like to speak to the [facility] and pt stated, "Just shut up, you don't talk, you don't talk I talk and continued in a raised voice repeating herself until she stopped and said, "I'm picking her up I just can't until later" and CSW replied, "Ma'am I'm glad to hear you are picking her up, you said earlier you would not pick her up, when pt's famililes refuse to pick pt's up, I am obligated as a Education officer, museum to explain the procedure  we follow when pt's are left here without being picked up, would you like to speak to the [facility] in charge? Pt's daughter yelled, "What is her number?!" and CSW stated she would be glad to have the [facility] call her, that he can't give out the A.C's cell phone number but would have the [facility] call her.  CSW went to the AC's door but the [facility] was on rounds so CSW called the [facility] and the [facility] had already talked to the pt's daughter and that pt's daughter was now saying she would pick up the pt after 10:30pm and that pt can remain in her room until the pt's daughter arrived after 10pm.  Please reconsult if future social work needs arise.  CSW signing off, as social work intervention is no longer needed.  Alphonse Guild. Demtrius Rounds, LCSW, LCAS, CSI Clinical Social Worker Ph: [phone]

## 2017-04-17 NOTE — Progress Notes (Signed)
Pt discharged home in stable condition.  Transported via [facility]. Daughter called and informed pt is on the way home.  AVS and teaching explained via phone.

## 2017-04-22 ENCOUNTER — Ambulatory Visit: Payer: Self-pay | Admitting: Gynecologic Oncology

## 2017-04-30 ENCOUNTER — Ambulatory Visit: Payer: Self-pay | Admitting: Gynecologic Oncology

## 2017-05-04 ENCOUNTER — Telehealth: Payer: Self-pay | Admitting: *Deleted

## 2017-05-04 NOTE — Telephone Encounter (Signed)
Attempted to contact the daughter, left a message for her to call the office back to reschedule her mothers appt

## 2017-05-05 ENCOUNTER — Telehealth: Payer: Self-pay | Admitting: *Deleted

## 2017-05-05 NOTE — Telephone Encounter (Signed)
Attempted to return the patient's daughter call. Left a  message to call the office back.

## 2017-05-12 ENCOUNTER — Ambulatory Visit: Payer: Self-pay | Admitting: Gynecologic Oncology

## 2017-05-12 ENCOUNTER — Encounter: Payer: Self-pay | Admitting: Gynecologic Oncology

## 2017-05-12 ENCOUNTER — Inpatient Hospital Stay: Payer: Medicare Other | Attending: Gynecologic Oncology | Admitting: Gynecologic Oncology

## 2017-05-12 VITALS — BP 134/80 | HR 89 | Temp 97.7°F | Resp 20 | Ht 65.0 in | Wt 125.6 lb

## 2017-05-12 DIAGNOSIS — F0390 Unspecified dementia without behavioral disturbance: Secondary | ICD-10-CM | POA: Diagnosis not present

## 2017-05-12 DIAGNOSIS — D3911 Neoplasm of uncertain behavior of right ovary: Secondary | ICD-10-CM | POA: Insufficient documentation

## 2017-05-12 DIAGNOSIS — C801 Malignant (primary) neoplasm, unspecified: Secondary | ICD-10-CM | POA: Insufficient documentation

## 2017-05-12 DIAGNOSIS — R971 Elevated cancer antigen 125 [CA 125]: Secondary | ICD-10-CM | POA: Diagnosis not present

## 2017-05-12 DIAGNOSIS — R180 Malignant ascites: Secondary | ICD-10-CM | POA: Diagnosis not present

## 2017-05-12 DIAGNOSIS — C786 Secondary malignant neoplasm of retroperitoneum and peritoneum: Secondary | ICD-10-CM | POA: Diagnosis present

## 2017-05-12 DIAGNOSIS — N838 Other noninflammatory disorders of ovary, fallopian tube and broad ligament: Secondary | ICD-10-CM

## 2017-05-12 MED ORDER — BLOOD PRESSURE CUFF MISC: 1.0000 [IU] | Freq: Once | 0 refills | Status: AC

## 2017-05-12 NOTE — Patient Instructions (Signed)
Preparing for your Surgery  Plan for surgery on Jun 02, 2017 with Dr. Everitt Amber at [place] [facility] will be scheduled for a diagnostic laparoscopy, possible omental biopsy.  Pre-operative Testing -You will receive a phone call from presurgical testing at [facility] to arrange for a pre-operative testing appointment before your surgery.  This appointment normally occurs one to two weeks before your scheduled surgery.   -Bring your insurance card, copy of an advanced directive if applicable, medication list  -At that visit, you will be asked to sign a consent for a possible blood transfusion in case a transfusion becomes necessary during surgery.  The need for a blood transfusion is rare but having consent is a necessary part of your care.    -You should not be taking blood thinners or aspirin at least ten days prior to surgery unless instructed by your surgeon.  Day Before Surgery at [place] will be asked to take in a light diet the day before surgery.  Avoid carbonated beverages.  You will be advised to have nothing to eat or drink after midnight the evening before.    Eat a light diet the day before surgery.  Examples including soups, broths, toast, yogurt, mashed potatoes.  Things to avoid include carbonated beverages (fizzy beverages), raw fruits and raw vegetables, or beans.   If your bowels are filled with gas, your surgeon will have difficulty visualizing your pelvic organs which increases your surgical risks.  Your role in recovery Your role is to become active as soon as directed by your doctor, while still giving yourself time to heal.  Rest when you feel tired. You will be asked to do the following in order to speed your recovery:  - Cough and breathe deeply. This helps toclear and expand your lungs and can prevent pneumonia. You may be given a spirometer to practice deep breathing. A staff member will show you how to use the spirometer. - Do  mild physical activity. Walking or moving your legs help your circulation and body functions return to normal. A staff member will help you when you try to walk and will provide you with simple exercises. Do not try to get up or walk alone the first time. - Actively manage your pain. Managing your pain lets you move in comfort. We will ask you to rate your pain on a scale of zero to 10. It is your responsibility to tell your doctor or nurse where and how much you hurt so your pain can be treated.  Special Considerations -If you are diabetic, you may be placed on insulin after surgery to have closer control over your blood sugars to promote healing and recovery.  This does not mean that you will be discharged on insulin.  If applicable, your oral antidiabetics will be resumed when you are tolerating a solid diet.  -Your final pathology results from surgery should be available by the Friday after surgery and the results will be relayed to you when available.  -Dr. Lahoma Crocker is the Surgeon that assists your GYN Oncologist with surgery.  The next day after your surgery you will either see your GYN Oncologist or Dr. Lahoma Crocker.   Blood Transfusion Information WHAT IS A BLOOD TRANSFUSION? A transfusion is the replacement of blood or some of its parts. Blood is made up of multiple cells which provide different functions.  Red blood cells carry oxygen and are used for blood loss replacement.  White blood cells  fight against infection.  Platelets control bleeding.  Plasma helps clot blood.  Other blood products are available for specialized needs, such as hemophilia or other clotting disorders. BEFORE THE TRANSFUSION  Who gives blood for transfusions?   You may be able to donate blood to be used at a later date on yourself (autologous donation).  Relatives can be asked to donate blood. This is generally not any safer than if you have received blood from a stranger. The same  precautions are taken to ensure safety when a relative's blood is donated.  Healthy volunteers who are fully evaluated to make sure their blood is safe. This is blood bank blood. Transfusion therapy is the safest it has ever been in the practice of medicine. Before blood is taken from a donor, a complete history is taken to make sure that person has no history of diseases nor engages in risky social behavior (examples are intravenous drug use or sexual activity with multiple partners). The donor's travel history is screened to minimize risk of transmitting infections, such as malaria. The donated blood is tested for signs of infectious diseases, such as HIV and hepatitis. The blood is then tested to be sure it is compatible with you in order to minimize the chance of a transfusion reaction. If you or a relative donates blood, this is often done in anticipation of surgery and is not appropriate for emergency situations. It takes many days to process the donated blood. RISKS AND COMPLICATIONS Although transfusion therapy is very safe and saves many lives, the main dangers of transfusion include:   Getting an infectious disease.  Developing a transfusion reaction. This is an allergic reaction to something in the blood you were given. Every precaution is taken to prevent this. The decision to have a blood transfusion has been considered carefully by your caregiver before blood is given. Blood is not given unless the benefits outweigh the risks.

## 2017-05-12 NOTE — Progress Notes (Signed)
Follow-up Note: Gyn-Onc  Consult was initially requested by Dr. Tana Coast for the evaluation of Kristen Colon 67 y.o. female  CC:     Chief Complaint  Patient presents with  . Hypertension    Assessment/Plan:  Kristen Colon  is a 67 y.o.  year old with a 14cm mosty simple, fluid filled cyst that appears to be arising from the right ovary in addition to a solid appearing left 5cm ovarian mass, small volume ascites, omental nodularity and an elevated CA 125 tumor marker. 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 Percutaneous biopsy was negative. However I am concerned this was a false negative result.  The patient has profound dementia and paranoid schizophrenia. She is not able to make treatment decisions (her daughter is power of attorney) and is somewhat combative about treatment proposals.   I am recommending diagnostic laparoscopy with omental biopsy. I discussed this with her daughter and explained the risks of the procedure. I discussed that if metastatic ovarian cancer is diagnosed, it would require treatment with chemotherapy and I do not know if her mother would be able to tolerate/agree to such treatment, therefore, we may have to consider the option of hospice and palliative care.   Surgery is scheduled for 06/04/17  HPI: Kristen Colon is a 67 year old P17 who is seen in consultation at the request of Dr Tana Coast for  13cm right ovarian cyst.  The patient has schizophrenia, dementia (of unknown etiology), HTN and alcohol abuse. She had been hospitalized in July 2018 for inpatient detoxification. She was then discharged to home on antabuse. Her daughter reports that she discovered that the caregiver she had hired at home had actually provided her mother with alcohol while she was using antabuse.  She had been recently determined to have dementia, but had not yet had a full workup of the etiology.  She developed symptoms of altered mental  status, nausea and emesis on 09/18/16 and her PCP recommended she be sent to the hospital where she was admitted to [facility]. She was noted to be very hypertensive.  CT abdo/pelvis on 09/19/16 showed Interval increase in size of a large right adnexal cystic mass measuring 10.4 x 13.4 cm, previously 10 cm. Mass-effect with displacement of bowel loops toward the periphery. Enlarged left ovary with multiple low-density lesions. Uterus contains calcified fibroids. The cyst was seen on 04/24/14 on a CT abdo/pelvis.  The cyst is regular in appearance with a single septation but is mostly fluid filled with slight thickening on the inferior cortex. It appears benign.  Interval Hx:  On 04/13/17 she underwent a CT abd/pelvis during a psych admission (as they had seen my plan for follow-up of the mass with repeat imaging). It revealed:  Definitive omental metastatic disease and additional scattered pathologic omental nodularity throughout the bilateral abdomen. Small volume associated ascites. No bowel obstruction. Occluded left common iliac artery with reconstituted enhancement in the left external and internal iliac. Advanced Aortoiliac calcified atherosclerosis. The other major arterial structures in the abdomen and pelvis remain patent. No discrete lymphadenopathy in the abdomen or pelvis. Mild additional enlargement of the chronic large predominantly cystic pelvic mass thought to arise from the right ovary. Today this is 11.4 x 14.1 x 14.5 centimeters (AP by transverse by CC) versus 10.4 x 13.4 by 13.4 centimeters previously. There is also been enlargement of the heterogeneous and indistinct left ovary which now measures up to 5 centimeters diameter (previously 4.3 centimeters). The uterus  appears diminutive.   CA 125 on 04/13/17 was 513  Given these findings being concerning for metastatic ovarian cancer she underwent an IR percutaneous omental biopsy on 04/17/17 which revealed only benign smooth muscle and fatty  tissue.  The patient's dementia is progressive. She lives with her daughter. Her daughter has medical POA. She cannot make decisions for herself. She states she has no complaints but her daughter confirms abdominal distension and worsenting hyperglycemia.    Current Outpatient Medications:  .  amLODipine (NORVASC) 5 MG tablet, Take 1 tablet (5 mg total) by mouth daily., Disp: 30 tablet, Rfl: 3 .  Ascorbic Acid (VITAMIN C PO), Take by mouth., Disp: , Rfl:  .  benztropine (COGENTIN) 0.5 MG tablet, Take 1 tablet (0.5 mg total) by mouth 2 (two) times daily., Disp: 60 tablet, Rfl: 0 .  buPROPion (WELLBUTRIN XL) 150 MG 24 hr tablet, Take 150 mg by mouth daily., Disp: , Rfl:  .  Cyanocobalamin (VITAMIN B 12 PO), Take by mouth., Disp: , Rfl:  .  INVEGA SUSTENNA 156 MG/ML SUSP injection, Inject 1 mL (156 mg total) into the muscle every 30 (thirty) days., Disp: 0.9 mL, Rfl: 0 .  lactulose (CHRONULAC) 10 GM/15ML solution, Take 10 g by mouth daily., Disp: , Rfl: 3 .  linaclotide (LINZESS) 290 MCG CAPS capsule, Take 1 capsule (290 mcg total) by mouth daily., Disp: 30 capsule, Rfl: 0 .  lisinopril (PRINIVIL,ZESTRIL) 10 MG tablet, Take 20 mg by mouth daily. , Disp: , Rfl:  .  memantine (NAMENDA) 10 MG tablet, Take 5 mg by mouth 2 (two) times daily. , Disp: , Rfl: 1 .  metFORMIN (GLUCOPHAGE) 500 MG tablet, Take 500 mg by mouth daily with breakfast. , Disp: , Rfl:  .  Multiple Vitamin (MULITIVITAMIN WITH MINERALS) TABS, Take 1 tablet by mouth daily., Disp: , Rfl:  .  paliperidone (INVEGA) 9 MG 24 hr tablet, Take 1 tablet (9 mg total) by mouth every morning., Disp: 30 tablet, Rfl: 0 .  temazepam (RESTORIL) 7.5 MG capsule, Take 1 capsule (7.5 mg total) by mouth at bedtime., Disp: 30 capsule, Rfl: 0 .  thiamine 100 MG tablet, Take 1 tablet (100 mg total) by mouth daily., Disp: 30 tablet, Rfl: 3 .  traZODone (DESYREL) 50 MG tablet, Take 50 mg by mouth at bedtime., Disp: , Rfl: 1 .  Blood Pressure Monitoring (BLOOD  PRESSURE CUFF) MISC, 1 Units by Does not apply route once for 1 dose., Disp: 1 each, Rfl: 0   Allergy: No Known Allergies  Social Hx:   Social History        Social History  . Marital status: Divorced    Spouse name: N/A  . Number of children: N/A  . Years of education: N/A      Occupational History  . Not on file.         Social History Main Topics  . Smoking status: Current Every Day Smoker    Packs/day: 0.20    Types: Cigarettes  . Smokeless tobacco: Never Used  . Alcohol use Yes  . Drug use: Yes    Types: Marijuana  . Sexual activity: Not on file     Comment: quit 5 years ago       Other Topics Concern  . Not on file      Social History Narrative  . No narrative on file    Past Surgical Hx: History reviewed. No pertinent surgical history.  Past Medical Hx:  Past Medical History:  Diagnosis Date  . Dementia   . Diabetes mellitus   . Hypertension   . Schizophrenia [facility])     Past Gynecological History:  Patient reports 54 SVD's No LMP recorded. Patient is postmenopausal. last menstrual period age 67  Family Hx:       Family History  Problem Relation Age of Onset  . Heart disease Mother   . Diabetes Sister     Review of Systems:  Constitutional  Feels well,    ENT Normal appearing ears and nares bilaterally Skin/Breast  No rash, sores, jaundice, itching, dryness Cardiovascular  No chest pain, shortness of breath, or edema  Pulmonary  No cough or wheeze.  Gastro Intestinal  + nausea and emesis prior to admission, now none of these symptoms  Genito Urinary  No frequency, urgency, dysuria, no bleeding Musculo Skeletal  No myalgia, arthralgia, joint swelling or pain  Neurologic  No weakness, numbness, change in gait,  Psychology  + confusion  Vitals:  Blood pressure 125/68, pulse 82, temperature 98 F (36.7 C), temperature source Oral, resp. rate 17, height 5\' 5"  (1.651 m), weight 126 lb 8 oz (57.4  kg), SpO2 100 %.  Physical Exam: WD in NAD Neck  Supple NROM, without any enlargements.  Lymph Node Survey No cervical supraclavicular or inguinal adenopathy Cardiovascular  Pulse normal rate, regularity and rhythm. S1 and S2 normal.  Lungs  Clear to auscultation bilateraly, without wheezes/crackles/rhonchi. Good air movement.  Skin  No rash/lesions/breakdown  Psychiatry  Alert and oriented to person, place, and time  Abdomen  Normoactive bowel sounds, abdomen soft, non-tender and nonobese without evidence of hernia. There is a cystic fullness to the lower abdomen. Back No CVA tenderness Genito Urinary  Normal external genitalia. Normal cervix and uterus. Soft, mobile cystic mass fills pelvis into lower abdomen, non tender. Rectal  deferred Extremities  No bilateral cyanosis, clubbing or edema.   Thereasa Solo, MD

## 2017-05-18 ENCOUNTER — Telehealth: Payer: Self-pay | Admitting: Gynecologic Oncology

## 2017-05-18 ENCOUNTER — Telehealth: Payer: Self-pay | Admitting: *Deleted

## 2017-05-18 NOTE — Telephone Encounter (Signed)
Called and spoke with the patient's daughter to giver her the post op appt for June 6th at [facility]. Patient's daughter ask "does she have a diagnosis of cancer, and what is the diagnosis has of today?'" informed the daughter that I will give the message to [facility] APP and someone will call her back. The daughter would not take the appt until she spoke with [facility] APP

## 2017-05-18 NOTE — Telephone Encounter (Signed)
Call was transferred from pre-surgical testing.  The patient's daughter stated she wanted to hold off on her mother's procedure scheduled for May 14 until she gets her mother's life insurance policy together and initiated.  She states she will call the office once she is ready to reschedule her surgery.  All questions answered.  Advised to call for any needs.

## 2017-05-19 ENCOUNTER — Encounter (HOSPITAL_COMMUNITY): Payer: Self-pay

## 2017-05-19 ENCOUNTER — Emergency Department (HOSPITAL_COMMUNITY)
Admission: EM | Admit: 2017-05-19 | Discharge: 2017-05-20 | Disposition: A | Discharge status: Home / Self Care | Payer: Medicare Other | Attending: Emergency Medicine | Admitting: Emergency Medicine

## 2017-05-19 DIAGNOSIS — F17210 Nicotine dependence, cigarettes, uncomplicated: Secondary | ICD-10-CM | POA: Insufficient documentation

## 2017-05-19 DIAGNOSIS — E119 Type 2 diabetes mellitus without complications: Secondary | ICD-10-CM | POA: Insufficient documentation

## 2017-05-19 DIAGNOSIS — C569 Malignant neoplasm of unspecified ovary: Secondary | ICD-10-CM | POA: Insufficient documentation

## 2017-05-19 DIAGNOSIS — R410 Disorientation, unspecified: Secondary | ICD-10-CM | POA: Diagnosis present

## 2017-05-19 DIAGNOSIS — Z022 Encounter for examination for admission to residential institution: Secondary | ICD-10-CM | POA: Diagnosis not present

## 2017-05-19 DIAGNOSIS — I10 Essential (primary) hypertension: Secondary | ICD-10-CM | POA: Diagnosis not present

## 2017-05-19 DIAGNOSIS — Z79899 Other long term (current) drug therapy: Secondary | ICD-10-CM | POA: Diagnosis not present

## 2017-05-19 DIAGNOSIS — F0390 Unspecified dementia without behavioral disturbance: Secondary | ICD-10-CM | POA: Insufficient documentation

## 2017-05-19 DIAGNOSIS — Z8659 Personal history of other mental and behavioral disorders: Secondary | ICD-10-CM

## 2017-05-19 DIAGNOSIS — Z7984 Long term (current) use of oral hypoglycemic drugs: Secondary | ICD-10-CM | POA: Insufficient documentation

## 2017-05-19 LAB — CBC WITH DIFFERENTIAL/PLATELET
BASOS PCT: 0 %
Basophils Absolute: 0 10*3/uL (ref 0.0–0.1)
EOS PCT: 2 %
Eosinophils Absolute: 0.1 10*3/uL (ref 0.0–0.7)
HEMATOCRIT: 37.1 % (ref 36.0–46.0)
Hemoglobin: 12.5 g/dL (ref 12.0–15.0)
LYMPHS PCT: 31 %
Lymphs Abs: 2.4 10*3/uL (ref 0.7–4.0)
MCH: 29.1 pg (ref 26.0–34.0)
MCHC: 33.7 g/dL (ref 30.0–36.0)
MCV: 86.5 fL (ref 78.0–100.0)
MONO ABS: 0.4 10*3/uL (ref 0.1–1.0)
MONOS PCT: 5 %
Neutro Abs: 5 10*3/uL (ref 1.7–7.7)
Neutrophils Relative %: 62 %
PLATELETS: 328 10*3/uL (ref 150–400)
RBC: 4.29 MIL/uL (ref 3.87–5.11)
RDW: 16.5 % — AB (ref 11.5–15.5)
WBC: 7.9 10*3/uL (ref 4.0–10.5)

## 2017-05-19 LAB — URINALYSIS, ROUTINE W REFLEX MICROSCOPIC
Bilirubin Urine: NEGATIVE
Glucose, UA: NEGATIVE mg/dL
HGB URINE DIPSTICK: NEGATIVE
Ketones, ur: 5 mg/dL — AB
LEUKOCYTES UA: NEGATIVE
NITRITE: NEGATIVE
PROTEIN: NEGATIVE mg/dL
SPECIFIC GRAVITY, URINE: 1.013 (ref 1.005–1.030)
pH: 5 (ref 5.0–8.0)

## 2017-05-19 LAB — COMPREHENSIVE METABOLIC PANEL
ALT: 15 U/L (ref 14–54)
ANION GAP: 10 (ref 5–15)
AST: 16 U/L (ref 15–41)
Albumin: 4.2 g/dL (ref 3.5–5.0)
Alkaline Phosphatase: 80 U/L (ref 38–126)
BILIRUBIN TOTAL: 0.3 mg/dL (ref 0.3–1.2)
BUN: 6 mg/dL (ref 6–20)
CHLORIDE: 107 mmol/L (ref 101–111)
CO2: 21 mmol/L — ABNORMAL LOW (ref 22–32)
Calcium: 9.5 mg/dL (ref 8.9–10.3)
Creatinine, Ser: 0.6 mg/dL (ref 0.44–1.00)
Glucose, Bld: 123 mg/dL — ABNORMAL HIGH (ref 65–99)
Potassium: 3.7 mmol/L (ref 3.5–5.1)
Sodium: 138 mmol/L (ref 135–145)
TOTAL PROTEIN: 7.3 g/dL (ref 6.5–8.1)

## 2017-05-19 NOTE — ED Triage Notes (Signed)
Pt presents with her daughter.  Daughter reports pt has been diagnosed with dementia that has worsened x 3 weeks, reports pt was picked up by police after she walked off her property today.  Daughter reports pt is being worked up to r/o ovarian cancer.  Daughter requesting placement for pt.

## 2017-05-20 DIAGNOSIS — F17210 Nicotine dependence, cigarettes, uncomplicated: Secondary | ICD-10-CM | POA: Diagnosis not present

## 2017-05-20 DIAGNOSIS — R410 Disorientation, unspecified: Secondary | ICD-10-CM | POA: Diagnosis present

## 2017-05-20 DIAGNOSIS — C569 Malignant neoplasm of unspecified ovary: Secondary | ICD-10-CM | POA: Diagnosis not present

## 2017-05-20 DIAGNOSIS — I10 Essential (primary) hypertension: Secondary | ICD-10-CM | POA: Diagnosis not present

## 2017-05-20 DIAGNOSIS — F0390 Unspecified dementia without behavioral disturbance: Secondary | ICD-10-CM | POA: Diagnosis not present

## 2017-05-20 DIAGNOSIS — Z022 Encounter for examination for admission to residential institution: Secondary | ICD-10-CM | POA: Diagnosis not present

## 2017-05-20 DIAGNOSIS — Z79899 Other long term (current) drug therapy: Secondary | ICD-10-CM | POA: Diagnosis not present

## 2017-05-20 DIAGNOSIS — Z7984 Long term (current) use of oral hypoglycemic drugs: Secondary | ICD-10-CM | POA: Diagnosis not present

## 2017-05-20 DIAGNOSIS — E119 Type 2 diabetes mellitus without complications: Secondary | ICD-10-CM | POA: Diagnosis not present

## 2017-05-20 LAB — RAPID URINE DRUG SCREEN, HOSP PERFORMED
Amphetamines: NOT DETECTED
Barbiturates: NOT DETECTED
Benzodiazepines: NOT DETECTED
Cocaine: NOT DETECTED
Opiates: NOT DETECTED
TETRAHYDROCANNABINOL: NOT DETECTED

## 2017-05-20 LAB — ETHANOL: Alcohol, Ethyl (B): <10 mg/dL (ref <10)

## 2017-05-20 MED ORDER — BUPROPION HCL ER (XL) 150 MG PO TB24
150.0000 mg | ORAL_TABLET | Freq: Every day | ORAL | Status: DC
  Administered 2017-05-20: 150 mg via ORAL
  Filled 2017-05-20: qty 1

## 2017-05-20 MED ORDER — TRAZODONE HCL 50 MG PO TABS: 50.0000 mg | ORAL_TABLET | Freq: Every day | ORAL | Status: DC

## 2017-05-20 MED ORDER — MEMANTINE HCL 5 MG PO TABS
5.0000 mg | ORAL_TABLET | Freq: Two times a day (BID) | ORAL | Status: DC
  Administered 2017-05-20: 5 mg via ORAL
  Filled 2017-05-20: qty 1

## 2017-05-20 MED ORDER — LACTULOSE 10 GM/15ML PO SOLN
10.0000 g | Freq: Every day | ORAL | Status: DC
  Administered 2017-05-20: 10 g via ORAL
  Filled 2017-05-20: qty 15

## 2017-05-20 MED ORDER — VITAMIN B-1 100 MG PO TABS
100.0000 mg | ORAL_TABLET | Freq: Every day | ORAL | Status: DC
  Administered 2017-05-20: 100 mg via ORAL
  Filled 2017-05-20: qty 1

## 2017-05-20 MED ORDER — LISINOPRIL 20 MG PO TABS
20.0000 mg | ORAL_TABLET | Freq: Every day | ORAL | Status: DC
  Administered 2017-05-20: 20 mg via ORAL
  Filled 2017-05-20: qty 1

## 2017-05-20 MED ORDER — AMLODIPINE BESYLATE 5 MG PO TABS
5.0000 mg | ORAL_TABLET | Freq: Every day | ORAL | Status: DC
  Administered 2017-05-20: 5 mg via ORAL
  Filled 2017-05-20: qty 1

## 2017-05-20 MED ORDER — METFORMIN HCL 500 MG PO TABS
500.0000 mg | ORAL_TABLET | Freq: Every day | ORAL | Status: DC
  Administered 2017-05-20: 500 mg via ORAL
  Filled 2017-05-20: qty 1

## 2017-05-20 NOTE — Discharge Instructions (Addendum)
Please keep your appointment at your home at 3 PM with Janett Billow from [facility].  Her contact information is [phone].

## 2017-05-20 NOTE — ED Provider Notes (Signed)
[facility] EMERGENCY DEPARTMENT Provider Note   CSN: [id number] Arrival date & time: 05/19/17  2143    History   Chief Complaint No chief complaint on file.   HPI Kristen Colon is a 67 y.o. female.  67 year old female with a history of hypertension, diabetes, schizophrenia, and dementia presents to the emergency department with daughter.  Patient currently lives at home with her daughter.  Daughter reports worsening dementia symptoms over the past 3 weeks with increased confusion.  Daughter reports that the patient walked off the property today and was found wandering down the street tonight.  Patient is also currently being evaluated for ovarian cancer with plans for surgery later this month.  Daughter notes outpatient imaging of the patient's brain approximately 1 month ago which did not show any evidence of metastatic disease.  Last known imaging available through epic was in September 2018.  Daughter states that the patient's erratic behavior has made it very difficult to care for her.  Kristen Colon thinks the patient may be best suited, at least temporarily, in a memory care facility.  Kristen Colon has a history of psychiatric hospitalization at the end of March, but daughter states that this is "under control".  Level 5 caveat secondary to dementia  The history is provided by a relative. No language interpreter was used.    Past Medical History:  Diagnosis Date  . Dementia   . Diabetes mellitus   . Hypertension   . Schizophrenia [facility])     Patient Active Problem List   Diagnosis Date Noted  . 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 Elevated CA-125 05/12/2017  . Peritoneal carcinomatosis ([place]) 05/12/2017  . Ovarian cancer ([place]) 04/14/2017  . Ovarian mass 09/20/2016  . Non-intractable vomiting with nausea 09/20/2016  . Hypertension, uncontrolled 09/20/2016  . Diabetes mellitus type 2 in nonobese ([place]) 09/20/2016  . Acute encephalopathy 09/19/2016  . Schizoaffective disorder,  chronic condition with acute exacerbation ([place])   . Schizophrenia, undifferentiated ([place]) 06/21/2014  . Delusional disorder ([place])   . Psychoses ([place])   . Gallbladder polyp 07/21/2011  . Liver mass 07/21/2011    History reviewed. No pertinent surgical history.   OB History   None      Home Medications    Prior to Admission medications   Medication Sig Start Date End Date Taking? Authorizing Provider  amLODipine (NORVASC) 5 MG tablet Take 1 tablet (5 mg total) by mouth daily. 09/24/16   Rai, Vernelle Emerald, MD  Ascorbic Acid (VITAMIN C PO) Take by mouth.    [provider]  benztropine (COGENTIN) 0.5 MG tablet Take 1 tablet (0.5 mg total) by mouth 2 (two) times daily. 04/16/17 05/16/17  Arrien, Jimmy Picket, MD  buPROPion (WELLBUTRIN XL) 150 MG 24 hr tablet Take 150 mg by mouth daily.    [provider]  Cyanocobalamin (VITAMIN B 12 PO) Take by mouth.    [provider]  INVEGA SUSTENNA 156 MG/ML SUSP injection Inject 1 mL (156 mg total) into the muscle every 30 (thirty) days. 09/05/14   Dorie Rank, MD  lactulose (CHRONULAC) 10 GM/15ML solution Take 10 g by mouth daily. 08/12/16   [provider]  linaclotide (LINZESS) 290 MCG CAPS capsule Take 1 capsule (290 mcg total) by mouth daily. 04/16/17 05/16/17  Arrien, Jimmy Picket, MD  lisinopril (PRINIVIL,ZESTRIL) 10 MG tablet Take 20 mg by mouth daily.     [provider]  memantine (NAMENDA) 10 MG tablet Take 5 mg by mouth 2 (two) times  daily.  08/12/16   [provider]  metFORMIN (GLUCOPHAGE) 500 MG tablet Take 500 mg by mouth daily with breakfast.     [provider]  Multiple Vitamin (MULITIVITAMIN WITH MINERALS) TABS Take 1 tablet by mouth daily.    [provider]  paliperidone (INVEGA) 9 MG 24 hr tablet Take 1 tablet (9 mg total) by mouth every morning. 04/16/17 05/16/17  Arrien, Jimmy Picket, MD  temazepam (RESTORIL) 7.5 MG capsule Take 1 capsule (7.5 mg total) by  mouth at bedtime. 09/23/16   Rai, Vernelle Emerald, MD  thiamine 100 MG tablet Take 1 tablet (100 mg total) by mouth daily. 09/24/16   Rai, Ripudeep Raliegh Ip, MD  traZODone (DESYREL) 50 MG tablet Take 50 mg by mouth at bedtime. 08/12/16   [provider]    Family History Family History  Problem Relation Age of Onset  . Heart disease Mother   . Diabetes Sister     Social History Social History   Tobacco Use  . Smoking status: Current Every Day Smoker    Packs/day: 0.20    Types: Cigarettes  . Smokeless tobacco: Never Used  Substance Use Topics  . Alcohol use: Yes  . Drug use: Yes    Types: Marijuana     Allergies   Hydrocodone   Review of Systems Review of Systems  Unable to perform ROS: Dementia     Physical Exam Updated Vital Signs BP (!) 144/77   Pulse 62   Temp 97.7 F (36.5 C) (Oral)   Resp 16   SpO2 98%   Physical Exam  Constitutional: Kristen Colon appears well-developed and well-nourished. No distress.  Nontoxic appearing and in NAD. Pleasant.  HENT:  Head: Normocephalic and atraumatic.  Eyes: Conjunctivae and EOM are normal. No scleral icterus.  Neck: Normal range of motion.  Cardiovascular: Normal rate, regular rhythm and intact distal pulses.  Pulmonary/Chest: Effort normal. No stridor. No respiratory distress.  Respirations even and unlabored.  Musculoskeletal: Normal range of motion.  Neurological: Kristen Colon is alert. Kristen Colon exhibits normal muscle tone. Coordination normal.  Speech is clear, goal oriented. Patient moving all extremities spontaneously.  Skin: Skin is warm and dry. No rash noted. Kristen Colon is not diaphoretic. No erythema. No pallor.  Psychiatric: Kristen Colon has a normal mood and affect. Her behavior is normal.  Nursing note and vitals reviewed.    ED Treatments / Results  Labs (all labs ordered are listed, but only abnormal results are displayed) Labs Reviewed  CBC WITH DIFFERENTIAL/PLATELET - Abnormal; Notable for the following components:      Result Value     RDW 16.5 (*)    All other components within normal limits  COMPREHENSIVE METABOLIC PANEL - Abnormal; Notable for the following components:   CO2 21 (*)    Glucose, Bld 123 (*)    All other components within normal limits  URINALYSIS, ROUTINE W REFLEX MICROSCOPIC - Abnormal; Notable for the following components:   APPearance HAZY (*)    Ketones, ur 5 (*)    All other components within normal limits  RAPID URINE DRUG SCREEN, HOSP PERFORMED  ETHANOL    EKG None  Radiology No results found.  Procedures Procedures (including critical care time)  Medications Ordered in ED Medications  amLODipine (NORVASC) tablet 5 mg (has no administration in time range)  buPROPion (WELLBUTRIN XL) 24 hr tablet 150 mg (has no administration in time range)  lisinopril (PRINIVIL,ZESTRIL) tablet 20 mg (has no administration in time range)  memantine (NAMENDA) tablet 5  mg (has no administration in time range)  metFORMIN (GLUCOPHAGE) tablet 500 mg (has no administration in time range)  thiamine (VITAMIN B-1) tablet 100 mg (has no administration in time range)  traZODone (DESYREL) tablet 50 mg (has no administration in time range)  lactulose (CHRONULAC) 10 GM/15ML solution 10 g (has no administration in time range)       Initial Impression / Assessment and Plan / ED Course  I have reviewed the triage vital signs and the nursing notes.  Pertinent labs & imaging results that were available during my care of the patient were reviewed by me and considered in my medical decision making (see chart for details).     1:45 AM Patient presenting with daughter over concern for worsening dementia.  Kristen Colon is followed by outpatient Neurology.  Daughter reports compliance with medications; however, patient was found wandering down the street tonight.  Daughter is concerned that Kristen Colon is unable to care for the patient appropriately at home.  Kristen Colon thinks that Kristen Colon may benefit from a memory care facility.  Daughter  requesting evaluation for placement.  Will consult social work and case management in the morning.  Patient resting comfortably with no complaints.  Laboratory work-up has been reassuring.  5:08 AM Daughter of the patient can be reached at [phone].  Consult placed to social work and care management for evaluation in the morning.  6:23 AM Patient signed out to Joline Maxcy, PA-C at change of shift.   Final Clinical Impressions(s) / ED Diagnoses   Final diagnoses:  History of dementia    ED Discharge Orders    None       Antonietta Breach, PA-C 05/20/17 [place], Delice Bison, DO 05/20/17 [phone]

## 2017-05-20 NOTE — Progress Notes (Signed)
CSW received call from daughter expressing that she would be here to get pt at 2:15pm.    Virgie Dad. Geovonni Meyerhoff, MSW, [facility] Clinical Social Worker [phone]

## 2017-05-20 NOTE — ED Provider Notes (Signed)
67 year old female received at sign out from [place] pending social work and case management consult. Per her HPI:  "67 year old female with a history of hypertension, diabetes, schizophrenia, and dementia presents to the emergency department with daughter.  Patient currently lives at home with her daughter.  Daughter reports worsening dementia symptoms over the past 3 weeks with increased confusion.  Daughter reports that the patient walked off the property today and was found wandering down the street tonight.  Patient is also currently being evaluated for ovarian cancer with plans for surgery later this month.  Daughter notes outpatient imaging of the patient's brain approximately 1 month ago which did not show any evidence of metastatic disease.  Last known imaging available through epic was in September 2018.  Daughter states that the patient's erratic behavior has made it very difficult to care for her.  She thinks the patient may be best suited, at least temporarily, in a memory care facility.  She has a history of psychiatric hospitalization at the end of March, but daughter states that this is "under control".  Level 5 caveat secondary to dementia"  Physical Exam  BP 121/89 (BP Location: Right Arm)   Pulse 77   Temp 97.6 F (36.4 C) (Oral)   Resp 16   SpO2 100%   Physical Exam Sitting comfortably in NAD.   ED Course/Procedures     Procedures  MDM   67 year old female with a history of hypertension, diabetes, schizophrenia, and dementia received at sign out from [place] pending case management and social work consult. She is followed by OP neurology.  Daughter is concerned that she may not be able to care for the patient appropriately at home.  Social work and case management have spoken with Janett Billow from [facility] who will be available to assess the patient this afternoon at 3 PM in the daughter's home to see if the patient is appropriate for holding heights.  Janett Billow has  requested a QuantiFERON prior to placing the patient, which has been ordered in the emergency department.  Results pending.  Kiara from social work has spoken with the patient's daughter who will be available to pick the patient up today at 2 PM.    At discharge, the patient's daughter has asked to speak with me and is requesting multiple prescriptions for medications to help with sundown and insomnia.  Specifically, she is requesting a refill of temazepam.  She states that this medication was discontinued during the patient's most recent admission in March 2019. Discussed with the patient's daughter that I would recommend melatonin for insomnia.  I recommended scheduling a follow-up appointment with her behavioral health provider or primary care.  All questions were answered.  At this time, she is medically cleared, hemodynamically stable, in no acute distress, and safe for discharge to home.      Joline Maxcy A, PA-C 05/20/17 1729

## 2017-05-20 NOTE — Discharge Planning (Signed)
Clinical Social Work is seeking post-discharge placement for this patient at the following level of care: ALF.    

## 2017-05-20 NOTE — Progress Notes (Addendum)
10:39am- CSW spoke with Janett Billow and provided her with address to met family for assessment as well as faxed over FL2 to assist with placement as much as possible. Janett Billow confirmed that she would reach out to daughter for further communication of needs. At this time daughter is expected to pick pt up at 2pm where Janett Billow will follow up with family at the home. CSW has left contact infoamtion in pt's room for care giver support groups as well as for respite care as needed. At this time there are no further CSW needs, CSW will sign off.   10:01am- CSW spoke with Janett Billow from [facility] [phone]. CSW was informed that Janett Billow is agreeable to met pt and daughter today at [place] asked if Janett Billow would be able to follow up with pt and daughter at home as pt is not being admitted and probably wouldn't be able to be held in the ED just for placement. Janett Billow confirmed that she is able to meet pt and daughter at [place], [id number] at [place] expressed to CSW that she isnt sure if they will be able to take pt today but it would be as early as Friday if everything including assessment goes well for pt and daughter and pt meets criteria for placement at this time. CSW has spoken with doctor to get TB test completed and was informed that this could be read by [facility] service RN if pt is home.   CSW has spoken with daughter and daughter expressed that she would pick pt up at 2pm as she is headed to [place] at this time. Plan as of now is for pt to be discharged home with [facility] services and for Jessica with [facility] to meet with pt and daughter at the above address. CSW did inform daughter that Janett Billow would discuss things with daughter once at the home and will complete paperwork for admission if pt qualifies for placement. CSW was informed by daughter that she is under the impression that pt has long term medicaid but if not then she is willing to go apply for it in order for pt to get the  things pt needs. CSW explained that this may delay placement for pt and daughter states "that's fine I just need some help".   8:56am- CSW received call back from Philippa Sicks representative from different ALF's and Memory Care facilities. Rollene Fare expressed that none of her facilities are able to take pt as they only take private pay individuals. CSW expressed understanding and still is awaiting call from [place] with [facility] [phone]).   8:51am- CSW received contact information for [facility] [place]. CSW informed that this facility MAY be able to provide respite care for pt and daughter at this time. CSW gathered more needed information from daughter as requested by [facility]. Daughter expressed that pt only gets $400 a mont for Social Security at this time. CSW informed Onalee Hua of this and Onalee Hua expressed that daughter may need to go to DSS ad apply for SSI in order to raise that amount to $750 so that Medicaid will pay for this in full. Hazel expressed that she would call CSW back around 10:30am to speak with daughter as needed about this time. CSW still waiting for returned call from other two representative at this time to see I pt would be eligible for their facility before updating daughter and pt of resources found.   CSW spoke with pt at bedside. Pt  was alert but was unable to tell CSW the correct day of the week or month. Pt was able to identify the year as 2019 as well. CSW was informed that pt is from home with daughter. CSW received verbal permission to speak with pt's daughter from pt. CSW spoke with daughter and daughter expressed that she is stressed and needing help with placing pt at this time. CSW was informed that daughter is looking for temporary memory care/assistance living placement until daughter is able to find/hire more help to help with pt. CSW was informed by daughter that daughter has been caring for pt for years and now pt has progressevly gotten worse with  dementia and daughter is unable to manage.Daughter expressed that pt goes to [facility] which allows daughter some time to gather more resources for pt. CSW explained to daughter that usually in the ED memory care or ALF placements are not done as those placements can take a little longer especially when insurance is the barrier. Daughter expressed verbal understanding of this. CSW explained to daughter that if pt is sent back home due to not being able to be placed immediatly from the ED then CSW would provide further information and resources on who to contact to help with Memory Care placement at this time.   CSW has spoken with two representative to see what further services are available to pt with insurance on file. At this time CSW waits for them to follow up with CSW so that CSW can update family of information.    Virgie Dad , MSW, [facility] Clinical Social Worker [phone]

## 2017-05-20 NOTE — Discharge Planning (Signed)
Ellia Knowlton J. Clydene Laming, RN, BSN, [employer] [phone] Spoke with pt at bedside regarding discharge planning for [facility]. Offered pt list of home health agencies to choose from.  Pt chose [facility] to render services. Glyn Ade of [facility] notified. Patient made aware that [facility] will be in contact in 24-48 hours.  No DME needs identified at this time.

## 2017-05-20 NOTE — NC FL2 (Addendum)
[place] LEVEL OF CARE SCREENING TOOL     IDENTIFICATION  Patient Name: Kristen Colon Birthdate: 11/21/1950 Sex: female Admission Date (Current Location): 05/19/2017  [facility] and [place] Number:  Herbalist and Address:  [facility], [place] [address] [place], [place] [address]      Provider Number: [id number]  Attending Physician Name and Address:  Ward, Delice Bison, DO  Relative Name and Phone Number:       Current Level of Care: Hospital Recommended Level of Care: [place], Memory Care Prior Approval Number:    Date Approved/Denied:   PASRR Number:    Discharge Plan: Other (Comment)(ALF/Memory Care)    Current Diagnoses: Patient Active Problem List   Diagnosis Date Noted  . 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 Elevated CA-125 05/12/2017  . 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 Ovarian cancer ([place]) 04/14/2017  . Ovarian mass 09/20/2016  . Non-intractable vomiting with nausea 09/20/2016  . Hypertension, uncontrolled 09/20/2016  . Diabetes mellitus type 2 in nonobese ([place]) 09/20/2016  . Acute encephalopathy 09/19/2016  . Schizoaffective disorder, chronic condition with acute exacerbation ([place])   . Schizophrenia, undifferentiated ([place]) 06/21/2014  . Delusional disorder ([place])   . 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 Gallbladder polyp 07/21/2011  . Liver mass 07/21/2011    Orientation RESPIRATION BLADDER Height & Weight     Self  Normal Continent Weight:   Height:     BEHAVIORAL SYMPTOMS/MOOD NEUROLOGICAL BOWEL NUTRITION STATUS      Continent Diet(please see AVS or Discharge summary. )  AMBULATORY STATUS COMMUNICATION OF NEEDS Skin   Limited Assist Verbally Normal                       Personal Care Assistance Level of Assistance  Bathing, Feeding, Dressing Bathing Assistance: Maximum assistance Feeding assistance: Limited assistance Dressing Assistance: Maximum assistance     Functional Limitations  Info  Sight, Hearing, Speech Sight Info: Adequate Hearing Info: Adequate Speech Info: Adequate    SPECIAL CARE FACTORS FREQUENCY                       Contractures Contractures Info: Not present    Additional Factors Info  Code Status, Allergies, Psychotropic Code Status Info: Prior  Allergies Info: Hydrocodone Psychotropic Info: buPROPion (WELLBUTRIN XL) 24 hr tablet 150 mg, memantine (NAMENDA) tablet 5 mg, traZODone (DESYREL) tablet 50 mg          Current Medications (05/20/2017):  This is the current hospital active medication list Current Facility-Administered Medications  Medication Dose Route Frequency Provider Last Rate Last Dose  . amLODipine (NORVASC) tablet 5 mg  5 mg Oral Daily Antonietta Breach, PA-C   5 mg at 05/20/17 0647  . buPROPion (WELLBUTRIN XL) 24 hr tablet 150 mg  150 mg Oral Daily Antonietta Breach, PA-C   150 mg at 05/20/17 0743  . lactulose (CHRONULAC) 10 GM/15ML solution 10 g  10 g Oral Daily Antonietta Breach, PA-C   10 g at 05/20/17 5956  . lisinopril (PRINIVIL,ZESTRIL) tablet 20 mg  20 mg Oral Daily Antonietta Breach, PA-C   20 mg at 05/20/17 3875  . memantine (NAMENDA) tablet 5 mg  5 mg Oral BID Antonietta Breach, PA-C   5 mg at 05/20/17 6433  . metFORMIN (GLUCOPHAGE) tablet 500 mg  500 mg Oral Q breakfast Antonietta Breach, PA-C   500 mg at 05/20/17 2951  . thiamine (VITAMIN B-1) tablet 100  mg  100 mg Oral Daily Antonietta Breach, PA-C   100 mg at 05/20/17 6010  . traZODone (DESYREL) tablet 50 mg  50 mg Oral QHS Antonietta Breach, PA-C       Current Outpatient Medications  Medication Sig Dispense Refill  . amLODipine (NORVASC) 5 MG tablet Take 1 tablet (5 mg total) by mouth daily. 30 tablet 3  . Ascorbic Acid (VITAMIN C PO) Take by mouth.    . benztropine (COGENTIN) 0.5 MG tablet Take 1 tablet (0.5 mg total) by mouth 2 (two) times daily. 60 tablet 0  . buPROPion (WELLBUTRIN XL) 150 MG 24 hr tablet Take 150 mg by mouth daily.    . Cyanocobalamin (VITAMIN B 12 PO) Take by mouth.    Lorayne Bender SUSTENNA 156 MG/ML SUSP injection Inject 1 mL (156 mg total) into the muscle every 30 (thirty) days. 0.9 mL 0  . lactulose (CHRONULAC) 10 GM/15ML solution Take 10 g by mouth daily.  3  . linaclotide (LINZESS) 290 MCG CAPS capsule Take 1 capsule (290 mcg total) by mouth daily. 30 capsule 0  . lisinopril (PRINIVIL,ZESTRIL) 10 MG tablet Take 20 mg by mouth daily.     . memantine (NAMENDA) 10 MG tablet Take 5 mg by mouth 2 (two) times daily.   1  . metFORMIN (GLUCOPHAGE) 500 MG tablet Take 500 mg by mouth daily with breakfast.     . Multiple Vitamin (MULITIVITAMIN WITH MINERALS) TABS Take 1 tablet by mouth daily.    . paliperidone (INVEGA) 9 MG 24 hr tablet Take 1 tablet (9 mg total) by mouth every morning. 30 tablet 0  . temazepam (RESTORIL) 7.5 MG capsule Take 1 capsule (7.5 mg total) by mouth at bedtime. 30 capsule 0  . thiamine 100 MG tablet Take 1 tablet (100 mg total) by mouth daily. 30 tablet 3  . traZODone (DESYREL) 50 MG tablet Take 50 mg by mouth at bedtime.  1     Discharge Medications: Please see discharge summary for a list of discharge medications.  Relevant Imaging Results:  Relevant Lab Results:   Additional Information SSN- [id number]  Wetzel Bjornstad, LCSWA

## 2017-05-20 NOTE — ED Notes (Signed)
Pt refused blood draw,   Notified nurse. 

## 2017-05-29 ENCOUNTER — Telehealth: Payer: Self-pay

## 2017-05-29 NOTE — Telephone Encounter (Signed)
CSW received phone call from Glyn Ade with [facility]. HH was asking to review if pt was set up with home health. Per notes and face to face order, pt established with [facility] with a nurse.   Wendelyn Breslow, [facility]  [phone]

## 2017-06-08 ENCOUNTER — Telehealth: Payer: Self-pay | Admitting: Gynecologic Oncology

## 2017-06-08 NOTE — Telephone Encounter (Signed)
Received phone call from [facility] at [facility] pre-surgical testing about patient's upcoming procedure on May 23.  She states she spoke with the daughter who stated she does not have the insurance (life) together yet and does not want to proceed with surgery for her mother at this time. She states she will contact the office.

## 2017-06-11 ENCOUNTER — Encounter (HOSPITAL_COMMUNITY): Admission: RE | Payer: Self-pay | Source: Ambulatory Visit

## 2017-06-11 ENCOUNTER — Ambulatory Visit (HOSPITAL_COMMUNITY): Admission: RE | Admit: 2017-06-11 | Payer: Medicare Other | Source: Ambulatory Visit | Admitting: Gynecologic Oncology

## 2017-06-11 SURGERY — LAPAROSCOPY, DIAGNOSTIC
Anesthesia: General

## 2017-06-16 ENCOUNTER — Telehealth: Payer: Self-pay

## 2017-06-16 ENCOUNTER — Encounter: Payer: Self-pay | Admitting: Gynecologic Oncology

## 2017-06-16 NOTE — Telephone Encounter (Signed)
Pt's daughter [name] called and has chose June 11th as surgery date. Reminded of pre-surgical appt - nurse will call her few days prior. She wants to remind Dr Denman George patient has difficulty tolerating blood draw.  Notified Dr Denman George and [name] NP. No other needs per pt's daughter at this time.

## 2017-06-17 ENCOUNTER — Telehealth: Payer: Self-pay | Admitting: *Deleted

## 2017-06-17 NOTE — Telephone Encounter (Signed)
Called and spoke to the daughter regarding the post op appt. Date and time discussed with the daughter

## 2017-06-17 NOTE — Progress Notes (Signed)
Patient's daughter showed up to the office stating she is ready to reschedule her mother's surgery.  All questions answered.  She stated she had to get some things in order prior to surgery including her mother's cancer policy.  Multiple dates given.  She is going to call back to the office with the date selected.  Update: Patient's daughter called the office stating she would like to proceed with surgery on June 30, 2017.

## 2017-06-24 NOTE — Patient Instructions (Signed)
Kristen Colon  06/24/2017   Your procedure is scheduled on: 06-30-17  Report to [facility] Main  Entrance              Report to admitting at     0530 AM    Call this number if you have problems the morning of surgery [phone]               FOLLOW A LIGHT DIET PER YOUR DOCTOR THE DAY BEFORE YOUR SURGERY EXAMPLES ARE : TOAST, MASHED POTATOES, YOGURT, SOUPS             AND BROTHS,  AVOID: CARBONATED (FIZZY BEVERAGES), RAW FRUITS AND VEGGIES AND BEANS.              Remember: Do not eat food or drink liquids :After Midnight.     Take these medicines the morning of surgery with A SIP OF WATER: NAMENDA, PALIPERIDONE ( INVEGA), BUPROPROPION (WELLBUTRIN), BENZTROPINE (COGENTIN), AMLODIPINE ( NORVASC)  DO NOT TAKE ANY DIABETIC MEDICATIONS DAY OF YOUR SURGERY                               You may not have any metal on your body including hair pins and              piercings  Do not wear jewelry, make-up, lotions, powders or perfumes, deodorant             Do not wear nail polish.  Do not shave  48 hours prior to surgery.     Do not bring valuables to the hospital. [place].  Contacts, dentures or bridgework may not be worn into surgery.  Leave suitcase in the car. After surgery it may be brought to your room.                Please read over the following fact sheets you were given:  [place] AFTER SURGERY AND USE YOUR INCENTIVE  SPIROMETER! _____________________________________________________________________           [facility] - Preparing for Surgery Before surgery, you can play an important role.  Because skin is not sterile, your skin needs to be as free of germs as possible.  You can reduce the number of germs on your skin by washing with CHG (chlorahexidine gluconate) soap before surgery.  CHG is an antiseptic cleaner which kills germs and bonds with the skin to continue  killing germs even after washing. Please DO NOT use if you have an allergy to CHG or antibacterial soaps.  If your skin becomes reddened/irritated stop using the CHG and inform your nurse when you arrive at Short Stay. Do not shave (including legs and underarms) for at least 48 hours prior to the first CHG shower.  You may shave your face/neck. Please follow these instructions carefully:  1.  Shower with CHG Soap the night before surgery and the  morning of Surgery.  2.  If you choose to wash your hair, wash your hair first as usual with your  normal  shampoo.  3.  After you shampoo, rinse your hair and body thoroughly to remove the  shampoo.  4.  Use CHG as you would any other liquid soap.  You can apply chg directly  to the skin and wash                       Gently with a scrungie or clean washcloth.  5.  Apply the CHG Soap to your body ONLY FROM THE NECK DOWN.   Do not use on face/ open                           Wound or open sores. Avoid contact with eyes, ears mouth and genitals (private parts).                       Wash face,  Genitals (private parts) with your normal soap.             6.  Wash thoroughly, paying special attention to the area where your surgery  will be performed.  7.  Thoroughly rinse your body with warm water from the neck down.  8.  DO NOT shower/wash with your normal soap after using and rinsing off  the CHG Soap.                9.  Pat yourself dry with a clean towel.            10.  Wear clean pajamas.            11.  Place clean sheets on your bed the night of your first shower and do not  sleep with pets. Day of Surgery : Do not apply any lotions/deodorants the morning of surgery.  Please wear clean clothes to the hospital/surgery center.  FAILURE TO FOLLOW THESE INSTRUCTIONS MAY RESULT IN THE CANCELLATION OF YOUR SURGERY PATIENT SIGNATURE_________________________________  NURSE  SIGNATURE__________________________________  ________________________________________________________________________  WHAT IS A BLOOD TRANSFUSION? Blood Transfusion Information  A transfusion is the replacement of blood or some of its parts. Blood is made up of multiple cells which provide different functions.  Red blood cells carry oxygen and are used for blood loss replacement.  White blood cells fight against infection.  Platelets control bleeding.  Plasma helps clot blood.  Other blood products are available for specialized needs, such as hemophilia or other clotting disorders. BEFORE THE TRANSFUSION  Who gives blood for transfusions?   Healthy volunteers who are fully evaluated to make sure their blood is safe. This is blood bank blood. Transfusion therapy is the safest it has ever been in the practice of medicine. Before blood is taken from a donor, a complete history is taken to make sure that person has no history of diseases nor engages in risky social behavior (examples are intravenous drug use or sexual activity with multiple partners). The donor's travel history is screened to minimize risk of transmitting infections, such as malaria. The donated blood is tested for signs of infectious diseases, such as HIV and hepatitis. The blood is then tested to be sure it is compatible with you in order to minimize the chance of a transfusion reaction. If you or a relative donates blood, this is often done in anticipation of surgery and is not appropriate for emergency situations. It takes many days to process the donated blood. RISKS AND COMPLICATIONS Although transfusion therapy is very safe and saves many lives, the main dangers of transfusion include:   Getting an infectious disease.  Developing a transfusion reaction. This  is an allergic reaction to something in the blood you were given. Every precaution is taken to prevent this. The decision to have a blood transfusion has been  considered carefully by your caregiver before blood is given. Blood is not given unless the benefits outweigh the risks. AFTER THE TRANSFUSION  Right after receiving a blood transfusion, you will usually feel much better and more energetic. This is especially true if your red blood cells have gotten low (anemic). The transfusion raises the level of the red blood cells which carry oxygen, and this usually causes an energy increase.  The nurse administering the transfusion will monitor you carefully for complications. HOME CARE INSTRUCTIONS  No special instructions are needed after a transfusion. You may find your energy is better. Speak with your caregiver about any limitations on activity for underlying diseases you may have. SEEK MEDICAL CARE IF:   Your condition is not improving after your transfusion.  You develop redness or irritation at the intravenous (IV) site. SEEK IMMEDIATE MEDICAL CARE IF:  Any of the following symptoms occur over the next 12 hours:  Shaking chills.  You have a temperature by mouth above 102 F (38.9 C), not controlled by medicine.  Chest, back, or muscle pain.  People around you feel you are not acting correctly or are confused.  Shortness of breath or difficulty breathing.  Dizziness and fainting.  You get a rash or develop hives.  You have a decrease in urine output.  Your urine turns a dark color or changes to pink, red, or brown. Any of the following symptoms occur over the next 10 days:  You have a temperature by mouth above 102 F (38.9 C), not controlled by medicine.  Shortness of breath.  Weakness after normal activity.  The white part of the eye turns yellow (jaundice).  You have a decrease in the amount of urine or are urinating less often.  Your urine turns a dark color or changes to pink, red, or brown. Document Released: 01/04/2000 Document Revised: 03/31/2011 Document Reviewed: 08/23/2007 ExitCare Patient Information 2014  [place].  _______________________________________________________________________  Incentive Spirometer  An incentive spirometer is a tool that can help keep your lungs clear and active. This tool measures how well you are filling your lungs with each breath. Taking long deep breaths may help reverse or decrease the chance of developing breathing (pulmonary) problems (especially infection) following:  A long period of time when you are unable to move or be active. BEFORE THE PROCEDURE   If the spirometer includes an indicator to show your best effort, your nurse or respiratory therapist will set it to a desired goal.  If possible, sit up straight or lean slightly forward. Try not to slouch.  Hold the incentive spirometer in an upright position. INSTRUCTIONS FOR USE  1. Sit on the edge of your bed if possible, or sit up as far as you can in bed or on a chair. 2. Hold the incentive spirometer in an upright position. 3. Breathe out normally. 4. Place the mouthpiece in your mouth and seal your lips tightly around it. 5. Breathe in slowly and as deeply as possible, raising the piston or the ball toward the top of the column. 6. Hold your breath for 3-5 seconds or for as long as possible. Allow the piston or ball to fall to the bottom of the column. 7. Remove the mouthpiece from your mouth and breathe out normally. 8. Rest for a few seconds and repeat Steps 1  through 7 at least 10 times every 1-2 hours when you are awake. Take your time and take a few normal breaths between deep breaths. 9. The spirometer may include an indicator to show your best effort. Use the indicator as a goal to work toward during each repetition. 10. After each set of 10 deep breaths, practice coughing to be sure your lungs are clear. If you have an incision (the cut made at the time of surgery), support your incision when coughing by placing a pillow or rolled up towels firmly against it. Once you are able to get  out of bed, walk around indoors and cough well. You may stop using the incentive spirometer when instructed by your caregiver.  RISKS AND COMPLICATIONS  Take your time so you do not get dizzy or light-headed.  If you are in pain, you may need to take or ask for pain medication before doing incentive spirometry. It is harder to take a deep breath if you are having pain. AFTER USE  Rest and breathe slowly and easily.  It can be helpful to keep track of a log of your progress. Your caregiver can provide you with a simple table to help with this. If you are using the spirometer at home, follow these instructions: [place] IF:   You are having difficultly using the spirometer.  You have trouble using the spirometer as often as instructed.  Your pain medication is not giving enough relief while using the spirometer.  You develop fever of 100.5 F (38.1 C) or higher. SEEK IMMEDIATE MEDICAL CARE IF:   You cough up bloody sputum that had not been present before.  You develop fever of 102 F (38.9 C) or greater.  You develop worsening pain at or near the incision site. MAKE SURE YOU:   Understand these instructions.  Will watch your condition.  Will get help right away if you are not doing well or get worse. Document Released: 05/19/2006 Document Revised: 03/31/2011 Document Reviewed: 07/20/2006 [facility] Patient Information 2014 [place], [place].   ________________________________________________________________________

## 2017-06-24 NOTE — Progress Notes (Signed)
EKG 04-14-17 Epic  CXR 04-14-17 EPIC

## 2017-06-25 ENCOUNTER — Inpatient Hospital Stay (HOSPITAL_COMMUNITY)
Admission: RE | Admit: 2017-06-25 | Discharge: 2017-06-25 | Disposition: A | Discharge status: Home / Self Care | Payer: Self-pay | Source: Ambulatory Visit

## 2017-06-25 ENCOUNTER — Encounter (HOSPITAL_COMMUNITY): Payer: Self-pay

## 2017-06-25 ENCOUNTER — Ambulatory Visit: Payer: Self-pay | Admitting: Gynecologic Oncology

## 2017-06-26 ENCOUNTER — Telehealth: Payer: Self-pay | Admitting: Gynecologic Oncology

## 2017-06-26 NOTE — Telephone Encounter (Signed)
Advised patient's daughter that the patient could receive her invega injection prior to surgery.  Any potential risks in terms of blood thinning properties discussed with Larkin Ina at [facility] and no contraindications found.  Dr. Denman George aware.  Patient's daughter states "since she was sick, I went ahead and cancelled.  She can get it in the hospital."

## 2017-06-26 NOTE — Telephone Encounter (Signed)
Called and spoke with the daughter.  All questions related to surgery answered.  She is asking whether her mother would be put to sleep and whether she would be staying the night in the hospital after surgery.  Daughter has several concerns about her BP and diabetic medication but advised that needed to be addressed with her PCP.

## 2017-06-29 ENCOUNTER — Encounter (HOSPITAL_COMMUNITY)
Admission: RE | Admit: 2017-06-29 | Discharge: 2017-06-29 | Disposition: A | Discharge status: Home / Self Care | Payer: Medicare Other | Source: Ambulatory Visit | Attending: Gynecologic Oncology | Admitting: Gynecologic Oncology

## 2017-06-29 ENCOUNTER — Other Ambulatory Visit: Payer: Self-pay

## 2017-06-29 ENCOUNTER — Encounter: Payer: Self-pay | Admitting: Gynecologic Oncology

## 2017-06-29 ENCOUNTER — Ambulatory Visit: Payer: Self-pay | Admitting: Gynecologic Oncology

## 2017-06-29 ENCOUNTER — Encounter (HOSPITAL_COMMUNITY): Payer: Self-pay

## 2017-06-29 DIAGNOSIS — Z79899 Other long term (current) drug therapy: Secondary | ICD-10-CM | POA: Diagnosis not present

## 2017-06-29 DIAGNOSIS — F17210 Nicotine dependence, cigarettes, uncomplicated: Secondary | ICD-10-CM | POA: Diagnosis not present

## 2017-06-29 DIAGNOSIS — I10 Essential (primary) hypertension: Secondary | ICD-10-CM | POA: Diagnosis not present

## 2017-06-29 DIAGNOSIS — R1900 Intra-abdominal and pelvic swelling, mass and lump, unspecified site: Secondary | ICD-10-CM | POA: Diagnosis present

## 2017-06-29 DIAGNOSIS — F200 Paranoid schizophrenia: Secondary | ICD-10-CM | POA: Diagnosis not present

## 2017-06-29 DIAGNOSIS — C7989 Secondary malignant neoplasm of other specified sites: Secondary | ICD-10-CM | POA: Diagnosis not present

## 2017-06-29 DIAGNOSIS — Z01812 Encounter for preprocedural laboratory examination: Secondary | ICD-10-CM | POA: Diagnosis not present

## 2017-06-29 DIAGNOSIS — N735 Female pelvic peritonitis, unspecified: Secondary | ICD-10-CM | POA: Diagnosis not present

## 2017-06-29 DIAGNOSIS — C786 Secondary malignant neoplasm of retroperitoneum and peritoneum: Secondary | ICD-10-CM | POA: Diagnosis not present

## 2017-06-29 DIAGNOSIS — F209 Schizophrenia, unspecified: Secondary | ICD-10-CM | POA: Diagnosis not present

## 2017-06-29 DIAGNOSIS — E119 Type 2 diabetes mellitus without complications: Secondary | ICD-10-CM | POA: Diagnosis not present

## 2017-06-29 DIAGNOSIS — Z7984 Long term (current) use of oral hypoglycemic drugs: Secondary | ICD-10-CM | POA: Diagnosis not present

## 2017-06-29 DIAGNOSIS — R188 Other ascites: Secondary | ICD-10-CM | POA: Diagnosis not present

## 2017-06-29 DIAGNOSIS — C562 Malignant neoplasm of left ovary: Secondary | ICD-10-CM | POA: Diagnosis not present

## 2017-06-29 DIAGNOSIS — F0390 Unspecified dementia without behavioral disturbance: Secondary | ICD-10-CM | POA: Diagnosis not present

## 2017-06-29 HISTORY — DX: Anxiety disorder, unspecified: F41.9

## 2017-06-29 LAB — GLUCOSE, CAPILLARY: Glucose-Capillary: 164 mg/dL — ABNORMAL HIGH (ref 65–99)

## 2017-06-29 NOTE — Patient Instructions (Signed)
Kristen Colon  06/29/2017   Your procedure is scheduled on: 06-30-17   Report to [facility] Main  Entrance               Report to admitting at        0530 AM    Call this number if you have problems the morning of surgery [phone]               Follow a light diet the day before surgery  Mashed potatoes, yogurt, soups and broths. Avoid Raw fruits and veggies and carbonated beverages and beans  Remember: Do not eat food or drink liquids :After Midnight.     Take these medicines the morning of surgery with A SIP OF WATER:  DO NOT TAKE ANY DIABETIC MEDICATIONS DAY OF YOUR SURGERY                               You may not have any metal on your body including hair pins and              piercings  Do not wear jewelry, make-up, lotions, powders or perfumes, deodorant             Do not wear nail polish.  Do not shave  48 hours prior to surgery.     Do not bring valuables to the hospital. [place].  Contacts, dentures or bridgework may not be worn into surgery.  Leave suitcase in the car. After surgery it may be brought to your room.                Please read over the following fact sheets you were given: _____________________________________________________________________          [facility] - Preparing for Surgery Before surgery, you can play an important role.  Because skin is not sterile, your skin needs to be as free of germs as possible.  You can reduce the number of germs on your skin by washing with CHG (chlorahexidine gluconate) soap before surgery.  CHG is an antiseptic cleaner which kills germs and bonds with the skin to continue killing germs even after washing. Please DO NOT use if you have an allergy to CHG or antibacterial soaps.  If your skin becomes reddened/irritated stop using the CHG and inform your nurse when you arrive at Short Stay. Do not shave (including legs and underarms) for at  least 48 hours prior to the first CHG shower.  You may shave your face/neck. Please follow these instructions carefully:  1.  Shower with CHG Soap the night before surgery and the  morning of Surgery.  2.  If you choose to wash your hair, wash your hair first as usual with your  normal  shampoo.  3.  After you shampoo, rinse your hair and body thoroughly to remove the  shampoo.                           4.  Use CHG as you would any other liquid soap.  You can apply chg directly  to the skin and wash  Gently with a scrungie or clean washcloth.  5.  Apply the CHG Soap to your body ONLY FROM THE NECK DOWN.   Do not use on face/ open                           Wound or open sores. Avoid contact with eyes, ears mouth and genitals (private parts).                       Wash face,  Genitals (private parts) with your normal soap.             6.  Wash thoroughly, paying special attention to the area where your surgery  will be performed.  7.  Thoroughly rinse your body with warm water from the neck down.  8.  DO NOT shower/wash with your normal soap after using and rinsing off  the CHG Soap.                9.  Pat yourself dry with a clean towel.            10.  Wear clean pajamas.            11.  Place clean sheets on your bed the night of your first shower and do not  sleep with pets. Day of Surgery : Do not apply any lotions/deodorants the morning of surgery.  Please wear clean clothes to the hospital/surgery center.  FAILURE TO FOLLOW THESE INSTRUCTIONS MAY RESULT IN THE CANCELLATION OF YOUR SURGERY PATIENT SIGNATURE_________________________________  NURSE SIGNATURE__________________________________  ________________________________________________________________________  WHAT IS A BLOOD TRANSFUSION? Blood Transfusion Information  A transfusion is the replacement of blood or some of its parts. Blood is made up of multiple cells which provide different functions.  Red  blood cells carry oxygen and are used for blood loss replacement.  White blood cells fight against infection.  Platelets control bleeding.  Plasma helps clot blood.  Other blood products are available for specialized needs, such as hemophilia or other clotting disorders. BEFORE THE TRANSFUSION  Who gives blood for transfusions?   Healthy volunteers who are fully evaluated to make sure their blood is safe. This is blood bank blood. Transfusion therapy is the safest it has ever been in the practice of medicine. Before blood is taken from a donor, a complete history is taken to make sure that person has no history of diseases nor engages in risky social behavior (examples are intravenous drug use or sexual activity with multiple partners). The donor's travel history is screened to minimize risk of transmitting infections, such as malaria. The donated blood is tested for signs of infectious diseases, such as HIV and hepatitis. The blood is then tested to be sure it is compatible with you in order to minimize the chance of a transfusion reaction. If you or a relative donates blood, this is often done in anticipation of surgery and is not appropriate for emergency situations. It takes many days to process the donated blood. RISKS AND COMPLICATIONS Although transfusion therapy is very safe and saves many lives, the main dangers of transfusion include:   Getting an infectious disease.  Developing a transfusion reaction. This is an allergic reaction to something in the blood you were given. Every precaution is taken to prevent this. The decision to have a blood transfusion has been considered carefully by your caregiver before blood is given. Blood is not given unless the benefits outweigh  the risks. AFTER THE TRANSFUSION  Right after receiving a blood transfusion, you will usually feel much better and more energetic. This is especially true if your red blood cells have gotten low (anemic). The  transfusion raises the level of the red blood cells which carry oxygen, and this usually causes an energy increase.  The nurse administering the transfusion will monitor you carefully for complications. HOME CARE INSTRUCTIONS  No special instructions are needed after a transfusion. You may find your energy is better. Speak with your caregiver about any limitations on activity for underlying diseases you may have. SEEK MEDICAL CARE IF:   Your condition is not improving after your transfusion.  You develop redness or irritation at the intravenous (IV) site. SEEK IMMEDIATE MEDICAL CARE IF:  Any of the following symptoms occur over the next 12 hours:  Shaking chills.  You have a temperature by mouth above 102 F (38.9 C), not controlled by medicine.  Chest, back, or muscle pain.  People around you feel you are not acting correctly or are confused.  Shortness of breath or difficulty breathing.  Dizziness and fainting.  You get a rash or develop hives.  You have a decrease in urine output.  Your urine turns a dark color or changes to pink, red, or brown. Any of the following symptoms occur over the next 10 days:  You have a temperature by mouth above 102 F (38.9 C), not controlled by medicine.  Shortness of breath.  Weakness after normal activity.  The white part of the eye turns yellow (jaundice).  You have a decrease in the amount of urine or are urinating less often.  Your urine turns a dark color or changes to pink, red, or brown. Document Released: 01/04/2000 Document Revised: 03/31/2011 Document Reviewed: 08/23/2007 ExitCare Patient Information 2014 [place].  _______________________________________________________________________  Incentive Spirometer  An incentive spirometer is a tool that can help keep your lungs clear and active. This tool measures how well you are filling your lungs with each breath. Taking long deep breaths may help reverse or  decrease the chance of developing breathing (pulmonary) problems (especially infection) following:  A long period of time when you are unable to move or be active. BEFORE THE PROCEDURE   If the spirometer includes an indicator to show your best effort, your nurse or respiratory therapist will set it to a desired goal.  If possible, sit up straight or lean slightly forward. Try not to slouch.  Hold the incentive spirometer in an upright position. INSTRUCTIONS FOR USE  1. Sit on the edge of your bed if possible, or sit up as far as you can in bed or on a chair. 2. Hold the incentive spirometer in an upright position. 3. Breathe out normally. 4. Place the mouthpiece in your mouth and seal your lips tightly around it. 5. Breathe in slowly and as deeply as possible, raising the piston or the ball toward the top of the column. 6. Hold your breath for 3-5 seconds or for as long as possible. Allow the piston or ball to fall to the bottom of the column. 7. Remove the mouthpiece from your mouth and breathe out normally. 8. Rest for a few seconds and repeat Steps 1 through 7 at least 10 times every 1-2 hours when you are awake. Take your time and take a few normal breaths between deep breaths. 9. The spirometer may include an indicator to show your best effort. Use the indicator as a goal to work  toward during each repetition. 10. After each set of 10 deep breaths, practice coughing to be sure your lungs are clear. If you have an incision (the cut made at the time of surgery), support your incision when coughing by placing a pillow or rolled up towels firmly against it. Once you are able to get out of bed, walk around indoors and cough well. You may stop using the incentive spirometer when instructed by your caregiver.  RISKS AND COMPLICATIONS  Take your time so you do not get dizzy or light-headed.  If you are in pain, you may need to take or ask for pain medication before doing incentive spirometry.  It is harder to take a deep breath if you are having pain. AFTER USE  Rest and breathe slowly and easily.  It can be helpful to keep track of a log of your progress. Your caregiver can provide you with a simple table to help with this. If you are using the spirometer at home, follow these instructions: [place] IF:   You are having difficultly using the spirometer.  You have trouble using the spirometer as often as instructed.  Your pain medication is not giving enough relief while using the spirometer.  You develop fever of 100.5 F (38.1 C) or higher. SEEK IMMEDIATE MEDICAL CARE IF:   You cough up bloody sputum that had not been present before.  You develop fever of 102 F (38.9 C) or greater.  You develop worsening pain at or near the incision site. MAKE SURE YOU:   Understand these instructions.  Will watch your condition.  Will get help right away if you are not doing well or get worse. Document Released: 05/19/2006 Document Revised: 03/31/2011 Document Reviewed: 07/20/2006 [facility] Patient Information 2014 [place], [place].   ________________________________________________________________________

## 2017-06-29 NOTE — Progress Notes (Signed)
Labs to be Drawn DOS per [name] ,NP

## 2017-06-30 ENCOUNTER — Ambulatory Visit (HOSPITAL_COMMUNITY): Payer: Medicare Other | Admitting: Anesthesiology

## 2017-06-30 ENCOUNTER — Ambulatory Visit (HOSPITAL_COMMUNITY)
Admission: RE | Admit: 2017-06-30 | Discharge: 2017-07-01 | Disposition: A | Discharge status: Home / Self Care | Payer: Medicare Other | Source: Ambulatory Visit | Attending: Gynecologic Oncology | Admitting: Gynecologic Oncology

## 2017-06-30 ENCOUNTER — Encounter (HOSPITAL_COMMUNITY): Payer: Self-pay | Admitting: Anesthesiology

## 2017-06-30 ENCOUNTER — Encounter: Payer: Self-pay | Admitting: Oncology

## 2017-06-30 ENCOUNTER — Encounter (HOSPITAL_COMMUNITY)
Admission: RE | Disposition: A | Discharge status: Home / Self Care | Payer: Self-pay | Source: Ambulatory Visit | Attending: Gynecologic Oncology

## 2017-06-30 DIAGNOSIS — N735 Female pelvic peritonitis, unspecified: Secondary | ICD-10-CM | POA: Insufficient documentation

## 2017-06-30 DIAGNOSIS — Z7984 Long term (current) use of oral hypoglycemic drugs: Secondary | ICD-10-CM | POA: Insufficient documentation

## 2017-06-30 DIAGNOSIS — D569 Thalassemia, unspecified: Secondary | ICD-10-CM

## 2017-06-30 DIAGNOSIS — C569 Malignant neoplasm of unspecified ovary: Secondary | ICD-10-CM

## 2017-06-30 DIAGNOSIS — E119 Type 2 diabetes mellitus without complications: Secondary | ICD-10-CM | POA: Insufficient documentation

## 2017-06-30 DIAGNOSIS — R188 Other ascites: Secondary | ICD-10-CM | POA: Insufficient documentation

## 2017-06-30 DIAGNOSIS — F17210 Nicotine dependence, cigarettes, uncomplicated: Secondary | ICD-10-CM | POA: Insufficient documentation

## 2017-06-30 DIAGNOSIS — F0390 Unspecified dementia without behavioral disturbance: Secondary | ICD-10-CM | POA: Insufficient documentation

## 2017-06-30 DIAGNOSIS — F209 Schizophrenia, unspecified: Secondary | ICD-10-CM | POA: Insufficient documentation

## 2017-06-30 DIAGNOSIS — C786 Secondary malignant neoplasm of retroperitoneum and peritoneum: Secondary | ICD-10-CM

## 2017-06-30 DIAGNOSIS — I10 Essential (primary) hypertension: Secondary | ICD-10-CM | POA: Insufficient documentation

## 2017-06-30 DIAGNOSIS — R1900 Intra-abdominal and pelvic swelling, mass and lump, unspecified site: Secondary | ICD-10-CM | POA: Diagnosis present

## 2017-06-30 DIAGNOSIS — Z01812 Encounter for preprocedural laboratory examination: Secondary | ICD-10-CM | POA: Insufficient documentation

## 2017-06-30 DIAGNOSIS — C562 Malignant neoplasm of left ovary: Secondary | ICD-10-CM | POA: Diagnosis not present

## 2017-06-30 DIAGNOSIS — N838 Other noninflammatory disorders of ovary, fallopian tube and broad ligament: Secondary | ICD-10-CM

## 2017-06-30 DIAGNOSIS — F200 Paranoid schizophrenia: Secondary | ICD-10-CM | POA: Insufficient documentation

## 2017-06-30 DIAGNOSIS — C7989 Secondary malignant neoplasm of other specified sites: Secondary | ICD-10-CM | POA: Insufficient documentation

## 2017-06-30 DIAGNOSIS — Z79899 Other long term (current) drug therapy: Secondary | ICD-10-CM | POA: Insufficient documentation

## 2017-06-30 HISTORY — DX: Headache, unspecified: R51.9

## 2017-06-30 HISTORY — DX: Headache: R51

## 2017-06-30 HISTORY — DX: Dyspnea, unspecified: R06.00

## 2017-06-30 HISTORY — PX: LAPAROSCOPY: SHX197

## 2017-06-30 LAB — POCT I-STAT 4, (NA,K, GLUC, HGB,HCT)
GLUCOSE: 106 mg/dL — AB (ref 65–99)
HEMATOCRIT: 39 % (ref 36.0–46.0)
Hemoglobin: 13.3 g/dL (ref 12.0–15.0)
Potassium: 3.4 mmol/L — ABNORMAL LOW (ref 3.5–5.1)
Sodium: 142 mmol/L (ref 135–145)

## 2017-06-30 LAB — TYPE AND SCREEN
ABO/RH(D): O POS
Antibody Screen: NEGATIVE

## 2017-06-30 LAB — GLUCOSE, CAPILLARY
Glucose-Capillary: 89 mg/dL (ref 65–99)
Glucose-Capillary: 139 mg/dL — ABNORMAL HIGH (ref 65–99)
Glucose-Capillary: 222 mg/dL — ABNORMAL HIGH (ref 65–99)
Glucose-Capillary: 258 mg/dL — ABNORMAL HIGH (ref 65–99)

## 2017-06-30 LAB — ABO/RH: ABO/RH(D): O POS

## 2017-06-30 SURGERY — LAPAROSCOPY, DIAGNOSTIC
Anesthesia: General

## 2017-06-30 MED ORDER — DEXAMETHASONE SODIUM PHOSPHATE 10 MG/ML IJ SOLN
INTRAMUSCULAR | Status: DC | PRN
  Administered 2017-06-30: 10 mg via INTRAVENOUS

## 2017-06-30 MED ORDER — BUPIVACAINE HCL (PF) 0.25 % IJ SOLN
INTRAMUSCULAR | Status: AC
  Filled 2017-06-30: qty 30

## 2017-06-30 MED ORDER — ACETAMINOPHEN 500 MG PO TABS
1000.0000 mg | ORAL_TABLET | ORAL | Status: AC
  Administered 2017-06-30: 1000 mg via ORAL
  Filled 2017-06-30: qty 2

## 2017-06-30 MED ORDER — INSULIN ASPART 100 UNIT/ML ~~LOC~~ SOLN: 0.0000 [IU] | Freq: Three times a day (TID) | SUBCUTANEOUS | Status: DC

## 2017-06-30 MED ORDER — METFORMIN HCL 500 MG PO TABS
500.0000 mg | ORAL_TABLET | Freq: Two times a day (BID) | ORAL | Status: DC
  Administered 2017-06-30 – 2017-07-01 (×2): 500 mg via ORAL
  Filled 2017-06-30 (×2): qty 1

## 2017-06-30 MED ORDER — FENTANYL CITRATE (PF) 250 MCG/5ML IJ SOLN
INTRAMUSCULAR | Status: DC | PRN
  Administered 2017-06-30: 100 ug via INTRAVENOUS
  Administered 2017-06-30 (×2): 50 ug via INTRAVENOUS

## 2017-06-30 MED ORDER — LISINOPRIL 10 MG PO TABS
10.0000 mg | ORAL_TABLET | Freq: Every day | ORAL | Status: DC
  Administered 2017-07-01: 10 mg via ORAL
  Filled 2017-06-30: qty 1

## 2017-06-30 MED ORDER — PROMETHAZINE HCL 25 MG/ML IJ SOLN: 6.2500 mg | INTRAMUSCULAR | Status: DC | PRN

## 2017-06-30 MED ORDER — MIDAZOLAM HCL 2 MG/2ML IJ SOLN
INTRAMUSCULAR | Status: AC
  Filled 2017-06-30: qty 2

## 2017-06-30 MED ORDER — PROPOFOL 10 MG/ML IV BOLUS
INTRAVENOUS | Status: AC
  Filled 2017-06-30: qty 20

## 2017-06-30 MED ORDER — SODIUM CHLORIDE 0.9 % IV SOLN: Freq: Once | INTRAVENOUS | Status: DC

## 2017-06-30 MED ORDER — DEXAMETHASONE SODIUM PHOSPHATE 4 MG/ML IJ SOLN: 4.0000 mg | INTRAMUSCULAR | Status: DC

## 2017-06-30 MED ORDER — FENTANYL CITRATE (PF) 250 MCG/5ML IJ SOLN
INTRAMUSCULAR | Status: AC
  Filled 2017-06-30: qty 5

## 2017-06-30 MED ORDER — BENZTROPINE MESYLATE 0.5 MG PO TABS
0.5000 mg | ORAL_TABLET | Freq: Two times a day (BID) | ORAL | Status: DC
  Administered 2017-06-30 – 2017-07-01 (×2): 0.5 mg via ORAL
  Filled 2017-06-30 (×2): qty 1

## 2017-06-30 MED ORDER — HYDROMORPHONE HCL 1 MG/ML IJ SOLN
INTRAMUSCULAR | Status: AC
  Filled 2017-06-30: qty 1

## 2017-06-30 MED ORDER — BUPIVACAINE HCL (PF) 0.25 % IJ SOLN
INTRAMUSCULAR | Status: DC | PRN
  Administered 2017-06-30: 10 mL

## 2017-06-30 MED ORDER — PHENYLEPHRINE 40 MCG/ML (10ML) SYRINGE FOR IV PUSH (FOR BLOOD PRESSURE SUPPORT)
PREFILLED_SYRINGE | INTRAVENOUS | Status: DC | PRN
  Administered 2017-06-30: 80 ug via INTRAVENOUS

## 2017-06-30 MED ORDER — AMLODIPINE BESYLATE 5 MG PO TABS
5.0000 mg | ORAL_TABLET | Freq: Every day | ORAL | Status: DC
  Administered 2017-07-01: 5 mg via ORAL
  Filled 2017-06-30: qty 1

## 2017-06-30 MED ORDER — ROCURONIUM BROMIDE 10 MG/ML (PF) SYRINGE
PREFILLED_SYRINGE | INTRAVENOUS | Status: AC
  Filled 2017-06-30: qty 5

## 2017-06-30 MED ORDER — ONDANSETRON HCL 4 MG PO TABS: 4.0000 mg | ORAL_TABLET | Freq: Four times a day (QID) | ORAL | Status: DC | PRN

## 2017-06-30 MED ORDER — SUGAMMADEX SODIUM 200 MG/2ML IV SOLN
INTRAVENOUS | Status: DC | PRN
  Administered 2017-06-30: 200 mg via INTRAVENOUS

## 2017-06-30 MED ORDER — INSULIN ASPART 100 UNIT/ML ~~LOC~~ SOLN
0.0000 [IU] | Freq: Three times a day (TID) | SUBCUTANEOUS | Status: DC
  Administered 2017-06-30: 3 [IU] via SUBCUTANEOUS
  Administered 2017-07-01: 1 [IU] via SUBCUTANEOUS

## 2017-06-30 MED ORDER — CELECOXIB 200 MG PO CAPS
400.0000 mg | ORAL_CAPSULE | ORAL | Status: AC
  Administered 2017-06-30: 400 mg via ORAL
  Filled 2017-06-30: qty 2

## 2017-06-30 MED ORDER — DEXAMETHASONE SODIUM PHOSPHATE 10 MG/ML IJ SOLN
INTRAMUSCULAR | Status: AC
  Filled 2017-06-30: qty 1

## 2017-06-30 MED ORDER — TRAZODONE HCL 50 MG PO TABS
50.0000 mg | ORAL_TABLET | Freq: Every day | ORAL | Status: DC
  Administered 2017-06-30: 50 mg via ORAL
  Filled 2017-06-30: qty 1

## 2017-06-30 MED ORDER — LACTATED RINGERS IV SOLN
INTRAVENOUS | Status: DC | PRN
  Administered 2017-06-30: 07:00:00 via INTRAVENOUS

## 2017-06-30 MED ORDER — ENOXAPARIN SODIUM 40 MG/0.4ML ~~LOC~~ SOLN
40.0000 mg | SUBCUTANEOUS | Status: DC
  Administered 2017-07-01: 40 mg via SUBCUTANEOUS
  Filled 2017-06-30: qty 0.4

## 2017-06-30 MED ORDER — HYDROMORPHONE HCL 1 MG/ML IJ SOLN
0.2500 mg | INTRAMUSCULAR | Status: DC | PRN
  Administered 2017-06-30 (×2): 0.5 mg via INTRAVENOUS

## 2017-06-30 MED ORDER — BUPROPION HCL ER (XL) 150 MG PO TB24
150.0000 mg | ORAL_TABLET | Freq: Every day | ORAL | Status: DC
  Administered 2017-07-01: 150 mg via ORAL
  Filled 2017-06-30: qty 1

## 2017-06-30 MED ORDER — GABAPENTIN 300 MG PO CAPS
300.0000 mg | ORAL_CAPSULE | ORAL | Status: AC
  Administered 2017-06-30: 300 mg via ORAL
  Filled 2017-06-30: qty 1

## 2017-06-30 MED ORDER — ROCURONIUM BROMIDE 10 MG/ML (PF) SYRINGE
PREFILLED_SYRINGE | INTRAVENOUS | Status: DC | PRN
  Administered 2017-06-30: 50 mg via INTRAVENOUS

## 2017-06-30 MED ORDER — PALIPERIDONE ER 6 MG PO TB24
6.0000 mg | ORAL_TABLET | Freq: Every day | ORAL | Status: DC
  Administered 2017-07-01: 6 mg via ORAL
  Filled 2017-06-30: qty 1

## 2017-06-30 MED ORDER — KETOROLAC TROMETHAMINE 30 MG/ML IJ SOLN
30.0000 mg | Freq: Once | INTRAMUSCULAR | Status: AC | PRN
  Administered 2017-06-30: 30 mg via INTRAVENOUS

## 2017-06-30 MED ORDER — PHENYLEPHRINE 40 MCG/ML (10ML) SYRINGE FOR IV PUSH (FOR BLOOD PRESSURE SUPPORT)
PREFILLED_SYRINGE | INTRAVENOUS | Status: AC
  Filled 2017-06-30: qty 10

## 2017-06-30 MED ORDER — ONDANSETRON HCL 4 MG/2ML IJ SOLN
INTRAMUSCULAR | Status: AC
  Filled 2017-06-30: qty 2

## 2017-06-30 MED ORDER — ONDANSETRON HCL 4 MG/2ML IJ SOLN: 4.0000 mg | Freq: Four times a day (QID) | INTRAMUSCULAR | Status: DC | PRN

## 2017-06-30 MED ORDER — TRAMADOL HCL 50 MG PO TABS: 100.0000 mg | ORAL_TABLET | Freq: Two times a day (BID) | ORAL | Status: DC | PRN

## 2017-06-30 MED ORDER — VITAMIN B-1 100 MG PO TABS
100.0000 mg | ORAL_TABLET | Freq: Every day | ORAL | Status: DC
  Administered 2017-07-01: 100 mg via ORAL
  Filled 2017-06-30: qty 1

## 2017-06-30 MED ORDER — SCOPOLAMINE 1 MG/3DAYS TD PT72
1.0000 | MEDICATED_PATCH | TRANSDERMAL | Status: DC
  Administered 2017-06-30: 1.5 mg via TRANSDERMAL
  Filled 2017-06-30: qty 1

## 2017-06-30 MED ORDER — MEMANTINE HCL 10 MG PO TABS
5.0000 mg | ORAL_TABLET | Freq: Two times a day (BID) | ORAL | Status: DC
  Administered 2017-06-30 – 2017-07-01 (×3): 5 mg via ORAL
  Filled 2017-06-30 (×3): qty 1

## 2017-06-30 MED ORDER — KCL IN DEXTROSE-NACL 20-5-0.45 MEQ/L-%-% IV SOLN
INTRAVENOUS | Status: DC
  Administered 2017-06-30: 11:00:00 via INTRAVENOUS
  Filled 2017-06-30 (×2): qty 1000

## 2017-06-30 MED ORDER — ACETAMINOPHEN 500 MG PO TABS
1000.0000 mg | ORAL_TABLET | Freq: Two times a day (BID) | ORAL | Status: DC
  Administered 2017-06-30 – 2017-07-01 (×2): 1000 mg via ORAL
  Filled 2017-06-30 (×2): qty 2

## 2017-06-30 MED ORDER — SUGAMMADEX SODIUM 200 MG/2ML IV SOLN
INTRAVENOUS | Status: AC
  Filled 2017-06-30: qty 2

## 2017-06-30 MED ORDER — LIDOCAINE 2% (20 MG/ML) 5 ML SYRINGE
INTRAMUSCULAR | Status: DC | PRN
  Administered 2017-06-30: 100 mg via INTRAVENOUS

## 2017-06-30 MED ORDER — ONDANSETRON HCL 4 MG/2ML IJ SOLN
INTRAMUSCULAR | Status: DC | PRN
  Administered 2017-06-30: 4 mg via INTRAVENOUS

## 2017-06-30 MED ORDER — LIDOCAINE 2% (20 MG/ML) 5 ML SYRINGE
INTRAMUSCULAR | Status: AC
  Filled 2017-06-30: qty 5

## 2017-06-30 MED ORDER — PROPOFOL 10 MG/ML IV BOLUS
INTRAVENOUS | Status: DC | PRN
  Administered 2017-06-30: 120 mg via INTRAVENOUS

## 2017-06-30 MED ORDER — KETOROLAC TROMETHAMINE 30 MG/ML IJ SOLN
INTRAMUSCULAR | Status: AC
  Filled 2017-06-30: qty 1

## 2017-06-30 MED ORDER — DIAZEPAM 1 MG/ML PO SOLN
5.0000 mg | Freq: Once | ORAL | Status: AC
  Administered 2017-06-30: 5 mg via ORAL
  Filled 2017-06-30: qty 5

## 2017-06-30 SURGICAL SUPPLY — 41 items
ADH SKN CLS APL DERMABOND .7 (GAUZE/BANDAGES/DRESSINGS) ×1
BAG SPEC RTRVL LRG 6X4 10 (ENDOMECHANICALS)
CABLE HIGH FREQUENCY MONO STRZ (ELECTRODE) IMPLANT
CHLORAPREP W/TINT 26ML (MISCELLANEOUS) ×2 IMPLANT
CONT SPEC 4OZ CLIKSEAL STRL BL (MISCELLANEOUS) IMPLANT
COVER SURGICAL LIGHT HANDLE (MISCELLANEOUS) ×2 IMPLANT
DECANTER SPIKE VIAL GLASS SM (MISCELLANEOUS) ×1 IMPLANT
DERMABOND ADVANCED (GAUZE/BANDAGES/DRESSINGS) ×1
DERMABOND ADVANCED .7 DNX12 (GAUZE/BANDAGES/DRESSINGS) ×1 IMPLANT
DRAPE SURG IRRIG POUCH 19X23 (DRAPES) ×1 IMPLANT
ELECT REM PT RETURN 15FT ADLT (MISCELLANEOUS) ×2 IMPLANT
GLOVE BIO SURGEON STRL SZ 6 (GLOVE) ×4 IMPLANT
GLOVE BIO SURGEON STRL SZ 6.5 (GLOVE) ×3 IMPLANT
GOWN STRL REUS W/ TWL LRG LVL3 (GOWN DISPOSABLE) ×2 IMPLANT
GOWN STRL REUS W/TWL LRG LVL3 (GOWN DISPOSABLE) ×4
HOLDER FOLEY CATH W/STRAP (MISCELLANEOUS) IMPLANT
IRRIG SUCT STRYKERFLOW 2 WTIP (MISCELLANEOUS) ×2
IRRIGATION SUCT STRKRFLW 2 WTP (MISCELLANEOUS) IMPLANT
KIT BASIN OR (CUSTOM PROCEDURE TRAY) ×2 IMPLANT
MANIPULATOR UTERINE 4.5 ZUMI (MISCELLANEOUS) IMPLANT
PAD POSITIONING PINK XL (MISCELLANEOUS) ×1 IMPLANT
POSITIONER SURGICAL ARM (MISCELLANEOUS) IMPLANT
POUCH SPECIMEN RETRIEVAL 10MM (ENDOMECHANICALS) IMPLANT
SCISSORS LAP 5X35 DISP (ENDOMECHANICALS) IMPLANT
SEALER TISSUE G2 CVD JAW 45CM (ENDOMECHANICALS) ×1 IMPLANT
SHEET LAVH (DRAPES) ×2 IMPLANT
SLEEVE XCEL OPT CAN 5 100 (ENDOMECHANICALS) ×2 IMPLANT
SUT MNCRL AB 4-0 PS2 18 (SUTURE) ×2 IMPLANT
SUT VIC AB 3-0 PS2 18 (SUTURE)
SUT VIC AB 3-0 PS2 18XBRD (SUTURE) IMPLANT
SYS RETRIEVAL 5MM INZII UNIV (BASKET) ×2
SYSTEM RETRIEVL 5MM INZII UNIV (BASKET) IMPLANT
TAPE CLOTH 4X10 WHT NS (GAUZE/BANDAGES/DRESSINGS) IMPLANT
TOWEL OR 17X26 10 PK STRL BLUE (TOWEL DISPOSABLE) ×2 IMPLANT
TOWEL OR NON WOVEN STRL DISP B (DISPOSABLE) ×2 IMPLANT
TRAY FOLEY MTR SLVR 16FR STAT (SET/KITS/TRAYS/PACK) ×2 IMPLANT
TRAY LAPAROSCOPIC (CUSTOM PROCEDURE TRAY) ×2 IMPLANT
TROCAR XCEL 12X100 BLDLESS (ENDOMECHANICALS) ×2 IMPLANT
TROCAR XCEL BLUNT TIP 100MML (ENDOMECHANICALS) IMPLANT
TUBING INSUF HEATED (TUBING) IMPLANT
TUBING NON-CON 1/4 X 20 CONN (TUBING) IMPLANT

## 2017-06-30 NOTE — Op Note (Signed)
OPERATIVE REPORT 06/30/17  Surgeon: Donaciano Eva   Assistants: Lahoma Crocker MD (an MD assistant was necessary for tissue manipulation, management of robotic instrumentation, retraction and positioning due to the complexity of the case and hospital policies).   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 Omental nodularity consistent with metastatic disease. SLightly enlarged left ovary with excrescences and nodularity consistent with carcinoma. Carpeting of nodularity in posterior cul de sac, sigmoid colon adherent to left ovarian mass.  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      Procedure Details  The patient was seen in the Holding Room. The risks, benefits, complications, treatment options, and expected outcomes were discussed with the patient.  The patient concurred with the proposed plan, giving informed consent.  The site of surgery properly noted/marked. The patient was identified as Kristen Colon and the procedure verified as a diagnostic laparoscopy, peritoneal biopsies. A Time Out was held and the above information confirmed.  After induction of anesthesia, the patient was draped and prepped in the usual sterile manner. Pt was placed in supine position after anesthesia and draped and prepped in the usual sterile manner. The abdominal drape  was placed after the CholoraPrep had been allowed to dry for 3 minutes.  Her arms were tucked to her side with all appropriate precautions.  The patient was placed in the semi-lithotomy position in [place].  The perineum was prepped with Betadine.  Foley catheter was placed. OG tube placement was confirmed and to suction.     Procedure:  Quarter percent marcaine was injected into all incision sites prior to making them with an 11 blade scalpel. A 5 mm skin incision was made in the left upper quadrant.  The 5 mm Optiview port and scope was used for direct entry.  Opening pressure was under 10 mm CO2.  The abdomen was insufflated and the findings were noted as above.   At this point and all points during the procedure, the patient's intra-abdominal pressure did not exceed 15 mmHg. The patient was placed in steep trendelenberg to visualize pelvic anatomy. Next, a 5 mm skin incision was made in the upper left quadrant and a 5 mm disposable port was placed through this intraperitoneally under direct visualization.  A 67mm incision was made in the right mid upper abdomen.   Ascites was aspirated. Photographs taken (see epic).  The biopsy forcep was used to take representative biopsies from anterior pelvic peritoneum, right diaphragm, left ovary, and the enseal bipolar sealing device was utilized to excise a 2x1cm portion of tissue from the infracolic omentum with care to avoid the transverse colon.   At this point in the procedure was completed.  Ports were removed under direct visualization. All entry sites were hemostatic. CO2 insufflation was removed from the patient's abdomen.   The skin was closed with 4-0 Vicryl in a subcuticular manner.  Dermabond was applied to the incisions.  Sponge, lap and needle counts correct x 2.  The foley was removed. The patient was  taken to the recovery room in stable condition. All instrument and needle counts were correct x  3.   The patient's daughter had left the  hospital and was not available for consultation and discussion, despite being informed of procedure duration and my plan to discuss findings with her after the procedure.  The patient was transferred to the recovery room in a stable condition.  Estimated Blood Loss:  Minimal      Total IV Fluids: 400 ml         Specimens: see above         Complications:  None; patient tolerated the procedure well.         Disposition: PACU - hemodynamically stable.  Thereasa Solo, MD

## 2017-06-30 NOTE — Progress Notes (Signed)
When I evaluated the patient this morning, upon walking in the room, the patient's daughter stated "here's the doctor of the hour, who needs a whooping."  I stated that to Ms Carcamo's daughter that this threatening language is not appropriate.  She informed me that she was "just playing" and she was saying that because I had not agreed to admitting her preoperatively so that her daughter could "get some rest".   As there was no medically necessary reason for admission, this was not agreed to. We will monitor the patient in house overnight due to her underlying dementia/schizophrenia.   I also explained to the patient's daughter that her witnessed behavior in the cancer center yesterday in which she was yelling at my staff in the waiting area was not appropriate. The patient's daughter informed me that this did not happen and she did not yell and was not upset (which is a statement in opposition to what she had told me earlier in this conversation about how she was upset with me for not admitting her mother yesterday).  Thereasa Solo, MD

## 2017-06-30 NOTE — Anesthesia Postprocedure Evaluation (Signed)
Anesthesia Post Note  Patient: Kristen Colon  Procedure(s) Performed: LAPAROSCOPY DIAGNOSTIC WITH BIOPSIES (N/A )     Patient location during evaluation: PACU Anesthesia Type: General Level of consciousness: awake and alert Pain management: pain level controlled Vital Signs Assessment: post-procedure vital signs reviewed and stable Respiratory status: spontaneous breathing, nonlabored ventilation, respiratory function stable and patient connected to nasal cannula oxygen Cardiovascular status: blood pressure returned to baseline and stable Postop Assessment: no apparent nausea or vomiting Anesthetic complications: no    Last Vitals:  Vitals:   06/30/17 1155 06/30/17 1307  BP: (!) 146/81 113/81  Pulse: 88 96  Resp:    Temp: 36.4 C 36.5 C  SpO2: 94% (!) 89%    Last Pain:  Vitals:   06/30/17 1307  TempSrc: Oral  PainSc:                  Abella Shugart S

## 2017-06-30 NOTE — Transfer of Care (Signed)
Immediate Anesthesia Transfer of Care Note  Patient: Kristen Colon  Procedure(s) Performed: LAPAROSCOPY DIAGNOSTIC WITH BIOPSIES (N/A )  Patient Location: PACU  Anesthesia Type:General  Level of Consciousness: awake  Airway & Oxygen Therapy: Patient Spontanous Breathing and Patient connected to face mask oxygen  Post-op Assessment: Report given to RN and Post -op Vital signs reviewed and stable  Post vital signs: Reviewed and stable  Last Vitals:  Vitals Value Taken Time  BP 135/87 06/30/2017  8:51 AM  Temp    Pulse 82 06/30/2017  8:53 AM  Resp 15 06/30/2017  8:53 AM  SpO2 100 % 06/30/2017  8:53 AM  Vitals shown include unvalidated device data.  Last Pain:  Vitals:   06/30/17 0625  TempSrc: Oral  PainSc: 0-No pain         Complications: No apparent anesthesia complications

## 2017-06-30 NOTE — Anesthesia Preprocedure Evaluation (Signed)
Anesthesia Evaluation  Patient identified by MRN, date of birth, ID band Patient awake    Reviewed: Allergy & Precautions, NPO status , Patient's Chart, lab work & pertinent test results  Airway Mallampati: II  TM Distance: >3 FB Neck ROM: Full    Dental no notable dental hx.    Pulmonary Current Smoker,    Pulmonary exam normal breath sounds clear to auscultation       Cardiovascular hypertension, Normal cardiovascular exam Rhythm:Regular Rate:Normal     Neuro/Psych Schizophrenia Dementia negative neurological ROS     GI/Hepatic negative GI ROS, Neg liver ROS,   Endo/Other  diabetes  Renal/GU negative Renal ROS  negative genitourinary   Musculoskeletal negative musculoskeletal ROS (+)   Abdominal   Peds negative pediatric ROS (+)  Hematology negative hematology ROS (+)   Anesthesia Other Findings   Reproductive/Obstetrics negative OB ROS                             Anesthesia Physical Anesthesia Plan  ASA: III  Anesthesia Plan: General   Post-op Pain Management:    Induction: Intravenous  PONV Risk Score and Plan: 2 and Ondansetron, Dexamethasone and Treatment may vary due to age or medical condition  Airway Management Planned: Oral ETT  Additional Equipment:   Intra-op Plan:   Post-operative Plan: Extubation in OR  Informed Consent: I have reviewed the patients History and Physical, chart, labs and discussed the procedure including the risks, benefits and alternatives for the proposed anesthesia with the patient or authorized representative who has indicated his/her understanding and acceptance.   Dental advisory given  Plan Discussed with: CRNA and Surgeon  Anesthesia Plan Comments:         Anesthesia Quick Evaluation

## 2017-06-30 NOTE — H&P (Signed)
H&P Note: Gyn-Onc  Consult was initially requested by Dr. Tana Coast for the evaluation of Kristen Colon 67 y.o. female  CC:     Chief Complaint  Patient presents with  . Hypertension    Assessment/Plan:  Kristen Colon  is a 67 y.o.  year old with a 14cm mosty simple, fluid filled cyst that appears to be arising from the right ovary in addition to a solid appearing left 5cm ovarian mass, small volume ascites, omental nodularity and an elevated CA 125 tumor marker. 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 Percutaneous biopsy was negative. However I am concerned this was a false negative result.  The patient has profound dementia and paranoid schizophrenia. She is not able to make treatment decisions (her daughter is power of attorney) and is somewhat combative about treatment proposals.   I am recommending diagnostic laparoscopy with omental biopsy. I discussed this with her daughter and explained the risks of the procedure. I discussed that if metastatic ovarian cancer is diagnosed, it would require treatment with chemotherapy and I do not know if her mother would be able to tolerate/agree to such treatment, therefore, we may have to consider the option of hospice and palliative care.   Surgery was scheduled for 06/04/17 however this was delayed per the patient's daughter's request while she attempted to secure an insurance policy to cover her mother.  HPI: Kristen Colon is a 67 year old P17 who is seen in consultation at the request of Dr Tana Coast for  13cm right ovarian cyst.  The patient has schizophrenia, dementia (of unknown etiology), HTN and alcohol abuse. She had been hospitalized in July 2018 for inpatient detoxification. She was then discharged to home on antabuse. Her daughter reports that she discovered that the caregiver she had hired at home had actually provided her mother with alcohol while she was using antabuse.  She had been  recently determined to have dementia, but had not yet had a full workup of the etiology.  She developed symptoms of altered mental status, nausea and emesis on 09/18/16 and her PCP recommended she be sent to the hospital where she was admitted to [facility]. She was noted to be very hypertensive.  CT abdo/pelvis on 09/19/16 showed Interval increase in size of a large right adnexal cystic mass measuring 10.4 x 13.4 cm, previously 10 cm. Mass-effect with displacement of bowel loops toward the periphery. Enlarged left ovary with multiple low-density lesions. Uterus contains calcified fibroids. The cyst was seen on 04/24/14 on a CT abdo/pelvis.  The cyst is regular in appearance with a single septation but is mostly fluid filled with slight thickening on the inferior cortex. It appears benign.  Interval Hx:  On 04/13/17 she underwent a CT abd/pelvis during a psych admission (as they had seen my plan for follow-up of the mass with repeat imaging). It revealed:  Definitive omental metastatic disease and additional scattered pathologic omental nodularity throughout the bilateral abdomen. Small volume associated ascites. No bowel obstruction. Occluded left common iliac artery with reconstituted enhancement in the left external and internal iliac. Advanced Aortoiliac calcified atherosclerosis. The other major arterial structures in the abdomen and pelvis remain patent. No discrete lymphadenopathy in the abdomen or pelvis. Mild additional enlargement of the chronic large predominantly cystic pelvic mass thought to arise from the right ovary. Today this is 11.4 x 14.1 x 14.5 centimeters (AP by transverse by CC) versus 10.4 x 13.4 by 13.4 centimeters previously. There is also been  enlargement of the heterogeneous and indistinct left ovary which now measures up to 5 centimeters diameter (previously 4.3 centimeters). The uterus appears diminutive.   CA 125 on 04/13/17 was 513  Given these findings being concerning for  metastatic ovarian cancer she underwent an IR percutaneous omental biopsy on 04/17/17 which revealed only benign smooth muscle and fatty tissue.  The patient's dementia is progressive. She lives with her daughter. Her daughter has medical POA. She cannot make decisions for herself. She states she has no complaints but her daughter confirms abdominal distension and worsenting hyperglycemia.    Current Facility-Administered Medications:  .  dexamethasone (DECADRON) injection 4 mg, 4 mg, Intravenous, On Call to OR, Cross, Melissa D, NP .  scopolamine (TRANSDERM-SCOP) 1 MG/3DAYS 1.5 mg, 1 patch, Transdermal, On Call to OR, Cross, Melissa D, NP, 1.5 mg at 06/30/17 7035   Allergy: No Known Allergies  Social Hx:   Social History        Social History  . Marital status: Divorced    Spouse name: N/A  . Number of children: N/A  . Years of education: N/A      Occupational History  . Not on file.         Social History Main Topics  . Smoking status: Current Every Day Smoker    Packs/day: 0.20    Types: Cigarettes  . Smokeless tobacco: Never Used  . Alcohol use Yes  . Drug use: Yes    Types: Marijuana  . Sexual activity: Not on file     Comment: quit 5 years ago       Other Topics Concern  . Not on file      Social History Narrative  . No narrative on file    Past Surgical Hx: History reviewed. No pertinent surgical history.  Past Medical Hx:      Past Medical History:  Diagnosis Date  . Dementia   . Diabetes mellitus   . Hypertension   . Schizophrenia [facility])     Past Gynecological History:  Patient reports 10 SVD's No LMP recorded. Patient is postmenopausal. last menstrual period age 14  Family Hx:       Family History  Problem Relation Age of Onset  . Heart disease Mother   . Diabetes Sister     Review of Systems:  Constitutional  Feels well,    ENT Normal appearing ears and nares bilaterally Skin/Breast  No rash, sores,  jaundice, itching, dryness Cardiovascular  No chest pain, shortness of breath, or edema  Pulmonary  No cough or wheeze.  Gastro Intestinal  + nausea and emesis prior to admission, now none of these symptoms  Genito Urinary  No frequency, urgency, dysuria, no bleeding Musculo Skeletal  No myalgia, arthralgia, joint swelling or pain  Neurologic  No weakness, numbness, change in gait,  Psychology  + confusion  Vitals:  Blood pressure 125/68, pulse 82, temperature 98 F (36.7 C), temperature source Oral, resp. rate 17, height 5\' 5"  (1.651 m), weight 126 lb 8 oz (57.4 kg), SpO2 100 %.  Physical Exam: WD in NAD Neck  Supple NROM, without any enlargements.  Lymph Node Survey No cervical supraclavicular or inguinal adenopathy Cardiovascular  Pulse normal rate, regularity and rhythm. S1 and S2 normal.  Lungs  Clear to auscultation bilateraly, without wheezes/crackles/rhonchi. Good air movement.  Skin  No rash/lesions/breakdown  Psychiatry  Alert and oriented to person, place, and time  Abdomen  Normoactive bowel sounds, abdomen soft, non-tender and nonobese without evidence  of hernia. There is a cystic fullness to the lower abdomen. Back No CVA tenderness Genito Urinary  Normal external genitalia. Normal cervix and uterus. Soft, mobile cystic mass fills pelvis into lower abdomen, non tender. Rectal  deferred Extremities  No bilateral cyanosis, clubbing or edema.   Thereasa Solo, MD

## 2017-06-30 NOTE — Plan of Care (Signed)
Plan of care discussed with patient 

## 2017-06-30 NOTE — Anesthesia Procedure Notes (Signed)
Procedure Name: Intubation Date/Time: 06/30/2017 7:47 AM Performed by: Sharlette Dense, CRNA Patient Re-evaluated:Patient Re-evaluated prior to induction Oxygen Delivery Method: Circle system utilized Preoxygenation: Pre-oxygenation with 100% oxygen Induction Type: IV induction Ventilation: Mask ventilation without difficulty and Oral airway inserted - appropriate to patient size Laryngoscope Size: Sabra Heck and 2 Grade View: Grade I Tube type: Oral Tube size: 7.5 mm Number of attempts: 1 Airway Equipment and Method: Stylet Placement Confirmation: ETT inserted through vocal cords under direct vision,  positive ETCO2 and breath sounds checked- equal and bilateral Secured at: 21 cm Tube secured with: Tape Dental Injury: Teeth and Oropharynx as per pre-operative assessment

## 2017-07-01 ENCOUNTER — Telehealth: Payer: Self-pay | Admitting: Oncology

## 2017-07-01 ENCOUNTER — Encounter (HOSPITAL_COMMUNITY): Payer: Self-pay | Admitting: Gynecologic Oncology

## 2017-07-01 DIAGNOSIS — F0390 Unspecified dementia without behavioral disturbance: Secondary | ICD-10-CM

## 2017-07-01 DIAGNOSIS — N839 Noninflammatory disorder of ovary, fallopian tube and broad ligament, unspecified: Secondary | ICD-10-CM | POA: Diagnosis not present

## 2017-07-01 DIAGNOSIS — F209 Schizophrenia, unspecified: Secondary | ICD-10-CM | POA: Diagnosis not present

## 2017-07-01 DIAGNOSIS — Z720 Tobacco use: Secondary | ICD-10-CM

## 2017-07-01 DIAGNOSIS — C562 Malignant neoplasm of left ovary: Secondary | ICD-10-CM | POA: Diagnosis not present

## 2017-07-01 LAB — GLUCOSE, CAPILLARY
Glucose-Capillary: 125 mg/dL — ABNORMAL HIGH (ref 65–99)
Glucose-Capillary: 79 mg/dL (ref 65–99)

## 2017-07-01 MED ORDER — ACETAMINOPHEN 500 MG PO TABS: 1000.0000 mg | ORAL_TABLET | Freq: Two times a day (BID) | ORAL | 0 refills | Status: DC

## 2017-07-01 NOTE — Consult Note (Signed)
[facility] CONSULT NOTE  Patient Care Team: Kristen Beals, NP as PCP - General  ASSESSMENT & PLAN:  Possible locally advanced ovarian cancer, biopsy pending Dementia  Schizophrenia  I spoke with the patient's daughter The patient's daughter, Kristen Colon is very angry at the suggestion that her mother is not able to verbalize understanding of her current disease state.   I told her that I am concerned about toxicity of the chemotherapy and inability to prescribe chemotherapy safety. I told her that patient's safety is my priority.  In order to prescribe chemotherapy safely, the patient needs to be able to verbalize understanding of her disease status, the risk and benefits of treatment and to be able to verbalize toxicity and side effects. This is not the case with this patient.  Her daughter then became very angry over the phone and raised her voice.  She have requested my name and will take action against my "unprofessionalism" for not recommending chemotherapy.  I could not rationalize with her daughter.  She ultimately terminated the telephone call. I have spoken with the primary team about the status of the current consultation. In my opinion, I think involvement of palliative care service is appropriate but given the patient's daughter anger, I  do not know if she will be willing to accept palliative care because her daughter felt that "every cancer patient should receive chemotherapy" and felt that my clinical assessment is wrong about her mother. At this point in time, I believe that the primary service is arranging for second opinion elsewhere I have not made a return appointment for the patient  CHIEF COMPLAINTS/PURPOSE OF CONSULTATION:  Locally Advanced ovarian cancer, biopsy pending  HISTORY OF PRESENTING ILLNESS:  Kristen Colon is seen per request by Kristen Colon for consideration of neoadjuvant chemotherapy for locally advanced ovarian cancer I have  reviewed her chart and materials related to her cancer extensively and collaborated history with the patient and with her daughter. 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 Small volume ascites. Mildly enlarged and heterogeneous left ovary (up to 5 cm now).  2. New trace pleural effusions are indeterminate. No lung base nodule or mass is identified. 3. 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 Advanced Aortic Atherosclerosis (ICD10-I70.0). 4. Retained stool in redundant colon, but no bowel obstruction      04/17/2017 Procedure    Successful CT-guided core biopsy of an omental mass.      04/17/2017 Pathology Results    Omentum, biopsy - UNREMARKABLE FIBROADIPOSE TISSUE AND FRAGMENT OF SMOOTH MUSCLE. - NEGATIVE FOR MALIGNANCY.      06/30/2017 Surgery    Post-operative Diagnosis: clinical stage IIIC ovarian cancer (suspect originating from left)  Operation: Diagnostic laparoscopy, peritoneal biopsies  Surgeon: Kristen Colon  Specimens: anterior pelvic peritoneum, right diaphragm, left ovary, omentum, ascites  Operative Findings: approximately 20cm right ovarian smooth walled mass, small volume ascites, miliary studding on peritoneal surfaces on right diaphragm and peritoneum. Omental nodularity consistent with metastatic disease. SLightly enlarged left ovary with excrescences and nodularity consistent with carcinoma. Carpeting of nodularity in posterior cul de sac, sigmoid colon adherent to left ovarian mass.  Findings consistent with left ovarian cancer metastatic  to peritoneal cavity.             I spoke with the patient in the absence of her daughter.   After my assessment, I spoke with her daughter over the telephone.  The patient stated that she is having some changes in her belly with swelling in her abdomen She was told that she might have cancer When I asked her what does cancer means? The patient replied it is something that came out of a can.  She went on and say that her brother had taken stuff like that out of a can in the past. I asked her if she knows where is she located at present time.  She states she is in the hospital. When I ask about the year, she states this is 1926 She stated she has 3 boyfriend and have 12 children.  She could not remember the names of her boyfriends or children I asked her if she remembers giving birth to her children? She stated that they have taken babies out of her head and baby is out of her abdomen and they are very small when they take them out.  She gestured that the size of her baby is about the size of a soda can. 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  I asked if she lives at home with her daughter.  She replied yes.  When I asked her the name of her daughter, she replied, Kristen Colon. I asked her again later at the end of the interview and she again told me that the name of her daughter is Kristen Colon She stated that she has not drink for over 5 years but I noticed she was admitted to the hospital in September 2018 and admits to drinking at that time  The interview is terminated due to her mental status and dementia  MEDICAL HISTORY:  Past Medical History:  Diagnosis Date  . Anxiety   . Dementia   . Diabetes mellitus    type 2  . Dyspnea   . Headache   . Hypertension   . Schizophrenia ([place])    paranoid    SURGICAL HISTORY: Past Surgical History:  Procedure Laterality Date  . DIAGNOSTIC LAPAROSCOPY     pelvic mass Kristen Colon 06-30-17   . LAPAROSCOPY N/A 06/30/2017   Procedure: LAPAROSCOPY DIAGNOSTIC WITH BIOPSIES;  Surgeon: Everitt Amber, MD;  Location: [facility];  Service: Gynecology;  Laterality: N/A;    SOCIAL HISTORY: Social History   Socioeconomic History  . Marital status: Divorced    Spouse name: Not on file  . Number of children: Not on file  . Years of education: Not on file  . Highest education level: Not on file  Occupational History  . Not on file  Social Needs  . Financial resource strain: Not on file  . Food insecurity:    Worry: Not on file    Inability: Not on file  . Transportation needs:    Medical: Not on file    Non-medical: Not on file  Tobacco Use  . Smoking status: Current Every Day Smoker    Packs/day: 0.20    Types: Cigarettes  . Smokeless tobacco: Never Used  Substance and Sexual Activity  . Alcohol use: Yes  . Drug use: Not Currently    Types: Marijuana  .  Sexual activity: Not Currently    Comment: quit 5 years ago  Lifestyle  . Physical activity:    Days per week: Not on file    Minutes per session: Not on file  . Stress: Not on file  Relationships  . Social connections:    Talks on phone: Not on file    Gets together: Not on file    Attends religious service: Not on file    Active member of club or organization: Not on file    Attends meetings of clubs or organizations: Not on file    Relationship status: Not on file  . Intimate partner violence:    Fear of current or ex partner: Not on file    Emotionally abused: Not on file    Physically abused: Not on file    Forced sexual activity: Not on file  Other Topics Concern  . Not on file  Social History Narrative  . Not on file    FAMILY HISTORY: Family History  Problem Relation Age of Onset  . Heart disease Mother   . Diabetes Sister     ALLERGIES:  is allergic to hydrocodone.  MEDICATIONS:  Current Facility-Administered Medications  Medication Dose Route Frequency Provider Last Rate Last Dose  .  acetaminophen (TYLENOL) tablet 1,000 mg  1,000 mg Oral Q12H Lahoma Crocker, MD   1,000 mg at 07/01/17 0754  . amLODipine (NORVASC) tablet 5 mg  5 mg Oral Daily Lahoma Crocker, MD   5 mg at 07/01/17 0915  . benztropine (COGENTIN) tablet 0.5 mg  0.5 mg Oral BID Lahoma Crocker, MD   0.5 mg at 07/01/17 0916  . buPROPion (WELLBUTRIN XL) 24 hr tablet 150 mg  150 mg Oral Daily Lahoma Crocker, MD   150 mg at 07/01/17 0915  . dextrose 5 % and 0.45 % NaCl with KCl 20 mEq/L infusion   Intravenous Continuous Lahoma Crocker, MD 50 mL/hr at 06/30/17 1110    . enoxaparin (LOVENOX) injection 40 mg  40 mg Subcutaneous Q24H Lahoma Crocker, MD   40 mg at 07/01/17 0755  . insulin aspart (novoLOG) injection 0-9 Units  0-9 Units Subcutaneous TID WC Lahoma Crocker, MD   1 Units at 07/01/17 0755  . lisinopril (PRINIVIL,ZESTRIL) tablet 10 mg  10 mg Oral Daily Lahoma Crocker, MD   10 mg at 07/01/17 0915  . memantine (NAMENDA) tablet 5 mg  5 mg Oral BID Lahoma Crocker, MD   5 mg at 07/01/17 0915  . metFORMIN (GLUCOPHAGE) tablet 500 mg  500 mg Oral BID AC Lahoma Crocker, MD   500 mg at 07/01/17 0754  . ondansetron (ZOFRAN) tablet 4 mg  4 mg Oral Q6H PRN Lahoma Crocker, MD       Or  . ondansetron [facility]) injection 4 mg  4 mg Intravenous Q6H PRN Lahoma Crocker, MD      . paliperidone (INVEGA) 24 hr tablet 6 mg  6 mg Oral Daily Lahoma Crocker, MD      . thiamine (VITAMIN B-1) tablet 100 mg  100 mg Oral Daily Lahoma Crocker, MD   100 mg at 07/01/17 0915  . traMADol (ULTRAM) tablet 100 mg  100 mg Oral Q12H PRN Lahoma Crocker, MD      . traZODone (DESYREL) tablet 50 mg  50 mg Oral QHS Lahoma Crocker, MD   50 mg at 06/30/17 2123    REVIEW OF SYSTEMS:  Unable to assess  PHYSICAL EXAMINATION: ECOG PERFORMANCE STATUS: 1 - Symptomatic but completely ambulatory  Vitals:   06/30/17 2300 07/01/17 0610  BP: 128/80 126/79  Pulse: 89 83  Resp: 18 18   Temp: 99 F (37.2 C) 98.6 F (37 C)  SpO2:  98%   Filed Weights   06/30/17 0625  Weight: 122 lb (55.3 kg)    GENERAL:alert, no distress and comfortable SKIN: skin color, texture, turgor are normal, no rashes or significant lesions EYES: normal, conjunctiva are pink and non-injected, sclera clear OROPHARYNX:no exudate, no erythema and lips, buccal mucosa, and tongue normal  NECK: supple, thyroid normal size, non-tender, without nodularity LYMPH:  no palpable lymphadenopathy in the cervical, axillary or inguinal LUNGS: clear to auscultation and percussion with normal breathing effort HEART: regular rate & rhythm and no murmurs and no lower extremity edema ABDOMEN:abdomen soft, non-tender and normal bowel sounds.  Noted well-healed surgical scar Musculoskeletal:no cyanosis of digits and no clubbing  PSYCH: alert but not oriented NEURO: no focal motor/sensory deficits  LABORATORY DATA:  I have reviewed the data as listed Lab Results  Component Value Date   WBC 7.9 05/19/2017   HGB 13.3 06/30/2017   HCT 39.0 06/30/2017   MCV 86.5 05/19/2017   PLT 328 05/19/2017   Recent Labs    09/19/16 1437  09/23/16 0538 04/13/17 1647 05/19/17 2236 06/30/17 0716  NA 137   < > 138 138 138 142  K 4.3   < > 4.3 4.1 3.7 3.4*  CL 105   < > 106 103 107  --   CO2 24   < > 24 27 21*  --   GLUCOSE 156*   < > 131* 171* 123* 106*  BUN 9   < > 12 12 6   --   CREATININE 0.70   < > 0.77 0.79 0.60  --   CALCIUM 9.6   < > 9.1 9.6 9.5  --   GFRNONAA >60   < > >60 >60 >60  --   GFRAA >60   < > >60 >60 >60  --   PROT 7.4  --   --  7.6 7.3  --   ALBUMIN 3.8  --   --  4.2 4.2  --   AST 28  --   --  25 16  --   ALT 35  --   --  22 15  --   ALKPHOS 87  --   --  85 80  --   BILITOT 0.5  --   --  0.3 0.3  --    < > = values in this interval not displayed.    RADIOGRAPHIC STUDIES: I have personally reviewed the radiological images Ct abdomen and pelvis dated 04/13/17 as listed and agreed with the  findings in the report.  All questions were answered. The patient knows to call the clinic with any problems, questions or concerns.  Heath Lark, MD 07/01/2017 9:51 AM

## 2017-07-01 NOTE — Telephone Encounter (Signed)
[facility] about consult appointment on 07/08/17.  They said they called the patient's daughter and notified her of the appointment.

## 2017-07-01 NOTE — Progress Notes (Signed)
Pt d/c to home. Daughter at bedside suring after visit care review. Prescriotions and reasons to return to ED/MD reviewed. PIV removed without complication. Pt escorted off of unit to car and care of daughter.

## 2017-07-01 NOTE — Progress Notes (Signed)
Pt's daughter [name] made aware of pt's discharge. [name] will be taking the patient home and is unable to provide transportation until 2pm or thereafter.

## 2017-07-01 NOTE — Discharge Summary (Signed)
Physician Discharge Summary  Patient ID: LEGACI TARMAN MRN: [id number] DOB/AGE: 04-19-1950 67 y.o.  Admit date: 06/30/2017 Discharge date: 07/01/2017  Admission Diagnoses: Ovarian mass  Discharge Diagnoses:  Principal Problem:   Ovarian mass Active Problems:   Pelvic mass in female   Discharged Condition: good  Hospital Course:  1/ patient was admitted on 06/30/17 for a diagnostic laparoscopy with peritoneal biopsies that diagnosed stage IIIC ovarian cancer (pathology pending). This was communicated with the patient and her daughter and photographic images were provided to them.  2/ surgery was uncomplicated  3/ on postoperative day 1 the patient was meeting discharge criteria: tolerating PO, voiding urine, ambulating, pain well controlled on oral medications.  4/ new medications on discharge include tylenol.  She will be set up to see a medical oncologist, Dr Alvy Bimler to discuss chemotherapy appropriateness.  Our recommended treatment course is primary debulking surgery via laparotomy followed by 6 cycles of carboplatin and paclitaxel. If she is not a candidate for chemotherapy, then there is little role for surgery, particularly as her disease is asymptomatic.   The patient's daughter desires a referral to [facility], and this will be faciliated.   Consults: None  Significant Diagnostic Studies: see surgical pathology results  Treatments: surgery: see above  Discharge Exam: Blood pressure 126/79, pulse 83, temperature 98.6 F (37 C), temperature source Oral, resp. rate 18, height 5\' 5"  (1.651 m), weight 122 lb (55.3 kg), SpO2 98 %. General appearance: alert and cooperative GI: soft, non-tender; bowel sounds normal; no masses,  no organomegaly Incision/Wound: clean, dry and in tact x 3.  Disposition: Discharge disposition: 01-Home or Self Care       Discharge Instructions    (HEART FAILURE PATIENTS) Call MD:  Anytime you have any of the following  symptoms: 1) 3 pound weight gain in 24 hours or 5 pounds in 1 week 2) shortness of breath, with or without a dry hacking cough 3) swelling in the hands, feet or stomach 4) if you have to sleep on extra pillows at night in order to breathe.   Complete by:  As directed    Call MD for:  difficulty breathing, headache or visual disturbances   Complete by:  As directed    Call MD for:  extreme fatigue   Complete by:  As directed    Call MD for:  hives   Complete by:  As directed    Call MD for:  persistant dizziness or light-headedness   Complete by:  As directed    Call MD for:  persistant nausea and vomiting   Complete by:  As directed    Call MD for:  redness, tenderness, or signs of infection (pain, swelling, redness, odor or green/yellow discharge around incision site)   Complete by:  As directed    Call MD for:  severe uncontrolled pain   Complete by:  As directed    Call MD for:  temperature >100.4   Complete by:  As directed    Diet - low sodium heart healthy   Complete by:  As directed    Diet general   Complete by:  As directed    Driving Restrictions   Complete by:  As directed    No driving for 7 days or until off narcotic pain medication   Increase activity slowly   Complete by:  As directed    Remove dressing in 24 hours   Complete by:  As directed    Sexual Activity  Restrictions   Complete by:  As directed    No intercourse for 6 weeks     Allergies as of 07/01/2017      Reactions   Hydrocodone Itching      Medication List    TAKE these medications   acetaminophen 500 MG tablet Commonly known as:  TYLENOL Take 2 tablets (1,000 mg total) by mouth every 12 (twelve) hours.   amLODipine 5 MG tablet Commonly known as:  NORVASC Take 1 tablet (5 mg total) by mouth daily.   ANTABUSE 250 MG tablet Generic drug:  disulfiram Take 250 mg by mouth daily as needed (for alcohol abuse).   benztropine 0.5 MG tablet Commonly known as:  COGENTIN Take 1 tablet (0.5 mg  total) by mouth 2 (two) times daily.   buPROPion 150 MG 24 hr tablet Commonly known as:  WELLBUTRIN XL Take 150 mg by mouth daily.   [employer] VITAMIN B-12 2000 MCG Tbcr Generic drug:  Cyanocobalamin Take 2,000 mcg by mouth daily.   INVEGA 6 MG 24 hr tablet Generic drug:  paliperidone Take 6 mg by mouth daily.   INVEGA SUSTENNA 156 MG/ML Susp injection Generic drug:  paliperidone Inject 1 mL (156 mg total) into the muscle every 30 (thirty) days.   lactulose 10 GM/15ML solution Commonly known as:  CHRONULAC Take 10 g by mouth daily.   linaclotide 290 MCG Caps capsule Commonly known as:  LINZESS Take 1 capsule (290 mcg total) by mouth daily.   lisinopril 10 MG tablet Commonly known as:  PRINIVIL,ZESTRIL Take 10 mg by mouth daily.   memantine 5 MG tablet Commonly known as:  NAMENDA Take 5 mg by mouth 2 (two) times daily.   metFORMIN 500 MG tablet Commonly known as:  GLUCOPHAGE Take 500 mg by mouth 2 (two) times daily.   temazepam 7.5 MG capsule Commonly known as:  RESTORIL Take 1 capsule (7.5 mg total) by mouth at bedtime.   thiamine 100 MG tablet Take 1 tablet (100 mg total) by mouth daily.   traZODone 50 MG tablet Commonly known as:  DESYREL Take 50 mg by mouth at bedtime.   vitamin C with rose hips 500 MG tablet Take 500 mg by mouth daily.      Follow-up Information    Everitt Amber, MD Follow up in 3 week(s).   Specialty:  Gynecologic Oncology Contact information: [address] [place] [address] [phone]           Signed: Thereasa Solo 07/01/2017, 9:18 AM

## 2017-07-01 NOTE — Discharge Instructions (Signed)
07/01/2017  Return to work: 2 weels  Activity: 1. Be up and out of the bed during the day.  Take a nap if needed.  You may walk up steps but be careful and use the hand rail.  Stair climbing will tire you more than you think, you may need to stop part way and rest.   2. No lifting or straining for 4 weeks.  3. No driving for 1 weeks.  Do Not drive if you are taking narcotic pain medicine.  4. Shower daily.  Use soap and water on your incision and pat dry; don't rub.   Medications:  - Take ibuprofen and tylenol first for pain control.   Diet: 1. Low sodium Heart Healthy Diet is recommended.  2. It is safe to use a laxative if you have difficulty moving your bowels.   Wound Care: 1. Keep clean and dry.  Shower daily.  Reasons to call the Doctor:   Fever - Oral temperature greater than 100.4 degrees Fahrenheit  Foul-smelling vaginal discharge  Difficulty urinating  Nausea and vomiting  Increased pain at the site of the incision that is unrelieved with pain medicine.  Difficulty breathing with or without chest pain  New calf pain especially if only on one side  Sudden, continuing increased vaginal bleeding with or without clots.   Follow-up: 1. See Everitt Amber in 2-3 weeks.  Contacts: For questions or concerns you should contact:  Dr. Everitt Amber at [phone] After hours and on week-ends call [phone] and ask to speak to the physician on call for Gynecologic Oncology

## 2017-07-01 NOTE — Progress Notes (Unsigned)
Patient's daughter in the lobby before her mother's pre-op testing appointment asking to speak with me.  She appeared frustrated.  She states that she was under the impression that her mother was going to be admitted for her pre-op appointment based on our previous phone call.  I advised her that per our previous discussion, her mother would be spending the night the day of surgery and I apologized if there was a misinterpretation of that information.  The daughter stated "She is not going to do it.  She will not let them stick her.  I would have thought you guys would have done your research on what you are dealing with."  Advised that we could take her to the pre-op appointment and see how the appointment went since her mother was sitting quietly in the lobby.  Then the patient's daughter demanded that her mother be given medication prior to the pre-op in order to get the labs drawn.  Per pre-surgical RN, no medications can be administered in the pre-op appointment.  Discussed with the daughter that an appt would have to be made with myself to assess her mother then administer Ativan orally prior to her pre-op.  Ultimately, after discussing with Dr. Denman George and the pre-op RN, it was decided that the patient could have her pre-op labs drawn the morning of surgery so no appointment or medication administration was necessary.  Patient and daughter were taken over to admitting at [facility] to start the pre-op appointment.

## 2017-07-29 ENCOUNTER — Inpatient Hospital Stay: Payer: Medicare Other | Attending: Gynecologic Oncology | Admitting: Gynecologic Oncology

## 2017-12-22 ENCOUNTER — Non-Acute Institutional Stay (SKILLED_NURSING_FACILITY): Payer: Medicare Other | Admitting: Internal Medicine

## 2017-12-22 ENCOUNTER — Encounter: Payer: Self-pay | Admitting: Internal Medicine

## 2017-12-22 DIAGNOSIS — F203 Undifferentiated schizophrenia: Secondary | ICD-10-CM

## 2017-12-22 DIAGNOSIS — C562 Malignant neoplasm of left ovary: Secondary | ICD-10-CM

## 2017-12-22 DIAGNOSIS — F22 Delusional disorders: Secondary | ICD-10-CM
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
## 2017-12-22 NOTE — Assessment & Plan Note (Signed)
12/22/2017 no delusions expressed, she fortunately is unaware of her metastatic ovarian cancer

## 2017-12-22 NOTE — Patient Instructions (Signed)
See assessment and plan under each diagnosis in the problem list and acutely for this visit 

## 2017-12-22 NOTE — Assessment & Plan Note (Signed)
Hospice care appropriate with inoperable metastatic ovarian cancer

## 2017-12-22 NOTE — Assessment & Plan Note (Signed)
Patient exhibits clinical dementia but no abnormal behavioral issues Psych NP be consulted should such occur

## 2018-12-23 ENCOUNTER — Emergency Department (HOSPITAL_COMMUNITY)
Admission: EM | Admit: 2018-12-23 | Discharge: 2018-12-25 | Disposition: A | Discharge status: Home / Self Care | Payer: Medicare Other | Attending: Emergency Medicine | Admitting: Emergency Medicine

## 2018-12-23 ENCOUNTER — Encounter (HOSPITAL_COMMUNITY): Payer: Self-pay

## 2018-12-23 ENCOUNTER — Other Ambulatory Visit: Payer: Self-pay

## 2018-12-23 DIAGNOSIS — Z79899 Other long term (current) drug therapy: Secondary | ICD-10-CM | POA: Insufficient documentation

## 2018-12-23 DIAGNOSIS — E119 Type 2 diabetes mellitus without complications: Secondary | ICD-10-CM | POA: Insufficient documentation

## 2018-12-23 DIAGNOSIS — F203 Undifferentiated schizophrenia: Secondary | ICD-10-CM | POA: Diagnosis present

## 2018-12-23 DIAGNOSIS — F17210 Nicotine dependence, cigarettes, uncomplicated: Secondary | ICD-10-CM | POA: Insufficient documentation

## 2018-12-23 DIAGNOSIS — Z7984 Long term (current) use of oral hypoglycemic drugs: Secondary | ICD-10-CM | POA: Diagnosis not present

## 2018-12-23 DIAGNOSIS — R443 Hallucinations, unspecified: Secondary | ICD-10-CM

## 2018-12-23 DIAGNOSIS — F200 Paranoid schizophrenia: Secondary | ICD-10-CM | POA: Diagnosis not present

## 2018-12-23 DIAGNOSIS — R4182 Altered mental status, unspecified: Secondary | ICD-10-CM | POA: Diagnosis present

## 2018-12-23 DIAGNOSIS — F22 Delusional disorders: Secondary | ICD-10-CM

## 2018-12-23 DIAGNOSIS — I10 Essential (primary) hypertension: Secondary | ICD-10-CM | POA: Diagnosis not present

## 2018-12-23 LAB — BASIC METABOLIC PANEL
Anion gap: 11 (ref 5–15)
BUN: 15 mg/dL (ref 8–23)
CO2: 23 mmol/L (ref 22–32)
Calcium: 9.4 mg/dL (ref 8.9–10.3)
Chloride: 106 mmol/L (ref 98–111)
Creatinine, Ser: 0.72 mg/dL (ref 0.44–1.00)
GFR calc Af Amer: >60 mL/min (ref >60)
GFR calc non Af Amer: >60 mL/min (ref >60)
Glucose, Bld: 178 mg/dL — ABNORMAL HIGH (ref 70–99)
Potassium: 3.6 mmol/L (ref 3.5–5.1)
Sodium: 140 mmol/L (ref 135–145)

## 2018-12-23 LAB — CBC WITH DIFFERENTIAL/PLATELET
Abs Immature Granulocytes: 0.01 10*3/uL (ref 0.00–0.07)
Basophils Absolute: 0 10*3/uL (ref 0.0–0.1)
Basophils Relative: 1 %
Eosinophils Absolute: 0.1 10*3/uL (ref 0.0–0.5)
Eosinophils Relative: 1 %
HCT: 41.7 % (ref 36.0–46.0)
Hemoglobin: 13.1 g/dL (ref 12.0–15.0)
Immature Granulocytes: 0 %
Lymphocytes Relative: 36 %
Lymphs Abs: 3.1 10*3/uL (ref 0.7–4.0)
MCH: 28.2 pg (ref 26.0–34.0)
MCHC: 31.4 g/dL (ref 30.0–36.0)
MCV: 89.9 fL (ref 80.0–100.0)
Monocytes Absolute: 0.6 10*3/uL (ref 0.1–1.0)
Monocytes Relative: 7 %
Neutro Abs: 4.8 10*3/uL (ref 1.7–7.7)
Neutrophils Relative %: 55 %
Platelets: 382 10*3/uL (ref 150–400)
RBC: 4.64 MIL/uL (ref 3.87–5.11)
RDW: 15.6 % — ABNORMAL HIGH (ref 11.5–15.5)
WBC: 8.6 10*3/uL (ref 4.0–10.5)
nRBC: 0 % (ref 0.0–0.2)

## 2018-12-23 LAB — ACETAMINOPHEN LEVEL: Acetaminophen (Tylenol), Serum: <10 ug/mL — ABNORMAL LOW (ref 10–30)

## 2018-12-23 LAB — SALICYLATE LEVEL: Salicylate Lvl: <7 mg/dL (ref 2.8–30.0)

## 2018-12-23 MED ORDER — TEMAZEPAM 15 MG PO CAPS
15.0000 mg | ORAL_CAPSULE | Freq: Every day | ORAL | Status: DC
  Filled 2018-12-23: qty 1

## 2018-12-23 MED ORDER — TEMAZEPAM 15 MG PO CAPS
15.0000 mg | ORAL_CAPSULE | Freq: Every day | ORAL | Status: DC
  Administered 2018-12-24: 15 mg via ORAL
  Filled 2018-12-23: qty 1

## 2018-12-23 MED ORDER — PALIPERIDONE ER 6 MG PO TB24
6.0000 mg | ORAL_TABLET | Freq: Two times a day (BID) | ORAL | Status: DC
  Administered 2018-12-24 – 2018-12-25 (×3): 6 mg via ORAL
  Filled 2018-12-23 (×5): qty 1

## 2018-12-23 MED ORDER — MEMANTINE HCL 5 MG PO TABS
5.0000 mg | ORAL_TABLET | Freq: Two times a day (BID) | ORAL | Status: DC
  Administered 2018-12-24 – 2018-12-25 (×3): 5 mg via ORAL
  Filled 2018-12-23 (×5): qty 1

## 2018-12-23 MED ORDER — AMLODIPINE BESYLATE 5 MG PO TABS
5.0000 mg | ORAL_TABLET | Freq: Every day | ORAL | Status: DC
  Administered 2018-12-24 – 2018-12-25 (×2): 5 mg via ORAL
  Filled 2018-12-23 (×2): qty 1

## 2018-12-23 MED ORDER — TRAZODONE HCL 100 MG PO TABS
100.0000 mg | ORAL_TABLET | Freq: Every day | ORAL | Status: DC
  Administered 2018-12-24 (×2): 100 mg via ORAL
  Filled 2018-12-23 (×2): qty 1

## 2018-12-23 MED ORDER — VITAMIN B-1 100 MG PO TABS
100.0000 mg | ORAL_TABLET | Freq: Every day | ORAL | Status: DC
  Administered 2018-12-24 – 2018-12-25 (×2): 100 mg via ORAL
  Filled 2018-12-23 (×2): qty 1

## 2018-12-23 MED ORDER — LETROZOLE 2.5 MG PO TABS
2.5000 mg | ORAL_TABLET | Freq: Every day | ORAL | Status: DC
  Administered 2018-12-24 – 2018-12-25 (×2): 2.5 mg via ORAL
  Filled 2018-12-23 (×2): qty 1

## 2018-12-23 MED ORDER — METFORMIN HCL 500 MG PO TABS
500.0000 mg | ORAL_TABLET | Freq: Every day | ORAL | Status: DC
  Administered 2018-12-24 – 2018-12-25 (×2): 500 mg via ORAL
  Filled 2018-12-23 (×2): qty 1

## 2018-12-23 MED ORDER — LUBIPROSTONE 24 MCG PO CAPS
24.0000 ug | ORAL_CAPSULE | Freq: Every day | ORAL | Status: DC
  Administered 2018-12-24 – 2018-12-25 (×2): 24 ug via ORAL
  Filled 2018-12-23 (×2): qty 1

## 2018-12-23 MED ORDER — LISINOPRIL 10 MG PO TABS
10.0000 mg | ORAL_TABLET | Freq: Every day | ORAL | Status: DC
  Administered 2018-12-24 – 2018-12-25 (×2): 10 mg via ORAL
  Filled 2018-12-23 (×2): qty 1

## 2018-12-23 MED ORDER — RISPERIDONE 0.5 MG PO TABS
0.2500 mg | ORAL_TABLET | Freq: Two times a day (BID) | ORAL | Status: DC
  Administered 2018-12-24: 0.25 mg via ORAL
  Filled 2018-12-23 (×2): qty 1
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
## 2018-12-23 NOTE — ED Notes (Signed)
Pt is in TCU

## 2018-12-23 NOTE — [facility] Counselor (Signed)
Clinician to call back in 20 minutes. IVC paperwork to be faxed.    Vertell Novak, [place], [facility], [facility] Triage Specialist [phone]
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
## 2018-12-24 DIAGNOSIS — F200 Paranoid schizophrenia: Secondary | ICD-10-CM | POA: Diagnosis not present

## 2018-12-24 LAB — RAPID URINE DRUG SCREEN, HOSP PERFORMED
Amphetamines: NOT DETECTED
Barbiturates: NOT DETECTED
Benzodiazepines: POSITIVE — AB
Cocaine: NOT DETECTED
Opiates: NOT DETECTED
Tetrahydrocannabinol: NOT DETECTED

## 2018-12-24 LAB — URINALYSIS, ROUTINE W REFLEX MICROSCOPIC
Bilirubin Urine: NEGATIVE
Glucose, UA: NEGATIVE mg/dL
Hgb urine dipstick: NEGATIVE
Ketones, ur: NEGATIVE mg/dL
Nitrite: NEGATIVE
Protein, ur: NEGATIVE mg/dL
Specific Gravity, Urine: 1.017 (ref 1.005–1.030)
pH: 6 (ref 5.0–8.0)

## 2018-12-24 MED ORDER — PANTOPRAZOLE SODIUM 40 MG PO TBEC
40.0000 mg | DELAYED_RELEASE_TABLET | Freq: Every day | ORAL | Status: DC
  Administered 2018-12-25: 40 mg via ORAL
  Filled 2018-12-24: qty 1

## 2018-12-24 MED ORDER — TRAZODONE HCL 100 MG PO TABS: 100.0000 mg | ORAL_TABLET | Freq: Every day | ORAL | Status: DC

## 2018-12-24 MED ORDER — ACETAMINOPHEN 500 MG PO TABS
500.0000 mg | ORAL_TABLET | Freq: Three times a day (TID) | ORAL | Status: DC | PRN
  Administered 2018-12-24: 500 mg via ORAL
  Filled 2018-12-24: qty 1

## 2018-12-24 MED ORDER — BUPROPION HCL ER (XL) 150 MG PO TB24
150.0000 mg | ORAL_TABLET | Freq: Every day | ORAL | Status: DC
  Administered 2018-12-25: 150 mg via ORAL
  Filled 2018-12-24: qty 1

## 2018-12-24 MED ORDER — DIAZEPAM 5 MG PO TABS: 5.0000 mg | ORAL_TABLET | Freq: Three times a day (TID) | ORAL | Status: DC | PRN

## 2018-12-24 NOTE — Progress Notes (Addendum)
EDP stated pt is A&O and does not appear to be in distress and thus is not appropriate for the medication fentanyl, which the pt has been prescribed, per the pt's medical records prior to coming to the hospital.  Pt's daughter updated and states she is out of town and working until morning and can pick up the pt at 9:30 am and stated again that she will be here in the morning immediately following work.  Pt's daughter asked that the CSW speak to pt's sister and CSW stated he would call the pt's sister shortly.  Pt's daughter stated daughter would provide the pt's sister with the CSW's number and cSW stated he could answer her number at any time.  Pt's daughter again stated she would be at the ED on the morning of 12/5 to p/u the pt.  9:50 PM RN/EDP updated.  2nd shift ED CSW will leave handoff for 1st shift ED CSW.   CSW will continue to follow for D/C needs.  Alphonse Guild. Olanda Downie, LCSW, LCAS, CSI Transitions of Care Clinical Social Worker Care Coordination Department Ph: [phone]
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
## 2018-12-24 NOTE — Progress Notes (Addendum)
Consult request has been received. CSW attempting to follow up at present time.  CSW received a call from [place] who stated pt's daughter wants to talk to the "higher-up" and wants to, "sue for incompetence" and is angry about pt being discharged.  Per Gean Quint O'Conor at ph: [phone] wants to discuss this with the CSW and is very, very angry.  4:32 PM CSW spoke to [facility] and immediately began to seek to explain that the pt's daughter that the CSW spoke to [facility] the [employer] who explained the daughter was upset and pt's daughter began speaking about past visits and her anger concerning them.   CSW actively listened and provided clarification and mirroring and assisted the daughter in clarifying her questions.  Pt's daughter wanted to know what meds the pt is taking, what meds she was given if any, in addition to what she is prescribed and if the pt was medically cleared/checked and checked for a UTI.   CSW spoke to pt's RN who stated the pt took all of her medications today, has calm and cooperative and hasn't done any of the things described, has been pleasant, know where she is and will have a conversation about her nails and how pretty they looks.  In addition, she does not present as HI/SI. In addition, pt has been taking all of her prescribed meds and has been since (metformin) 0824, htn meds, 9:45am, and took (11:57) invega, a dementia drug and a cancer drug and a vitiamin B and another possible cancer med. Pt was checked for a UTI, a specimen was collected, and looks good.  Pt is now sleeping in bed and resting by sitting in her chair and per notes was sitting up a lot a night awake in her chair.  Per the EDP, labs indicate that the pt's electrolytes looks normal, white count looks good, the urine sample was clear and not demonstrating any evidence of a UTI.  Pt's daughter updated and then asked what the pt has been eating.  Per the tech the  sitter stated the pt ate a little of dinner and ate some of the rest of her meals, but not a whole lot.  Pt's daughter was updated and asked about the pt's fentanyl patch which the pt normally has  CSW received a call from pt's RN who stated pt's med list prior to admission did not have fentanyl on it and when asked the pt stated she was not in any pain and had her feet propped up on the bed while saying it.  Pt also asked what fentanyl was as she was not familiar with the name.  Per the RN, pt is taking tylenol for fours, and then also vicodin every eight hours (for moderate pain).  Pe RN, pt has written on 12/4 med list pt takes a 50 mcg every 48 hours, and that on 11/30 the pt got her last patch.                 EDP updated and will assess the pt now.  Daughter updated and said she will only be agreeable to p/u the pt on 12/24/18 at 8:30pm as she is out of town due to being unaware that pt, "[facility]'t [facility]".  CSW will continue to follow for D/C needs.  Alphonse Guild. Terren Haberle, LCSW, LCAS, CSI Transitions of Care Clinical Social Worker Care Coordination Department Ph: [phone]

## 2018-12-24 NOTE — Discharge Instructions (Signed)
For your behavioral health needs, you are advised to continue treatment with Dr Nyoka Cowden, your regular outpatient provider.

## 2018-12-24 NOTE — Progress Notes (Signed)
CSW received consult to assist with getting in contact with patient's daughter to let her know of psych disposition and discuss d/c plans from the hospital.   CSW spoke with patient's daughter, [name], who reports she wanted to know more of what was done for patient and why patient was not being admitted. [name] reports patient has not been taking her medications and thus her behaviors have increased. [name] reports she wanted her mom to be admitted [facility] inpatient. CSW explained that patient has been psychiatrically cleared. [name] stated she did not understand why. CSW spoke with Gershon Mussel with TTS as well as Jobie Quaker NP who also spoke with [name] to discuss medication options.   CSW then received a call from [place] with Hospice who reports she also spoke with [name] and [name] verbalized to her that she was not taking patient back into her home. Anderson Malta reports that patient's home care SW has been trying to get patient into [facility] for LTC for the past 3 weeks. CSW then spoke with home care SW [place] who reports she will follow up with [facility] to find out when patient can be accepted and also speak with the daughter and let her know the only option at this time is for patient to go back home until patient can get into [facility]. Melanie to follow up with CSW.   [place]en Circle, LCSW Transitions of Care Department [facility] ED [phone]

## 2018-12-24 NOTE — ED Notes (Signed)
Pt denies pain. She does not know what Fentanyl patch is and says that she does not have one on.  Currently she is not in any pain.

## 2018-12-24 NOTE — BH Assessment (Signed)
[facility] Assessment Progress Note  Per Hampton Abbot, MD, this pt does not require psychiatric hospitalization at this time.  Pt is psychiatrically cleared.  Pt presents under IVC initiated by pt's daughter and upheld by EDP Daleen Bo, MD, which Dr Dwyane Dee has rescinded.  Pt's discharge instructions advise pt to continue outpatient treatment with Dr Nyoka Cowden, pt's psychiatrist through the [facility] system.  Letitia Libra, FNP reports that she will be ordering a social work consult for the pt.  Pt's nurse, Diane, has been notified.  At 11:15 this Probation officer called pt's daughter, Trey Paula [phone]), to notify her of pt's psychiatric disposition.  She reports that she is pt's health care power of attorney.  I informed her that HCPOA document is not currently found on pt's chart.  She agrees to have pt's hospice social worker contact us to make a copy available.   I have also spoken to [place] to notify her.  Jalene Mullet, [place] Triage Specialist [phone]
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
## 2018-12-24 NOTE — Consult Note (Signed)
[facility] Psych ED Discharge  12/24/2018 11:16 AM Kristen Colon  MRN:  [id number] Principal Problem: <principal problem not specified> Discharge Diagnoses: Active Problems:   Schizophrenia, undifferentiated ([place])   Subjective: Patient assessed by nurse practitioner along with Dr. Dwyane Dee.  Patient pleasant and cooperative during assessment.  Patient alert and oriented to self.  Patient denies any suicidal or homicidal ideations.  Patient denies history of self-harm, denies history of suicidal behaviors.  Patient denies hallucinations.  When questioned regarding attempting to cut toes patient states "I had a callus on my foot and my toenails were as long as my little finger I used a razor to cut them because I did not have a fingernail clipper."  Patient endorses smoking cigarettes.  Patient denies alcohol or other substance use. Patient reports psychiatrist is Dr. Nyoka Cowden in [facility]. Patient reports living home with daughter, Larkin Ina.  Patient reports "I live with my stepdaughter, she takes my check, and she does not let me walk out the door there is an alarm system on the door." Attempted to reach daughter Larkin Ina by phone, [phone] no opportunity to leave a message.  Total Time spent with patient: 30 minutes  Past Psychiatric History: Schizophrenia  Past Medical History:  Past Medical History:  Diagnosis Date  . Anxiety   . Dementia ([place])   . Dyspnea   . Headache   . Hypertension   . Schizophrenia ([place])    paranoid    Past Surgical History:  Procedure Laterality Date  . DIAGNOSTIC LAPAROSCOPY     pelvic mass Dr. Denman George 06-30-17  . LAPAROSCOPY N/A 06/30/2017   Procedure: LAPAROSCOPY DIAGNOSTIC WITH BIOPSIES;  Surgeon: Everitt Amber, MD;  Location: [facility];  Service: Gynecology;  Laterality: N/A;   Family History:  Family History  Problem Relation Age of Onset  . Heart disease Mother   . Diabetes Sister    Family Psychiatric  History: Unknown Social History:  Social  History   Substance and Sexual Activity  Alcohol Use Not Currently     Social History   Substance and Sexual Activity  Drug Use Not Currently  . Types: Marijuana    Social History   Socioeconomic History  . Marital status: Divorced    Spouse name: Not on file  . Number of children: Not on file  . Years of education: Not on file  . Highest education level: Not on file  Occupational History  . Not on file  Social Needs  . Financial resource strain: Not on file  . Food insecurity    Worry: Not on file    Inability: Not on file  . Transportation needs    Medical: Not on file    Non-medical: Not on file  Tobacco Use  . Smoking status: Current Every Day Smoker    Packs/day: 1.00    Years: 5.00    Pack years: 5.00    Types: Cigarettes  . Smokeless tobacco: Never Used  Substance and Sexual Activity  . Alcohol use: Not Currently  . Drug use: Not Currently    Types: Marijuana  . Sexual activity: Not Currently    Comment: quit 5 years ago  Lifestyle  . Physical activity    Days per week: Not on file    Minutes per session: Not on file  . Stress: Not on file  Relationships  . Social Herbalist on phone: Not on file    Gets together: Not on file    Attends  religious service: Not on file    Active member of club or organization: Not on file    Attends meetings of clubs or organizations: Not on file    Relationship status: Not on file  Other Topics Concern  . Not on file  Social History Narrative  . Not on file    Has this patient used any form of tobacco in the last 30 days? (Cigarettes, Smokeless Tobacco, Cigars, and/or Pipes) A prescription for an FDA-approved tobacco cessation medication was offered at discharge and the patient refused  Current Medications: Current Facility-Administered Medications  Medication Dose Route Frequency Provider Last Rate Last Dose  . amLODipine (NORVASC) tablet 5 mg  5 mg Oral Daily Daleen Bo, MD   5 mg at 12/24/18  0945  . letrozole [facility]) tablet 2.5 mg  2.5 mg Oral Daily Daleen Bo, MD      . lisinopril (ZESTRIL) tablet 10 mg  10 mg Oral Daily Daleen Bo, MD   10 mg at 12/24/18 0945  . lubiprostone (AMITIZA) capsule 24 mcg  24 mcg Oral Daily Daleen Bo, MD      . memantine [facility]) tablet 5 mg  5 mg Oral BID Daleen Bo, MD      . metFORMIN (GLUCOPHAGE) tablet 500 mg  500 mg Oral Q breakfast Daleen Bo, MD   500 mg at 12/24/18 0824  . paliperidone (INVEGA) 24 hr tablet 6 mg  6 mg Oral BID Daleen Bo, MD      . thiamine (VITAMIN B-1) tablet 100 mg  100 mg Oral Daily Daleen Bo, MD      . traZODone (DESYREL) tablet 100 mg  100 mg Oral QHS Daleen Bo, MD   100 mg at 12/24/18 0034   Current Outpatient Medications  Medication Sig Dispense Refill  . acetaminophen (TYLENOL) 500 MG tablet Take 500 mg by mouth every 8 (eight) hours as needed for mild pain.    Marland Kitchen amLODipine (NORVASC) 5 MG tablet Take 1 tablet (5 mg total) by mouth daily. 30 tablet 3  . Ascorbic Acid (VITAMIN C) 1000 MG tablet Take 1,000 mg by mouth daily.    Marland Kitchen buPROPion (WELLBUTRIN XL) 150 MG 24 hr tablet Take 150 mg by mouth daily.    . cyclobenzaprine (FLEXERIL) 5 MG tablet Take 5 mg by mouth daily.    . diazepam (VALIUM) 5 MG tablet Take 5 mg by mouth every 8 (eight) hours as needed for anxiety.    . fentaNYL (DURAGESIC) 50 MCG/HR Place 1 patch onto the skin as directed. Every 48 hours    . HYDROcodone-acetaminophen (NORCO/VICODIN) 5-325 MG tablet Take 1 tablet by mouth every 4 (four) hours as needed for moderate pain.    Marland Kitchen INVEGA SUSTENNA 156 MG/ML SUSP injection Inject 1 mL (156 mg total) into the muscle every 30 (thirty) days. 0.9 mL 0  . lactulose (CHRONULAC) 10 GM/15ML solution Take 10 g by mouth daily.   3  . letrozole (FEMARA) 2.5 MG tablet Take 2.5 mg by mouth daily.    Marland Kitchen lisinopril (PRINIVIL,ZESTRIL) 10 MG tablet Take 10 mg by mouth daily.     Marland Kitchen lubiprostone (AMITIZA) 24 MCG capsule Take 24 mcg by  mouth daily.    . memantine (NAMENDA) 5 MG tablet Take 5 mg by mouth 2 (two) times daily.    . metFORMIN (GLUCOPHAGE) 500 MG tablet Take 500 mg by mouth daily with breakfast.     . Multiple Vitamin (MULTIVITAMIN WITH MINERALS) TABS tablet Take 1 tablet by mouth  daily.    . ondansetron (ZOFRAN-ODT) 4 MG disintegrating tablet Take 4 mg by mouth every 8 (eight) hours as needed for nausea/vomiting.    . risperiDONE (RISPERDAL) 0.25 MG tablet Take 0.25 mg by mouth 2 (two) times daily as needed (agitation).     . STOOL SOFTENER 100 MG capsule Take 100 mg by mouth 2 (two) times daily.    . temazepam (RESTORIL) 15 MG capsule Take 15 mg by mouth at bedtime.    . traZODone (DESYREL) 50 MG tablet Take 100 mg by mouth at bedtime.    . INVEGA 6 MG 24 hr tablet Take 6 mg by mouth 2 (two) times daily.   1  . linaclotide (LINZESS) 290 MCG CAPS capsule Take 1 capsule (290 mcg total) by mouth daily. (Patient not taking: Reported on 12/24/2018) 30 capsule 0  . pantoprazole (PROTONIX) 40 MG tablet Take 40 mg by mouth daily.    Marland Kitchen thiamine 100 MG tablet Take 1 tablet (100 mg total) by mouth daily. (Patient not taking: Reported on 12/24/2018) 30 tablet 3   PTA Medications: (Not in a hospital admission)   Musculoskeletal: Strength & Muscle Tone: within normal limits Gait & Station: Unable to assess Patient leans: Unable to assess  Psychiatric Specialty Exam: Physical Exam  Nursing note and vitals reviewed. Constitutional: She appears well-developed.  HENT:  Head: Normocephalic.  Cardiovascular: Normal rate.  Respiratory: Effort normal.  Neurological: She is alert.    Review of Systems  Constitutional: Negative.   HENT: Negative.   Eyes: Negative.   Respiratory: Negative.   Cardiovascular: Negative.   Gastrointestinal: Negative.   Genitourinary: Negative.   Musculoskeletal: Negative.   Skin: Negative.   Neurological: Negative.   Endo/Heme/Allergies: Negative.     Blood pressure (!) 146/82, pulse  77, temperature 97.7 F (36.5 C), temperature source Oral, resp. rate 18, height 5\' 2"  (1.575 m), weight 49.4 kg, SpO2 100 %.Body mass index is 19.92 kg/m.  General Appearance: Casual  Eye Contact:  Fair  Speech:  Clear and Coherent and Normal Rate  Volume:  Normal  Mood:  Euthymic  Affect:  Congruent  Thought Process:  Coherent, Goal Directed and Descriptions of Associations: Intact  Orientation:  Other:  Person  Thought Content:  Logical  Suicidal Thoughts:  No  Homicidal Thoughts:  No  Memory:  Immediate;   Poor Recent;   Poor Remote;   Poor  Judgement:  Fair  Insight:  Lacking  Psychomotor Activity:  Normal  Concentration:  Concentration: Fair and Attention Span: Fair  Recall:  Poor  Fund of Knowledge:  Fair  Language:  Fair  Akathisia:  No  Handed:  Right  AIMS (if indicated):     Assets:  Communication Skills Desire for Improvement Financial Resources/Insurance Housing Social Support  ADL's:  Intact  Cognition:  Impaired,  Mild  Sleep:        Demographic Factors:  NA  Loss Factors: Decline in physical health  Historical Factors: NA  Risk Reduction Factors:   Sense of responsibility to family, Living with another person, especially a relative, Positive social support, Positive therapeutic relationship and Positive coping skills or problem solving skills  Continued Clinical Symptoms:  schizophrenia  Cognitive Features That Contribute To Risk:  Loss of executive function    Suicide Risk:  Minimal: No identifiable suicidal ideation.  Patients presenting with no risk factors but with morbid ruminations; may be classified as minimal risk based on the severity of the depressive symptoms    Plan  Of Care/Follow-up recommendations:  Discontinue Restoril, discontinue Risperdal. According to medical record patient was not taking Restoril or Risperdal prior to admission. Other:  Follow-up with established  outpatient provider  Disposition: Discharged  home Emmaline Kluver, [place] 12/24/2018, 11:16 AM

## 2018-12-24 NOTE — ED Notes (Addendum)
Pt has been calm and appropriate today. Oriented x3.  Does ADLS independently. Cooperative with care.  Denies SI/HI/AVH and does not appear to be responding to internal stimuli.

## 2018-12-24 NOTE — ED Notes (Signed)
Offered to assist patient to bed as she is sitting in bedside chair. Patient declines offer and states that she is more comfortable in the chair. Patient denies any needs at this time.
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
## 2018-12-25 DIAGNOSIS — F200 Paranoid schizophrenia: Secondary | ICD-10-CM | POA: Diagnosis not present

## 2018-12-25 NOTE — Progress Notes (Signed)
12/25/2018  [name]'s Hospice nurse has called to speak with patient. Kristen Colon states that she needs to ask patient questions that she has to agree to before the daughter would agree to take her back.

## 2018-12-25 NOTE — Progress Notes (Signed)
12/25/2018  Received call from Charge RN stating that [facility] could not transport patient home d/t staffing. Per Charge RN call [facility] for patient transportation home. 12/25/2018  1358 Called [phone] to arrange transport for patient. Waiting for EMS.

## 2018-12-25 NOTE — Progress Notes (Addendum)
Per chart review, [facility] CSW has been in communication with the patient's daughter, Kristen Colon, and [place] workers throughout the day on 12/04. Patient's daughter, Kristen Colon, was agreeable to pick up the patient this morning (12/05) from [facility] at 9:30am.  CSW received a call from Lattie Haw, Social Worker with [facility] [phone]) regarding this patient and their family. Lattie Haw states that the patient's daughter has reservations picking up the patient for discharge and bringing her home.   Lattie Haw was asked by the daughter to have a conversation with this patient prior to discharge regarding boundaries and expectations; most importantly, medication compliance. CSW provided Lattie Haw with the phone number for the TCU nurses desk to facilitate this conversation.  Per Lattie Haw, daughter is in [place] with a flat tire and does not expect to be home until approximately 12pm. The daughter has also requested sheriff's transportation for discharge, as this service has been used in the past. CSW reviewed chart, patient was IVC'd, meaning sheriff transportation is an option for this patient.  Lattie Haw asked CSW wait to schedule sheriff's transportation until she is able to communicate with patient and family. CSW waiting for returned call.  CSW following for discharge needs.    Update 10:10am  CSW received a return call from [place], Designer, fashion/clothing. Lattie Haw shared that she had a detailed conversation with this patient regarding expectations for returning home. The patient was reportedly agreeable to maintain medication compliance and abide by other house rules while living in her daughter's home. Lattie Haw shared she also spoke with the patient's daughter and relayed their conversation. Per Lattie Haw, daughter Kristen Colon is agreeable to the patient discharging home with sheriff's transportation after 12pm today, as the daughter is still in [facility] with a flat tire at this time.  CSW thanked [place] for the follow up. CSW to  update nursing staff.  CSW signing off, as no other social work needs are evident at this time. Please reconsult if additional social work needs arise.  Stephanie Acre, [place] Social Worker [phone]

## 2018-12-25 NOTE — ED Notes (Signed)
Patient resting peacefully at this time with no complaints. Denies any needs currently.

## 2018-12-25 NOTE — Progress Notes (Signed)
12/25/2018  1438  Called Mrs Hart Carwin to notify her that patient is on her way home via [place]. Per Daughter she is okay.

## 2018-12-25 NOTE — Progress Notes (Signed)
[facility] will transport patient to her home address at [place], [place] Social Worker [phone]

## 2018-12-25 NOTE — Progress Notes (Signed)
12/25/2018  0940  Called patient's daughter Mrs Hart Carwin [phone] to see what time she would be here to pick up patient. Mrs Hart Carwin states she is at work in [place] and has a Hydrographic surveyor. She is waiting for someone to come out to fix the tire and we would need to have the [place] transport patient home. Offered her the desk number to call us when she got home, but she could not take down the number.

## 2018-12-25 NOTE — Progress Notes (Signed)
Manufacturing engineer [facility]) RN note @1000   Liaison received a call to contact the daughter [name] concerning pt's pending discharge today. Daughter states she will not be able to pick up the pt due to a "flat tire" and she remains in [place] were she works. Daughter concerned that the pt is "masking her symptoms" and will return to her old behaviors and non-compliance with taking her medications. Daughter is refusing to speak with the pt at this time and requested this liaison to contact the pt directly with some request prior to being discharged and accepted in the home. Daughter is requested liaison verify pt will behave appropriately like she is doing currently at the hospital, taking all her medications and exhibiting good behavior.   Pt spoke with pt and relayed the above information and verified pt will be on good behavior and take all her prescribed medications with no problems. Pt indicated she will "go alone with the program" and keep the peace.  Pt willing to transport today home.  RN followed up with the daughter [name] and verified pt can be discharged today to the address on record however with the request for pt to be transported by the [place]. RN contacted CSW Baldo Ash) and made the request for transportation as noted above. [name] requested to make transportation arrangement after 12 noon today. No other requested or inquires at this time.  Plans for pt to return home with Hospice services upon discharge. Will alert the team for ongoing follow up.   Thank You  Raina Mina, RN,BSN [facility]) [facility] [phone]  *Daughter requested [facility] CSW Threasa Beards) continues to work with [facility] for LTC placement.  [place] are on [place].

## 2019-01-27 ENCOUNTER — Inpatient Hospital Stay (HOSPITAL_COMMUNITY): Payer: Medicare Other

## 2019-01-27 ENCOUNTER — Encounter (HOSPITAL_COMMUNITY): Payer: Self-pay | Admitting: Emergency Medicine

## 2019-01-27 ENCOUNTER — Inpatient Hospital Stay (HOSPITAL_COMMUNITY)
Admission: EM | Admit: 2019-01-27 | Discharge: 2019-02-10 | DRG: 177 | Disposition: A | Discharge status: Skilled Nursing Facility | Payer: Medicare Other | Attending: Internal Medicine | Admitting: Internal Medicine

## 2019-01-27 ENCOUNTER — Emergency Department (HOSPITAL_COMMUNITY): Payer: Medicare Other

## 2019-01-27 ENCOUNTER — Other Ambulatory Visit: Payer: Self-pay

## 2019-01-27 DIAGNOSIS — N390 Urinary tract infection, site not specified: Secondary | ICD-10-CM | POA: Diagnosis present

## 2019-01-27 DIAGNOSIS — C562 Malignant neoplasm of left ovary: Secondary | ICD-10-CM | POA: Diagnosis present

## 2019-01-27 DIAGNOSIS — J9621 Acute and chronic respiratory failure with hypoxia: Secondary | ICD-10-CM | POA: Diagnosis present

## 2019-01-27 DIAGNOSIS — J9600 Acute respiratory failure, unspecified whether with hypoxia or hypercapnia: Secondary | ICD-10-CM

## 2019-01-27 DIAGNOSIS — F17210 Nicotine dependence, cigarettes, uncomplicated: Secondary | ICD-10-CM | POA: Diagnosis present

## 2019-01-27 DIAGNOSIS — E871 Hypo-osmolality and hyponatremia: Secondary | ICD-10-CM | POA: Diagnosis present

## 2019-01-27 DIAGNOSIS — Z79811 Long term (current) use of aromatase inhibitors: Secondary | ICD-10-CM | POA: Diagnosis not present

## 2019-01-27 DIAGNOSIS — Z66 Do not resuscitate: Secondary | ICD-10-CM | POA: Diagnosis not present

## 2019-01-27 DIAGNOSIS — E119 Type 2 diabetes mellitus without complications: Secondary | ICD-10-CM

## 2019-01-27 DIAGNOSIS — F200 Paranoid schizophrenia: Secondary | ICD-10-CM | POA: Diagnosis present

## 2019-01-27 DIAGNOSIS — Z681 Body mass index (BMI) 19 or less, adult: Secondary | ICD-10-CM

## 2019-01-27 DIAGNOSIS — J9601 Acute respiratory failure with hypoxia: Secondary | ICD-10-CM | POA: Diagnosis present

## 2019-01-27 DIAGNOSIS — F203 Undifferentiated schizophrenia: Secondary | ICD-10-CM | POA: Diagnosis present

## 2019-01-27 DIAGNOSIS — C569 Malignant neoplasm of unspecified ovary: Secondary | ICD-10-CM | POA: Diagnosis present

## 2019-01-27 DIAGNOSIS — E861 Hypovolemia: Secondary | ICD-10-CM | POA: Diagnosis present

## 2019-01-27 DIAGNOSIS — Z8249 Family history of ischemic heart disease and other diseases of the circulatory system: Secondary | ICD-10-CM

## 2019-01-27 DIAGNOSIS — E1165 Type 2 diabetes mellitus with hyperglycemia: Secondary | ICD-10-CM | POA: Diagnosis present

## 2019-01-27 DIAGNOSIS — Z853 Personal history of malignant neoplasm of breast: Secondary | ICD-10-CM

## 2019-01-27 DIAGNOSIS — Z833 Family history of diabetes mellitus: Secondary | ICD-10-CM

## 2019-01-27 DIAGNOSIS — K5900 Constipation, unspecified: Secondary | ICD-10-CM | POA: Diagnosis present

## 2019-01-27 DIAGNOSIS — Z8543 Personal history of malignant neoplasm of ovary: Secondary | ICD-10-CM

## 2019-01-27 DIAGNOSIS — J1282 Pneumonia due to coronavirus disease 2019: Secondary | ICD-10-CM | POA: Diagnosis present

## 2019-01-27 DIAGNOSIS — G9341 Metabolic encephalopathy: Secondary | ICD-10-CM | POA: Diagnosis not present

## 2019-01-27 DIAGNOSIS — I10 Essential (primary) hypertension: Secondary | ICD-10-CM | POA: Diagnosis present

## 2019-01-27 DIAGNOSIS — A4189 Other specified sepsis: Secondary | ICD-10-CM | POA: Diagnosis present

## 2019-01-27 DIAGNOSIS — Z880 Allergy status to penicillin: Secondary | ICD-10-CM

## 2019-01-27 DIAGNOSIS — F0390 Unspecified dementia without behavioral disturbance: Secondary | ICD-10-CM | POA: Diagnosis present

## 2019-01-27 DIAGNOSIS — R05 Cough: Secondary | ICD-10-CM | POA: Diagnosis present

## 2019-01-27 DIAGNOSIS — Z7984 Long term (current) use of oral hypoglycemic drugs: Secondary | ICD-10-CM | POA: Diagnosis not present

## 2019-01-27 DIAGNOSIS — Z885 Allergy status to narcotic agent status: Secondary | ICD-10-CM

## 2019-01-27 DIAGNOSIS — U071 COVID-19: Principal | ICD-10-CM | POA: Diagnosis present

## 2019-01-27 DIAGNOSIS — E440 Moderate protein-calorie malnutrition: Secondary | ICD-10-CM | POA: Diagnosis present

## 2019-01-27 LAB — FIBRINOGEN: Fibrinogen: 489 mg/dL — ABNORMAL HIGH (ref 210–475)

## 2019-01-27 LAB — COMPREHENSIVE METABOLIC PANEL
ALT: 18 U/L (ref 0–44)
AST: 26 U/L (ref 15–41)
Albumin: 3.4 g/dL — ABNORMAL LOW (ref 3.5–5.0)
Alkaline Phosphatase: 64 U/L (ref 38–126)
Anion gap: 10 (ref 5–15)
BUN: 12 mg/dL (ref 8–23)
CO2: 24 mmol/L (ref 22–32)
Calcium: 8.5 mg/dL — ABNORMAL LOW (ref 8.9–10.3)
Chloride: 99 mmol/L (ref 98–111)
Creatinine, Ser: 0.77 mg/dL (ref 0.44–1.00)
GFR calc Af Amer: >60 mL/min (ref >60)
GFR calc non Af Amer: >60 mL/min (ref >60)
Glucose, Bld: 139 mg/dL — ABNORMAL HIGH (ref 70–99)
Potassium: 4 mmol/L (ref 3.5–5.1)
Sodium: 133 mmol/L — ABNORMAL LOW (ref 135–145)
Total Bilirubin: 0.3 mg/dL (ref 0.3–1.2)
Total Protein: 6.9 g/dL (ref 6.5–8.1)

## 2019-01-27 LAB — CBC WITH DIFFERENTIAL/PLATELET
Abs Immature Granulocytes: 0.03 10*3/uL (ref 0.00–0.07)
Basophils Absolute: 0 10*3/uL (ref 0.0–0.1)
Basophils Relative: 0 %
Eosinophils Absolute: 0 10*3/uL (ref 0.0–0.5)
Eosinophils Relative: 0 %
HCT: 37.7 % (ref 36.0–46.0)
Hemoglobin: 12.3 g/dL (ref 12.0–15.0)
Immature Granulocytes: 1 %
Lymphocytes Relative: 20 %
Lymphs Abs: 1.3 10*3/uL (ref 0.7–4.0)
MCH: 28.7 pg (ref 26.0–34.0)
MCHC: 32.6 g/dL (ref 30.0–36.0)
MCV: 88.1 fL (ref 80.0–100.0)
Monocytes Absolute: 0.4 10*3/uL (ref 0.1–1.0)
Monocytes Relative: 6 %
Neutro Abs: 4.5 10*3/uL (ref 1.7–7.7)
Neutrophils Relative %: 73 %
Platelets: 233 10*3/uL (ref 150–400)
RBC: 4.28 MIL/uL (ref 3.87–5.11)
RDW: 17.1 % — ABNORMAL HIGH (ref 11.5–15.5)
WBC: 6.2 10*3/uL (ref 4.0–10.5)
nRBC: 0 % (ref 0.0–0.2)

## 2019-01-27 LAB — LACTIC ACID, PLASMA: Lactic Acid, Venous: 1.4 mmol/L (ref 0.5–1.9)

## 2019-01-27 LAB — POC SARS CORONAVIRUS 2 AG -  ED: SARS Coronavirus 2 Ag: NEGATIVE

## 2019-01-27 LAB — SARS CORONAVIRUS 2 (TAT 6-24 HRS): SARS Coronavirus 2: POSITIVE — AB

## 2019-01-27 LAB — URINALYSIS, ROUTINE W REFLEX MICROSCOPIC
Bilirubin Urine: NEGATIVE
Glucose, UA: NEGATIVE mg/dL
Hgb urine dipstick: NEGATIVE
Ketones, ur: NEGATIVE mg/dL
Leukocytes,Ua: NEGATIVE
Nitrite: NEGATIVE
Protein, ur: 100 mg/dL — AB
Specific Gravity, Urine: 1.02 (ref 1.005–1.030)
pH: 5 (ref 5.0–8.0)

## 2019-01-27 LAB — TROPONIN I (HIGH SENSITIVITY): Troponin I (High Sensitivity): 5 ng/L (ref <18)

## 2019-01-27 LAB — D-DIMER, QUANTITATIVE: D-Dimer, Quant: 2.66 ug/mL-FEU — ABNORMAL HIGH (ref 0.00–0.50)

## 2019-01-27 LAB — PROCALCITONIN: Procalcitonin: <0.1 ng/mL

## 2019-01-27 LAB — LACTATE DEHYDROGENASE: LDH: 207 U/L — ABNORMAL HIGH (ref 98–192)

## 2019-01-27 LAB — FERRITIN: Ferritin: 261 ng/mL (ref 11–307)

## 2019-01-27 LAB — TRIGLYCERIDES: Triglycerides: 95 mg/dL (ref <150)

## 2019-01-27 LAB — C-REACTIVE PROTEIN: CRP: 6.2 mg/dL — ABNORMAL HIGH (ref <1.0)

## 2019-01-27 MED ORDER — RISPERIDONE 0.25 MG PO TABS
0.2500 mg | ORAL_TABLET | Freq: Two times a day (BID) | ORAL | Status: DC | PRN
  Administered 2019-01-31 – 2019-02-07 (×3): 0.25 mg via ORAL
  Filled 2019-01-27 (×6): qty 1

## 2019-01-27 MED ORDER — TRAZODONE HCL 50 MG PO TABS
100.0000 mg | ORAL_TABLET | Freq: Every day | ORAL | Status: DC
  Administered 2019-01-27 – 2019-01-30 (×4): 100 mg via ORAL
  Filled 2019-01-27: qty 2
  Filled 2019-01-27: qty 1
  Filled 2019-01-27 (×2): qty 2

## 2019-01-27 MED ORDER — SODIUM CHLORIDE 0.9 % IV SOLN
100.0000 mg | Freq: Every day | INTRAVENOUS | Status: AC
  Administered 2019-01-28 – 2019-01-31 (×4): 100 mg via INTRAVENOUS
  Filled 2019-01-27 (×5): qty 20

## 2019-01-27 MED ORDER — ENOXAPARIN SODIUM 40 MG/0.4ML ~~LOC~~ SOLN
40.0000 mg | SUBCUTANEOUS | Status: DC
  Administered 2019-01-27 – 2019-02-09 (×8): 40 mg via SUBCUTANEOUS
  Filled 2019-01-27 (×14): qty 0.4

## 2019-01-27 MED ORDER — ONDANSETRON 4 MG PO TBDP
4.0000 mg | ORAL_TABLET | Freq: Three times a day (TID) | ORAL | Status: DC | PRN
  Administered 2019-01-28: 4 mg via ORAL
  Filled 2019-01-27: qty 1

## 2019-01-27 MED ORDER — SODIUM CHLORIDE 0.9 % IV SOLN
1000.0000 mL | INTRAVENOUS | Status: DC
  Administered 2019-01-27: 1000 mL via INTRAVENOUS

## 2019-01-27 MED ORDER — DOCUSATE SODIUM 100 MG PO CAPS
100.0000 mg | ORAL_CAPSULE | Freq: Two times a day (BID) | ORAL | Status: DC
  Administered 2019-01-27 – 2019-02-10 (×28): 100 mg via ORAL
  Filled 2019-01-27 (×29): qty 1

## 2019-01-27 MED ORDER — DEXAMETHASONE SODIUM PHOSPHATE 10 MG/ML IJ SOLN
6.0000 mg | INTRAMUSCULAR | Status: AC
  Administered 2019-01-27 – 2019-02-05 (×10): 6 mg via INTRAVENOUS
  Filled 2019-01-27 (×10): qty 1

## 2019-01-27 MED ORDER — PANTOPRAZOLE SODIUM 40 MG PO TBEC
40.0000 mg | DELAYED_RELEASE_TABLET | Freq: Every day | ORAL | Status: DC
  Administered 2019-01-27 – 2019-02-10 (×15): 40 mg via ORAL
  Filled 2019-01-27 (×15): qty 1

## 2019-01-27 MED ORDER — LISINOPRIL 10 MG PO TABS
10.0000 mg | ORAL_TABLET | Freq: Every day | ORAL | Status: DC
  Administered 2019-01-27 – 2019-01-28 (×2): 10 mg via ORAL
  Filled 2019-01-27 (×2): qty 1

## 2019-01-27 MED ORDER — TEMAZEPAM 15 MG PO CAPS
15.0000 mg | ORAL_CAPSULE | Freq: Every day | ORAL | Status: DC
  Administered 2019-01-27 – 2019-01-30 (×4): 15 mg via ORAL
  Filled 2019-01-27 (×4): qty 1

## 2019-01-27 MED ORDER — DIAZEPAM 5 MG PO TABS
5.0000 mg | ORAL_TABLET | Freq: Three times a day (TID) | ORAL | Status: DC | PRN
  Administered 2019-01-31: 17:00:00 5 mg via ORAL
  Filled 2019-01-27: qty 1

## 2019-01-27 MED ORDER — IOHEXOL 350 MG/ML SOLN
75.0000 mL | Freq: Once | INTRAVENOUS | Status: AC | PRN
  Administered 2019-01-27: 75 mL via INTRAVENOUS

## 2019-01-27 MED ORDER — HYDROCODONE-ACETAMINOPHEN 5-325 MG PO TABS
1.0000 | ORAL_TABLET | ORAL | Status: DC | PRN
  Administered 2019-02-01 – 2019-02-08 (×7): 1 via ORAL
  Filled 2019-01-27 (×7): qty 1

## 2019-01-27 MED ORDER — SODIUM CHLORIDE 0.9 % IV SOLN: 1000.0000 mL | INTRAVENOUS | Status: DC

## 2019-01-27 MED ORDER — LETROZOLE 2.5 MG PO TABS
2.5000 mg | ORAL_TABLET | Freq: Every day | ORAL | Status: DC
  Administered 2019-01-29 – 2019-02-10 (×13): 2.5 mg via ORAL
  Filled 2019-01-27 (×15): qty 1

## 2019-01-27 MED ORDER — LUBIPROSTONE 24 MCG PO CAPS
24.0000 ug | ORAL_CAPSULE | Freq: Every day | ORAL | Status: DC
  Administered 2019-01-29 – 2019-02-10 (×13): 24 ug via ORAL
  Filled 2019-01-27 (×15): qty 1

## 2019-01-27 MED ORDER — BUPROPION HCL ER (XL) 150 MG PO TB24
150.0000 mg | ORAL_TABLET | Freq: Every day | ORAL | Status: DC
  Administered 2019-01-27 – 2019-02-10 (×15): 150 mg via ORAL
  Filled 2019-01-27 (×15): qty 1

## 2019-01-27 MED ORDER — AMLODIPINE BESYLATE 5 MG PO TABS
5.0000 mg | ORAL_TABLET | Freq: Every day | ORAL | Status: DC
  Administered 2019-01-27 – 2019-02-10 (×13): 5 mg via ORAL
  Filled 2019-01-27 (×15): qty 1

## 2019-01-27 MED ORDER — CYCLOBENZAPRINE HCL 10 MG PO TABS
5.0000 mg | ORAL_TABLET | Freq: Every day | ORAL | Status: DC
  Administered 2019-01-27 – 2019-01-30 (×4): 5 mg via ORAL
  Filled 2019-01-27 (×4): qty 1

## 2019-01-27 MED ORDER — LACTULOSE 10 GM/15ML PO SOLN
10.0000 g | Freq: Every day | ORAL | Status: DC
  Administered 2019-01-28 – 2019-02-10 (×14): 10 g via ORAL
  Filled 2019-01-27 (×12): qty 15
  Filled 2019-01-27: qty 30
  Filled 2019-01-27: qty 15

## 2019-01-27 MED ORDER — METFORMIN HCL 500 MG PO TABS
500.0000 mg | ORAL_TABLET | Freq: Every day | ORAL | Status: DC
  Administered 2019-01-28: 500 mg via ORAL
  Filled 2019-01-27: qty 1

## 2019-01-27 MED ORDER — MEMANTINE HCL 5 MG PO TABS
5.0000 mg | ORAL_TABLET | Freq: Two times a day (BID) | ORAL | Status: DC
  Administered 2019-01-27 – 2019-02-10 (×27): 5 mg via ORAL
  Filled 2019-01-27 (×31): qty 1

## 2019-01-27 MED ORDER — SODIUM CHLORIDE 0.9 % IV SOLN
200.0000 mg | Freq: Once | INTRAVENOUS | Status: AC
  Administered 2019-01-27: 200 mg via INTRAVENOUS
  Filled 2019-01-27: qty 40

## 2019-01-27 NOTE — H&P (Signed)
TRH H&P    Patient Demographics:    Kristen Colon, is a 69 y.o. female  MRN: [id number]  DOB - 04/26/1950  Admit Date - 01/27/2019  Referring MD/NP/PA:  Domenic Moras  Outpatient Primary MD for the patient is Everardo Beals, NP  Patient coming from: ALF  Chief complaint- dyspnea, covid-19 positive   HPI:    Kristen Colon  is a 69 y.o. female, w hypertension, dm2, ?, schizophrenia, dementia, anxiety, recently covid-19 positive at ALF apparently has c/o dyspnea and sent to ER for evaluation.  Pt is unable to provide much history at this time.   In ED,  T 100.2 P 123, R 16, Bp 121/75  Ox 98% on 2L,  Pox 88% on RA per ED  CXR IMPRESSION: No active disease.  Na 133, K 4.0, Bun 12, Creatinine 0.77, Ast 26, Alt 18, Alb 3.4 Wbc 6.2, Hgb `12.3, Plt 233 Lactic acid 1.4 LDH 207 D dimer 2.66 Ferritin 261 Fibrinogen 489 Crp 6.2 Tg 95  Pt will be admitted for acute respiratory failure with hypoxia and covid-19      Review of systems:    In addition to the HPI above,  No Fever-chills, No Headache, No changes with Vision or hearing, No problems swallowing food or Liquids, No Chest pain,  No Abdominal pain, No Nausea or Vomiting, bowel movements are regular, No Blood in stool or Urine, No dysuria, No new skin rashes or bruises, No new joints pains-aches,  No new weakness, tingling, numbness in any extremity, No recent weight gain or loss, No polyuria, polydypsia or polyphagia, No significant Mental Stressors.  All other systems reviewed and are negative.    Past History of the following :    Past Medical History:  Diagnosis Date  . Anxiety   . Dementia ([place])   . Dyspnea   . Headache   . Hypertension   . Schizophrenia ([place])    paranoid      Past Surgical History:  Procedure Laterality Date  . DIAGNOSTIC LAPAROSCOPY     pelvic mass Dr. Denman George 06-30-17  . LAPAROSCOPY N/A  06/30/2017   Procedure: LAPAROSCOPY DIAGNOSTIC WITH BIOPSIES;  Surgeon: Everitt Amber, MD;  Location: [facility];  Service: Gynecology;  Laterality: N/A;      Social History:      Social History   Tobacco Use  . Smoking status: Current Every Day Smoker    Packs/day: 1.00    Years: 5.00    Pack years: 5.00    Types: Cigarettes  . Smokeless tobacco: Never Used  Substance Use Topics  . Alcohol use: Not Currently       Family History :     Family History  Problem Relation Age of Onset  . Heart disease Mother   . Diabetes Sister        Home Medications:   Prior to Admission medications   Medication Sig Start Date End Date Taking? Authorizing Provider  acetaminophen (TYLENOL) 500 MG tablet Take 500 mg by mouth every 8 (eight) hours as needed for mild  pain.    [provider]  amLODipine (NORVASC) 5 MG tablet Take 1 tablet (5 mg total) by mouth daily. 09/24/16   Rai, Vernelle Emerald, MD  Ascorbic Acid (VITAMIN C) 1000 MG tablet Take 1,000 mg by mouth daily.    [provider]  buPROPion (WELLBUTRIN XL) 150 MG 24 hr tablet Take 150 mg by mouth daily.    [provider]  cyclobenzaprine (FLEXERIL) 5 MG tablet Take 5 mg by mouth daily. 12/10/18   [provider]  diazepam (VALIUM) 5 MG tablet Take 5 mg by mouth every 8 (eight) hours as needed for anxiety.    [provider]  fentaNYL (DURAGESIC) 50 MCG/HR Place 1 patch onto the skin as directed. Every 48 hours 12/07/18   [provider]  HYDROcodone-acetaminophen (NORCO/VICODIN) 5-325 MG tablet Take 1 tablet by mouth every 4 (four) hours as needed for moderate pain.    [provider]  INVEGA 6 MG 24 hr tablet Take 6 mg by mouth 2 (two) times daily.  05/28/17   [provider]  INVEGA SUSTENNA 156 MG/ML SUSP injection Inject 1 mL (156 mg total) into the muscle every 30 (thirty) days. 09/05/14   Dorie Rank, MD  lactulose (CHRONULAC) 10 GM/15ML solution Take 10 g by mouth  daily.  08/12/16   [provider]  letrozole (FEMARA) 2.5 MG tablet Take 2.5 mg by mouth daily.    [provider]  linaclotide (LINZESS) 290 MCG CAPS capsule Take 1 capsule (290 mcg total) by mouth daily. Patient not taking: Reported on 12/24/2018 04/16/17 06/26/18  Arrien, Jimmy Picket, MD  lisinopril (PRINIVIL,ZESTRIL) 10 MG tablet Take 10 mg by mouth daily.     [provider]  lubiprostone (AMITIZA) 24 MCG capsule Take 24 mcg by mouth daily.    [provider]  memantine (NAMENDA) 5 MG tablet Take 5 mg by mouth 2 (two) times daily.    [provider]  metFORMIN (GLUCOPHAGE) 500 MG tablet Take 500 mg by mouth daily with breakfast.     [provider]  Multiple Vitamin (MULTIVITAMIN WITH MINERALS) TABS tablet Take 1 tablet by mouth daily.    [provider]  ondansetron (ZOFRAN-ODT) 4 MG disintegrating tablet Take 4 mg by mouth every 8 (eight) hours as needed for nausea/vomiting. 12/11/18   [provider]  pantoprazole (PROTONIX) 40 MG tablet Take 40 mg by mouth daily. 12/10/18   [provider]  risperiDONE (RISPERDAL) 0.25 MG tablet Take 0.25 mg by mouth 2 (two) times daily as needed (agitation).     [provider]  STOOL SOFTENER 100 MG capsule Take 100 mg by mouth 2 (two) times daily. 11/02/18   [provider]  temazepam (RESTORIL) 15 MG capsule Take 15 mg by mouth at bedtime.    [provider]  thiamine 100 MG tablet Take 1 tablet (100 mg total) by mouth daily. Patient not taking: Reported on 12/24/2018 09/24/16   Rai, Vernelle Emerald, MD  traZODone (DESYREL) 50 MG tablet Take 100 mg by mouth at bedtime. 12/07/18   [provider]     Allergies:     Allergies  Allergen Reactions  . Penicillins Other (See Comments)    edema  . Percocet [Oxycodone-Acetaminophen] Itching     Physical Exam:   Vitals  Blood pressure 121/75, pulse (!) 123, temperature (!) 100.9 F (38.3  C), resp. rate 16, height _0  (1.651 m), SpO2 98 %.  1.  General: axox1 (person)  2. Psychiatric: Euthymic,  Slightly shy  3. Neurologic: Nonfocal, cn2-12 intact, reflexes 2+ symmetric, diffuse with no clonus, motor 5/5 in all 4 exst  4. HEENMT:  Anicteric, pupils 1.30m symmetric, direct, consensual, intact Neck: no jvd  5. Respiratory : CTAB  6. Cardiovascular : Tachy s1, s2, no m/g/r  7. Gastrointestinal:  Abd: soft, nt, nd, +bs  8. Skin:  Ext: no c/c/e, no rash  9.Musculoskeletal:  Good ROM    Data Review:    CBC Recent Labs  Lab 01/27/19 1730  WBC 6.2  HGB 12.3  HCT 37.7  PLT 233  MCV 88.1  MCH 28.7  MCHC 32.6  RDW 17.1*  LYMPHSABS 1.3  MONOABS 0.4  EOSABS 0.0  BASOSABS 0.0   ------------------------------------------------------------------------------------------------------------------  Results for orders placed or performed during the hospital encounter of 01/27/19 (from the past 48 hour(s))  Lactic acid, plasma     Status: None   Collection Time: 01/27/19  5:30 PM  Result Value Ref Range   Lactic Acid, Venous 1.4 0.5 - 1.9 mmol/L    Comment: Performed at [facility], [place] [id number] CBC WITH DIFFERENTIAL     Status: Abnormal   Collection Time: 01/27/19  5:30 PM  Result Value Ref Range   WBC 6.2 4.0 - 10.5 K/uL   RBC 4.28 3.87 - 5.11 MIL/uL   Hemoglobin 12.3 12.0 - 15.0 g/dL   HCT 37.7 36.0 - 46.0 %   MCV 88.1 80.0 - 100.0 fL   MCH 28.7 26.0 - 34.0 pg   MCHC 32.6 30.0 - 36.0 g/dL   RDW 17.1 (H) 11.5 - 15.5 %   Platelets 233 150 - 400 K/uL   nRBC 0.0 0.0 - 0.2 %   Neutrophils Relative % 73 %   Neutro Abs 4.5 1.7 - 7.7 K/uL   Lymphocytes Relative 20 %   Lymphs Abs 1.3 0.7 - 4.0 K/uL   Monocytes Relative 6 %   Monocytes Absolute 0.4 0.1 - 1.0 K/uL   Eosinophils Relative 0 %   Eosinophils Absolute 0.0 0.0 - 0.5 K/uL   Basophils Relative 0 %   Basophils Absolute 0.0 0.0 - 0.1 K/uL   Immature  Granulocytes 1 %   Abs Immature Granulocytes 0.03 0.00 - 0.07 K/uL    Comment: Performed at [facility] [place] [id number] Comprehensive metabolic panel     Status: Abnormal   Collection Time: 01/27/19  5:30 PM  Result Value Ref Range   Sodium 133 (L) 135 - 145 mmol/L   Potassium 4.0 3.5 - 5.1 mmol/L   Chloride 99 98 - 111 mmol/L   CO2 24 22 - 32 mmol/L   Glucose, Bld 139 (H) 70 - 99 mg/dL   BUN 12 8 - 23 mg/dL   Creatinine, Ser 0.77 0.44 - 1.00 mg/dL   Calcium 8.5 (L) 8.9 - 10.3 mg/dL   Total Protein 6.9 6.5 - 8.1 g/dL   Albumin 3.4 (L) 3.5 - 5.0 g/dL   AST 26 15 - 41 U/L   ALT 18 0 - 44 U/L   Alkaline Phosphatase 64 38 - 126 U/L   Total Bilirubin 0.3 0.3 - 1.2 mg/dL   GFR calc non Af Amer >60 >60 mL/min   GFR calc Af Amer >60 >60 mL/min   Anion gap 10 5 - 15    Comment: Performed at [facility], [place] [address]  D-dimer, quantitative     Status: Abnormal   Collection Time: 01/27/19  5:30 PM  Result Value Ref Range   D-Dimer, Quant 2.66 (H) 0.00 - 0.50 ug/mL-FEU    Comment: (NOTE) At the manufacturer cut-off of 0.50 ug/mL FEU, this assay has been documented to exclude PE with a sensitivity and negative predictive value of 97 to 99%.  At this time, this assay has not been approved by the FDA to exclude DVT/VTE. Results should be correlated with clinical presentation. Performed at [facility], [place] [address], [place], [place] [address]   Lactate dehydrogenase     Status: Abnormal   Collection Time: 01/27/19  5:30 PM  Result Value Ref Range   LDH 207 (H) 98 - 192 U/L    Comment: Performed at [facility] [address], [place], [place] [address]  Ferritin     Status: None   Collection Time: 01/27/19  5:30 PM  Result Value Ref Range   Ferritin 261 11 - 307 ng/mL    Comment: Performed at [facility] [address], [place], [place] [address]  Fibrinogen     Status: Abnormal    Collection Time: 01/27/19  5:30 PM  Result Value Ref Range   Fibrinogen 489 (H) 210 - 475 mg/dL    Comment: Performed at [facility] [address], [place], [place] [address]  C-reactive protein     Status: Abnormal   Collection Time: 01/27/19  5:30 PM  Result Value Ref Range   CRP 6.2 (H) <1.0 mg/dL    Comment: Performed at [facility] [address], [place],  [address]  Triglycerides     Status: None   Collection Time: 01/27/19  5:30 PM  Result Value Ref Range   Triglycerides 95 <150 mg/dL    Comment: Performed at [facility] [address] [place],  [address]  POC SARS Coronavirus 2 Ag-ED - Nasal Swab (BD Veritor Kit)     Status: None   Collection Time: 01/27/19  5:58 PM  Result Value Ref Range   SARS Coronavirus 2 Ag NEGATIVE NEGATIVE    Comment: (NOTE) SARS-CoV-2 antigen NOT DETECTED.  Negative results are presumptive.  Negative results do not preclude SARS-CoV-2 infection and should not be used as the sole basis for treatment or other patient management decisions, including infection  control decisions, particularly in the presence of clinical signs and  symptoms consistent with COVID-19, or in those who have been in contact with the virus.  Negative results must be combined with clinical observations, patient history, and epidemiological information. The expected result is Negative. Fact Sheet for Patients: PodPark.tn Fact Sheet for Healthcare Providers: GiftContent.is This test is not yet approved or cleared by the [place] FDA and  has been authorized for detection and/or diagnosis of SARS-CoV-2 by FDA under an Emergency Use Authorization (EUA).  This EUA will remain in effect (meaning this test can be used) for the duration of  the COVID-19 de claration under Section 564(b)(1) of the Act, 21 U.S.C. section 360bbb-3(b)(1), unless the authorization is terminated or  revoked sooner.     Chemistries  Recent Labs  Lab 01/27/19 1730  NA 133*  K 4.0  CL 99  CO2 24  GLUCOSE 139*  BUN 12  CREATININE 0.77  CALCIUM 8.5*  AST 26  ALT 18  ALKPHOS 64  BILITOT 0.3   ------------------------------------------------------------------------------------------------------------------  ------------------------------------------------------------------------------------------------------------------ GFR: CrCl cannot be calculated (Unknown  ideal weight.). Liver Function Tests: Recent Labs  Lab 01/27/19 1730  AST 26  ALT 18  ALKPHOS 64  BILITOT 0.3  PROT 6.9  ALBUMIN 3.4*   No results for input(s): LIPASE, AMYLASE in the last 168 hours. No results for input(s): AMMONIA in the last 168 hours. Coagulation Profile: No results for input(s): INR, PROTIME in the last 168 hours. Cardiac Enzymes: No results for input(s): CKTOTAL, CKMB, CKMBINDEX, TROPONINI in the last 168 hours. BNP (last 3 results) No results for input(s): PROBNP in the last 8760 hours. HbA1C: No results for input(s): HGBA1C in the last 72 hours. CBG: No results for input(s): GLUCAP in the last 168 hours. Lipid Profile: Recent Labs    01/27/19 1730  TRIG 95   Thyroid Function Tests: No results for input(s): TSH, T4TOTAL, FREET4, T3FREE, THYROIDAB in the last 72 hours. Anemia Panel: Recent Labs    01/27/19 1730  FERRITIN 261    --------------------------------------------------------------------------------------------------------------- Urine analysis:    Component Value Date/Time   COLORURINE YELLOW 12/24/2018 1152   APPEARANCEUR CLEAR 12/24/2018 1152   LABSPEC 1.017 12/24/2018 1152   PHURINE 6.0 12/24/2018 1152   GLUCOSEU NEGATIVE 12/24/2018 1152   HGBUR NEGATIVE 12/24/2018 1152   BILIRUBINUR NEGATIVE 12/24/2018 1152   KETONESUR NEGATIVE 12/24/2018 1152   PROTEINUR NEGATIVE 12/24/2018 1152   UROBILINOGEN 0.2 10/23/2014 1102   NITRITE NEGATIVE 12/24/2018 1152    LEUKOCYTESUR SMALL (A) 12/24/2018 1152      Imaging Results:    DG Chest Port 1 View  Result Date: 01/27/2019 CLINICAL DATA:  Fever and shortness of breath, possible COVID-19 infection EXAM: PORTABLE CHEST 1 VIEW COMPARISON:  07/29/2017 FINDINGS: Cardiac shadows within normal limits. The lungs are well aerated bilaterally. No focal infiltrate or sizable effusion is seen. No bony abnormality is noted. IMPRESSION: No active disease. Electronically Signed   By: Inez Catalina M.D.   On: 01/27/2019 17:27       Assessment & Plan:    Active Problems:   Hypertension, uncontrolled   Diabetes mellitus type 2 in nonobese (HCC)   Acute respiratory failure with hypoxia (HCC)   COVID-19 virus infection  Acute respiratory failure with hypoxia Covid-19 Dexamethasone 54m iv qday Remdesivir pharmacy consult Cbc, cmp in am  Tachycardia, + D dimer CTA chest r/o PE Check tsh Consider cardiac echo if not improving  Hyponatremia Hydrate gently with ns iv Check cmp in am  Moderate protein calorie malnutrition prostat 30 mL po bid  Acute lower UTI Urine culture cipro pharmacy to dose  Hypertension Cont Amlodipine 589mpo qday Cont Lisinopril 1061mo qday  Dementia Cont Namenda  Anxiety/ Schizophrenia Cont Wellbutrin Cont Risperidone Cont Valium 5mg107m q8h prn  Consider restart Invega, hasn't had in 1 month Psychiatry consult placed in computer  Dm2 Cont Metformin 500mg54mqday fsbs ac and qhs, ISS  ??H/o Stage 3 ovarian cancer Cont Femara 2.5mg p25mday  Constipation Cont Lactulose Cont Amitiza    DVT Prophylaxis-   Lovenox - SCDs   AM Labs Ordered, also please review Full Orders  Family Communication: Admission, patients condition and plan of care including tests being ordered have been discussed with the patient who indicate understanding and agree with the plan and Code Status.  Code Status:  FULL CODE per patient,  Notified her sister that [facility]: In[facility]t: Based on patients clinical presentation and evaluation of above clinical data, I have made determination that patient meets Inpatient criteria at this time.  Time spent in minutes : 70   Jani Gravel M.D on 01/27/2019 at 7:21 PM

## 2019-01-27 NOTE — Progress Notes (Signed)
[facility] Documentation  Pt is a current hospice pt with [facility]. Liaison team will follow pt during ED visit and if/when pt is admitted.   Please call [facility] with any hospice related questions.   Thank you,  Freddie Breech, RN [facility] Liaison [phone]

## 2019-01-27 NOTE — ED Provider Notes (Signed)
[facility] EMERGENCY DEPARTMENT Provider Note   CSN: [id number] Arrival date & time: 01/27/19  1614     History Chief Complaint  Patient presents with  . Covid +    Kristen Colon is a 69 y.o. female.  The history is provided by the patient, a relative and medical records. No language interpreter was used.       69 year old female with history of dementia, schizophrenia, hypertension, ovarian cancer currently under the care of hospice, recently diagnosed with COVID-19 yesterday from an outside hospital presented to ED via EMS from home for evaluation of recently diagnosed Covid.  History obtained through daughter via phone.  Per daughter, for the past 2 to 3 days patient has been off her normal baseline.  She appears to be more confused, eating less, and had an occasional cough.  The daycare center that she is currently attending had a large COVID-19 outbreak and patient was tested for COVID-19 yesterday which came back positive.  Since then daughter felt that her symptom has become progressively worse, she appears to have trouble breathing, not acting herself, and appears to be weaker than usual.  No report of any vomiting or diarrhea.  At this time patient denies any active pain.  She is a poor historian.  She has history of dementia but daughter felt her dementia has been worse within the past few days.  Past Medical History:  Diagnosis Date  . Anxiety   . Dementia ([place])   . Dyspnea   . Headache   . Hypertension   . Schizophrenia [facility])    paranoid    Patient Active Problem List   Diagnosis Date Noted  . Pelvic mass in female 06/30/2017  . 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 Elevated CA-125 05/12/2017  . Peritoneal carcinomatosis ([place]) 05/12/2017  . 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 Ovarian mass 09/20/2016  . Non-intractable vomiting with nausea 09/20/2016  . Hypertension, uncontrolled 09/20/2016  . Diabetes mellitus type 2 in nonobese  ([place]) 09/20/2016  . Acute encephalopathy 09/19/2016  . Schizoaffective disorder, chronic condition with acute exacerbation ([place])   . Schizophrenia, undifferentiated ([place]) 06/21/2014  . Delusional disorder ([place])   . 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 Gallbladder polyp 07/21/2011  . Liver mass 07/21/2011    Past Surgical History:  Procedure Laterality Date  . DIAGNOSTIC LAPAROSCOPY     pelvic mass Dr. Denman George 06-30-17  . LAPAROSCOPY N/A 06/30/2017   Procedure: LAPAROSCOPY DIAGNOSTIC WITH BIOPSIES;  Surgeon: Everitt Amber, MD;  Location: [facility] ORS;  Service: Gynecology;  Laterality: N/A;     OB History   No obstetric history on file.    Obstetric Comments  Pt is unsure how many times she has been pregnant and do not know how many children because she did not raise any of them.        Family History  Problem Relation Age of Onset  . Heart disease Mother   . Diabetes Sister     Social History   Tobacco Use  . Smoking status: Current Every Day Smoker    Packs/day: 1.00    Years: 5.00    Pack years: 5.00    Types: Cigarettes  . Smokeless tobacco: Never Used  Substance Use Topics  . Alcohol use: Not Currently  . Drug use: Not Currently    Types: Marijuana    Home Medications Prior to Admission medications   Medication Sig Start Date End Date Taking? Authorizing Provider  acetaminophen (TYLENOL) 500 MG  tablet Take 500 mg by mouth every 8 (eight) hours as needed for mild pain.    [provider]  amLODipine (NORVASC) 5 MG tablet Take 1 tablet (5 mg total) by mouth daily. 09/24/16   Rai, Vernelle Emerald, MD  Ascorbic Acid (VITAMIN C) 1000 MG tablet Take 1,000 mg by mouth daily.    [provider]  buPROPion (WELLBUTRIN XL) 150 MG 24 hr tablet Take 150 mg by mouth daily.    [provider]  cyclobenzaprine (FLEXERIL) 5 MG tablet Take 5 mg by mouth daily. 12/10/18   [provider]  diazepam (VALIUM) 5 MG tablet Take 5 mg by mouth every 8 (eight) hours as needed for  anxiety.    [provider]  fentaNYL (DURAGESIC) 50 MCG/HR Place 1 patch onto the skin as directed. Every 48 hours 12/07/18   [provider]  HYDROcodone-acetaminophen (NORCO/VICODIN) 5-325 MG tablet Take 1 tablet by mouth every 4 (four) hours as needed for moderate pain.    [provider]  INVEGA 6 MG 24 hr tablet Take 6 mg by mouth 2 (two) times daily.  05/28/17   [provider]  INVEGA SUSTENNA 156 MG/ML SUSP injection Inject 1 mL (156 mg total) into the muscle every 30 (thirty) days. 09/05/14   Dorie Rank, MD  lactulose (CHRONULAC) 10 GM/15ML solution Take 10 g by mouth daily.  08/12/16   [provider]  letrozole (FEMARA) 2.5 MG tablet Take 2.5 mg by mouth daily.    [provider]  linaclotide (LINZESS) 290 MCG CAPS capsule Take 1 capsule (290 mcg total) by mouth daily. Patient not taking: Reported on 12/24/2018 04/16/17 06/26/18  Arrien, Jimmy Picket, MD  lisinopril (PRINIVIL,ZESTRIL) 10 MG tablet Take 10 mg by mouth daily.     [provider]  lubiprostone (AMITIZA) 24 MCG capsule Take 24 mcg by mouth daily.    [provider]  memantine (NAMENDA) 5 MG tablet Take 5 mg by mouth 2 (two) times daily.    [provider]  metFORMIN (GLUCOPHAGE) 500 MG tablet Take 500 mg by mouth daily with breakfast.     [provider]  Multiple Vitamin (MULTIVITAMIN WITH MINERALS) TABS tablet Take 1 tablet by mouth daily.    [provider]  ondansetron (ZOFRAN-ODT) 4 MG disintegrating tablet Take 4 mg by mouth every 8 (eight) hours as needed for nausea/vomiting. 12/11/18   [provider]  pantoprazole (PROTONIX) 40 MG tablet Take 40 mg by mouth daily. 12/10/18   [provider]  risperiDONE (RISPERDAL) 0.25 MG tablet Take 0.25 mg by mouth 2 (two) times daily as needed (agitation).     [provider]  STOOL SOFTENER 100 MG capsule Take 100 mg by mouth 2 (two) times daily. 11/02/18    [provider]  temazepam (RESTORIL) 15 MG capsule Take 15 mg by mouth at bedtime.    [provider]  thiamine 100 MG tablet Take 1 tablet (100 mg total) by mouth daily. Patient not taking: Reported on 12/24/2018 09/24/16   Rai, Vernelle Emerald, MD  traZODone (DESYREL) 50 MG tablet Take 100 mg by mouth at bedtime. 12/07/18   [provider]    Allergies    Penicillins and Percocet [oxycodone-acetaminophen]  Review of Systems   Review of Systems  Unable to perform ROS: Dementia    Physical Exam Updated Vital Signs BP 121/75 (BP Location: Right Arm)   Pulse (!) 123   Temp 100.2 F (37.9 C) (Oral)  Resp 16   LMP  (LMP Unknown) Comment: years ago per Pt  SpO2 98%   Physical Exam Vitals and nursing note reviewed.  Constitutional:      General: She is not in acute distress.    Appearance: She is well-developed.  HENT:     Head: Atraumatic.  Eyes:     Conjunctiva/sclera: Conjunctivae normal.  Cardiovascular:     Rate and Rhythm: Tachycardia present.  Pulmonary:     Breath sounds: Rales present.  Abdominal:     Palpations: Abdomen is soft.     Tenderness: There is no abdominal tenderness.  Musculoskeletal:     Cervical back: Neck supple.  Skin:    Findings: No rash.  Neurological:     Mental Status: She is alert.     Comments: Alert to self, unable to tell place, time, or situation. Normal equal strength to bilateral upper and lower extremities with intact distal pulses.     ED Results / Procedures / Treatments   Labs (all labs ordered are listed, but only abnormal results are displayed) Labs Reviewed  CBC WITH DIFFERENTIAL/PLATELET - Abnormal; Notable for the following components:      Result Value   RDW 17.1 (*)    All other components within normal limits  COMPREHENSIVE METABOLIC PANEL - Abnormal; Notable for the following components:   Sodium 133 (*)    Glucose, Bld 139 (*)    Calcium 8.5 (*)    Albumin 3.4 (*)    All other components  within normal limits  D-DIMER, QUANTITATIVE (NOT AT [facility] - Abnormal; Notable for the following components:   D-Dimer, Quant 2.66 (*)    All other components within normal limits  LACTATE DEHYDROGENASE - Abnormal; Notable for the following components:   LDH 207 (*)    All other components within normal limits  FIBRINOGEN - Abnormal; Notable for the following components:   Fibrinogen 489 (*)    All other components within normal limits  C-REACTIVE PROTEIN - Abnormal; Notable for the following components:   CRP 6.2 (*)    All other components within normal limits  SARS CORONAVIRUS 2 (TAT 6-24 HRS)  CULTURE, BLOOD (ROUTINE X 2)  CULTURE, BLOOD (ROUTINE X 2)  LACTIC ACID, PLASMA  FERRITIN  TRIGLYCERIDES  LACTIC ACID, PLASMA  PROCALCITONIN  URINALYSIS, ROUTINE W REFLEX MICROSCOPIC  POC SARS CORONAVIRUS 2 AG -  ED    EKG EKG Interpretation  Date/Time:  Thursday January 27 2019 16:41:17 EST Ventricular Rate:  127 PR Interval:    QRS Duration: 77 QT Interval:  296 QTC Calculation: 431 R Axis:   37 Text Interpretation: Sinus tachycardia Low voltage, extremity and precordial leads Since last tracing 04/17/2017 Rate faster Confirmed by Daleen Bo [phone]) on 01/27/2019 4:46:02 PM   Radiology DG Chest Port 1 View  Result Date: 01/27/2019 CLINICAL DATA:  Fever and shortness of breath, possible COVID-19 infection EXAM: PORTABLE CHEST 1 VIEW COMPARISON:  07/29/2017 FINDINGS: Cardiac shadows within normal limits. The lungs are well aerated bilaterally. No focal infiltrate or sizable effusion is seen. No bony abnormality is noted. IMPRESSION: No active disease. Electronically Signed   By: Inez Catalina M.D.   On: 01/27/2019 17:27    Procedures .Critical Care Performed by: Domenic Moras, PA-C Authorized by: Domenic Moras, PA-C   Critical care provider statement:    Critical care time (minutes):  45   Critical care was time spent personally by me on the following activities:  Discussions  with consultants, evaluation  of patient's response to treatment, examination of patient, ordering and performing treatments and interventions, ordering and review of laboratory studies, ordering and review of radiographic studies, pulse oximetry, re-evaluation of patient's condition, obtaining history from patient or surrogate and review of old charts   (including critical care time)  Medications Ordered in ED Medications  0.9 %  sodium chloride infusion (has no administration in time range)    ED Course  I have reviewed the triage vital signs and the nursing notes.  Pertinent labs & imaging results that were available during my care of the patient were reviewed by me and considered in my medical decision making (see chart for details).  Clinical Course as of Jan 26 1900  Thu Jan 27, 2019  1803 DG Chest [place] 1 View [EW]    Clinical Course User Index [EW] Daleen Bo, MD   MDM Rules/Calculators/A&P                      BP 121/75 (BP Location: Right Arm)   Pulse (!) 123   Temp (!) 100.9 F (38.3 C)   Resp 16   Ht 5\' 5"  (1.651 m)   LMP  (LMP Unknown) Comment: years ago per Pt  SpO2 98%   BMI 18.12 kg/m   Final Clinical Impression(s) / ED Diagnoses Final diagnoses:  Acute respiratory failure due to COVID-19 [facility])    Rx / DC Orders ED Discharge Orders    None     5:17 PM Patient recently test positive COVID-19 from an outside hospital when she attends an adult daycare with a large Covid outbreak.  She presenting with low-grade temperature of 100.2, is tachycardic with heart rate of 123 with faint crackles on her lung base.  Patient however does not require supplemental oxygen at this time satting at 98% on room air.  Daughter report her O2 sat was at 90%'s on room air initially and patient was placed on 2 L of supplemental oxygen.  We will monitor her oxygen level status closely.  Work-up initiated.  5:42 PM We have monitored pt's O2 on RA but unfortunately without  supplemental O2 her sats drops to 88% with good wave form.  I have initiate supplemental O2 at 2L which improves her O2 to 95%.  Anticipate admission for acute respiratory failure 2/2 COVID-19.  I did discuss plan with pt's daughter through the phone.  She is full code.  Care discussed with Dr. Eulis Foster.   7:19 PM Appreciate consultation from [facility] Dr. Maudie Mercury who agrees to see and admit pt for further management.   Kristen Colon was evaluated in Emergency Department on 01/27/2019 for the symptoms described in the history of present illness. She was evaluated in the context of the global COVID-19 pandemic, which necessitated consideration that the patient might be at risk for infection with the SARS-CoV-2 virus that causes COVID-19. Institutional protocols and algorithms that pertain to the evaluation of patients at risk for COVID-19 are in a state of rapid change based on information released by regulatory bodies including the CDC and federal and state organizations. These policies and algorithms were followed during the patient's care in the ED.    Domenic Moras, PA-C 01/27/19 1920    Daleen Bo, MD 01/31/19 [phone]

## 2019-01-27 NOTE — ED Provider Notes (Signed)
  Face-to-face evaluation   History: She presents for evaluation of fever and shortness of breath.  She is reportedly Covid positive based on outpatient testing.  Patient is unable to give any history.  She reportedly attends a daycare where several people have COVID-19.  Physical exam: Alert elderly female.  She is now on room air, currently at 6: 00 p.m.  At this time oxygen saturation is 88%, low.  Pulse elevated, left wrist, 120/min.  No respiratory distress or stridor.   Medical screening examination/treatment/procedure(s) were conducted as a shared visit with non-physician practitioner(s) and myself.  I personally evaluated the patient during the encounter    Daleen Bo, MD 01/31/19 1635

## 2019-01-27 NOTE — ED Triage Notes (Signed)
Pt to ED from home.  Daughter st's pt was dx with Covid yesterday today pt has had elevated temp and shortness of breath.  Pt was given Tylenol 1,000mg  at 3pm today

## 2019-01-28 DIAGNOSIS — R05 Cough: Secondary | ICD-10-CM | POA: Diagnosis not present

## 2019-01-28 DIAGNOSIS — C562 Malignant neoplasm of left ovary: Secondary | ICD-10-CM | POA: Diagnosis not present

## 2019-01-28 DIAGNOSIS — J9601 Acute respiratory failure with hypoxia: Secondary | ICD-10-CM | POA: Diagnosis not present

## 2019-01-28 DIAGNOSIS — Z681 Body mass index (BMI) 19 or less, adult: Secondary | ICD-10-CM | POA: Diagnosis not present

## 2019-01-28 DIAGNOSIS — J1282 Pneumonia due to Coronavirus disease 2019: Secondary | ICD-10-CM | POA: Diagnosis not present

## 2019-01-28 DIAGNOSIS — Z8249 Family history of ischemic heart disease and other diseases of the circulatory system: Secondary | ICD-10-CM | POA: Diagnosis not present

## 2019-01-28 DIAGNOSIS — E871 Hypo-osmolality and hyponatremia: Secondary | ICD-10-CM | POA: Diagnosis not present

## 2019-01-28 DIAGNOSIS — N390 Urinary tract infection, site not specified: Secondary | ICD-10-CM | POA: Diagnosis not present

## 2019-01-28 DIAGNOSIS — Z79811 Long term (current) use of aromatase inhibitors: Secondary | ICD-10-CM | POA: Diagnosis not present

## 2019-01-28 DIAGNOSIS — E440 Moderate protein-calorie malnutrition: Secondary | ICD-10-CM | POA: Diagnosis not present

## 2019-01-28 DIAGNOSIS — K5900 Constipation, unspecified: Secondary | ICD-10-CM | POA: Diagnosis not present

## 2019-01-28 DIAGNOSIS — Z8543 Personal history of malignant neoplasm of ovary: Secondary | ICD-10-CM | POA: Diagnosis not present

## 2019-01-28 DIAGNOSIS — I10 Essential (primary) hypertension: Secondary | ICD-10-CM | POA: Diagnosis not present

## 2019-01-28 DIAGNOSIS — F200 Paranoid schizophrenia: Secondary | ICD-10-CM | POA: Diagnosis not present

## 2019-01-28 DIAGNOSIS — Z833 Family history of diabetes mellitus: Secondary | ICD-10-CM | POA: Diagnosis not present

## 2019-01-28 DIAGNOSIS — E861 Hypovolemia: Secondary | ICD-10-CM | POA: Diagnosis not present

## 2019-01-28 DIAGNOSIS — Z885 Allergy status to narcotic agent status: Secondary | ICD-10-CM | POA: Diagnosis not present

## 2019-01-28 DIAGNOSIS — A4189 Other specified sepsis: Secondary | ICD-10-CM | POA: Diagnosis not present

## 2019-01-28 DIAGNOSIS — Z880 Allergy status to penicillin: Secondary | ICD-10-CM | POA: Diagnosis not present

## 2019-01-28 DIAGNOSIS — U071 COVID-19: Secondary | ICD-10-CM | POA: Diagnosis not present

## 2019-01-28 DIAGNOSIS — Z66 Do not resuscitate: Secondary | ICD-10-CM | POA: Diagnosis not present

## 2019-01-28 DIAGNOSIS — F17210 Nicotine dependence, cigarettes, uncomplicated: Secondary | ICD-10-CM | POA: Diagnosis not present

## 2019-01-28 DIAGNOSIS — Z7984 Long term (current) use of oral hypoglycemic drugs: Secondary | ICD-10-CM | POA: Diagnosis not present

## 2019-01-28 DIAGNOSIS — F0390 Unspecified dementia without behavioral disturbance: Secondary | ICD-10-CM | POA: Diagnosis not present

## 2019-01-28 DIAGNOSIS — G9341 Metabolic encephalopathy: Secondary | ICD-10-CM | POA: Diagnosis not present

## 2019-01-28 LAB — CBC WITH DIFFERENTIAL/PLATELET
Abs Immature Granulocytes: 0.02 10*3/uL (ref 0.00–0.07)
Basophils Absolute: 0 10*3/uL (ref 0.0–0.1)
Basophils Relative: 0 %
Eosinophils Absolute: 0 10*3/uL (ref 0.0–0.5)
Eosinophils Relative: 0 %
HCT: 37.2 % (ref 36.0–46.0)
Hemoglobin: 11.9 g/dL — ABNORMAL LOW (ref 12.0–15.0)
Immature Granulocytes: 0 %
Lymphocytes Relative: 15 %
Lymphs Abs: 1 10*3/uL (ref 0.7–4.0)
MCH: 28.5 pg (ref 26.0–34.0)
MCHC: 32 g/dL (ref 30.0–36.0)
MCV: 89 fL (ref 80.0–100.0)
Monocytes Absolute: 0.2 10*3/uL (ref 0.1–1.0)
Monocytes Relative: 3 %
Neutro Abs: 5.5 10*3/uL (ref 1.7–7.7)
Neutrophils Relative %: 82 %
Platelets: 242 10*3/uL (ref 150–400)
RBC: 4.18 MIL/uL (ref 3.87–5.11)
RDW: 16.9 % — ABNORMAL HIGH (ref 11.5–15.5)
WBC: 6.8 10*3/uL (ref 4.0–10.5)
nRBC: 0 % (ref 0.0–0.2)

## 2019-01-28 LAB — COMPREHENSIVE METABOLIC PANEL
ALT: 19 U/L (ref 0–44)
AST: 24 U/L (ref 15–41)
Albumin: 2.9 g/dL — ABNORMAL LOW (ref 3.5–5.0)
Alkaline Phosphatase: 67 U/L (ref 38–126)
Anion gap: 10 (ref 5–15)
BUN: 11 mg/dL (ref 8–23)
CO2: 22 mmol/L (ref 22–32)
Calcium: 8.3 mg/dL — ABNORMAL LOW (ref 8.9–10.3)
Chloride: 101 mmol/L (ref 98–111)
Creatinine, Ser: 0.71 mg/dL (ref 0.44–1.00)
GFR calc Af Amer: >60 mL/min (ref >60)
GFR calc non Af Amer: >60 mL/min (ref >60)
Glucose, Bld: 181 mg/dL — ABNORMAL HIGH (ref 70–99)
Potassium: 4.1 mmol/L (ref 3.5–5.1)
Sodium: 133 mmol/L — ABNORMAL LOW (ref 135–145)
Total Bilirubin: 0.2 mg/dL — ABNORMAL LOW (ref 0.3–1.2)
Total Protein: 6.6 g/dL (ref 6.5–8.1)

## 2019-01-28 LAB — CBG MONITORING, ED
Glucose-Capillary: 186 mg/dL — ABNORMAL HIGH (ref 70–99)
Glucose-Capillary: 123 mg/dL — ABNORMAL HIGH (ref 70–99)

## 2019-01-28 LAB — TROPONIN I (HIGH SENSITIVITY): Troponin I (High Sensitivity): 4 ng/L (ref <18)

## 2019-01-28 LAB — GLUCOSE, CAPILLARY
Glucose-Capillary: 86 mg/dL (ref 70–99)
Glucose-Capillary: 107 mg/dL — ABNORMAL HIGH (ref 70–99)

## 2019-01-28 LAB — HIV ANTIBODY (ROUTINE TESTING W REFLEX): HIV Screen 4th Generation wRfx: NONREACTIVE

## 2019-01-28 MED ORDER — LEVALBUTEROL TARTRATE 45 MCG/ACT IN AERO
2.0000 | INHALATION_SPRAY | Freq: Three times a day (TID) | RESPIRATORY_TRACT | Status: DC
  Administered 2019-01-28 – 2019-02-09 (×34): 2 via RESPIRATORY_TRACT
  Filled 2019-01-28 (×2): qty 15

## 2019-01-28 MED ORDER — ASCORBIC ACID 500 MG PO TABS
500.0000 mg | ORAL_TABLET | Freq: Every day | ORAL | Status: DC
  Administered 2019-01-28 – 2019-02-10 (×14): 500 mg via ORAL
  Filled 2019-01-28 (×14): qty 1

## 2019-01-28 MED ORDER — VITAMIN D 25 MCG (1000 UNIT) PO TABS
1000.0000 [IU] | ORAL_TABLET | Freq: Every day | ORAL | Status: DC
  Administered 2019-01-28 – 2019-02-10 (×14): 1000 [IU] via ORAL
  Filled 2019-01-28 (×14): qty 1

## 2019-01-28 MED ORDER — IPRATROPIUM BROMIDE HFA 17 MCG/ACT IN AERS
2.0000 | INHALATION_SPRAY | Freq: Three times a day (TID) | RESPIRATORY_TRACT | Status: DC
  Administered 2019-01-28 – 2019-02-09 (×34): 2 via RESPIRATORY_TRACT
  Filled 2019-01-28: qty 12.9

## 2019-01-28 MED ORDER — FENTANYL 50 MCG/HR TD PT72
1.0000 | MEDICATED_PATCH | TRANSDERMAL | Status: DC
  Administered 2019-01-28: 1 via TRANSDERMAL
  Filled 2019-01-28: qty 1

## 2019-01-28 MED ORDER — INSULIN ASPART 100 UNIT/ML ~~LOC~~ SOLN
0.0000 [IU] | Freq: Three times a day (TID) | SUBCUTANEOUS | Status: DC
  Administered 2019-01-28 – 2019-01-29 (×3): 1 [IU] via SUBCUTANEOUS
  Administered 2019-01-29 – 2019-01-30 (×2): 2 [IU] via SUBCUTANEOUS
  Administered 2019-01-31 – 2019-02-01 (×4): 1 [IU] via SUBCUTANEOUS
  Administered 2019-02-02: 3 [IU] via SUBCUTANEOUS
  Administered 2019-02-02: 2 [IU] via SUBCUTANEOUS
  Administered 2019-02-03: 08:00:00 5 [IU] via SUBCUTANEOUS
  Administered 2019-02-04: 3 [IU] via SUBCUTANEOUS
  Administered 2019-02-04 – 2019-02-05 (×2): 2 [IU] via SUBCUTANEOUS
  Administered 2019-02-05: 3 [IU] via SUBCUTANEOUS
  Administered 2019-02-05: 2 [IU] via SUBCUTANEOUS
  Administered 2019-02-06: 5 [IU] via SUBCUTANEOUS
  Administered 2019-02-06: 2 [IU] via SUBCUTANEOUS
  Administered 2019-02-06: 1 [IU] via SUBCUTANEOUS
  Administered 2019-02-07: 2 [IU] via SUBCUTANEOUS
  Administered 2019-02-07: 13:00:00 7 [IU] via SUBCUTANEOUS
  Administered 2019-02-08 (×2): 2 [IU] via SUBCUTANEOUS
  Administered 2019-02-08 – 2019-02-09 (×3): 3 [IU] via SUBCUTANEOUS
  Administered 2019-02-09 – 2019-02-10 (×2): 2 [IU] via SUBCUTANEOUS

## 2019-01-28 MED ORDER — ONDANSETRON HCL 4 MG/2ML IJ SOLN: 4.0000 mg | Freq: Four times a day (QID) | INTRAMUSCULAR | Status: DC | PRN

## 2019-01-28 MED ORDER — INSULIN ASPART 100 UNIT/ML ~~LOC~~ SOLN
0.0000 [IU] | Freq: Every day | SUBCUTANEOUS | Status: DC
  Administered 2019-02-03: 3 [IU] via SUBCUTANEOUS
  Administered 2019-02-04: 2 [IU] via SUBCUTANEOUS
  Administered 2019-02-05 – 2019-02-08 (×3): 3 [IU] via SUBCUTANEOUS
  Administered 2019-02-09: 4 [IU] via SUBCUTANEOUS

## 2019-01-28 MED ORDER — CIPROFLOXACIN IN D5W 400 MG/200ML IV SOLN
400.0000 mg | Freq: Two times a day (BID) | INTRAVENOUS | Status: DC
  Administered 2019-01-28: 400 mg via INTRAVENOUS
  Filled 2019-01-28: qty 200

## 2019-01-28 MED ORDER — SODIUM CHLORIDE 0.9 % IV SOLN
1.0000 g | INTRAVENOUS | Status: DC
  Administered 2019-01-28: 1 g via INTRAVENOUS
  Filled 2019-01-28: qty 10

## 2019-01-28 MED ORDER — ZINC SULFATE 220 (50 ZN) MG PO CAPS
220.0000 mg | ORAL_CAPSULE | Freq: Every day | ORAL | Status: DC
  Administered 2019-01-28 – 2019-02-10 (×14): 220 mg via ORAL
  Filled 2019-01-28 (×14): qty 1

## 2019-01-28 NOTE — ED Notes (Signed)
Tele-psych eval in progress.

## 2019-01-28 NOTE — Progress Notes (Addendum)
PROGRESS NOTE  Kristen Colon [id number] DOB: 06/09/1950 DOA: 01/27/2019 PCP: Everardo Beals, NP  HPI/Recap of past 24 hours: Kristen Colon  is a 69 y.o. female with past medical history of hypertension, type 2 diabetes, schizophrenia, chronic anxiety, breast cancer on letrozole, dementia who presented from ALF with complaints of dyspnea.  Was sent to the ED for further evaluation.  She had a recent positive COVID-19 test at ALF.  On presentation to the ED she is febrile, tachycardic and hypoxic.  COVID-19 screening test positive on 01/27/2019.  CTA chest showed no evidence of pulmonary embolism however showed bilateral pulmonary patchy opacities suggestive of COVID-19 viral pneumonia.  Was started on COVID-19 directed therapies.  Negative procalcitonin.  01/28/19: Seen and examined.  Minimally interactive and answers simple questions.  Denies any chest pain, dyspnea at rest, abdominal pain or nausea.  She is alert and oriented x1.  On 3 L with O2 saturation 95%.    Assessment/Plan: Active Problems:   Hypertension, uncontrolled   Diabetes mellitus type 2 in nonobese (HCC)   Acute respiratory failure with hypoxia (HCC)   COVID-19 virus infection  Acute COVID-19 viral pneumonia Per report patient previously tested positive for COVID-19 at ALF In house positive COVID-19 test on 01/27/2019. Negative procalcitonin; blood cultures 01/27/2019 no growth less than 24 hours. DC empiric IV antibiotics Started on remdesivir daily x5 days and IV Decadron 6 mg daily x10 days, day #2 Start bronchodilators, Xopenex inhaler 2 puffs 3 times daily and ipratropium inhaler 2 puffs 3 times daily. Start vitamin D3, vitamin C, zinc Start incentive spirometer and flutter valve. Maintain oxygen saturation greater than 92% Inflammatory markers are elevated, continue to trend  Acute hypoxic respiratory failure secondary to acute COVID-19 viral pneumonia Not on oxygen supplementation at baseline Currently  requiring 3 L oxygen supplementation nasal cannula to maintain O2 saturation greater than 92% Rest of management per above. Home O2 evaluation when close to discharge  Mild hypovolemic hyponatremia Presented with sodium level of 133 Hypovolemic on exam Has received IV fluid in the ED IV fluid has been discontinued Repeat BMP in the morning  History of breast cancer on letrozole/history of ovarian cancer on femara Continue home medication Will need to follow-up with oncology outpatient  Moderate protein calorie malnutrition Albumin 2.9 Encourage increase oral protein calorie intake  Chronic anxiety/schizophrenia Continue home medications  Dementia Continue home medication Reorient as needed  Physical debility PT to assess once stable Fall precautions   Type 2 diabetes with hyperglycemia Hold off oral antiglycemics Continue insulin sliding scale -A1c  IBS/constipation Continue home medications   Code Status: Full code  Family Communication: None at bedside  Disposition Plan: Transfer to [facility] to continue care.   Consultants:  None  Procedures:  None  Antimicrobials:  None  DVT prophylaxis: Subcu Lovenox daily   Objective: Vitals:   01/28/19 0900 01/28/19 0930 01/28/19 1000 01/28/19 1017  BP: 93/64 111/61 117/66 117/66  Pulse: 87 85 84   Resp: 12 12 13    Temp:      TempSrc:      SpO2: 92% 96% 95%   Height:        Intake/Output Summary (Last 24 hours) at 01/28/2019 1038 Last data filed at 01/28/2019 0856 Gross per 24 hour  Intake 250 ml  Output 2050 ml  Net -1800 ml   There were no vitals filed for this visit.  Exam:  . General: 70 y.o. year-old female well developed well nourished in no acute distress.  Alert and oriented x1. . Cardiovascular: Regular rate and rhythm with no rubs or gallops.  No thyromegaly or JVD noted.   Marland Kitchen Respiratory: Mild rales at bases no wheezing noted.  Poor inspiratory effort.  . Abdomen: Soft nontender  nondistended with normal bowel sounds x4 quadrants. . Musculoskeletal: No lower extremity edema. 2/4 pulses in all 4 extremities. Marland Kitchen Psychiatry: Mood is appropriate for condition and setting   Data Reviewed: CBC: Recent Labs  Lab 01/27/19 1730 01/28/19 0542  WBC 6.2 6.8  NEUTROABS 4.5 5.5  HGB 12.3 11.9*  HCT 37.7 37.2  MCV 88.1 89.0  PLT 233 [id number]   Basic Metabolic Panel: Recent Labs  Lab 01/27/19 1730 01/28/19 0542  NA 133* 133*  K 4.0 4.1  CL 99 101  CO2 24 22  GLUCOSE 139* 181*  BUN 12 11  CREATININE 0.77 0.71  CALCIUM 8.5* 8.3*   GFR: CrCl cannot be calculated (Unknown ideal weight.). Liver Function Tests: Recent Labs  Lab 01/27/19 1730 01/28/19 0542  AST 26 24  ALT 18 19  ALKPHOS 64 67  BILITOT 0.3 0.2*  PROT 6.9 6.6  ALBUMIN 3.4* 2.9*   No results for input(s): LIPASE, AMYLASE in the last 168 hours. No results for input(s): AMMONIA in the last 168 hours. Coagulation Profile: No results for input(s): INR, PROTIME in the last 168 hours. Cardiac Enzymes: No results for input(s): CKTOTAL, CKMB, CKMBINDEX, TROPONINI in the last 168 hours. BNP (last 3 results) No results for input(s): PROBNP in the last 8760 hours. HbA1C: No results for input(s): HGBA1C in the last 72 hours. CBG: Recent Labs  Lab 01/28/19 0124 01/28/19 0820  GLUCAP 186* 123*   Lipid Profile: Recent Labs    01/27/19 1730  TRIG 95   Thyroid Function Tests: No results for input(s): TSH, T4TOTAL, FREET4, T3FREE, THYROIDAB in the last 72 hours. Anemia Panel: Recent Labs    01/27/19 1730  FERRITIN 261   Urine analysis:    Component Value Date/Time   COLORURINE YELLOW 01/27/2019 1804   APPEARANCEUR HAZY (A) 01/27/2019 1804   LABSPEC 1.020 01/27/2019 1804   PHURINE 5.0 01/27/2019 1804   GLUCOSEU NEGATIVE 01/27/2019 1804   HGBUR NEGATIVE 01/27/2019 1804   BILIRUBINUR NEGATIVE 01/27/2019 1804   KETONESUR NEGATIVE 01/27/2019 1804   PROTEINUR 100 (A) 01/27/2019 1804    UROBILINOGEN 0.2 10/23/2014 1102   NITRITE NEGATIVE 01/27/2019 1804   LEUKOCYTESUR NEGATIVE 01/27/2019 1804   Sepsis Labs: @LABRCNTIP (procalcitonin:4,lacticidven:4)  ) Recent Results (from the past 240 hour(s))  Blood Culture (routine x 2)     Status: None (Preliminary result)   Collection Time: 01/27/19  5:11 PM   Specimen: BLOOD  Result Value Ref Range Status   Specimen Description BLOOD RIGHT ANTECUBITAL  Final   Special Requests   Final    BOTTLES DRAWN AEROBIC AND ANAEROBIC Blood Culture adequate volume   Culture   Final    NO GROWTH < 24 HOURS Performed at [facility], [place] [address], [place], [place] [address]    Report Status PENDING  Incomplete  SARS CORONAVIRUS 2 (TAT 6-24 HRS) Nasopharyngeal Nasopharyngeal Swab     Status: Abnormal   Collection Time: 01/27/19  5:19 PM   Specimen: Nasopharyngeal Swab  Result Value Ref Range Status   SARS Coronavirus 2 POSITIVE (A) NEGATIVE Final    Comment: RESULT CALLED TO, READ BACK BY AND VERIFIED WITH: Neldon Mc 2203 01/27/2019 D BRADLEY (NOTE) SARS-CoV-2 target nucleic acids are DETECTED. The SARS-CoV-2 RNA is generally  detectable in upper and lower respiratory specimens during the acute phase of infection. Positive results are indicative of the presence of SARS-CoV-2 RNA. Clinical correlation with patient history and other diagnostic information is  necessary to determine patient infection status. Positive results do not rule out bacterial infection or co-infection with other viruses.  The expected result is Negative. Fact Sheet for Patients: SugarRoll.be Fact Sheet for Healthcare Providers: https://www.woods-mathews.com/ This test is not yet approved or cleared by the [place] FDA and  has been authorized for detection and/or diagnosis of SARS-CoV-2 by FDA under an Emergency Use Authorization (EUA). This EUA will remain  in effect (meaning this test can be used) for t  he duration of the COVID-19 declaration under Section 564(b)(1) of the Act, 21 U.S.C. section 360bbb-3(b)(1), unless the authorization is terminated or revoked sooner. Performed at [facility], [place] [address], [place], [place] [address]       Studies: CT ANGIO CHEST PE W OR WO CONTRAST  Result Date: 01/27/2019 CLINICAL DATA:  Tachycardia and elevated D-dimer. EXAM: CT ANGIOGRAPHY CHEST WITH CONTRAST TECHNIQUE: Multidetector CT imaging of the chest was performed using the standard protocol during bolus administration of intravenous contrast. Multiplanar CT image reconstructions and MIPs were obtained to evaluate the vascular anatomy. CONTRAST:  13mL OMNIPAQUE IOHEXOL 350 MG/ML SOLN COMPARISON:  September 29, 2018 FINDINGS: Cardiovascular: Satisfactory opacification of the pulmonary arteries to the segmental level. No evidence of pulmonary embolism. Normal heart size. No pericardial effusion. Mediastinum/Nodes: No enlarged mediastinal, hilar, or axillary lymph nodes. Thyroid gland, trachea, and esophagus demonstrate no significant findings. Lungs/Pleura: Mild emphysematous lung disease is seen involving the posterior aspect of the bilateral upper lobes. Mild atelectatic changes are also seen within the posterior aspect of the bilateral upper lobes, anteromedial aspect of the right upper lobe and posterior aspect of the bilateral lung bases. There is no evidence of a pleural effusion or pneumothorax. Upper Abdomen: No acute abnormality. Musculoskeletal: No chest wall abnormality. No acute or significant osseous findings. Review of the MIP images confirms the above findings. IMPRESSION: 1. No evidence of pulmonary embolus. 2. Mild bilateral upper lobe and bibasilar atelectasis. Emphysema (ICD10-J43.9). Electronically Signed   By: Virgina Norfolk M.D.   On: 01/27/2019 20:49   DG Chest Port 1 View  Result Date: 01/27/2019 CLINICAL DATA:  Fever and shortness of breath, possible COVID-19 infection  EXAM: PORTABLE CHEST 1 VIEW COMPARISON:  07/29/2017 FINDINGS: Cardiac shadows within normal limits. The lungs are well aerated bilaterally. No focal infiltrate or sizable effusion is seen. No bony abnormality is noted. IMPRESSION: No active disease. Electronically Signed   By: Inez Catalina M.D.   On: 01/27/2019 17:27    Scheduled Meds: . amLODipine  5 mg Oral Daily  . vitamin C  500 mg Oral Daily  . buPROPion  150 mg Oral Daily  . cholecalciferol  1,000 Units Oral Daily  . cyclobenzaprine  5 mg Oral Daily  . dexamethasone (DECADRON) injection  6 mg Intravenous Q24H  . docusate sodium  100 mg Oral BID  . enoxaparin (LOVENOX) injection  40 mg Subcutaneous Q24H  . insulin aspart  0-5 Units Subcutaneous QHS  . insulin aspart  0-9 Units Subcutaneous TID WC  . ipratropium  2 puff Inhalation Q8H  . lactulose  10 g Oral Daily  . letrozole  2.5 mg Oral Daily  . levalbuterol  2 puff Inhalation Q8H  . lisinopril  10 mg Oral Daily  . lubiprostone  24 mcg Oral Daily  .  memantine  5 mg Oral BID  . metFORMIN  500 mg Oral Q breakfast  . pantoprazole  40 mg Oral Daily  . temazepam  15 mg Oral QHS  . traZODone  100 mg Oral QHS  . zinc sulfate  220 mg Oral Daily    Continuous Infusions: . cefTRIAXone (ROCEPHIN)  IV 1 g (01/28/19 1015)  . remdesivir 100 mg in NS 100 mL       LOS: 1 day     Kayleen Memos, MD [facility] Pager [phone]  If 7PM-7AM, please contact night-coverage [email or web address] Password [facility] [place] 01/28/2019, 10:38 AM

## 2019-01-28 NOTE — Progress Notes (Signed)
[place] @ 1300  Kristen Colon is under hospice services for a terminal diagnosis of ovarian cancer per Dr. Karie Georges with [facility].  Pt had tested positive for COVID and per her daughter began running a fever and was SOB.  Hospice RN made home visit and daughter made decision to send her mother to the ED for further evaluation.  This is a related hospital admission.  Pt transferred to [facility] hospital from [facility] and arrive around noon.  Pt is calm and cooperative and not in any current distress. Spoke with daughter [name], she is worried about not being able to see her mother and concerned about her mental state.  Bedside RN to call and update [name] when she has the opportunity to.  V/S:  97.6 ax, 98/52, HR 88, RR 16, SPO2 94% 3 lpm via [place] I&O:  250/800 Lab work:  SARS coronavirus 2 +, Na 133, Al 2.9 Diagnostics:  CT angio- Mild bilateral upper lobe and bibasilar atelectasis IVs/PRNs:  NS @ 50ml/hr, rocephin 1 g IV x 1, cipro 400 mg IV BID, remdesivir 200 mg IV, decadron 6 mg IV QD  Problem List COVID pneumonia- Remdesevir; decadron 6 mg IV QD, O2 at 3 lpm, scheduled bronchodilator Altered mental status- appears to be at baseline, she is intermittently confused to date and time  D/C planning:  Return home with daughter [name] once able to; [place] and her son are currently being tested for COVID GOC:  Unclear, she remains a full code IDT:  Updated Family:  Updated  Transfer summary and med list sent to unit to be placed on shadow chart  Venia Carbon RN, BSN, [place] (in [place]) [phone]

## 2019-01-28 NOTE — Consult Note (Signed)
Telepsych Consultation   Reason for Consult:  Schizophrenia Referring Physician:  Dr Nevada Crane Location of Patient: [place] [facility] Location of Provider: [facility]  Patient Identification: MARIDEL PIXLER MRN:  [id number] Principal Diagnosis: <principal problem not specified> Diagnosis:  Active Problems:   Hypertension, uncontrolled   Diabetes mellitus type 2 in nonobese ([place])   Acute respiratory failure with hypoxia ([place])   COVID-19 virus infection   Total Time spent with patient: 30 minutes  Subjective:   CHENELLE BENNING is a 69 y.o. female patient admitted for evaluation of recently diagnosed Covid.  Patient alert, oriented to self and situation.  Patient denies suicidal ideations.  Patient denies history of self-harm.  Patient denies homicidal ideations.  Patient denies access to weapons.  Patient denies symptoms of paranoia.  Patient denies hallucinations. Patient reports she lives with her daughter, [name]. Patient gives verbal consent to speak with [facility] regarding care. Spoke with patient's daughter, Larkin Ina: [name] reports "the Seroquel is great for her she does not give any trouble."  [name] reports patient's mood has improved, patient's behavior more manageable at home.  [name] denies any concerns for patient safety at home.  Per [facility] outpatient psychiatrist is Dr. Nyoka Cowden in [place].  Patient receives [place] monthly as well as Seroquel as needed, this has improved patient's behavior per daughter.  Per [name] patient was not herself at home for couple of days related to coronavirus but patient was "perfectly fine until she got the coronavirus." Patient seen along with Dr. Dwyane Dee.  Cleared by psychiatry.  HPI: Patient admitted for evaluation of recently diagnosed Covid.  Patient has history of schizophrenia.  Past Psychiatric History: Schizophrenia  Risk to Self:  Denies Risk to Others:  Denies Prior Inpatient Therapy:  Yes Prior Outpatient Therapy:   Yes  Past Medical History:  Past Medical History:  Diagnosis Date  . Anxiety   . Dementia ([place])   . Dyspnea   . Headache   . Hypertension   . Schizophrenia ([place])    paranoid    Past Surgical History:  Procedure Laterality Date  . DIAGNOSTIC LAPAROSCOPY     pelvic mass Dr. Denman George 06-30-17  . LAPAROSCOPY N/A 06/30/2017   Procedure: LAPAROSCOPY DIAGNOSTIC WITH BIOPSIES;  Surgeon: Everitt Amber, MD;  Location: [facility];  Service: Gynecology;  Laterality: N/A;   Family History:  Family History  Problem Relation Age of Onset  . Heart disease Mother   . Diabetes Sister    Family Psychiatric  History: Unknown Social History:  Social History   Substance and Sexual Activity  Alcohol Use Not Currently     Social History   Substance and Sexual Activity  Drug Use Not Currently  . Types: Marijuana    Social History   Socioeconomic History  . Marital status: Divorced    Spouse name: Not on file  . Number of children: Not on file  . Years of education: Not on file  . Highest education level: Not on file  Occupational History  . Not on file  Tobacco Use  . Smoking status: Current Every Day Smoker    Packs/day: 1.00    Years: 5.00    Pack years: 5.00    Types: Cigarettes  . Smokeless tobacco: Never Used  Substance and Sexual Activity  . Alcohol use: Not Currently  . Drug use: Not Currently    Types: Marijuana  . Sexual activity: Not Currently    Comment: quit 5 years ago  Other Topics Concern  .  Not on file  Social History Narrative  . Not on file   Social Determinants of Health   Financial Resource Strain:   . Difficulty of Paying Living Expenses: Not on file  Food Insecurity:   . Worried About Charity fundraiser in the Last Year: Not on file  . Ran Out of Food in the Last Year: Not on file  Transportation Needs:   . Lack of Transportation (Medical): Not on file  . Lack of Transportation (Non-Medical): Not on file  Physical Activity:   . Days of Exercise per  Week: Not on file  . Minutes of Exercise per Session: Not on file  Stress:   . Feeling of Stress : Not on file  Social Connections:   . Frequency of Communication with Friends and Family: Not on file  . Frequency of Social Gatherings with Friends and Family: Not on file  . Attends Religious Services: Not on file  . Active Member of Clubs or Organizations: Not on file  . Attends Archivist Meetings: Not on file  . Marital Status: Not on file   Additional Social History:    Allergies:   Allergies  Allergen Reactions  . Penicillins Other (See Comments)    Edema Did it involve swelling of the face/tongue/throat, SOB, or low BP? Unknown Did it involve sudden or severe rash/hives, skin peeling, or any reaction on the inside of your mouth or nose? Unknown Did you need to seek medical attention at a hospital or doctor's office? Unknown When did it last happen?unknown If all above answers are "NO", may proceed with cephalosporin use. Daughter was unaware of allergy  . Percocet [Oxycodone-Acetaminophen] Itching    Labs:  Results for orders placed or performed during the hospital encounter of 01/27/19 (from the past 48 hour(s))  Blood Culture (routine x 2)     Status: None (Preliminary result)   Collection Time: 01/27/19  5:11 PM   Specimen: BLOOD  Result Value Ref Range   Specimen Description BLOOD RIGHT ANTECUBITAL    Special Requests      BOTTLES DRAWN AEROBIC AND ANAEROBIC Blood Culture adequate volume   Culture      NO GROWTH < 24 HOURS Performed at [facility] [address], [place], [place] [address]    Report Status PENDING   SARS CORONAVIRUS 2 (TAT 6-24 HRS) Nasopharyngeal Nasopharyngeal Swab     Status: Abnormal   Collection Time: 01/27/19  5:19 PM   Specimen: Nasopharyngeal Swab  Result Value Ref Range   SARS Coronavirus 2 POSITIVE (A) NEGATIVE    Comment: RESULT CALLED TO, READ BACK BY AND VERIFIED WITH: Neldon Mc 2203 01/27/2019 D  BRADLEY (NOTE) SARS-CoV-2 target nucleic acids are DETECTED. The SARS-CoV-2 RNA is generally detectable in upper and lower respiratory specimens during the acute phase of infection. Positive results are indicative of the presence of SARS-CoV-2 RNA. Clinical correlation with patient history and other diagnostic information is  necessary to determine patient infection status. Positive results do not rule out bacterial infection or co-infection with other viruses.  The expected result is Negative. Fact Sheet for Patients: SugarRoll.be Fact Sheet for Healthcare Providers: https://www.woods-mathews.com/ This test is not yet approved or cleared by the [place] FDA and  has been authorized for detection and/or diagnosis of SARS-CoV-2 by FDA under an Emergency Use Authorization (EUA). This EUA will remain  in effect (meaning this test can be used) for t he duration of the COVID-19 declaration under Section 564(b)(1)  of the Act, 21 U.S.C. section 360bbb-3(b)(1), unless the authorization is terminated or revoked sooner. Performed at [facility], [place] [address] [place], [place] [address]   Lactic acid, plasma     Status: None   Collection Time: 01/27/19  5:30 PM  Result Value Ref Range   Lactic Acid, Venous 1.4 0.5 - 1.9 mmol/L    Comment: Performed at [facility] [address], [place], [place] [address]  CBC WITH DIFFERENTIAL     Status: Abnormal   Collection Time: 01/27/19  5:30 PM  Result Value Ref Range   WBC 6.2 4.0 - 10.5 K/uL   RBC 4.28 3.87 - 5.11 MIL/uL   Hemoglobin 12.3 12.0 - 15.0 g/dL   HCT 37.7 36.0 - 46.0 %   MCV 88.1 80.0 - 100.0 fL   MCH 28.7 26.0 - 34.0 pg   MCHC 32.6 30.0 - 36.0 g/dL   RDW 17.1 (H) 11.5 - 15.5 %   Platelets 233 150 - 400 K/uL   nRBC 0.0 0.0 - 0.2 %   Neutrophils Relative % 73 %   Neutro Abs 4.5 1.7 - 7.7 K/uL   Lymphocytes Relative 20 %   Lymphs Abs 1.3 0.7 - 4.0 K/uL   Monocytes  Relative 6 %   Monocytes Absolute 0.4 0.1 - 1.0 K/uL   Eosinophils Relative 0 %   Eosinophils Absolute 0.0 0.0 - 0.5 K/uL   Basophils Relative 0 %   Basophils Absolute 0.0 0.0 - 0.1 K/uL   Immature Granulocytes 1 %   Abs Immature Granulocytes 0.03 0.00 - 0.07 K/uL    Comment: Performed at [facility] [address] [place], [place] [address]  Comprehensive metabolic panel     Status: Abnormal   Collection Time: 01/27/19  5:30 PM  Result Value Ref Range   Sodium 133 (L) 135 - 145 mmol/L   Potassium 4.0 3.5 - 5.1 mmol/L   Chloride 99 98 - 111 mmol/L   CO2 24 22 - 32 mmol/L   Glucose, Bld 139 (H) 70 - 99 mg/dL   BUN 12 8 - 23 mg/dL   Creatinine, Ser 0.77 0.44 - 1.00 mg/dL   Calcium 8.5 (L) 8.9 - 10.3 mg/dL   Total Protein 6.9 6.5 - 8.1 g/dL   Albumin 3.4 (L) 3.5 - 5.0 g/dL   AST 26 15 - 41 U/L   ALT 18 0 - 44 U/L   Alkaline Phosphatase 64 38 - 126 U/L   Total Bilirubin 0.3 0.3 - 1.2 mg/dL   GFR calc non Af Amer >60 >60 mL/min   GFR calc Af Amer >60 >60 mL/min   Anion gap 10 5 - 15    Comment: Performed at [facility] [address] [place], [place] [address]  D-dimer, quantitative     Status: Abnormal   Collection Time: 01/27/19  5:30 PM  Result Value Ref Range   D-Dimer, Quant 2.66 (H) 0.00 - 0.50 ug/mL-FEU    Comment: (NOTE) At the manufacturer cut-off of 0.50 ug/mL FEU, this assay has been documented to exclude PE with a sensitivity and negative predictive value of 97 to 99%.  At this time, this assay has not been approved by the FDA to exclude DVT/VTE. Results should be correlated with clinical presentation. Performed at [facility], [place] [address], [place], [place] [address]   Procalcitonin     Status: None   Collection Time: 01/27/19  5:30 PM  Result  Value Ref Range   Procalcitonin <0.10 ng/mL    Comment:        Interpretation: PCT (Procalcitonin) <= 0.5 ng/mL: Systemic infection (sepsis) is not likely. Local bacterial infection is  possible. (NOTE)       Sepsis PCT Algorithm           Lower Respiratory Tract                                      Infection PCT Algorithm    ----------------------------     ----------------------------         PCT < 0.25 ng/mL                PCT < 0.10 ng/mL         Strongly encourage             Strongly discourage   discontinuation of antibiotics    initiation of antibiotics    ----------------------------     -----------------------------       PCT 0.25 - 0.50 ng/mL            PCT 0.10 - 0.25 ng/mL               OR       >80% decrease in PCT            Discourage initiation of                                            antibiotics      Encourage discontinuation           of antibiotics    ----------------------------     -----------------------------         PCT >= 0.50 ng/mL              PCT 0.26 - 0.50 ng/mL               AND        <80% decrease in PCT             Encourage initiation of                                             antibiotics       Encourage continuation           of antibiotics    ----------------------------     -----------------------------        PCT >= 0.50 ng/mL                  PCT > 0.50 ng/mL               AND         increase in PCT                  Strongly encourage                                      initiation of antibiotics    Strongly encourage escalation  of antibiotics                                     -----------------------------                                           PCT <= 0.25 ng/mL                                                 OR                                        > 80% decrease in PCT                                     Discontinue / Do not initiate                                             antibiotics Performed at [facility], [place] [address], [place], [place] [address]   Lactate dehydrogenase     Status: Abnormal   Collection Time: 01/27/19  5:30 PM  Result Value Ref Range   LDH 207 (H) 98 - 192  U/L    Comment: Performed at [facility] [address] [place], [place] [address]  Ferritin     Status: None   Collection Time: 01/27/19  5:30 PM  Result Value Ref Range   Ferritin 261 11 - 307 ng/mL    Comment: Performed at [facility] [address], [place], [place] [address]  Fibrinogen     Status: Abnormal   Collection Time: 01/27/19  5:30 PM  Result Value Ref Range   Fibrinogen 489 (H) 210 - 475 mg/dL    Comment: Performed at [facility] [address] [place], [place] [address]  C-reactive protein     Status: Abnormal   Collection Time: 01/27/19  5:30 PM  Result Value Ref Range   CRP 6.2 (H) <1.0 mg/dL    Comment: Performed at [facility] [address], [place], [place] [address]  Triglycerides     Status: None   Collection Time: 01/27/19  5:30 PM  Result Value Ref Range   Triglycerides 95 <150 mg/dL    Comment: Performed at [facility] [address] [place], [place] [address]  POC SARS Coronavirus 2 Ag-ED - Nasal Swab (BD Veritor Kit)     Status: None   Collection Time: 01/27/19  5:58 PM  Result Value Ref Range   SARS Coronavirus 2 Ag NEGATIVE NEGATIVE    Comment: (NOTE) SARS-CoV-2 antigen NOT DETECTED.  Negative results are presumptive.  Negative results do not preclude SARS-CoV-2 infection and should not be used as the sole basis for treatment or other patient management decisions, including infection  control decisions, particularly in the presence of clinical signs and  symptoms consistent with COVID-19, or in those who have been in contact with the virus.  Negative results must be combined with clinical observations, patient history, and epidemiological information. The expected result is Negative. Fact Sheet for Patients: PodPark.tn Fact Sheet for Healthcare Providers: GiftContent.is This test is not yet approved or cleared by the [place] FDA and  has  been authorized for detection and/or diagnosis of SARS-CoV-2 by FDA under an Emergency Use Authorization (EUA).  This EUA will remain in effect (meaning this test can be used) for the duration of  the COVID-19 de claration under Section 564(b)(1) of the Act, 21 U.S.C. section 360bbb-3(b)(1), unless the authorization is terminated or revoked sooner.   Urinalysis, Routine w reflex microscopic     Status: Abnormal   Collection Time: 01/27/19  6:04 PM  Result Value Ref Range   Color, Urine YELLOW YELLOW   APPearance HAZY (A) CLEAR   Specific Gravity, Urine 1.020 1.005 - 1.030   pH 5.0 5.0 - 8.0   Glucose, UA NEGATIVE NEGATIVE mg/dL   Hgb urine dipstick NEGATIVE NEGATIVE   Bilirubin Urine NEGATIVE NEGATIVE   Ketones, ur NEGATIVE NEGATIVE mg/dL   Protein, ur 100 (A) NEGATIVE mg/dL   Nitrite NEGATIVE NEGATIVE   Leukocytes,Ua NEGATIVE NEGATIVE   RBC / HPF 0-5 0 - 5 RBC/hpf   WBC, UA 0-5 0 - 5 WBC/hpf   Bacteria, UA RARE (A) NONE SEEN   Squamous Epithelial / LPF 0-5 0 - 5    Comment: Performed at [facility], [address] [place], [place] [address]  Troponin I (High Sensitivity)     Status: None   Collection Time: 01/27/19  9:28 PM  Result Value Ref Range   Troponin I (High Sensitivity) 5 <18 ng/L    Comment: (NOTE) Elevated high sensitivity troponin I (hsTnI) values and significant  changes across serial measurements may suggest ACS but many other  chronic and acute conditions are known to elevate hsTnI results.  Refer to the "Links" section for chest pain algorithms and additional  guidance. Performed at [facility], [place] [address], [place], [place] [address]   CBG monitoring, ED     Status: Abnormal   Collection Time: 01/28/19  1:24 AM  Result Value Ref Range   Glucose-Capillary 186 (H) 70 - 99 mg/dL   Comment 1 Notify RN    Comment 2 Document in Chart   CBC with Differential/Platelet     Status: Abnormal   Collection Time: 01/28/19  5:42 AM  Result Value Ref  Range   WBC 6.8 4.0 - 10.5 K/uL   RBC 4.18 3.87 - 5.11 MIL/uL   Hemoglobin 11.9 (L) 12.0 - 15.0 g/dL   HCT 37.2 36.0 - 46.0 %   MCV 89.0 80.0 - 100.0 fL   MCH 28.5 26.0 - 34.0 pg   MCHC 32.0 30.0 - 36.0 g/dL   RDW 16.9 (H) 11.5 - 15.5 %   Platelets 242 150 - 400 K/uL   nRBC 0.0 0.0 - 0.2 %   Neutrophils Relative % 82 %   Neutro Abs 5.5 1.7 - 7.7 K/uL   Lymphocytes Relative 15 %   Lymphs Abs 1.0 0.7 - 4.0 K/uL   Monocytes Relative 3 %   Monocytes Absolute 0.2 0.1 - 1.0 K/uL   Eosinophils Relative 0 %   Eosinophils Absolute 0.0 0.0 - 0.5 K/uL   Basophils Relative 0 %   Basophils Absolute 0.0 0.0 - 0.1 K/uL   Immature  Granulocytes 0 %   Abs Immature Granulocytes 0.02 0.00 - 0.07 K/uL    Comment: Performed at [facility], [place] [address] [place], [place] [address]  Comprehensive metabolic panel     Status: Abnormal   Collection Time: 01/28/19  5:42 AM  Result Value Ref Range   Sodium 133 (L) 135 - 145 mmol/L   Potassium 4.1 3.5 - 5.1 mmol/L   Chloride 101 98 - 111 mmol/L   CO2 22 22 - 32 mmol/L   Glucose, Bld 181 (H) 70 - 99 mg/dL   BUN 11 8 - 23 mg/dL   Creatinine, Ser 0.71 0.44 - 1.00 mg/dL   Calcium 8.3 (L) 8.9 - 10.3 mg/dL   Total Protein 6.6 6.5 - 8.1 g/dL   Albumin 2.9 (L) 3.5 - 5.0 g/dL   AST 24 15 - 41 U/L   ALT 19 0 - 44 U/L   Alkaline Phosphatase 67 38 - 126 U/L   Total Bilirubin 0.2 (L) 0.3 - 1.2 mg/dL   GFR calc non Af Amer >60 >60 mL/min   GFR calc Af Amer >60 >60 mL/min   Anion gap 10 5 - 15    Comment: Performed at [facility], [place] [address], [place], [place] [address]  Troponin I (High Sensitivity)     Status: None   Collection Time: 01/28/19  5:42 AM  Result Value Ref Range   Troponin I (High Sensitivity) 4 <18 ng/L    Comment: (NOTE) Elevated high sensitivity troponin I (hsTnI) values and significant  changes across serial measurements may suggest ACS but many other  chronic and acute conditions are known to elevate hsTnI results.   Refer to the "Links" section for chest pain algorithms and additional  guidance. Performed at [facility], [place] [address] [place], [place] [address]   CBG monitoring, ED     Status: Abnormal   Collection Time: 01/28/19  8:20 AM  Result Value Ref Range   Glucose-Capillary 123 (H) 70 - 99 mg/dL    Medications:  Current Facility-Administered Medications  Medication Dose Route Frequency Provider Last Rate Last Admin  . amLODipine (NORVASC) tablet 5 mg  5 mg Oral Daily Jani Gravel, MD   5 mg at 01/28/19 1018  . ascorbic acid (VITAMIN C) tablet 500 mg  500 mg Oral Daily Irene Pap N, DO      . buPROPion (WELLBUTRIN XL) 24 hr tablet 150 mg  150 mg Oral Daily Jani Gravel, MD   150 mg at 01/28/19 1017  . cefTRIAXone (ROCEPHIN) 1 g in sodium chloride 0.9 % 100 mL IVPB  1 g Intravenous Q24H Hall, Carole N, DO 200 mL/hr at 01/28/19 1015 1 g at 01/28/19 1015  . cholecalciferol (VITAMIN D3) tablet 1,000 Units  1,000 Units Oral Daily Hall, Carole N, DO      . cyclobenzaprine (FLEXERIL) tablet 5 mg  5 mg Oral Daily Jani Gravel, MD   5 mg at 01/28/19 1016  . dexamethasone (DECADRON) injection 6 mg  6 mg Intravenous Q24H Jani Gravel, MD   6 mg at 01/27/19 2017  . diazepam (VALIUM) tablet 5 mg  5 mg Oral Q8H PRN Jani Gravel, MD      . docusate sodium (COLACE) capsule 100 mg  100 mg Oral BID Jani Gravel, MD   100 mg at 01/28/19 1017  . enoxaparin (LOVENOX) injection 40 mg  40 mg Subcutaneous Q24H Jani Gravel, MD   40 mg at 01/27/19 2014  .  HYDROcodone-acetaminophen (NORCO/VICODIN) 5-325 MG per tablet 1 tablet  1 tablet Oral Q4H PRN Jani Gravel, MD      . insulin aspart (novoLOG) injection 0-5 Units  0-5 Units Subcutaneous QHS Jani Gravel, MD      . insulin aspart (novoLOG) injection 0-9 Units  0-9 Units Subcutaneous TID WC Jani Gravel, MD   1 Units at 01/28/19 0831  . ipratropium (ATROVENT HFA) inhaler 2 puff  2 puff Inhalation Q8H Hall, Carole N, DO      . lactulose (CHRONULAC) 10 GM/15ML solution 10 g   10 g Oral Daily Jani Gravel, MD   10 g at 01/28/19 1013  . letrozole [facility]) tablet 2.5 mg  2.5 mg Oral Daily Jani Gravel, MD      . levalbuterol [facility] HFA) inhaler 2 puff  2 puff Inhalation [address], Carole N, DO      . lisinopril (ZESTRIL) tablet 10 mg  10 mg Oral Daily Jani Gravel, MD   10 mg at 01/28/19 1017  . lubiprostone (AMITIZA) capsule 24 mcg  24 mcg Oral Daily Jani Gravel, MD      . memantine [facility]) tablet 5 mg  5 mg Oral BID Jani Gravel, MD   5 mg at 01/28/19 1018  . metFORMIN (GLUCOPHAGE) tablet 500 mg  500 mg Oral Q breakfast Jani Gravel, MD   500 mg at 01/28/19 4656  . ondansetron (ZOFRAN-ODT) disintegrating tablet 4 mg  4 mg Oral Q8H PRN Jani Gravel, MD      . pantoprazole (PROTONIX) EC tablet 40 mg  40 mg Oral Daily Jani Gravel, MD   40 mg at 01/28/19 1018  . remdesivir 100 mg in sodium chloride 0.9 % 100 mL IVPB  100 mg Intravenous Daily Jani Gravel, MD      . risperiDONE (RISPERDAL) tablet 0.25 mg  0.25 mg Oral BID PRN Jani Gravel, MD      . temazepam (RESTORIL) capsule 15 mg  15 mg Oral Loma Sousa, MD   15 mg at 01/27/19 2301  . traZODone (DESYREL) tablet 100 mg  100 mg Oral QHS Jani Gravel, MD   100 mg at 01/27/19 2300  . zinc sulfate capsule 220 mg  220 mg Oral Daily Kayleen Memos, DO       Current Outpatient Medications  Medication Sig Dispense Refill  . acetaminophen (TYLENOL) 500 MG tablet Take 500 mg by mouth every 8 (eight) hours as needed for mild pain.    Marland Kitchen amLODipine (NORVASC) 5 MG tablet Take 1 tablet (5 mg total) by mouth daily. 30 tablet 3  . Ascorbic Acid (VITAMIN C) 1000 MG tablet Take 1,000 mg by mouth daily.    . baclofen (LIORESAL) 10 MG tablet Take 10 mg by mouth daily as needed for muscle spasms.    Marland Kitchen buPROPion (WELLBUTRIN XL) 150 MG 24 hr tablet Take 150 mg by mouth daily.    . cyclobenzaprine (FLEXERIL) 5 MG tablet Take 5 mg by mouth daily.    . diazepam (VALIUM) 5 MG tablet Take 5 mg by mouth every 8 (eight) hours as needed for anxiety.    Marland Kitchen  doxycycline (ADOXA) 100 MG tablet Take 100 mg by mouth once.    . fentaNYL (DURAGESIC) 50 MCG/HR Place 1 patch onto the skin as directed. Every 48 hours    . HYDROcodone-acetaminophen (NORCO/VICODIN) 5-325 MG tablet Take 1 tablet by mouth every 4 (four) hours as needed for moderate pain.    Marland Kitchen INVEGA 6 MG 24 hr tablet  Take 6 mg by mouth 2 (two) times daily.   1  . INVEGA SUSTENNA 156 MG/ML SUSP injection Inject 1 mL (156 mg total) into the muscle every 30 (thirty) days. 0.9 mL 0  . lactulose (CHRONULAC) 10 GM/15ML solution Take 10 g by mouth daily.   3  . letrozole (FEMARA) 2.5 MG tablet Take 2.5 mg by mouth daily.    Marland Kitchen linaclotide (LINZESS) 290 MCG CAPS capsule Take 1 capsule (290 mcg total) by mouth daily. 30 capsule 0  . lisinopril (PRINIVIL,ZESTRIL) 10 MG tablet Take 10 mg by mouth daily.     Marland Kitchen lubiprostone (AMITIZA) 24 MCG capsule Take 24 mcg by mouth daily.    . memantine (NAMENDA) 5 MG tablet Take 5 mg by mouth 2 (two) times daily.    . metFORMIN (GLUCOPHAGE) 500 MG tablet Take 500 mg by mouth daily with breakfast.     . Multiple Vitamin (MULTIVITAMIN WITH MINERALS) TABS tablet Take 1 tablet by mouth daily.    . ondansetron (ZOFRAN-ODT) 4 MG disintegrating tablet Take 4 mg by mouth every 8 (eight) hours as needed for nausea/vomiting.    Marland Kitchen QUEtiapine (SEROQUEL) 100 MG tablet Take 100 mg by mouth 2 (two) times daily.    . risperiDONE (RISPERDAL) 0.25 MG tablet Take 0.25 mg by mouth 2 (two) times daily as needed (agitation).     . STOOL SOFTENER 100 MG capsule Take 100 mg by mouth 2 (two) times daily.    . temazepam (RESTORIL) 15 MG capsule Take 15 mg by mouth at bedtime.    . thiamine 100 MG tablet Take 1 tablet (100 mg total) by mouth daily. 30 tablet 3  . traZODone (DESYREL) 50 MG tablet Take 100 mg by mouth at bedtime.      Musculoskeletal: Strength & Muscle Tone: Unable to assess Gait & Station: Unable to assess Patient leans: Unable to assess  Psychiatric Specialty Exam: Physical  Exam  Nursing note and vitals reviewed. Constitutional: She appears well-developed.  HENT:  Head: Normocephalic.  Cardiovascular: Normal rate.  Respiratory: Effort normal.  Neurological: She is alert.    Review of Systems  Constitutional: Negative.   HENT: Negative.   Eyes: Negative.   Respiratory: Negative.   Cardiovascular: Negative.   Gastrointestinal: Negative.   Genitourinary: Negative.   Musculoskeletal: Negative.   Skin: Negative.   Neurological: Negative.   Psychiatric/Behavioral: Positive for confusion.    Blood pressure 101/64, pulse 84, temperature 98.1 F (36.7 C), temperature source Oral, resp. rate 10, height '5\' 5"'  (1.651 m), SpO2 95 %.Body mass index is 18.12 kg/m.  General Appearance: Fairly Groomed  Eye Contact:  Fair  Speech:  Clear and Coherent and Normal Rate  Volume:  Normal  Mood:  Euthymic  Affect:  Appropriate and Congruent  Thought Process:  Coherent, Goal Directed and Descriptions of Associations: Intact  Orientation:  Other:  Self and situation  Thought Content:  Logical  Suicidal Thoughts:  No  Homicidal Thoughts:  No  Memory:  Immediate;   Fair Remote;   Fair  Judgement:  Fair  Insight:  Fair  Psychomotor Activity:  Normal  Concentration:  Concentration: Poor  Recall:  [place] of Knowledge:  Good  Language:  Good  Akathisia:  No  Handed:  Right  AIMS (if indicated):     Assets:  Agricultural consultant Housing Intimacy Leisure Time Physical Health Resilience Social Support  ADL's:  Impaired  Cognition:  Impaired,  Mild  Sleep:  Treatment Plan Summary: Recommend consider continue home medications  Patient cleared by psychiatry.  Disposition: No evidence of imminent risk to self or others at present.   Patient does not meet criteria for psychiatric inpatient admission. Supportive therapy provided about ongoing stressors.  This service was provided via telemedicine using a 2-way,  interactive audio and video technology.  Names of all persons participating in this telemedicine service and their role in this encounter. Name: [name] Role: Patient  Name: Roseanne Kaufman telephone Role: Patient's daughter  Name: Letitia Libra Role: [place], FNP 01/28/2019 11:44 AM

## 2019-01-28 NOTE — ED Notes (Signed)
+  tele  [facility] called to transport patient

## 2019-01-28 NOTE — ED Notes (Signed)
+  tele  Breakfast ordered 

## 2019-01-28 NOTE — ED Notes (Signed)
Spoke with Dr. Nevada Crane regarding pt's BP changes while asleep. Will continue to monitor.

## 2019-01-28 NOTE — ED Notes (Signed)
[facility] contacted for transport  

## 2019-01-28 NOTE — Progress Notes (Signed)
Pharmacy Antibiotic Note  Kristen Colon is a 69 y.o. female admitted on 01/27/2019 with increased SOB, known COVID +.  Pharmacy has been consulted for Cipro for empiric UTI dosing.  Plan: Cipro 400mg  IV q12h Follow-up and change to PO as able.  Height: 5\' 5"  (165.1 cm) IBW/kg (Calculated) : 57  Temp (24hrs), Avg:99.7 F (37.6 C), Min:98.1 F (36.7 C), Max:100.9 F (38.3 C)  Recent Labs  Lab 01/27/19 1730  WBC 6.2  CREATININE 0.77  LATICACIDVEN 1.4    CrCl cannot be calculated (Unknown ideal weight.).    Allergies  Allergen Reactions  . Penicillins Other (See Comments)    edema  . Percocet [Oxycodone-Acetaminophen] Itching     Thank you for allowing pharmacy to be a part of this patient's care.  Elmer Ramp 01/28/2019 1:22 AM

## 2019-01-28 NOTE — Progress Notes (Signed)
Patient had x3 episodes of vomiting of clear, yellow emesis. Patient given ODT zofran and notified MD d/t patient unable to take Amitiza and Femara. Patient had undissolved pills in emesis as well. IV Zofran PRN added per MD if ODT i

## 2019-01-28 NOTE — Plan of Care (Signed)
Patient has been stable on 2.5LPM per [place] since arriving to room. Patient is withdrawn and calm. Disoriented to time only. Spoke with patient's daughter regarding admission assessment questions and status since arriving @ facility. Call light in reach, bed alarm in place.   Problem: Education: Goal: Knowledge of risk factors and measures for prevention of condition will improve Outcome: Progressing   Problem: Coping: Goal: Psychosocial and spiritual needs will be supported Outcome: Progressing   Problem: Respiratory: Goal: Will maintain a patent airway Outcome: Progressing Goal: Complications related to the disease process, condition or treatment will be avoided or minimized Outcome: Progressing   Problem: Education: Goal: Knowledge of General Education information will improve Description: Including pain rating scale, medication(s)/side effects and non-pharmacologic comfort measures Outcome: Progressing   Problem: Health Behavior/Discharge Planning: Goal: Ability to manage health-related needs will improve Outcome: Progressing   Problem: Clinical Measurements: Goal: Ability to maintain clinical measurements within normal limits will improve Outcome: Progressing Goal: Will remain free from infection Outcome: Progressing Goal: Diagnostic test results will improve Outcome: Progressing Goal: Respiratory complications will improve Outcome: Progressing Goal: Cardiovascular complication will be avoided Outcome: Progressing   Problem: Activity: Goal: Risk for activity intolerance will decrease Outcome: Progressing   Problem: Nutrition: Goal: Adequate nutrition will be maintained Outcome: Progressing   Problem: Coping: Goal: Level of anxiety will decrease Outcome: Progressing   Problem: Elimination: Goal: Will not experience complications related to bowel motility Outcome: Progressing Goal: Will not experience complications related to urinary retention Outcome:  Progressing   Problem: Pain Managment: Goal: General experience of comfort will improve Outcome: Progressing   Problem: Safety: Goal: Ability to remain free from injury will improve Outcome: Progressing   Problem: Skin Integrity: Goal: Risk for impaired skin integrity will decrease Outcome: Progressing

## 2019-01-29 DIAGNOSIS — J1282 Pneumonia due to Coronavirus disease 2019: Secondary | ICD-10-CM | POA: Diagnosis not present

## 2019-01-29 DIAGNOSIS — U071 COVID-19: Secondary | ICD-10-CM | POA: Diagnosis present

## 2019-01-29 DIAGNOSIS — R05 Cough: Secondary | ICD-10-CM | POA: Diagnosis not present

## 2019-01-29 LAB — CBC WITH DIFFERENTIAL/PLATELET
Abs Immature Granulocytes: 0.02 10*3/uL (ref 0.00–0.07)
Basophils Absolute: 0 10*3/uL (ref 0.0–0.1)
Basophils Relative: 0 %
Eosinophils Absolute: 0 10*3/uL (ref 0.0–0.5)
Eosinophils Relative: 0 %
HCT: 35.6 % — ABNORMAL LOW (ref 36.0–46.0)
Hemoglobin: 11 g/dL — ABNORMAL LOW (ref 12.0–15.0)
Immature Granulocytes: 0 %
Lymphocytes Relative: 19 %
Lymphs Abs: 1.2 10*3/uL (ref 0.7–4.0)
MCH: 27.2 pg (ref 26.0–34.0)
MCHC: 30.9 g/dL (ref 30.0–36.0)
MCV: 87.9 fL (ref 80.0–100.0)
Monocytes Absolute: 0.2 10*3/uL (ref 0.1–1.0)
Monocytes Relative: 4 %
Neutro Abs: 4.7 10*3/uL (ref 1.7–7.7)
Neutrophils Relative %: 77 %
Platelets: 268 10*3/uL (ref 150–400)
RBC: 4.05 MIL/uL (ref 3.87–5.11)
RDW: 16.8 % — ABNORMAL HIGH (ref 11.5–15.5)
WBC: 6.1 10*3/uL (ref 4.0–10.5)
nRBC: 0 % (ref 0.0–0.2)

## 2019-01-29 LAB — COMPREHENSIVE METABOLIC PANEL
ALT: 20 U/L (ref 0–44)
AST: 25 U/L (ref 15–41)
Albumin: 3.1 g/dL — ABNORMAL LOW (ref 3.5–5.0)
Alkaline Phosphatase: 63 U/L (ref 38–126)
Anion gap: 8 (ref 5–15)
BUN: 17 mg/dL (ref 8–23)
CO2: 24 mmol/L (ref 22–32)
Calcium: 8.4 mg/dL — ABNORMAL LOW (ref 8.9–10.3)
Chloride: 104 mmol/L (ref 98–111)
Creatinine, Ser: 0.61 mg/dL (ref 0.44–1.00)
GFR calc Af Amer: >60 mL/min (ref >60)
GFR calc non Af Amer: >60 mL/min (ref >60)
Glucose, Bld: 166 mg/dL — ABNORMAL HIGH (ref 70–99)
Potassium: 4 mmol/L (ref 3.5–5.1)
Sodium: 136 mmol/L (ref 135–145)
Total Bilirubin: 0.6 mg/dL (ref 0.3–1.2)
Total Protein: 6.5 g/dL (ref 6.5–8.1)

## 2019-01-29 LAB — GLUCOSE, CAPILLARY
Glucose-Capillary: 144 mg/dL — ABNORMAL HIGH (ref 70–99)
Glucose-Capillary: 126 mg/dL — ABNORMAL HIGH (ref 70–99)
Glucose-Capillary: 187 mg/dL — ABNORMAL HIGH (ref 70–99)

## 2019-01-29 LAB — C-REACTIVE PROTEIN: CRP: 7.6 mg/dL — ABNORMAL HIGH (ref <1.0)

## 2019-01-29 LAB — FERRITIN: Ferritin: 381 ng/mL — ABNORMAL HIGH (ref 11–307)

## 2019-01-29 LAB — D-DIMER, QUANTITATIVE: D-Dimer, Quant: 2.47 ug/mL-FEU — ABNORMAL HIGH (ref 0.00–0.50)

## 2019-01-29 LAB — LACTATE DEHYDROGENASE: LDH: 199 U/L — ABNORMAL HIGH (ref 98–192)

## 2019-01-29 MED ORDER — SODIUM CHLORIDE 0.9 % IV BOLUS
500.0000 mL | Freq: Once | INTRAVENOUS | Status: AC
  Administered 2019-01-29: 500 mL via INTRAVENOUS

## 2019-01-29 MED ORDER — QUETIAPINE FUMARATE 25 MG PO TABS
100.0000 mg | ORAL_TABLET | Freq: Two times a day (BID) | ORAL | Status: DC
  Administered 2019-01-29 – 2019-02-10 (×25): 100 mg via ORAL
  Filled 2019-01-29 (×25): qty 4

## 2019-01-29 NOTE — Evaluation (Signed)
Physical Therapy Evaluation Patient Details Name: Kristen Colon MRN: [id number] DOB: Jul 12, 1950 Today's Date: 01/29/2019   History of Present Illness  69 y/o female pt w/ hx of schizophrenia, HTN, headache, dyspnea, dementia, anxiety, recently dx with COVID at ALF had c/o dyspnea. Pt admitted for acute respiratory failure with hypoxia and covid-19  Clinical Impression   Female pt admitted with above dx, chart states she was living at ALF prior to admission, she states that she was living with her daughter. Pt is a poor historian with a hx of dementia. This pm pt is pleasantly confused and very agreeable to tx, She needed min assist and hand held assist with most mobility, was able to sit unsupported edge of bed without loss of balance, stand with B hands assist, noted some retropulsion with standing, pt was not aware of this. States she's not leaning back her stomach is sticking out. Pt was able to take some steps at edge of bed to left and right and then ambulate few feet to relciner all with hand held assist and min a. Pt was on 2L/min via [place], noted desat to low 80s with mobility but pleth was not reliable. Pt will benefit from skilled acute care level PT intervention while in hospital to improve on deficits in strength, balance and coordination, activity tolerance, independence and overall safety with functional mobility/activities. Pt may return to ALF at dc she would greatly benefit from continued rehabilitation if [facility] offers it.    Follow Up Recommendations Other (comment)(post acute care rehab if at ALF or SNF)    Equipment Recommendations  None recommended by PT    Recommendations for Other Services       Precautions / Restrictions Precautions Precautions: Fall Precaution Comments: cognition Restrictions Weight Bearing Restrictions: No      Mobility  Bed Mobility Overal bed mobility: Needs Assistance Bed Mobility: Supine to Sit     Supine to sit: Min assist     General  bed mobility comments: uses bed fixtures  Transfers Overall transfer level: Needs assistance Equipment used: 1 person hand held assist Transfers: Sit to/from [facility] Sit to Stand: Min assist Stand pivot transfers: Min assist       General transfer comment: noted pt is retropulsive, when asked why shes leaning back she states im not im stomach is just sticking out.  Ambulation/Gait Ambulation/Gait assistance: Min assist Gait Distance (Feet): 5 Feet Assistive device: 1 person hand held assist Gait Pattern/deviations: Shuffle Gait velocity: slow shuffle   General Gait Details: takes few steps to L and R and from bed to recliner with HHA  Stairs            Wheelchair Mobility    Modified Rankin (Stroke Patients Only)       Balance Overall balance assessment: Needs assistance Sitting-balance support: Feet supported Sitting balance-Leahy Scale: Fair   Postural control: Posterior lean Standing balance support: During functional activity;Bilateral upper extremity supported Standing balance-Leahy Scale: Poor Standing balance comment: leaning back                              Pertinent Vitals/Pain Pain Assessment: No/denies pain    Home Living Family/patient expects to be discharged to:: Assisted living                 Additional Comments: not sure of accuracy but states was living home with daughter, she states was able to complet ADLs on  her own, pt does have hx of dementia and schizophrenia. Chart states she was living at ALF    Prior Function           Comments: states was independent and did not need an AD     Hand Dominance        Extremity/Trunk Assessment   Upper Extremity Assessment Upper Extremity Assessment: Generalized weakness    Lower Extremity Assessment Lower Extremity Assessment: Generalized weakness    Cervical / Trunk Assessment Cervical / Trunk Assessment: Normal  Communication    Communication: No difficulties  Cognition Arousal/Alertness: Awake/alert Behavior During Therapy: Anxious Overall Cognitive Status: No family/caregiver present to determine baseline cognitive functioning                                        General Comments      Exercises     Assessment/Plan    PT Assessment Patient needs continued PT services  PT Problem List Decreased strength;Decreased activity tolerance;Decreased balance;Decreased mobility;Decreased coordination;Decreased cognition;Decreased knowledge of use of DME;Decreased safety awareness       PT Treatment Interventions DME instruction;Gait training;Functional mobility training;Therapeutic activities;Therapeutic exercise;Balance training;Neuromuscular re-education;Patient/family education    PT Goals (Current goals can be found in the Care Plan section)  Acute Rehab PT Goals Patient Stated Goal: did not state PT Goal Formulation: Patient unable to participate in goal setting Time For Goal Achievement: 02/12/19 Potential to Achieve Goals: Fair    Frequency Min 2X/week   Barriers to discharge        Co-evaluation               AM-PAC PT "6 Clicks" Mobility  Outcome Measure Help needed turning from your back to your side while in a flat bed without using bedrails?: A Little Help needed moving from lying on your back to sitting on the side of a flat bed without using bedrails?: A Little Help needed moving to and from a bed to a chair (including a wheelchair)?: A Little Help needed standing up from a chair using your arms (e.g., wheelchair or bedside chair)?: A Little Help needed to walk in hospital room?: A Lot Help needed climbing 3-5 steps with a railing? : A Lot 6 Click Score: 16    End of Session Equipment Utilized During Treatment: Oxygen Activity Tolerance: Treatment limited secondary to medical complications (Comment);Patient limited by fatigue Patient left: in chair;with call  bell/phone within reach;with nursing/sitter in room   PT Visit Diagnosis: Unsteadiness on feet (R26.81);Muscle weakness (generalized) (M62.81)    Time: [id number] PT Time Calculation (min) (ACUTE ONLY): 17 min   Charges:   PT Evaluation $PT Eval Moderate Complexity: [place], PT   Delford Field 01/29/2019, 3:57 PM

## 2019-01-29 NOTE — Progress Notes (Signed)
MEWS yellow for BP 89/63 (72) HR 93. Pt A/Ox3; baseline;  denies lightheadedness/ dizziness. MD paged, received order for 500ML bolus NS.

## 2019-01-29 NOTE — Progress Notes (Signed)
Pt's daughter [name] called and wanted to speak with her. Updated her on on pt status and plan of care. Allowed daughter to speak with Pt.

## 2019-01-29 NOTE — Progress Notes (Signed)
PROGRESS NOTE    Kristen Colon  [id number] DOB: [birth year] DOA: 01/27/2019 PCP: Everardo Beals, NP    Brief Narrative:  vesa aloe y.o.female with past medical history of hypertension, type 2 diabetes, schizophrenia, chronic anxiety, breast cancer on letrozole, dementia who presented from ALF with complaints of dyspnea.  Was sent to the ED for further evaluation.  She had a recent positive COVID-19 test at ALF.  On presentation to the ED she is febrile, tachycardic and hypoxic.  COVID-19 screening test positive on 01/27/2019.  CTA chest showed no evidence of pulmonary embolism however showed bilateral pulmonary patchy opacities suggestive of COVID-19 viral pneumonia.  Was started on COVID-19 directed therapies.  Negative procalcitonin.  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 chest physiotherapy, incentive spirometry, deep breathing exercises, sputum induction, mucolytic's and bronchodilators. Supplemental oxygen to keep saturations more than 90%. Covid directed therapy with , steroids, on dexamethasone remdesivir, day 3/5 antibiotics, not indicated Due to severity of symptoms, patient will need daily inflammatory markers, liver function test to monitor and direct COVID-19 therapies. PT OT today.  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  Outpatient oncology follow-up.  Patient followed by hospice.  Paranoid schizophrenia: Stable.  No acute  delusion, hallucinations.  No suicidal or homicidal ideations.  Dementia: All-time fall precautions.  Delirium precautions.  She is on multiple medication regimen with Risperdal and Seroquel that she will continue. Patient's daughter says that she has no memory loss.  Type 2 diabetes with hyperglycemia: Holding off oral hypoglycemics.  On insulin coverage while on steroids.   DVT prophylaxis: Lovenox subcu Code Status: Full code.  Patient on hospice at home. Family Communication: Patient's daughter on the phone Disposition Plan: Home after clinical stabilization with daughter.   Consultants:   None  Procedures:   None  Antimicrobials:  Anti-infectives (From admission, onward)   Start     Dose/Rate Route Frequency Ordered Stop   01/28/19 2000  remdesivir 100 mg in sodium chloride 0.9 % 100 mL IVPB     100 mg 200 mL/hr over 30 Minutes Intravenous Daily 01/27/19 1940 02/01/19 1959   01/28/19 1000  cefTRIAXone (ROCEPHIN) 1 g in sodium chloride 0.9 % 100 mL IVPB  Status:  Discontinued     1 g 200 mL/hr over 30 Minutes Intravenous Every 24 hours 01/28/19 0834 01/28/19 1159   01/28/19 0200  ciprofloxacin (CIPRO) IVPB 400 mg  Status:  Discontinued     400 mg 200 mL/hr over 60 Minutes Intravenous Every 12 hours 01/28/19 0124 01/28/19 0834   01/27/19 2000  remdesivir 200 mg in sodium chloride 0.9% 250 mL IVPB     200 mg 580 mL/hr over 30 Minutes Intravenous Once 01/27/19 1940 01/27/19 2256         Subjective: Patient seen and examined.  No overnight events.  Poor historian.  She had some vomiting that she thinks due to taking medicine in empty stomach.  Remains afebrile.  On 2 L oxygen.  Objective: Vitals:   01/29/19 0330  01/29/19 0600 01/29/19 0801 01/29/19 1212  BP: 110/65 131/71 123/71 117/69  Pulse: 85 91 85 92  Resp: 11 12 11 13   Temp: 97.7 F (36.5 C)   97.9 F (36.6 C)  TempSrc: Oral   Oral  SpO2: 93% 96% 92% 97%  Weight:      Height:        Intake/Output  Summary (Last 24 hours) at 01/29/2019 1503 Last data filed at 01/29/2019 0545 Gross per 24 hour  Intake 720.03 ml  Output 500 ml  Net 220.03 ml   Filed Weights   01/28/19 1200  Weight: 49.4 kg    Examination:  General exam: Appears calm and comfortable, pleasantly confused. Respiratory system: Clear to auscultation. Respiratory effort normal.  No added sounds.  On 2 L oxygen. Cardiovascular system: S1 & S2 heard, RRR. No JVD, murmurs, rubs, gallops or clicks. No pedal edema. Gastrointestinal system: Abdomen is nondistended, soft and nontender. No organomegaly or masses felt. Normal bowel sounds heard. Central nervous system: Alert but not oriented.   Extremities: Symmetric 5 x 5 power. Skin: No rashes, lesions or ulcers Psychiatry: Judgement and insight appear compromised.    Data Reviewed: I have personally reviewed following labs and imaging studies  CBC: Recent Labs  Lab 01/27/19 1730 01/28/19 0542 01/29/19 0330  WBC 6.2 6.8 6.1  NEUTROABS 4.5 5.5 4.7  HGB 12.3 11.9* 11.0*  HCT 37.7 37.2 35.6*  MCV 88.1 89.0 87.9  PLT 233 242 [id number]   Basic Metabolic Panel: Recent Labs  Lab 01/27/19 1730 01/28/19 0542 01/29/19 0330  NA 133* 133* 136  K 4.0 4.1 4.0  CL 99 101 104  CO2 24 22 24   GLUCOSE 139* 181* 166*  BUN 12 11 17   CREATININE 0.77 0.71 0.61  CALCIUM 8.5* 8.3* 8.4*   GFR: Estimated Creatinine Clearance: 51.8 mL/min (by C-G formula based on SCr of 0.61 mg/dL). Liver Function Tests: Recent Labs  Lab 01/27/19 1730 01/28/19 0542 01/29/19 0330  AST 26 24 25   ALT 18 19 20   ALKPHOS 64 67 63  BILITOT 0.3 0.2* 0.6  PROT 6.9 6.6 6.5  ALBUMIN 3.4* 2.9* 3.1*   No results for input(s): LIPASE, AMYLASE in the last 168 hours. No results for input(s): AMMONIA in the last 168 hours. Coagulation Profile: No results for input(s): INR, PROTIME in the last 168 hours. Cardiac Enzymes: No results for input(s): CKTOTAL, CKMB, CKMBINDEX, TROPONINI in the last 168  hours. BNP (last 3 results) No results for input(s): PROBNP in the last 8760 hours. HbA1C: No results for input(s): HGBA1C in the last 72 hours. CBG: Recent Labs  Lab 01/28/19 0124 01/28/19 0820 01/28/19 1205 01/28/19 1545 01/29/19 0759  GLUCAP 186* 123* 86 107* 144*   Lipid Profile: Recent Labs    01/27/19 1730  TRIG 95   Thyroid Function Tests: No results for input(s): TSH, T4TOTAL, FREET4, T3FREE, THYROIDAB in the last 72 hours. Anemia Panel: Recent Labs    01/27/19 1730 01/29/19 0330  FERRITIN 261 381*   Sepsis Labs: Recent Labs  Lab 01/27/19 1730  PROCALCITON <0.10  LATICACIDVEN 1.4    Recent Results (from the past 240 hour(s))  Blood Culture (routine x 2)     Status: None (Preliminary result)   Collection Time: 01/27/19  5:11 PM   Specimen: BLOOD  Result Value Ref Range Status   Specimen Description BLOOD RIGHT ANTECUBITAL  Final   Special Requests   Final    BOTTLES DRAWN AEROBIC AND ANAEROBIC Blood Culture adequate  volume   Culture   Final    NO GROWTH 2 DAYS Performed at [facility], [place] [address], [place], [place] [address]    Report Status PENDING  Incomplete  SARS CORONAVIRUS 2 (TAT 6-24 HRS) Nasopharyngeal Nasopharyngeal Swab     Status: Abnormal   Collection Time: 01/27/19  5:19 PM   Specimen: Nasopharyngeal Swab  Result Value Ref Range Status   SARS Coronavirus 2 POSITIVE (A) NEGATIVE Final    Comment: RESULT CALLED TO, READ BACK BY AND VERIFIED WITH: Neldon Mc 2203 01/27/2019 D BRADLEY (NOTE) SARS-CoV-2 target nucleic acids are DETECTED. The SARS-CoV-2 RNA is generally detectable in upper and lower respiratory specimens during the acute phase of infection. Positive results are indicative of the presence of SARS-CoV-2 RNA. Clinical correlation with patient history and other diagnostic information is  necessary to determine patient infection status. Positive results do not rule out bacterial infection or co-infection with other  viruses.  The expected result is Negative. Fact Sheet for Patients: SugarRoll.be Fact Sheet for Healthcare Providers: https://www.woods-mathews.com/ This test is not yet approved or cleared by the [place] FDA and  has been authorized for detection and/or diagnosis of SARS-CoV-2 by FDA under an Emergency Use Authorization (EUA). This EUA will remain  in effect (meaning this test can be used) for t he duration of the COVID-19 declaration under Section 564(b)(1) of the Act, 21 U.S.C. section 360bbb-3(b)(1), unless the authorization is terminated or revoked sooner. Performed at [facility], [place] [address] [place], [place] [address]          Radiology Studies: CT ANGIO CHEST PE W OR WO CONTRAST  Result Date: 01/27/2019 CLINICAL DATA:  Tachycardia and elevated D-dimer. EXAM: CT ANGIOGRAPHY CHEST WITH CONTRAST TECHNIQUE: Multidetector CT imaging of the chest was performed using the standard protocol during bolus administration of intravenous contrast. Multiplanar CT image reconstructions and MIPs were obtained to evaluate the vascular anatomy. CONTRAST:  14mL OMNIPAQUE IOHEXOL 350 MG/ML SOLN COMPARISON:  September 29, 2018 FINDINGS: Cardiovascular: Satisfactory opacification of the pulmonary arteries to the segmental level. No evidence of pulmonary embolism. Normal heart size. No pericardial effusion. Mediastinum/Nodes: No enlarged mediastinal, hilar, or axillary lymph nodes. Thyroid gland, trachea, and esophagus demonstrate no significant findings. Lungs/Pleura: Mild emphysematous lung disease is seen involving the posterior aspect of the bilateral upper lobes. Mild atelectatic changes are also seen within the posterior aspect of the bilateral upper lobes, anteromedial aspect of the right upper lobe and posterior aspect of the bilateral lung bases. There is no evidence of a pleural effusion or pneumothorax. Upper Abdomen: No acute abnormality.  Musculoskeletal: No chest wall abnormality. No acute or significant osseous findings. Review of the MIP images confirms the above findings. IMPRESSION: 1. No evidence of pulmonary embolus. 2. Mild bilateral upper lobe and bibasilar atelectasis. Emphysema (ICD10-J43.9). Electronically Signed   By: Virgina Norfolk M.D.   On: 01/27/2019 20:49   DG Chest Port 1 View  Result Date: 01/27/2019 CLINICAL DATA:  Fever and shortness of breath, possible COVID-19 infection EXAM: PORTABLE CHEST 1 VIEW COMPARISON:  07/29/2017 FINDINGS: Cardiac shadows within normal limits. The lungs are well aerated bilaterally. No focal infiltrate or sizable effusion is seen. No bony abnormality is noted. IMPRESSION: No active disease. Electronically Signed   By: Inez Catalina M.D.   On: 01/27/2019 17:27        Scheduled Meds: . amLODipine  5 mg Oral Daily  . vitamin C  500 mg Oral Daily  .  buPROPion  150 mg Oral Daily  . cholecalciferol  1,000 Units Oral Daily  . cyclobenzaprine  5 mg Oral Daily  . dexamethasone (DECADRON) injection  6 mg Intravenous Q24H  . docusate sodium  100 mg Oral BID  . enoxaparin (LOVENOX) injection  40 mg Subcutaneous Q24H  . fentaNYL  1 patch Transdermal Q72H  . insulin aspart  0-5 Units Subcutaneous QHS  . insulin aspart  0-9 Units Subcutaneous TID WC  . ipratropium  2 puff Inhalation Q8H  . lactulose  10 g Oral Daily  . letrozole  2.5 mg Oral Daily  . levalbuterol  2 puff Inhalation Q8H  . lubiprostone  24 mcg Oral Daily  . memantine  5 mg Oral BID  . pantoprazole  40 mg Oral Daily  . QUEtiapine  100 mg Oral BID  . temazepam  15 mg Oral QHS  . traZODone  100 mg Oral QHS  . zinc sulfate  220 mg Oral Daily   Continuous Infusions: . remdesivir 100 mg in NS 100 mL Stopped (01/28/19 2011)     LOS: 2 days    Time spent: 25 minutes    Barb Merino, MD [facility] Pager [phone]

## 2019-01-29 NOTE — Progress Notes (Signed)
GV [id number] GIP Note   Kristen Colon is under hospice services for a terminal diagnosis of ovarian cancer per Dr. Karie Georges with [facility].  Pt had tested positive for COVID and per her daughter began running a fever and was SOB.  Hospice RN made home visit and daughter made decision to send her mother to the ED for further evaluation.  This is a related hospital admission.   Pt transferred to [facility] hospital from [facility]. Patient had x3 episodes of vomiting last night of clear, yellow emesis. Patient given ODT Zofran. BP ran soft overnight and is WNL today after 562ml NS bolus.      V/S: BP: 110/65 131/71 123/71 117/69 Pulse: 85 91 85 92 Resp: 11 12 11 13  Temp: 97.7 F (36.5 C)     97.9 F (36.6 C) SpO2: 93% 96% 92% 97%  Lab work:  SARS coronavirus 2 +, hgb 11, albumin 3.1, glucose 166, Ca 8.4 Diagnostics:  CT angio- Mild bilateral upper lobe and bibasilar atelectasis IVs/PRNs:  NS @ 77ml/hr, rocephin 1 g IV x 1, cipro 400 mg IV BID, remdesivir 200 mg IV, decadron 6 mg IV QD   Assessment & Plan:  Principal Problem:   Pneumonia due to COVID-19 virus Active Problems:   Schizophrenia, undifferentiated (HCC)   Hypertension, uncontrolled   Diabetes mellitus type 2 in nonobese (HCC)   Left ovarian epithelial cancer (HCC)   Acute respiratory failure with hypoxia (HCC)   COVID-19 virus infection   Pneumonia due to COVID 19 virus, acute hypoxic respiratory failure due to COVID 19 virus: Continue to monitor due to significant symptoms , patient is still on supplemental oxygen. chest physiotherapy, incentive spirometry, deep breathing exercises, sputum induction, mucolytic's and bronchodilators. Supplemental oxygen to keep saturations more than 90%. Covid directed therapy with , steroids, on dexamethasone remdesivir, day 3/5 antibiotics, not indicated Due to severity of symptoms, patient will need daily inflammatory markers, liver function test to monitor and direct  COVID-19 therapies. PT OT today.   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  Outpatient oncology follow-up.  Patient followed by hospice.   Paranoid schizophrenia: Stable.  No acute delusion, hallucinations.  No suicidal or homicidal ideations.   Dementia: All-time fall precautions.  Delirium precautions.  She is on multiple medication regimen with Risperdal and Seroquel that she will continue. Patient's daughter says that she has no memory loss.  Type 2 diabetes with hyperglycemia: Holding off oral hypoglycemics.  On insulin coverage while on steroids.   D/C planning:  Return home with daughter [name] once able to; [place] tested positive for Covid as well and is trying to care for herself and her son, who tested negative. [name] is hoping pt can fully recover in hospital prior to returning home.  GOC:  Unclear, she remains a full code IDT:  Updated Family:  Updated  Freddie Breech, RN  [facility] Liaison [phone]

## 2019-01-30 DIAGNOSIS — R05 Cough: Secondary | ICD-10-CM | POA: Diagnosis not present

## 2019-01-30 DIAGNOSIS — J1282 Pneumonia due to Coronavirus disease 2019: Secondary | ICD-10-CM | POA: Diagnosis not present

## 2019-01-30 DIAGNOSIS — U071 COVID-19: Secondary | ICD-10-CM | POA: Diagnosis not present

## 2019-01-30 LAB — CULTURE, BLOOD (ROUTINE X 2): Special Requests: ADEQUATE

## 2019-01-30 LAB — CBC WITH DIFFERENTIAL/PLATELET
Abs Immature Granulocytes: 0.01 10*3/uL (ref 0.00–0.07)
Basophils Absolute: 0 10*3/uL (ref 0.0–0.1)
Basophils Relative: 0 %
Eosinophils Absolute: 0 10*3/uL (ref 0.0–0.5)
Eosinophils Relative: 0 %
HCT: 36.5 % (ref 36.0–46.0)
Hemoglobin: 11.5 g/dL — ABNORMAL LOW (ref 12.0–15.0)
Immature Granulocytes: 0 %
Lymphocytes Relative: 23 %
Lymphs Abs: 1.4 10*3/uL (ref 0.7–4.0)
MCH: 27.3 pg (ref 26.0–34.0)
MCHC: 31.5 g/dL (ref 30.0–36.0)
MCV: 86.7 fL (ref 80.0–100.0)
Monocytes Absolute: 0.5 10*3/uL (ref 0.1–1.0)
Monocytes Relative: 8 %
Neutro Abs: 4 10*3/uL (ref 1.7–7.7)
Neutrophils Relative %: 69 %
Platelets: 307 10*3/uL (ref 150–400)
RBC: 4.21 MIL/uL (ref 3.87–5.11)
RDW: 16.9 % — ABNORMAL HIGH (ref 11.5–15.5)
WBC: 5.8 10*3/uL (ref 4.0–10.5)
nRBC: 0 % (ref 0.0–0.2)

## 2019-01-30 LAB — FERRITIN: Ferritin: 323 ng/mL — ABNORMAL HIGH (ref 11–307)

## 2019-01-30 LAB — COMPREHENSIVE METABOLIC PANEL
ALT: 30 U/L (ref 0–44)
AST: 33 U/L (ref 15–41)
Albumin: 3 g/dL — ABNORMAL LOW (ref 3.5–5.0)
Alkaline Phosphatase: 68 U/L (ref 38–126)
Anion gap: 9 (ref 5–15)
BUN: 15 mg/dL (ref 8–23)
CO2: 26 mmol/L (ref 22–32)
Calcium: 8.2 mg/dL — ABNORMAL LOW (ref 8.9–10.3)
Chloride: 100 mmol/L (ref 98–111)
Creatinine, Ser: 0.54 mg/dL (ref 0.44–1.00)
GFR calc Af Amer: >60 mL/min (ref >60)
GFR calc non Af Amer: >60 mL/min (ref >60)
Glucose, Bld: 194 mg/dL — ABNORMAL HIGH (ref 70–99)
Potassium: 4.3 mmol/L (ref 3.5–5.1)
Sodium: 135 mmol/L (ref 135–145)
Total Bilirubin: 0.3 mg/dL (ref 0.3–1.2)
Total Protein: 6.4 g/dL — ABNORMAL LOW (ref 6.5–8.1)

## 2019-01-30 LAB — C-REACTIVE PROTEIN: CRP: 3.9 mg/dL — ABNORMAL HIGH (ref <1.0)

## 2019-01-30 LAB — GLUCOSE, CAPILLARY
Glucose-Capillary: 105 mg/dL — ABNORMAL HIGH (ref 70–99)
Glucose-Capillary: 102 mg/dL — ABNORMAL HIGH (ref 70–99)
Glucose-Capillary: 132 mg/dL — ABNORMAL HIGH (ref 70–99)

## 2019-01-30 LAB — LACTATE DEHYDROGENASE: LDH: 223 U/L — ABNORMAL HIGH (ref 98–192)

## 2019-01-30 LAB — D-DIMER, QUANTITATIVE: D-Dimer, Quant: 2.13 ug/mL-FEU — ABNORMAL HIGH (ref 0.00–0.50)

## 2019-01-30 NOTE — Progress Notes (Signed)
GV [id number] GIP Note   Ms. Arevalos is under hospice services for a terminal diagnosis of ovarian cancer per Dr. Karie Georges with [facility].  Pt had tested positive for COVID and per her daughter began running a fever and was SOB.  Hospice RN made home visit and daughter made decision to send her mother to the ED for further evaluation.  This is a related hospital admission.   No acute changes overnight. Awaiting blood cultures.    V/S: BP: 105/70 Pulse:  93 Resp:   10 Temp:  98.2 F  SpO2:  93% on 3L via [place]   Lab work:  SARS coronavirus 2 +, hgb 11.5, albumin 3.0, glucose 194, Ca 8.2, D-dimer 2.3, c reactive protein 3.9  Diagnostics:  CT angio- Mild bilateral upper lobe and bibasilar atelectasis IVs/PRNs:  NS @ 34ml/hr, rocephin 1 g IV x 1, cipro 400 mg IV BID, remdesivir 200 mg IV, decadron 6 mg IV QD   Assessment & Plan:  Principal Problem:   Pneumonia due to COVID-19 virus Active Problems:   Schizophrenia, undifferentiated (HCC)   Hypertension, uncontrolled   Diabetes mellitus type 2 in nonobese (HCC)   Left ovarian epithelial cancer (HCC)   Acute respiratory failure with hypoxia (HCC)   COVID-19 virus infection   Pneumonia due to COVID 19 virus, acute hypoxic respiratory failure due to COVID 19 virus: Continue to monitor due to significant symptoms , patient is still on supplemental oxygen. chest physiotherapy, incentive spirometry, deep breathing exercises, sputum induction, mucolytic's and bronchodilators. Supplemental oxygen to keep saturations more than 90%. Covid directed therapy with , steroids, on dexamethasone remdesivir, day 4/5 antibiotics, not indicated Due to severity of symptoms, patient will need daily inflammatory markers, liver function test to monitor and direct COVID-19 therapies. PT OT and ambulation.   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  Outpatient  oncology follow-up.  Patient followed by hospice.   Paranoid schizophrenia: Stable.  No acute delusion, hallucinations.  No suicidal or homicidal ideations.   Dementia: All-time fall precautions.  Delirium precautions.  She is on multiple medication regimen with Risperdal and Seroquel that she will continue. Patient's daughter says that she has no memory loss.   Type 2 diabetes with hyperglycemia: Holding off oral hypoglycemics.  On insulin coverage while on steroids.   Positive blood cultures: Probably contaminant.  Will not start on any antibiotics.  Await final cultures.   D/C planning:  Return home with daughter [name] once able to; [place] tested positive for Covid as well and is trying to care for herself and her son, who tested negative. [name] is hoping pt can fully recover in hospital prior to returning home.  GOC:  Unclear, she remains a full code IDT:  Updated Family:  Updated   Freddie Breech, RN  [facility] Liaison

## 2019-01-30 NOTE — Progress Notes (Signed)
PROGRESS NOTE    GEETHIKA GLEDHILL  [id number] DOB: 1950/06/04 DOA: 01/27/2019 PCP: Everardo Beals, NP    Brief Narrative:  [age] y.o.female with past medical history of hypertension, type 2 diabetes, schizophrenia, chronic anxiety, breast cancer on letrozole, dementia who presented from home as brought by her daughter with fever and shortness of breath. On presentation to the ED she was febrile, tachycardic and hypoxic.  COVID-19 screening test positive on 01/27/2019.  CTA chest showed no evidence of pulmonary embolism however showed bilateral pulmonary patchy opacities suggestive of COVID-19 viral pneumonia.  Was started on COVID-19 directed therapies.  Negative procalcitonin. She goes to adult day care   Assessment & Plan:   Principal Problem:   Pneumonia due to COVID-19 virus Active Problems:   Schizophrenia, undifferentiated ([place])   Hypertension, uncontrolled   Diabetes mellitus type 2 in nonobese (HCC)   Left ovarian epithelial cancer ([place])   Acute respiratory failure with hypoxia ([place])   COVID-19 virus infection  Pneumonia due to COVID 19 virus, acute hypoxic respiratory failure due to COVID 19 virus: Continue to monitor due to significant symptoms , patient is still on supplemental oxygen. chest physiotherapy, incentive spirometry, deep breathing exercises, sputum induction, mucolytic's and bronchodilators. Supplemental oxygen to keep saturations more than 90%. Covid directed therapy with , steroids, on dexamethasone remdesivir, day 4/5 antibiotics, not indicated Due to severity of symptoms, patient will need daily inflammatory markers, liver function test to monitor and direct COVID-19 therapies. PT OT and ambulation.  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  Outpatient oncology follow-up.  Patient followed by hospice.  Paranoid schizophrenia: Stable.  No acute delusion, hallucinations.   No suicidal or homicidal ideations.  Dementia: All-time fall precautions.  Delirium precautions.  She is on multiple medication regimen with Risperdal and Seroquel that she will continue. Patient's daughter says that she has no memory loss.  Type 2 diabetes with hyperglycemia: Holding off oral hypoglycemics.  On insulin coverage while on steroids.  Positive blood cultures: Probably contaminant.  Will not start on any antibiotics.  Await final cultures.  DVT prophylaxis: Lovenox subcu Code Status: Full code.  However, patient on hospice at home. Family Communication: Patient's daughter on the phone Disposition Plan: Home after clinical stabilization with daughter.   Consultants:   None  Procedures:   None  Antimicrobials:  Anti-infectives (From admission, onward)   Start     Dose/Rate Route Frequency Ordered Stop   01/28/19 2000  remdesivir 100 mg in sodium chloride 0.9 % 100 mL IVPB     100 mg 200 mL/hr over 30 Minutes Intravenous Daily 01/27/19 1940 02/01/19 1959   01/28/19 1000  cefTRIAXone (ROCEPHIN) 1 g in sodium chloride 0.9 % 100 mL IVPB  Status:  Discontinued     1 g 200 mL/hr over 30 Minutes Intravenous Every 24 hours 01/28/19 0834 01/28/19 1159   01/28/19 0200  ciprofloxacin (CIPRO) IVPB 400 mg  Status:  Discontinued     400 mg 200 mL/hr over 60 Minutes Intravenous Every 12 hours 01/28/19 0124 01/28/19 0834   01/27/19 2000  remdesivir 200 mg in sodium chloride 0.9% 250 mL IVPB     200 mg 580 mL/hr over 30 Minutes Intravenous Once 01/27/19 1940 01/27/19 2256         Subjective: No overnight events.  No more nausea or vomiting.  Patient herself has no complaints.  Remains on 2 to 3 L of oxygen.  Objective: Vitals:  01/30/19 0130 01/30/19 0400 01/30/19 0810 01/30/19 0812  BP:  102/67  117/73  Pulse:  91  78  Resp:  12  15  Temp:  97.6 F (36.4 C) 98.2 F (36.8 C) 98.2 F (36.8 C)  TempSrc:  Oral Oral Oral  SpO2: 93% 95%  95%  Weight:      Height:         Intake/Output Summary (Last 24 hours) at 01/30/2019 1109 Last data filed at 01/29/2019 2114 Gross per 24 hour  Intake 280 ml  Output 1100 ml  Net -820 ml   Filed Weights   01/28/19 1200  Weight: 49.4 kg    Examination:  General exam: Appears calm and comfortable, pleasantly confused. Respiratory system: Clear to auscultation. Respiratory effort normal.  No added sounds.  On 3 L oxygen. Cardiovascular system: S1 & S2 heard, RRR. No JVD, murmurs, rubs, gallops or clicks. No pedal edema. Gastrointestinal system: Abdomen is nondistended, soft and nontender. No organomegaly or masses felt. Normal bowel sounds heard. Central nervous system: Alert but not oriented.   Extremities: Symmetric 5 x 5 power. Skin: No rashes, lesions or ulcers Psychiatry: Judgement and insight appear compromised.    Data Reviewed: I have personally reviewed following labs and imaging studies  CBC: Recent Labs  Lab 01/27/19 1730 01/28/19 0542 01/29/19 0330 01/30/19 0630  WBC 6.2 6.8 6.1 5.8  NEUTROABS 4.5 5.5 4.7 4.0  HGB 12.3 11.9* 11.0* 11.5*  HCT 37.7 37.2 35.6* 36.5  MCV 88.1 89.0 87.9 86.7  PLT 233 242 268 [id number]   Basic Metabolic Panel: Recent Labs  Lab 01/27/19 1730 01/28/19 0542 01/29/19 0330 01/30/19 0630  NA 133* 133* 136 135  K 4.0 4.1 4.0 4.3  CL 99 101 104 100  CO2 24 22 24 26   GLUCOSE 139* 181* 166* 194*  BUN 12 11 17 15   CREATININE 0.77 0.71 0.61 0.54  CALCIUM 8.5* 8.3* 8.4* 8.2*   GFR: Estimated Creatinine Clearance: 51.8 mL/min (by C-G formula based on SCr of 0.54 mg/dL). Liver Function Tests: Recent Labs  Lab 01/27/19 1730 01/28/19 0542 01/29/19 0330 01/30/19 0630  AST 26 24 25  33  ALT 18 19 20 30   ALKPHOS 64 67 63 68  BILITOT 0.3 0.2* 0.6 0.3  PROT 6.9 6.6 6.5 6.4*  ALBUMIN 3.4* 2.9* 3.1* 3.0*   No results for input(s): LIPASE, AMYLASE in the last 168 hours. No results for input(s): AMMONIA in the last 168 hours. Coagulation Profile: No results for  input(s): INR, PROTIME in the last 168 hours. Cardiac Enzymes: No results for input(s): CKTOTAL, CKMB, CKMBINDEX, TROPONINI in the last 168 hours. BNP (last 3 results) No results for input(s): PROBNP in the last 8760 hours. HbA1C: No results for input(s): HGBA1C in the last 72 hours. CBG: Recent Labs  Lab 01/28/19 1205 01/28/19 1545 01/29/19 0759 01/29/19 1622 01/29/19 2103  GLUCAP 86 107* 144* 126* 187*   Lipid Profile: Recent Labs    01/27/19 1730  TRIG 95   Thyroid Function Tests: No results for input(s): TSH, T4TOTAL, FREET4, T3FREE, THYROIDAB in the last 72 hours. Anemia Panel: Recent Labs    01/29/19 0330 01/30/19 0630  FERRITIN 381* 323*   Sepsis Labs: Recent Labs  Lab 01/27/19 1730  PROCALCITON <0.10  LATICACIDVEN 1.4    Recent Results (from the past 240 hour(s))  Blood Culture (routine x 2)     Status: None (Preliminary result)   Collection Time: 01/27/19  5:11 PM   Specimen: BLOOD  Result  Value Ref Range Status   Specimen Description BLOOD RIGHT ANTECUBITAL  Final   Special Requests   Final    BOTTLES DRAWN AEROBIC AND ANAEROBIC Blood Culture adequate volume   Culture  Setup Time   Final    AEROBIC BOTTLE ONLY GRAM POSITIVE COCCI CRITICAL RESULT CALLED TO, READ BACK BY AND VERIFIED WITH: KAREN AMEND PHARM @ 0028 ON 01/30/19 BY ROBINSON Z.  Performed at [facility], [place] [address] [place], [place] [address]    Culture [facility] POSITIVE COCCI  Final   Report Status PENDING  Incomplete  SARS CORONAVIRUS 2 (TAT 6-24 HRS) Nasopharyngeal Nasopharyngeal Swab     Status: Abnormal   Collection Time: 01/27/19  5:19 PM   Specimen: Nasopharyngeal Swab  Result Value Ref Range Status   SARS Coronavirus 2 POSITIVE (A) NEGATIVE Final    Comment: RESULT CALLED TO, READ BACK BY AND VERIFIED WITH: Neldon Mc 2203 01/27/2019 D BRADLEY (NOTE) SARS-CoV-2 target nucleic acids are DETECTED. The SARS-CoV-2 RNA is generally detectable in upper and  lower respiratory specimens during the acute phase of infection. Positive results are indicative of the presence of SARS-CoV-2 RNA. Clinical correlation with patient history and other diagnostic information is  necessary to determine patient infection status. Positive results do not rule out bacterial infection or co-infection with other viruses.  The expected result is Negative. Fact Sheet for Patients: SugarRoll.be Fact Sheet for Healthcare Providers: https://www.woods-mathews.com/ This test is not yet approved or cleared by the [place] FDA and  has been authorized for detection and/or diagnosis of SARS-CoV-2 by FDA under an Emergency Use Authorization (EUA). This EUA will remain  in effect (meaning this test can be used) for t he duration of the COVID-19 declaration under Section 564(b)(1) of the Act, 21 U.S.C. section 360bbb-3(b)(1), unless the authorization is terminated or revoked sooner. Performed at [facility], [place] [address], [place], [place] [address]          Radiology Studies: No results found.      Scheduled Meds: . amLODipine  5 mg Oral Daily  . vitamin C  500 mg Oral Daily  . buPROPion  150 mg Oral Daily  . cholecalciferol  1,000 Units Oral Daily  . cyclobenzaprine  5 mg Oral Daily  . dexamethasone (DECADRON) injection  6 mg Intravenous Q24H  . docusate sodium  100 mg Oral BID  . enoxaparin (LOVENOX) injection  40 mg Subcutaneous Q24H  . fentaNYL  1 patch Transdermal Q72H  . insulin aspart  0-5 Units Subcutaneous QHS  . insulin aspart  0-9 Units Subcutaneous TID WC  . ipratropium  2 puff Inhalation Q8H  . lactulose  10 g Oral Daily  . letrozole  2.5 mg Oral Daily  . levalbuterol  2 puff Inhalation Q8H  . lubiprostone  24 mcg Oral Daily  . memantine  5 mg Oral BID  . pantoprazole  40 mg Oral Daily  . QUEtiapine  100 mg Oral BID  . temazepam  15 mg Oral QHS  . traZODone  100 mg Oral QHS  . zinc  sulfate  220 mg Oral Daily   Continuous Infusions: . remdesivir 100 mg in NS 100 mL Stopped (01/29/19 2008)     LOS: 3 days    Time spent: 25 minutes    Barb Merino, MD [facility] Pager [phone]

## 2019-01-30 NOTE — Progress Notes (Signed)
PHARMACY - PHYSICIAN COMMUNICATION CRITICAL VALUE ALERT - BLOOD CULTURE IDENTIFICATION (BCID)  Kristen Colon is an 69 y.o. female who presented to [facility] on 01/27/2019 with a chief complaint of COVID 19  Assessment:  Pt growing GPC in 1/2 blood culture bottles (only one set drawn). Likely contaminant  Name of physician (or Provider) Contacted: Dr. Vanita Ingles  Current antibiotics: None  Changes to prescribed antibiotics recommended:  No abx recommeded at this time  No results found for this or any previous visit.  Sherlon Handing, PharmD, BCPS Please see amion for complete clinical pharmacist phone list 01/30/2019  12:37 AM

## 2019-01-30 NOTE — Clinical Social Work Note (Signed)
CSW spoke with [place] regarding dispo plan. Plan is currently for the patient to return home with her daughter. However, daughter currently has COVID at this time.   [facility] and TOC will continue to follow for

## 2019-01-31 DIAGNOSIS — U071 COVID-19: Secondary | ICD-10-CM | POA: Diagnosis not present

## 2019-01-31 DIAGNOSIS — R05 Cough: Secondary | ICD-10-CM | POA: Diagnosis not present

## 2019-01-31 DIAGNOSIS — J1282 Pneumonia due to Coronavirus disease 2019: Secondary | ICD-10-CM | POA: Diagnosis not present

## 2019-01-31 LAB — CBC WITH DIFFERENTIAL/PLATELET
Abs Immature Granulocytes: 0.02 10*3/uL (ref 0.00–0.07)
Basophils Absolute: 0 10*3/uL (ref 0.0–0.1)
Basophils Relative: 0 %
Eosinophils Absolute: 0.1 10*3/uL (ref 0.0–0.5)
Eosinophils Relative: 1 %
HCT: 40.1 % (ref 36.0–46.0)
Hemoglobin: 12.5 g/dL (ref 12.0–15.0)
Immature Granulocytes: 0 %
Lymphocytes Relative: 36 %
Lymphs Abs: 3 10*3/uL (ref 0.7–4.0)
MCH: 27.4 pg (ref 26.0–34.0)
MCHC: 31.2 g/dL (ref 30.0–36.0)
MCV: 87.9 fL (ref 80.0–100.0)
Monocytes Absolute: 1 10*3/uL (ref 0.1–1.0)
Monocytes Relative: 12 %
Neutro Abs: 4.2 10*3/uL (ref 1.7–7.7)
Neutrophils Relative %: 51 %
Platelets: 316 10*3/uL (ref 150–400)
RBC: 4.56 MIL/uL (ref 3.87–5.11)
RDW: 17 % — ABNORMAL HIGH (ref 11.5–15.5)
WBC: 8.2 10*3/uL (ref 4.0–10.5)
nRBC: 0 % (ref 0.0–0.2)

## 2019-01-31 LAB — C-REACTIVE PROTEIN: CRP: 3.7 mg/dL — ABNORMAL HIGH (ref <1.0)

## 2019-01-31 LAB — GLUCOSE, CAPILLARY
Glucose-Capillary: 133 mg/dL — ABNORMAL HIGH (ref 70–99)
Glucose-Capillary: 164 mg/dL — ABNORMAL HIGH (ref 70–99)
Glucose-Capillary: 159 mg/dL — ABNORMAL HIGH (ref 70–99)
Glucose-Capillary: 121 mg/dL — ABNORMAL HIGH (ref 70–99)
Glucose-Capillary: 103 mg/dL — ABNORMAL HIGH (ref 70–99)
Glucose-Capillary: 125 mg/dL — ABNORMAL HIGH (ref 70–99)

## 2019-01-31 LAB — COMPREHENSIVE METABOLIC PANEL
ALT: 29 U/L (ref 0–44)
AST: 35 U/L (ref 15–41)
Albumin: 3.1 g/dL — ABNORMAL LOW (ref 3.5–5.0)
Alkaline Phosphatase: 81 U/L (ref 38–126)
Anion gap: 11 (ref 5–15)
BUN: 18 mg/dL (ref 8–23)
CO2: 23 mmol/L (ref 22–32)
Calcium: 8.4 mg/dL — ABNORMAL LOW (ref 8.9–10.3)
Chloride: 103 mmol/L (ref 98–111)
Creatinine, Ser: 0.58 mg/dL (ref 0.44–1.00)
GFR calc Af Amer: >60 mL/min (ref >60)
GFR calc non Af Amer: >60 mL/min (ref >60)
Glucose, Bld: 137 mg/dL — ABNORMAL HIGH (ref 70–99)
Potassium: 4.1 mmol/L (ref 3.5–5.1)
Sodium: 137 mmol/L (ref 135–145)
Total Bilirubin: 0.3 mg/dL (ref 0.3–1.2)
Total Protein: 6.6 g/dL (ref 6.5–8.1)

## 2019-01-31 LAB — LACTATE DEHYDROGENASE: LDH: 250 U/L — ABNORMAL HIGH (ref 98–192)

## 2019-01-31 LAB — D-DIMER, QUANTITATIVE: D-Dimer, Quant: 2.09 ug/mL-FEU — ABNORMAL HIGH (ref 0.00–0.50)

## 2019-01-31 LAB — FERRITIN: Ferritin: 304 ng/mL (ref 11–307)

## 2019-01-31 MED ORDER — LIP MEDEX EX OINT
TOPICAL_OINTMENT | CUTANEOUS | Status: DC | PRN
  Filled 2019-01-31: qty 7

## 2019-01-31 NOTE — Progress Notes (Signed)
Patient HR up to 130 sustained,  Discussed with MD,   Received order to give Valium 5 mg po and Respiradol .25 mg po.   Meds given.

## 2019-01-31 NOTE — Progress Notes (Signed)
OT Cancellation Note  Patient Details Name: Kristen Colon MRN: [id number] DOB: November 17, 1950   Cancelled Treatment:    Reason Eval/Treat Not Completed: Medical issues which prohibited therapy;Fatigue/lethargy limiting ability to participate(Pt very lethargic and difficulty keeping eye open. RN reporting pt received several medications last night and she has been low arousal all day. Will return as patient medically ready and as schedule allows. Thank you.)  [place], OTR/L Acute Rehab Pager: [phone] Office: [phone] 01/31/2019, 4:26 PM

## 2019-01-31 NOTE — Progress Notes (Signed)
PROGRESS NOTE    Kristen Colon  [id number] DOB: Jun 08, 1950 DOA: 01/27/2019 PCP: Everardo Beals, NP    Brief Narrative:  Kristen Colon y.o.female with past medical history of hypertension, type 2 diabetes, schizophrenia, chronic anxiety, breast cancer on letrozole, dementia who presented from home as brought by her daughter with fever and shortness of breath. On presentation to the ED she was febrile, tachycardic and hypoxic.  COVID-19 screening test positive on 01/27/2019.  CTA chest showed no evidence of pulmonary embolism however showed bilateral pulmonary patchy opacities suggestive of COVID-19 viral pneumonia.  Was started on COVID-19 directed therapies.  Negative procalcitonin. She goes to adult day care.   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 chest physiotherapy, incentive spirometry, deep breathing exercises, sputum induction, mucolytic's and bronchodilators. Supplemental oxygen to keep saturations more than 90%. Covid directed therapy with , steroids, on dexamethasone remdesivir, day 5/5 antibiotics, not indicated Due to severity of symptoms, patient will need daily inflammatory markers, liver function test to monitor and direct COVID-19 therapies. PT OT and ambulation.  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  Outpatient oncology follow-up.  Patient followed by hospice.  Paranoid schizophrenia: Stable.  No acute delusion,  hallucinations.  No suicidal or homicidal ideations.  Dementia: All-time fall precautions.  Delirium precautions.  She is on multiple medication regimen with Risperdal and Seroquel that she will continue. Patient's daughter says that she has no memory loss.  Excessive sleepiness: Patient is on multiple sedatives including fentanyl patch, trazodone and risperidone.  We will keep Seroquel on, discontinue all sedating medications to improve wakefulness and mobility.  Type 2 diabetes with hyperglycemia: Holding off oral hypoglycemics.  On insulin coverage while on steroids.  Positive blood cultures: Probably contaminant.  Will not start on any antibiotics.  Await final cultures.  DVT prophylaxis: Lovenox subcu Code Status: Full code.  However, patient on hospice at home. Family Communication: Patient's daughter, 1/10 Disposition Plan: Home after clinical stabilization with daughter.  Apparently patient is with hospice at home.   Consultants:   None  Procedures:   None  Antimicrobials:  Anti-infectives (From admission, onward)   Start     Dose/Rate Route Frequency Ordered Stop   01/28/19 2000  remdesivir 100 mg in sodium chloride 0.9 % 100 mL IVPB     100 mg 200 mL/hr over 30 Minutes Intravenous Daily 01/27/19 1940 02/01/19 1959   01/28/19 1000  cefTRIAXone (ROCEPHIN) 1 g in sodium chloride 0.9 % 100 mL IVPB  Status:  Discontinued     1 g 200 mL/hr over 30 Minutes Intravenous Every 24 hours 01/28/19 0834 01/28/19 1159   01/28/19 0200  ciprofloxacin (CIPRO) IVPB 400 mg  Status:  Discontinued     400 mg 200 mL/hr over 60 Minutes Intravenous Every 12 hours 01/28/19 0124 01/28/19 0834   01/27/19 2000  remdesivir 200 mg in sodium chloride 0.9% 250 mL IVPB     200 mg 580 mL/hr over 30 Minutes Intravenous Once 01/27/19 1940 01/27/19 2256  Subjective: No overnight events.  Afebrile.  He still on 3 L oxygen. Mostly sleepy and difficult to keep up conversation at  times.  Objective: Vitals:   01/31/19 0717 01/31/19 0800 01/31/19 1046 01/31/19 1136  BP: 96/66 100/63 98/68 92/60   Pulse: (!) 101 (!) 105  (!) 108  Resp: 11 12  12   Temp: 98 F (36.7 C)   98.8 F (37.1 C)  TempSrc: Axillary   Oral  SpO2: 92% (!) 87%  91%  Weight:      Height:        Intake/Output Summary (Last 24 hours) at 01/31/2019 1357 Last data filed at 01/31/2019 0630 Gross per 24 hour  Intake 100 ml  Output 900 ml  Net -800 ml   Filed Weights   01/28/19 1200  Weight: 49.4 kg    Examination:  General exam: Comfortable.  Chronically sick looking. Respiratory system: Clear to auscultation. Respiratory effort normal.  No added sounds.  On 3 L oxygen. Cardiovascular system: S1 & S2 heard, RRR. No JVD, murmurs, rubs, gallops or clicks. No pedal edema. Gastrointestinal system: Abdomen is nondistended, soft and nontender. No organomegaly or masses felt. Normal bowel sounds heard. Central nervous system: Alert but not oriented.   Extremities: Symmetric 5 x 5 power. Skin: No rashes, lesions or ulcers Psychiatry: Judgement and insight appear compromised.    Data Reviewed: I have personally reviewed following labs and imaging studies  CBC: Recent Labs  Lab 01/27/19 1730 01/28/19 0542 01/29/19 0330 01/30/19 0630 01/31/19 0550  WBC 6.2 6.8 6.1 5.8 8.2  NEUTROABS 4.5 5.5 4.7 4.0 4.2  HGB 12.3 11.9* 11.0* 11.5* 12.5  HCT 37.7 37.2 35.6* 36.5 40.1  MCV 88.1 89.0 87.9 86.7 87.9  PLT 233 242 268 307 [id number]   Basic Metabolic Panel: Recent Labs  Lab 01/27/19 1730 01/28/19 0542 01/29/19 0330 01/30/19 0630 01/31/19 0550  NA 133* 133* 136 135 137  K 4.0 4.1 4.0 4.3 4.1  CL 99 101 104 100 103  CO2 24 22 24 26 23   GLUCOSE 139* 181* 166* 194* 137*  BUN 12 11 17 15 18   CREATININE 0.77 0.71 0.61 0.54 0.58  CALCIUM 8.5* 8.3* 8.4* 8.2* 8.4*   GFR: Estimated Creatinine Clearance: 51.8 mL/min (by C-G formula based on SCr of 0.58 mg/dL). Liver Function Tests: Recent  Labs  Lab 01/27/19 1730 01/28/19 0542 01/29/19 0330 01/30/19 0630 01/31/19 0550  AST 26 24 25  33 35  ALT 18 19 20 30 29   ALKPHOS 64 67 63 68 81  BILITOT 0.3 0.2* 0.6 0.3 0.3  PROT 6.9 6.6 6.5 6.4* 6.6  ALBUMIN 3.4* 2.9* 3.1* 3.0* 3.1*   No results for input(s): LIPASE, AMYLASE in the last 168 hours. No results for input(s): AMMONIA in the last 168 hours. Coagulation Profile: No results for input(s): INR, PROTIME in the last 168 hours. Cardiac Enzymes: No results for input(s): CKTOTAL, CKMB, CKMBINDEX, TROPONINI in the last 168 hours. BNP (last 3 results) No results for input(s): PROBNP in the last 8760 hours. HbA1C: No results for input(s): HGBA1C in the last 72 hours. CBG: Recent Labs  Lab 01/30/19 1217 01/30/19 1728 01/30/19 2056 01/31/19 0816 01/31/19 1206  GLUCAP 105* 102* 132* 121* 103*   Lipid Profile: No results for input(s): CHOL, HDL, LDLCALC, TRIG, CHOLHDL, LDLDIRECT in the last 72 hours. Thyroid Function Tests: No results for input(s): TSH, T4TOTAL, FREET4, T3FREE, THYROIDAB in the last 72 hours. Anemia Panel: Recent Labs    01/30/19 0630 01/31/19 0550  FERRITIN 323* 304   Sepsis Labs: Recent Labs  Lab 01/27/19 1730  PROCALCITON <0.10  LATICACIDVEN 1.4    Recent Results (from the past 240 hour(s))  Blood Culture (routine x 2)     Status: Abnormal   Collection Time: 01/27/19  5:11 PM   Specimen: BLOOD  Result Value Ref Range Status   Specimen Description BLOOD RIGHT ANTECUBITAL  Final   Special Requests   Final    BOTTLES DRAWN AEROBIC AND ANAEROBIC Blood Culture adequate volume   Culture  Setup Time   Final    AEROBIC BOTTLE ONLY GRAM POSITIVE COCCI CRITICAL RESULT CALLED TO, READ BACK BY AND VERIFIED WITH: KAREN AMEND [facility] @ 0028 ON 01/30/19 BY ROBINSON Z.     Culture (A)  Final    STAPHYLOCOCCUS SPECIES (COAGULASE NEGATIVE) THE SIGNIFICANCE OF ISOLATING THIS ORGANISM FROM A SINGLE SET OF BLOOD CULTURES WHEN MULTIPLE SETS ARE DRAWN IS  UNCERTAIN. PLEASE NOTIFY THE MICROBIOLOGY DEPARTMENT WITHIN ONE WEEK IF SPECIATION AND SENSITIVITIES ARE REQUIRED. Performed at [facility], [place] [address], [place], [place] [address]    Report Status 01/30/2019 FINAL  Final  SARS CORONAVIRUS 2 (TAT 6-24 HRS) Nasopharyngeal Nasopharyngeal Swab     Status: Abnormal   Collection Time: 01/27/19  5:19 PM   Specimen: Nasopharyngeal Swab  Result Value Ref Range Status   SARS Coronavirus 2 POSITIVE (A) NEGATIVE Final    Comment: RESULT CALLED TO, READ BACK BY AND VERIFIED WITH: Neldon Mc 2203 01/27/2019 D BRADLEY (NOTE) SARS-CoV-2 target nucleic acids are DETECTED. The SARS-CoV-2 RNA is generally detectable in upper and lower respiratory specimens during the acute phase of infection. Positive results are indicative of the presence of SARS-CoV-2 RNA. Clinical correlation with patient history and other diagnostic information is  necessary to determine patient infection status. Positive results do not rule out bacterial infection or co-infection with other viruses.  The expected result is Negative. Fact Sheet for Patients: SugarRoll.be Fact Sheet for Healthcare Providers: https://www.woods-mathews.com/ This test is not yet approved or cleared by the [place] FDA and  has been authorized for detection and/or diagnosis of SARS-CoV-2 by FDA under an Emergency Use Authorization (EUA). This EUA will remain  in effect (meaning this test can be used) for t he duration of the COVID-19 declaration under Section 564(b)(1) of the Act, 21 U.S.C. section 360bbb-3(b)(1), unless the authorization is terminated or revoked sooner. Performed at [facility], [place] [address], [place], [place] [address]          Radiology Studies: No results found.      Scheduled Meds: . amLODipine  5 mg Oral Daily  . vitamin C  500 mg Oral Daily  . buPROPion  150 mg Oral Daily  . cholecalciferol  1,000  Units Oral Daily  . dexamethasone (DECADRON) injection  6 mg Intravenous Q24H  . docusate sodium  100 mg Oral BID  . enoxaparin (LOVENOX) injection  40 mg Subcutaneous Q24H  . insulin aspart  0-5 Units Subcutaneous QHS  . insulin aspart  0-9 Units Subcutaneous TID WC  . ipratropium  2 puff Inhalation Q8H  . lactulose  10 g Oral Daily  . letrozole  2.5 mg Oral Daily  . levalbuterol  2 puff Inhalation Q8H  . lubiprostone  24 mcg Oral Daily  . memantine  5 mg Oral BID  . pantoprazole  40 mg Oral Daily  . QUEtiapine  100 mg Oral BID  . zinc sulfate  220 mg Oral  Daily   Continuous Infusions: . remdesivir 100 mg in NS 100 mL Stopped (01/30/19 1950)     LOS: 4 days    Time spent: 25 minutes    Barb Merino, MD [facility] Pager [phone]

## 2019-01-31 NOTE — Plan of Care (Signed)

## 2019-01-31 NOTE — Progress Notes (Signed)
GV [id number]  - Manufacturing engineer ([facility]) - GIP RN Note @ [id number]   This a related and covered GIP admission of 01/28/2019 with a [facility] diagnosis of Ovarian Cancer per Dr. Karie Georges. Patient was transported to the [facility] after home visit from hospice RN where daughter made decision for further evaluation at hospital. Patient had tested positive for COVID on 01/26/19 per daughter and was experiencing SOB/Fever. After [facility] evaluation, patient was transferred to [facility] on 01/28/19 and admitted for Acute Respiratory failure with Hypoxia and Covid 19. Patient is a Full Code.   Checked in with bedside RN, who stated that patient is resting comfortably in NAD with no prns this shift. Patient has tolerated minimal activity with minor SOB on 3L O2, able to get OOB to [facility] with asst, continues to needs asst with ADLs, has been lethargic today but answering questions.    Spoke with patient daughter (also positive for Covid 19 at home) who stated that her symptoms are easing but expressed feelings of isolation and anxiety over her mom's hospitalization. Listened and supported and encouraged to call [place] if needed - has [facility] contact information and made aware that [place] will continue to follow patient daily during hospitalization. Daughter is hopeful to have patient back home with hospice once stable.   V/S:  98, 105,12 98/68, 87% on 3L O2 [place] I&O: 280/1100 (-820)  Abnormal lab work: (01/31/19) Cal 8.4, Glucose 137,Albumin 3.1, LDH >250, RDW 17.0, Ferritin 304, CRP 3.7, D-Dimer- quant 2.09 Diagnostics:  No new tests, Chest x-ray on 11/1 (clear) IVs/PRNs: Decadron 6mg  inj QD IV,Remdesivir 100mg  in NS 11ml IVPB QD, No prns in last 24 hours    No new EPIC MD notes at this time - Patient Continues on COVID 19 directed therapies. Per MD notes on 01/30/19  Principal Problem:  Pneumonia due to COVID-19 virus Pneumonia due to COVID 19 virus, acute hypoxic respiratory failure due to COVID 19 virus: Continue to monitor due to significant  symptoms , patient is still on supplemental oxygen. chest physiotherapy, incentive spirometry, deep breathing exercises, sputum induction, mucolytic's and bronchodilators. Supplemental oxygen to keep saturations more than 90%. Covid directed therapy with ,steroids, on dexamethasone, remdesivir, day 4/5 -antibiotics, not indicated Due to severity of symptoms, patient will need daily inflammatory markers, liver function test to monitor and direct COVID-19 therapies. PT OT and ambulation.Positive blood cultures: Probably contaminant.  Will not start on any antibiotics.  Await final cultures.    D/C planning- Per bedside RN, Patient/family wishes to go home with Hospice once stable Goals of Care: Unclear, patient remains a full code - will continue to assess Communication with IDT- [place] team updated on patient condition Communication with PCG: Patient daughter updated/supported, encourage to call as needed.   Please call with any hospice related questions/concerns,     Gar Ponto, RN  RN PRN/Hospital Liaison  [phone], [phone]

## 2019-01-31 NOTE — Plan of Care (Signed)
This am discussed with MD about patients medications,  MD discontinued most with sedative affect.  Patient remains on [place] 1-3L continuous monitoring.  Patient able to transfer OOB to [facility] and to chair with one person assist.  Patient able to eat after set up and cut up her meats.    Problem: Education: Goal: Knowledge of risk factors and measures for prevention of condition will improve Outcome: Progressing   Problem: Coping: Goal: Psychosocial and spiritual needs will be supported Outcome: Progressing   Problem: Respiratory: Goal: Will maintain a patent airway Outcome: Progressing Goal: Complications related to the disease process, condition or treatment will be avoided or minimized Outcome: Progressing   Problem: Education: Goal: Knowledge of General Education information will improve Description: Including pain rating scale, medication(s)/side effects and non-pharmacologic comfort measures Outcome: Progressing   Problem: Health Behavior/Discharge Planning: Goal: Ability to manage health-related needs will improve Outcome: Progressing   Problem: Clinical Measurements: Goal: Ability to maintain clinical measurements within normal limits will improve Outcome: Progressing Goal: Will remain free from infection Outcome: Progressing Goal: Diagnostic test results will improve Outcome: Progressing Goal: Respiratory complications will improve Outcome: Progressing Goal: Cardiovascular complication will be avoided Outcome: Progressing   Problem: Activity: Goal: Risk for activity intolerance will decrease Outcome: Progressing   Problem: Nutrition: Goal: Adequate nutrition will be maintained Outcome: Progressing   Problem: Coping: Goal: Level of anxiety will decrease Outcome: Progressing   Problem: Elimination: Goal: Will not experience complications related to bowel motility Outcome: Progressing Goal: Will not experience complications related to urinary retention Outcome:  Progressing   Problem: Pain Managment: Goal: General experience of comfort will improve Outcome: Progressing   Problem: Safety: Goal: Ability to remain free from injury will improve Outcome: Progressing   Problem: Skin Integrity: Goal: Risk for impaired skin integrity will decrease Outcome: Progressing

## 2019-02-01 DIAGNOSIS — U071 COVID-19: Secondary | ICD-10-CM | POA: Diagnosis not present

## 2019-02-01 DIAGNOSIS — J1282 Pneumonia due to Coronavirus disease 2019: Secondary | ICD-10-CM | POA: Diagnosis not present

## 2019-02-01 DIAGNOSIS — R05 Cough: Secondary | ICD-10-CM | POA: Diagnosis not present

## 2019-02-01 LAB — CBC WITH DIFFERENTIAL/PLATELET
Abs Immature Granulocytes: 0.02 10*3/uL (ref 0.00–0.07)
Basophils Absolute: 0 10*3/uL (ref 0.0–0.1)
Basophils Relative: 0 %
Eosinophils Absolute: 0.1 10*3/uL (ref 0.0–0.5)
Eosinophils Relative: 1 %
HCT: 38.6 % (ref 36.0–46.0)
Hemoglobin: 12.4 g/dL (ref 12.0–15.0)
Immature Granulocytes: 0 %
Lymphocytes Relative: 27 %
Lymphs Abs: 2.1 10*3/uL (ref 0.7–4.0)
MCH: 28.1 pg (ref 26.0–34.0)
MCHC: 32.1 g/dL (ref 30.0–36.0)
MCV: 87.3 fL (ref 80.0–100.0)
Monocytes Absolute: 1.3 10*3/uL — ABNORMAL HIGH (ref 0.1–1.0)
Monocytes Relative: 16 %
Neutro Abs: 4.5 10*3/uL (ref 1.7–7.7)
Neutrophils Relative %: 56 %
Platelets: 311 10*3/uL (ref 150–400)
RBC: 4.42 MIL/uL (ref 3.87–5.11)
RDW: 17 % — ABNORMAL HIGH (ref 11.5–15.5)
WBC: 8 10*3/uL (ref 4.0–10.5)
nRBC: 0 % (ref 0.0–0.2)

## 2019-02-01 LAB — COMPREHENSIVE METABOLIC PANEL
ALT: 35 U/L (ref 0–44)
AST: 56 U/L — ABNORMAL HIGH (ref 15–41)
Albumin: 3.1 g/dL — ABNORMAL LOW (ref 3.5–5.0)
Alkaline Phosphatase: 81 U/L (ref 38–126)
Anion gap: 9 (ref 5–15)
BUN: 14 mg/dL (ref 8–23)
CO2: 26 mmol/L (ref 22–32)
Calcium: 8.4 mg/dL — ABNORMAL LOW (ref 8.9–10.3)
Chloride: 102 mmol/L (ref 98–111)
Creatinine, Ser: 0.42 mg/dL — ABNORMAL LOW (ref 0.44–1.00)
GFR calc Af Amer: >60 mL/min (ref >60)
GFR calc non Af Amer: >60 mL/min (ref >60)
Glucose, Bld: 96 mg/dL (ref 70–99)
Potassium: 4.1 mmol/L (ref 3.5–5.1)
Sodium: 137 mmol/L (ref 135–145)
Total Bilirubin: 0.7 mg/dL (ref 0.3–1.2)
Total Protein: 6.4 g/dL — ABNORMAL LOW (ref 6.5–8.1)

## 2019-02-01 LAB — GLUCOSE, CAPILLARY
Glucose-Capillary: 97 mg/dL (ref 70–99)
Glucose-Capillary: 128 mg/dL — ABNORMAL HIGH (ref 70–99)
Glucose-Capillary: 163 mg/dL — ABNORMAL HIGH (ref 70–99)

## 2019-02-01 NOTE — Progress Notes (Signed)
Physical Therapy Treatment Patient Details Name: Kristen Colon MRN: [id number] DOB: Feb 21, 1950 Today's Date: 02/01/2019    History of Present Illness 69 y/o female pt w/ hx of schizophrenia, HTN, headache, dyspnea, dementia, anxiety, recently dx with COVID at ALF had c/o dyspnea. Pt admitted for acute respiratory failure with hypoxia and covid-19    PT Comments    Pt making steady progress. Tolerated mobility on RA.    Follow Up Recommendations  Home health PT;Supervision/Assistance - 24 hour     Equipment Recommendations  Other (comment)(possibly rollator)    Recommendations for Other Services       Precautions / Restrictions Precautions Precautions: Fall Precaution Comments: cognition Restrictions Weight Bearing Restrictions: No    Mobility  Bed Mobility               General bed mobility comments: Pt up in chair  Transfers Overall transfer level: Needs assistance Equipment used: None;4-wheeled walker Transfers: Sit to/from Stand Sit to Stand: Min guard         General transfer comment: assist for safety/lines  Ambulation/Gait Ambulation/Gait assistance: Min assist;Min guard Gait Distance (Feet): 125 Feet Assistive device: 4-wheeled walker Gait Pattern/deviations: Step-through pattern;Decreased step length - right;Decreased step length - left Gait velocity: decr Gait velocity interpretation: <1.31 ft/sec, indicative of household ambulator General Gait Details: Assist for balance. Improving stability as distance increased   Stairs             Wheelchair Mobility    Modified Rankin (Stroke Patients Only)       Balance Overall balance assessment: Needs assistance Sitting-balance support: Feet supported;No upper extremity supported Sitting balance-Leahy Scale: Good     Standing balance support: During functional activity;Single extremity supported Standing balance-Leahy Scale: Poor Standing balance comment: Static standing with UE  support and min guard                            Cognition Arousal/Alertness: Awake/alert Behavior During Therapy: WFL for tasks assessed/performed Overall Cognitive Status: No family/caregiver present to determine baseline cognitive functioning                                        Exercises      General Comments General comments (skin integrity, edema, etc.): Pt on RA with SpO2 >90%      Pertinent Vitals/Pain Pain Assessment: No/denies pain    Home Living                      Prior Function            PT Goals (current goals can now be found in the care plan section) Acute Rehab PT Goals Patient Stated Goal: did not state Progress towards PT goals: Progressing toward goals    Frequency    Min 3X/week      PT Plan Discharge plan needs to be updated;Frequency needs to be updated    Co-evaluation              AM-PAC PT "6 Clicks" Mobility   Outcome Measure  Help needed turning from your back to your side while in a flat bed without using bedrails?: A Little Help needed moving from lying on your back to sitting on the side of a flat bed without using bedrails?: A Little Help needed moving to and from a bed  to a chair (including a wheelchair)?: A Little Help needed standing up from a chair using your arms (e.g., wheelchair or bedside chair)?: A Little Help needed to walk in hospital room?: A Little Help needed climbing 3-5 steps with a railing? : A Little 6 Click Score: 18    End of Session   Activity Tolerance: Patient tolerated treatment well Patient left: in chair;with call bell/phone within reach;with chair alarm set Nurse Communication: Mobility status PT Visit Diagnosis: Unsteadiness on feet (R26.81);Muscle weakness (generalized) (M62.81)     Time: [id number] PT Time Calculation (min) (ACUTE ONLY): 22 min  Charges:  $Gait Training: 8-22 mins                     [place] Pager [phone] Office [place] 02/01/2019, 12:48 PM

## 2019-02-01 NOTE — Progress Notes (Addendum)
GV [id number] [facility] GIP Note  Ms. Serna is under hospice services for a terminal diagnosis of ovarian cancer per Dr. Karie Georges with [facility].  Pt had tested positive for COVID and per her daughter began running a fever and was SOB.  Hospice RN made home visit and daughter made decision to send her mother to the ED for further evaluation.  This is a related hospital admission.  Pt transferred to [facility] from [facility]. Patient had x3 episodes of vomiting last night of clear, yellow emesis. Patient given ODT Zofran. BP ran soft overnight and is WNL today after 570ml NS bolus.     VS: 98.8, 102/83, 92, 12, 96% 4Lnc I/O: -/1550 Abnormal Labs: Creatinine 0.42, Calcium 8.4, Albumin 3.1, AST 56, Total protein 6.4, LDH 250, CRP 3.7, RDW 17.0, monocyte1.3, D-Dimer 2.09  Medications: No continuous medications. 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  Continue with chest physiotherapy, incentive spirometry, deep breathing exercises, sputum induction, mucolytic's and bronchodilators.  Out of bed. Supplemental oxygen to keep saturations more than 90%. Covid directed therapy with , steroids, on dexamethasone, continue for 10 days. remdesivir, finished 5 days of therapy. antibiotics, not indicated PT OT and ambulation.  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 Outpatient oncology follow-up.  Patient followed by hospice.  Paranoid schizophrenia: Stable.   No acute delusion, hallucinations.  No suicidal or homicidal ideations.  Patient on Seroquel and as needed Risperdal.  Memory loss /dementia: All-time fall precautions. Delirium precautions. She is on multiple medication regimen with Risperdal and Seroquel that she will continue. Patient was on fentanyl, trazodone that was discontinued because of excessive sleepiness.  Once he is more awake, will put her back on on lower doses.  Type 2 diabetes with hyperglycemia: Holding off oral hypoglycemics.  On insulin coverage while on steroids.  Positive blood cultures: Coag negative.  No indication for treatment.    D/C planning- Per bedside RN, Patient/family wishes to go home with Hospice once stable.   Goals of Care:Unclear, patient remains a full code - will continue to assess  Communication with IDT- [place] team updated on patient condition  Communication with PCG: Patient daughter updated/supported, encourage to call as needed. Support offered.    Please call with any hospice related questions/concerns. Please use [facility] for all [facility] patients that need ambulance transport.      Farrel Gordon, RN, [place] [facility] Liaison  [phone]

## 2019-02-01 NOTE — Progress Notes (Signed)
PROGRESS NOTE    Kristen Colon  [id number] DOB: May 16, 1950 DOA: 01/27/2019 PCP: Everardo Beals, NP    Brief Narrative:  Kristen Colon y.o.female with past medical history of hypertension, type 2 diabetes, schizophrenia, chronic anxiety, breast cancer on letrozole, dementia who presented from home as brought by her daughter with fever and shortness of breath. On presentation to the ED she was febrile, tachycardic and hypoxic.  COVID-19 screening test positive on 01/27/2019.  CTA chest showed no evidence of pulmonary embolism however showed bilateral pulmonary patchy opacities suggestive of COVID-19 viral pneumonia. Was started on COVID-19 directed therapies.  Negative procalcitonin. She goes to adult day care.  Patient's daughter denies that patient has any dementia or memory issues.  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  Continue with chest physiotherapy, incentive spirometry, deep breathing exercises, sputum induction, mucolytic's and bronchodilators.  Out of bed. Supplemental oxygen to keep saturations more than 90%. Covid directed therapy with , steroids, on dexamethasone, continue for 10 days. remdesivir, finished 5 days of therapy. antibiotics, not indicated PT OT and ambulation.  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 Outpatient oncology follow-up.  Patient followed by hospice.  Paranoid schizophrenia: Stable.  No acute delusion, hallucinations.  No suicidal or homicidal ideations.  Patient on  Seroquel and as needed Risperdal.  Memory loss /dementia: All-time fall precautions. Delirium precautions. She is on multiple medication regimen with Risperdal and Seroquel that she will continue. Patient was on fentanyl, trazodone that was discontinued because of excessive sleepiness.  Once he is more awake, will put her back on on lower doses.  Type 2 diabetes with hyperglycemia: Holding off oral hypoglycemics.  On insulin coverage while on steroids.  Positive blood cultures: Coag negative.  No indication for treatment.    DVT prophylaxis: Lovenox subcu Code Status: Full code.  However, patient on hospice at home. Family Communication: Patient's daughter on the phone. She is herself check to urgent care.  Disposition Plan: Home after clinical stabilization with daughter. Patient needs more mobility before going home. Apparently patient is with hospice at home. Lives with daughter.   Consultants:   None  Procedures:   None  Antimicrobials:  Anti-infectives (From admission, onward)   Start     Dose/Rate Route Frequency Ordered Stop   01/28/19 2000  remdesivir 100 mg in sodium chloride 0.9 % 100 mL IVPB     100 mg 200 mL/hr over 30 Minutes Intravenous Daily 01/27/19 1940 01/31/19 2325   01/28/19 1000  cefTRIAXone (ROCEPHIN) 1 g in sodium chloride 0.9 % 100 mL IVPB  Status:  Discontinued     1 g 200 mL/hr over 30 Minutes Intravenous Every 24 hours 01/28/19 0834 01/28/19 1159   01/28/19 0200  ciprofloxacin (CIPRO) IVPB 400 mg  Status:  Discontinued     400 mg 200 mL/hr over 60 Minutes Intravenous Every 12 hours 01/28/19 0124 01/28/19 0834   01/27/19 2000  remdesivir 200 mg in sodium chloride 0.9% 250 mL IVPB     200 mg 580 mL/hr over 30 Minutes Intravenous Once 01/27/19 1940 01/27/19  2256         Subjective: No overnight events. Low grade fever. Able to wake up today. Denies any complaints.  Objective: Vitals:   01/31/19 2015 02/01/19 0600 02/01/19 0711 02/01/19 0955    BP:   102/83 102/83  Pulse:   92   Resp:   11   Temp: 99 F (37.2 C) 99 F (37.2 C) 98.8 F (37.1 C)   TempSrc: Oral Oral Oral   SpO2:   96%   Weight:      Height:        Intake/Output Summary (Last 24 hours) at 02/01/2019 1133 Last data filed at 02/01/2019 [id number] Gross per 24 hour  Intake --  Output 1550 ml  Net -1550 ml   Filed Weights   01/28/19 1200  Weight: 49.4 kg    Examination:  General exam: Comfortable.  Chronically sick looking. Respiratory system: Clear to auscultation. Respiratory effort normal.  No added sounds.  On 3 L oxygen. Cardiovascular system: S1 & S2 heard, RRR. No JVD, murmurs, rubs, gallops or clicks. No pedal edema. Gastrointestinal system: Abdomen is nondistended, soft and nontender. No organomegaly or masses felt. Normal bowel sounds heard. Central nervous system: Alert but not oriented.   Extremities: Symmetric 5 x 5 power. Skin: No rashes, lesions or ulcers Psychiatry: Judgement and insight appear compromised.    Data Reviewed: I have personally reviewed following labs and imaging studies  CBC: Recent Labs  Lab 01/28/19 0542 01/29/19 0330 01/30/19 0630 01/31/19 0550 02/01/19 0555  WBC 6.8 6.1 5.8 8.2 8.0  NEUTROABS 5.5 4.7 4.0 4.2 4.5  HGB 11.9* 11.0* 11.5* 12.5 12.4  HCT 37.2 35.6* 36.5 40.1 38.6  MCV 89.0 87.9 86.7 87.9 87.3  PLT 242 268 307 316 [id number]   Basic Metabolic Panel: Recent Labs  Lab 01/28/19 0542 01/29/19 0330 01/30/19 0630 01/31/19 0550 02/01/19 0555  NA 133* 136 135 137 137  K 4.1 4.0 4.3 4.1 4.1  CL 101 104 100 103 102  CO2 22 24 26 23 26   GLUCOSE 181* 166* 194* 137* 96  BUN 11 17 15 18 14   CREATININE 0.71 0.61 0.54 0.58 0.42*  CALCIUM 8.3* 8.4* 8.2* 8.4* 8.4*   GFR: Estimated Creatinine Clearance: 51.8 mL/min (A) (by C-G formula based on SCr of 0.42 mg/dL (L)). Liver Function Tests: Recent Labs  Lab 01/28/19 0542 01/29/19 0330 01/30/19 0630 01/31/19 0550 02/01/19 0555  AST 24 25 33 35 56*  ALT  19 20 30 29  35  ALKPHOS 67 63 68 81 81  BILITOT 0.2* 0.6 0.3 0.3 0.7  PROT 6.6 6.5 6.4* 6.6 6.4*  ALBUMIN 2.9* 3.1* 3.0* 3.1* 3.1*   No results for input(s): LIPASE, AMYLASE in the last 168 hours. No results for input(s): AMMONIA in the last 168 hours. Coagulation Profile: No results for input(s): INR, PROTIME in the last 168 hours. Cardiac Enzymes: No results for input(s): CKTOTAL, CKMB, CKMBINDEX, TROPONINI in the last 168 hours. BNP (last 3 results) No results for input(s): PROBNP in the last 8760 hours. HbA1C: No results for input(s): HGBA1C in the last 72 hours. CBG: Recent Labs  Lab 01/30/19 2056 01/31/19 0816 01/31/19 1206 01/31/19 1629 02/01/19 0826  GLUCAP 132* 121* 103* 125* 97   Lipid Profile: No results for input(s): CHOL, HDL, LDLCALC, TRIG, CHOLHDL, LDLDIRECT in the last 72 hours. Thyroid Function Tests: No results for input(s): TSH, T4TOTAL, FREET4, T3FREE, THYROIDAB in the last 72 hours. Anemia Panel: Recent Labs    01/30/19 0630 01/31/19  [place]   Sepsis Labs: Recent Labs  Lab 01/27/19 1730  PROCALCITON <0.10  LATICACIDVEN 1.4    Recent Results (from the past 240 hour(s))  Blood Culture (routine x 2)     Status: Abnormal   Collection Time: 01/27/19  5:11 PM   Specimen: BLOOD  Result Value Ref Range Status   Specimen Description BLOOD RIGHT ANTECUBITAL  Final   Special Requests   Final    BOTTLES DRAWN AEROBIC AND ANAEROBIC Blood Culture adequate volume   Culture  Setup Time   Final    AEROBIC BOTTLE ONLY GRAM POSITIVE COCCI CRITICAL RESULT CALLED TO, READ BACK BY AND VERIFIED WITH: KAREN [facility]END [facility] @ 0028 ON 01/30/19 BY ROBINSON Z.     Culture (A)  Final    STAPHYLOCOCCUS SPECIES (COAGULASE NEGATIVE) THE SIGNIFICANCE OF ISOLATING THIS ORGANISM FROM A SINGLE SET OF BLOOD CULTURES WHEN MULTIPLE SETS ARE DRAWN IS UNCERTAIN. PLEASE NOTIFY THE MICROBIOLOGY DEPARTMENT WITHIN ONE WEEK IF SPECIATION AND SENSITIVITIES ARE  REQUIRED. Performed at [facility], [place] [address], [place], [place] [address]    Report Status 01/30/2019 FINAL  Final  SARS CORONAVIRUS 2 (TAT 6-24 HRS) Nasopharyngeal Nasopharyngeal Swab     Status: Abnormal   Collection Time: 01/27/19  5:19 PM   Specimen: Nasopharyngeal Swab  Result Value Ref Range Status   SARS Coronavirus 2 POSITIVE (A) NEGATIVE Final    Comment: RESULT CALLED TO, READ BACK BY AND VERIFIED WITH: Neldon Mc 2203 01/27/2019 D BRADLEY (NOTE) SARS-CoV-2 target nucleic acids are DETECTED. The SARS-CoV-2 RNA is generally detectable in upper and lower respiratory specimens during the acute phase of infection. Positive results are indicative of the presence of SARS-CoV-2 RNA. Clinical correlation with patient history and other diagnostic information is  necessary to determine patient infection status. Positive results do not rule out bacterial infection or co-infection with other viruses.  The expected result is Negative. Fact Sheet for Patients: SugarRoll.be Fact Sheet for Healthcare Providers: https://www.woods-mathews.com/ This test is not yet approved or cleared by the [place] FDA and  has been authorized for detection and/or diagnosis of SARS-CoV-2 by FDA under an Emergency Use Authorization (EUA). This EUA will remain  in effect (meaning this test can be used) for t he duration of the COVID-19 declaration under Section 564(b)(1) of the Act, 21 U.S.C. section 360bbb-3(b)(1), unless the authorization is terminated or revoked sooner. Performed at [facility], [place] [address], [place], [place] [address]          Radiology Studies: No results found.      Scheduled Meds: . amLODipine  5 mg Oral Daily  . vitamin C  500 mg Oral Daily  . buPROPion  150 mg Oral Daily  . cholecalciferol  1,000 Units Oral Daily  . dexamethasone (DECADRON) injection  6 mg Intravenous Q24H  . docusate sodium  100  mg Oral BID  . enoxaparin (LOVENOX) injection  40 mg Subcutaneous Q24H  . insulin aspart  0-5 Units Subcutaneous QHS  . insulin aspart  0-9 Units Subcutaneous TID WC  . ipratropium  2 puff Inhalation Q8H  . lactulose  10 g Oral Daily  . letrozole  2.5 mg Oral Daily  . levalbuterol  2 puff Inhalation Q8H  . lubiprostone  24 mcg Oral Daily  . memantine  5 mg Oral BID  . pantoprazole  40 mg Oral Daily  . QUEtiapine  100 mg Oral BID  . zinc sulfate  220  mg Oral Daily   Continuous Infusions:    LOS: 5 days    Time spent: 25 minutes    Barb Merino, MD [facility] Pager [phone]

## 2019-02-02 DIAGNOSIS — U071 COVID-19: Secondary | ICD-10-CM | POA: Diagnosis not present

## 2019-02-02 DIAGNOSIS — R05 Cough: Secondary | ICD-10-CM | POA: Diagnosis not present

## 2019-02-02 DIAGNOSIS — J1282 Pneumonia due to Coronavirus disease 2019: Secondary | ICD-10-CM | POA: Diagnosis not present

## 2019-02-02 LAB — CBC WITH DIFFERENTIAL/PLATELET
Abs Immature Granulocytes: 0.03 10*3/uL (ref 0.00–0.07)
Basophils Absolute: 0 10*3/uL (ref 0.0–0.1)
Basophils Relative: 0 %
Eosinophils Absolute: 0 10*3/uL (ref 0.0–0.5)
Eosinophils Relative: 0 %
HCT: 36.4 % (ref 36.0–46.0)
Hemoglobin: 11.7 g/dL — ABNORMAL LOW (ref 12.0–15.0)
Immature Granulocytes: 1 %
Lymphocytes Relative: 10 %
Lymphs Abs: 0.6 10*3/uL — ABNORMAL LOW (ref 0.7–4.0)
MCH: 27.6 pg (ref 26.0–34.0)
MCHC: 32.1 g/dL (ref 30.0–36.0)
MCV: 85.8 fL (ref 80.0–100.0)
Monocytes Absolute: 0.2 10*3/uL (ref 0.1–1.0)
Monocytes Relative: 3 %
Neutro Abs: 5.5 10*3/uL (ref 1.7–7.7)
Neutrophils Relative %: 86 %
Platelets: 308 10*3/uL (ref 150–400)
RBC: 4.24 MIL/uL (ref 3.87–5.11)
RDW: 16.6 % — ABNORMAL HIGH (ref 11.5–15.5)
WBC: 6.3 10*3/uL (ref 4.0–10.5)
nRBC: 0 % (ref 0.0–0.2)

## 2019-02-02 LAB — COMPREHENSIVE METABOLIC PANEL
ALT: 35 U/L (ref 0–44)
AST: 44 U/L — ABNORMAL HIGH (ref 15–41)
Albumin: 3.2 g/dL — ABNORMAL LOW (ref 3.5–5.0)
Alkaline Phosphatase: 88 U/L (ref 38–126)
Anion gap: 10 (ref 5–15)
BUN: 14 mg/dL (ref 8–23)
CO2: 23 mmol/L (ref 22–32)
Calcium: 8.7 mg/dL — ABNORMAL LOW (ref 8.9–10.3)
Chloride: 103 mmol/L (ref 98–111)
Creatinine, Ser: 0.49 mg/dL (ref 0.44–1.00)
GFR calc Af Amer: >60 mL/min (ref >60)
GFR calc non Af Amer: >60 mL/min (ref >60)
Glucose, Bld: 176 mg/dL — ABNORMAL HIGH (ref 70–99)
Potassium: 4.2 mmol/L (ref 3.5–5.1)
Sodium: 136 mmol/L (ref 135–145)
Total Bilirubin: 0.6 mg/dL (ref 0.3–1.2)
Total Protein: 6.7 g/dL (ref 6.5–8.1)

## 2019-02-02 LAB — GLUCOSE, CAPILLARY
Glucose-Capillary: 124 mg/dL — ABNORMAL HIGH (ref 70–99)
Glucose-Capillary: 184 mg/dL — ABNORMAL HIGH (ref 70–99)
Glucose-Capillary: 148 mg/dL — ABNORMAL HIGH (ref 70–99)
Glucose-Capillary: 162 mg/dL — ABNORMAL HIGH (ref 70–99)

## 2019-02-02 NOTE — Progress Notes (Signed)
[id number] Hospitalized [facility]osp[facility] GIP Note @ 1400  Ms. Cumbo is under hospice services for a terminal diagnosis of ovarian cancer per Dr. Karie Georges with [facility]. Pt had tested positive for COVID and per her daughter began running a fever and was SOB. Hospice RN made home visit on 1/8 and daughter made decision to send her mother to the ED for further evaluation. This is a related hospital admission.  Pt has completed her course of remdesivir.  She will continue with IV decadron.  PT and OT evaluating and assisting her with mobility. Determination on O2 needed at home is ongoing, [facility] will order should she need O2 at d/c Pt has been doing well mentally and with regards to pain management.    V/S:  97.8, 102/77, HR 90, RR 18, 95% on RA I&O:  Able to use bathroom Lab work:  Unremarkable, AST 44 Diagnostics:  None recently IVs/PRNs:  Decadron 6 mg IV QD, vicodin 5/325 as needed for pain  Problem List PNA due to COVID 19- O2 as needed, chest PT, pulmonary toileting, mobilize, finish decadron (10 days)   D/C planning:  Return home with daughter [name].  She is currently sick with COVID too. GOC:  Unclear.  Pt remains full code, many discussions with code status have occurred. IDT:  Team updated Family:  [name] did not answer.  RN at bedside was able to speak with her and give her a detailed update.  Thank you, Venia Carbon RN, BSN, [place] [facility] Liaison (in [place]) [phone]

## 2019-02-02 NOTE — NC FL2 (Signed)
[place] LEVEL OF CARE SCREENING TOOL     IDENTIFICATION  Patient Name: Kristen Colon Birthdate: 1950-11-02 Sex: female Admission Date (Current Location): 01/27/2019  [facility] and [place] Number:  Herbalist and Address:  [facility],  [place] [address] [place]      Provider Number:    Attending Physician Name and Address:  Little Ishikawa, MD  Relative Name and Phone Number:       Current Level of Care: Hospital Recommended Level of Care: [place] Prior Approval Number:    Date Approved/Denied:   PASRR Number: [id number]  Discharge Plan: SNF    Current Diagnoses: Patient Active Problem List   Diagnosis Date Noted  . Pneumonia due to COVID-19 virus 01/29/2019  . Acute respiratory failure with hypoxia ([place]) 01/27/2019  . COVID-19 virus infection 01/27/2019  . Pelvic mass in female 06/30/2017  . 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 Elevated CA-125 05/12/2017  . Peritoneal carcinomatosis ([place]) 05/12/2017  . 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 Ovarian mass 09/20/2016  . Non-intractable vomiting with nausea 09/20/2016  . Hypertension, uncontrolled 09/20/2016  . Diabetes mellitus type 2 in nonobese ([place]) 09/20/2016  . Acute encephalopathy 09/19/2016  . Schizoaffective disorder, chronic condition with acute exacerbation ([place])   . Schizophrenia, undifferentiated ([place]) 06/21/2014  . Delusional disorder ([place])   . 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 Gallbladder polyp 07/21/2011  . Liver mass 07/21/2011    Orientation RESPIRATION BLADDER Height & Weight     Self, Time, Situation, Place  Normal Continent Weight: 49.4 kg Height:  5\' 5"  (165.1 cm)  BEHAVIORAL SYMPTOMS/MOOD NEUROLOGICAL BOWEL NUTRITION STATUS      Continent Diet(regular)  AMBULATORY STATUS COMMUNICATION OF NEEDS Skin   Limited Assist Verbally Normal                       Personal Care Assistance Level of Assistance  Bathing,  Feeding, Dressing Bathing Assistance: Limited assistance Feeding assistance: Limited assistance Dressing Assistance: Limited assistance     Functional Limitations Info             SPECIAL CARE FACTORS FREQUENCY  PT (By licensed PT)     PT Frequency: 3-5 times weekly              Contractures Contractures Info: Not present    Additional Factors Info  Code Status Code Status Info: full             Current Medications (02/02/2019):  This is the current hospital active medication list Current Facility-Administered Medications  Medication Dose Route Frequency Provider Last Rate Last Admin  . amLODipine (NORVASC) tablet 5 mg  5 mg Oral Daily Jani Gravel, MD   5 mg at 02/02/19 0913  . ascorbic acid (VITAMIN C) tablet 500 mg  500 mg Oral Daily Irene Pap N, DO   500 mg at 02/02/19 0912  . buPROPion (WELLBUTRIN XL) 24 hr tablet 150 mg  150 mg Oral Daily Jani Gravel, MD   150 mg at 02/02/19 0912  . cholecalciferol (VITAMIN D3) tablet 1,000 Units  1,000 Units Oral Daily Irene Pap N, DO   1,000 Units at 02/02/19 0912  . dexamethasone (DECADRON) injection 6 mg  6 mg Intravenous Q24H Jani Gravel, MD   6 mg at 02/01/19 1955  . diazepam (VALIUM) tablet 5 mg  5 mg Oral Q8H PRN Jani Gravel, MD   5 mg at 01/31/19 1712  .  docusate sodium (COLACE) capsule 100 mg  100 mg Oral BID Jani Gravel, MD   100 mg at 02/02/19 0912  . enoxaparin (LOVENOX) injection 40 mg  40 mg Subcutaneous Q24H Jani Gravel, MD   40 mg at 02/01/19 1955  . HYDROcodone-acetaminophen (NORCO/VICODIN) 5-325 MG per tablet 1 tablet  1 tablet Oral Q4H PRN Jani Gravel, MD   1 tablet at 02/02/19 0013  . insulin aspart (novoLOG) injection 0-5 Units  0-5 Units Subcutaneous QHS Jani Gravel, MD      . insulin aspart (novoLOG) injection 0-9 Units  0-9 Units Subcutaneous TID WC Jani Gravel, MD   2 Units at 02/02/19 0912  . ipratropium (ATROVENT HFA) inhaler 2 puff  2 puff Inhalation Q8H Hall, Carole N, DO   2 puff at 02/01/19 2005  .  lactulose (CHRONULAC) 10 GM/15ML solution 10 g  10 g Oral Daily Jani Gravel, MD   10 g at 02/02/19 0912  . letrozole [facility]) tablet 2.5 mg  2.5 mg Oral Daily Jani Gravel, MD   2.5 mg at 02/02/19 0912  . levalbuterol [facility]) inhaler 2 puff  2 puff Inhalation [address], Carole N, DO   2 puff at 02/01/19 2004  . lip balm (CARMEX) ointment   Topical PRN Peyton Bottoms, MD      . lubiprostone (AMITIZA) capsule 24 mcg  24 mcg Oral Daily Jani Gravel, MD   24 mcg at 02/02/19 0912  . memantine (NAMENDA) tablet 5 mg  5 mg Oral BID Jani Gravel, MD   5 mg at 02/02/19 0912  . ondansetron (ZOFRAN) injection 4 mg  4 mg Intravenous Q6H PRN Irene Pap N, DO      . ondansetron (ZOFRAN-ODT) disintegrating tablet 4 mg  4 mg Oral Q8H PRN Jani Gravel, MD   4 mg at 01/28/19 1717  . pantoprazole (PROTONIX) EC tablet 40 mg  40 mg Oral Daily Jani Gravel, MD   40 mg at 02/02/19 0912  . QUEtiapine (SEROQUEL) tablet 100 mg  100 mg Oral BID Barb Merino, MD   100 mg at 02/02/19 0911  . risperiDONE (RISPERDAL) tablet 0.25 mg  0.25 mg Oral BID PRN Jani Gravel, MD   0.25 mg at 01/31/19 1724  . zinc sulfate capsule 220 mg  220 mg Oral Daily Kayleen Memos, DO   220 mg at 02/02/19 [id number]     Discharge Medications: Please see discharge summary for a list of discharge medications.  Relevant Imaging Results:  Relevant Lab Results:   Additional Information SSN- [id number]  Leeroy Cha, RN

## 2019-02-02 NOTE — TOC Initial Note (Signed)
Transition of Care [facility]) - Initial/Assessment Note    Patient Details  Name: Kristen Colon MRN: [id number] Date of Birth: 08-12-1950  Transition of [facility]) CM/SW Contact:    Leeroy Cha, RN Phone Number: 02/02/2019, 2:23 PM  Clinical Narrative:                 Patient and fl2 faxed out to area snf for consideration.  Expected Discharge Plan: Skilled Nursing Facility Barriers to Discharge: SNF Pending bed offer   Patient Goals and CMS Choice   CMS Medicare.gov Compare Post Acute Care list provided to:: Patient Represenative (must comment)(daughter) Choice offered to / list presented to : Adult Children  Expected Discharge Plan and Services Expected Discharge Plan: [place]   Discharge Planning Services: CM Consult Post Acute Care Choice: [place] Living arrangements for the past 2 months: Single Family Home                                      Prior Living Arrangements/Services Living arrangements for the past 2 months: Single Family Home Lives with:: Adult Children Patient language and need for interpreter reviewed:: No Do you feel safe going back to the place where you live?: Yes      Need for Family Participation in Patient Care: Yes (Comment) Care giver support system in place?: Yes (comment)   Criminal Activity/Legal Involvement Pertinent to Current Situation/Hospitalization: No - Comment as needed  Activities of Daily Living Home Assistive Devices/Equipment: Eyeglasses, Dentures (specify type) ADL Screening (condition at time of admission) Patient's cognitive ability adequate to safely complete daily activities?: No Is the patient deaf or have difficulty hearing?: No Does the patient have difficulty seeing, even when wearing glasses/contacts?: No Does the patient have difficulty concentrating, remembering, or making decisions?: Yes Patient able to express need for assistance with ADLs?: Yes Does the patient  have difficulty dressing or bathing?: Yes Independently performs ADLs?: No Communication: Independent Dressing (OT): Needs assistance Grooming: Needs assistance Feeding: Independent Bathing: Needs assistance Toileting: Needs assistance In/Out Bed: Needs assistance Walks in Home: Needs assistance Does the patient have difficulty walking or climbing stairs?: Yes Weakness of Legs: Both Weakness of Arms/Hands: Both  Permission Sought/Granted                  Emotional Assessment Appearance:: Appears stated age     Orientation: : Oriented to Self, Oriented to Place, Oriented to  Time, Oriented to Situation   Psych Involvement: No (comment)  Admission diagnosis:  Acute respiratory failure with hypoxia ([place]) [J96.01] Acute respiratory failure due to COVID-19 ([place]) [U07.1, J96.00] COVID-19 virus infection [U07.1] Patient Active Problem List   Diagnosis Date Noted  . Pneumonia due to COVID-19 virus 01/29/2019  . Acute respiratory failure with hypoxia ([place]) 01/27/2019  . COVID-19 virus infection 01/27/2019  . Pelvic mass in female 06/30/2017  . 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 Elevated CA-125 05/12/2017  . Peritoneal carcinomatosis ([place]) 05/12/2017  . 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 Ovarian mass 09/20/2016  . Non-intractable vomiting with nausea 09/20/2016  . Hypertension, uncontrolled 09/20/2016  . Diabetes mellitus type 2 in nonobese ([place]) 09/20/2016  . Acute encephalopathy 09/19/2016  . Schizoaffective disorder, chronic condition with acute exacerbation ([place])   . Schizophrenia, undifferentiated ([place]) 06/21/2014  . Delusional disorder ([place])   . Psychoses ([place])   . Gallbladder polyp 07/21/2011  . Liver mass 07/21/2011  PCP:  Everardo Beals, NP Pharmacy:   [facility], [place] [address] Phone: [phone] Fax: [phone]     Social Determinants of Health (SDOH)  Interventions    Readmission Risk Interventions No flowsheet data found.

## 2019-02-02 NOTE — Progress Notes (Signed)
PROGRESS NOTE    Kristen Colon  [id number] DOB: 15-Jul-1950 DOA: 01/27/2019 PCP: Everardo Beals, NP    Brief Narrative:  Kristen Colon y.o.female with past medical history of hypertension, type 2 diabetes, schizophrenia, chronic anxiety, breast cancer on letrozole, dementia who presented from home as brought by her daughter with fever and shortness of breath. On presentation to the ED she was febrile, tachycardic and hypoxic.  COVID-19 screening test positive on 01/27/2019.  CTA chest showed no evidence of pulmonary embolism however showed bilateral pulmonary patchy opacities suggestive of COVID-19 viral pneumonia. Was started on COVID-19 directed therapies.  Negative procalcitonin. She goes to adult day care. Patient's daughter denies that patient has any dementia or memory issues.  Assessment & Plan:   Principal Problem:   Pneumonia due to COVID-19 virus Active Problems:   Schizophrenia, undifferentiated ([place])   Hypertension, uncontrolled   Diabetes mellitus type 2 in nonobese (H[facility])   Left ovarian epithelial cancer ([facility])   Acute respiratory failure with hypoxia ([place])   COVID-19 virus infection  Pneumonia due to COVID 19 virus, acute hypoxic respiratory failure due to COVID 19 virus: SpO2: 95 % O2 Flow Rate (L/min): 2 L/min Continue to wean O2 as tolerated, continue incentive spirometry, deep breathing exercises, sputum induction, mucolytic's and bronchodilators.  Out of bed. Covid directed therapy with, steroids, on dexamethasone, continue for 10 days. Remdesivir, finished 5 days of therapy. 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 Outpatient oncology follow-up.  Patient followed by hospice.  Paranoid schizophrenia: Stable.  No acute delusion, hallucinations.  No suicidal or homicidal ideations. Patient on Seroquel and  as needed Risperdal.  Acute metabolic encephalopathy on chronic dementia:  All-time fall precautions. Delirium precautions. She is on multiple medication regimen with Risperdal and Seroquel that she will continue. Patient was on fentanyl, trazodone that was discontinued because of excessive sleepiness.    Type 2 diabetes with hyperglycemia: Holding off oral hypoglycemics.  On insulin coverage while on steroids.  Positive blood cultures: Coag negative.  No indication for treatment.    DVT prophylaxis: Lovenox subcu Code Status: Full code.  However, patient on hospice at home. Family Communication: Patient's daughter on the phone. She is herself check to urgent care.  Disposition Plan: Likely need to go to SNF w/wo hospice pending patient's progress and further discussion with palliative/family.  Antimicrobials:  Anti-infectives (From admission, onward)   Start     Dose/Rate Route Frequency Ordered Stop   01/28/19 2000  remdesivir 100 mg in sodium chloride 0.9 % 100 mL IVPB     100 mg 200 mL/hr over 30 Minutes Intravenous Daily 01/27/19 1940 01/31/19 2325   01/28/19 1000  cefTRIAXone (ROCEPHIN) 1 g in sodium chloride 0.9 % 100 mL IVPB  Status:  Discontinued     1 g 200 mL/hr over 30 Minutes Intravenous Every 24 hours 01/28/19 0834 01/28/19 1159   01/28/19 0200  ciprofloxacin (CIPRO) IVPB 400 mg  Status:  Discontinued     400 mg 200 mL/hr over 60 Minutes Intravenous Every 12 hours 01/28/19 0124 01/28/19 0834   01/27/19 2000  remdesivir 200 mg in sodium chloride 0.9% 250 mL IVPB     200 mg 580 mL/hr over 30 Minutes Intravenous Once 01/27/19 1940 01/27/19 2256         Subjective: No acute issues or events overnight.  Objective: Vitals:   02/02/19 0100 02/02/19 0500 02/02/19 0758 02/02/19 1201  BP:  103/61 117/64 102/77  Pulse: (!) 114 99 80 90  Resp:  16 14 18   Temp:  98.5 F (36.9 C) 97.6 F (36.4 C) 97.8 F (36.6 C)  TempSrc:  Oral Oral Oral  SpO2: (!) 87% 91% 92% 95%   Weight:      Height:       No intake or output data in the 24 hours ending 02/02/19 1301 Filed Weights   01/28/19 1200  Weight: 49.4 kg    Examination:  General exam: Resting comfortably, somewhat cachectic appearing Respiratory system: Clear to auscultation. Respiratory effort normal.  No added sounds. Cardiovascular system: S1 & S2 heard, RRR. No JVD, murmurs, rubs, gallops or clicks. No pedal edema. Gastrointestinal system: Abdomen is nondistended, soft and nontender. No organomegaly or masses felt. Normal bowel sounds heard. Central nervous system: Alert but not oriented.   Extremities: Symmetric 4/5 power. Skin: No rashes, lesions or ulcers  Data Reviewed: I have personally reviewed following labs and imaging studies  CBC: Recent Labs  Lab 01/29/19 0330 01/30/19 0630 01/31/19 0550 02/01/19 0555 02/02/19 0034  WBC 6.1 5.8 8.2 8.0 6.3  NEUTROABS 4.7 4.0 4.2 4.5 5.5  HGB 11.0* 11.5* 12.5 12.4 11.7*  HCT 35.6* 36.5 40.1 38.6 36.4  MCV 87.9 86.7 87.9 87.3 85.8  PLT 268 307 316 311 [id number]   Basic Metabolic Panel: Recent Labs  Lab 01/29/19 0330 01/30/19 0630 01/31/19 0550 02/01/19 0555 02/02/19 0034  NA 136 135 137 137 136  K 4.0 4.3 4.1 4.1 4.2  CL 104 100 103 102 103  CO2 24 26 23 26 23   GLUCOSE 166* 194* 137* 96 176*  BUN 17 15 18 14 14   CREATININE 0.61 0.54 0.58 0.42* 0.49  CALCIUM 8.4* 8.2* 8.4* 8.4* 8.7*   GFR: Estimated Creatinine Clearance: 51.8 mL/min (by C-G formula based on SCr of 0.49 mg/dL). Liver Function Tests: Recent Labs  Lab 01/29/19 0330 01/30/19 0630 01/31/19 0550 02/01/19 0555 02/02/19 0034  AST 25 33 35 56* 44*  ALT 20 30 29  35 35  ALKPHOS 63 68 81 81 88  BILITOT 0.6 0.3 0.3 0.7 0.6  PROT 6.5 6.4* 6.6 6.4* 6.7  ALBUMIN 3.1* 3.0* 3.1* 3.1* 3.2*   CBG: Recent Labs  Lab 02/01/19 0826 02/01/19 1211 02/01/19 1943 02/02/19 0801 02/02/19 1203  GLUCAP 97 128* 163* 184* 148*   Anemia Panel: Recent Labs    01/31/19 0550   FERRITIN 304   Sepsis Labs: Recent Labs  Lab 01/27/19 1730  PROCALCITON <0.10  LATICACIDVEN 1.4    Recent Results (from the past 240 hour(s))  Blood Culture (routine x 2)     Status: Abnormal   Collection Time: 01/27/19  5:11 PM   Specimen: BLOOD  Result Value Ref Range Status   Specimen Description BLOOD RIGHT ANTECUBITAL  Final   Special Requests   Final    BOTTLES DRAWN AEROBIC AND ANAEROBIC Blood Culture adequate volume   Culture  Setup Time   Final    AEROBIC BOTTLE ONLY GRAM POSITIVE COCCI CRITICAL RESULT CALLED TO, READ BACK BY AND VERIFIED WITH: KAREN AMEND PHARM @ 0028 ON 01/30/19 BY ROBINSON Z.     Culture (A)  Final    STAPHYLOCOCCUS SPECIES (COAGULASE NEGATIVE) THE SIGNIFICANCE OF ISOLATING THIS ORGANISM FROM A SINGLE SET OF BLOOD CULTURES WHEN MULTIPLE SETS ARE DRAWN IS UNCERTAIN. PLEASE NOTIFY THE MICROBIOLOGY DEPARTMENT WITHIN ONE WEEK IF SPECIATION  AND SENSITIVITIES ARE REQUIRED. Performed at [facility], [place] [address], [place], [place] [address]    Report Status 01/30/2019 FINAL  Final  SARS CORONAVIRUS 2 (TAT 6-24 HRS) Nasopharyngeal Nasopharyngeal Swab     Status: Abnormal   Collection Time: 01/27/19  5:19 PM   Specimen: Nasopharyngeal Swab  Result Value Ref Range Status   SARS Coronavirus 2 POSITIVE (A) NEGATIVE Final    Comment: RESULT CALLED TO, READ BACK BY AND VERIFIED WITH: Neldon Mc 2203 01/27/2019 D BRADLEY (NOTE) SARS-CoV-2 target nucleic acids are DETECTED. The SARS-CoV-2 RNA is generally detectable in upper and lower respiratory specimens during the acute phase of infection. Positive results are indicative of the presence of SARS-CoV-2 RNA. Clinical correlation with patient history and other diagnostic information is  necessary to determine patient infection status. Positive results do not rule out bacterial infection or co-infection with other viruses.  The expected result is Negative. Fact Sheet for Patients:  SugarRoll.be Fact Sheet for Healthcare Providers: https://www.woods-mathews.com/ This test is not yet approved or cleared by the [place] FDA and  has been authorized for detection and/or diagnosis of SARS-CoV-2 by FDA under an Emergency Use Authorization (EUA). This EUA will remain  in effect (meaning this test can be used) for t he duration of the COVID-19 declaration under Section 564(b)(1) of the Act, 21 U.S.C. section 360bbb-3(b)(1), unless the authorization is terminated or revoked sooner. Performed at [facility], [place] [address] [place],  [address]     Radiology Studies: No results found.  Scheduled Meds: . amLODipine  5 mg Oral Daily  . vitamin C  500 mg Oral Daily  . buPROPion  150 mg Oral Daily  . cholecalciferol  1,000 Units Oral Daily  . dexamethasone (DECADRON) injection  6 mg Intravenous Q24H  . docusate sodium  100 mg Oral BID  . enoxaparin (LOVENOX) injection  40 mg Subcutaneous Q24H  . insulin aspart  0-5 Units Subcutaneous QHS  . insulin aspart  0-9 Units Subcutaneous TID WC  . ipratropium  2 puff Inhalation Q8H  . lactulose  10 g Oral Daily  . letrozole  2.5 mg Oral Daily  . levalbuterol  2 puff Inhalation Q8H  . lubiprostone  24 mcg Oral Daily  . memantine  5 mg Oral BID  . pantoprazole  40 mg Oral Daily  . QUEtiapine  100 mg Oral BID  . zinc sulfate  220 mg Oral Daily   Continuous Infusions:   LOS: 6 days   Time spent: 25 minutes  Little Ishikawa, DO [facility] Pager on epic chat

## 2019-02-02 NOTE — Progress Notes (Signed)
Occupational Therapy Evaluation Patient Details Name: Kristen Colon MRN: [id number] DOB: 08-18-1950 Today's Date: 02/02/2019    History of Present Illness 69 y/o female pt w/ hx of schizophrenia, HTN, headache, dyspnea, dementia, anxiety, recently dx with COVID at ALF had c/o dyspnea. Pt admitted for acute respiratory failure with hypoxia and covid-19   Clinical Impression   Pt from home and lived with her daughter who assisted as needed with ADL tasks. Pt states she did not use a RW at baseline. Plan is for pt to DC to SNF for rehab as pt's daughter has Covid and is unable to care for her mother at this time. Pt requires overall min A with mobility and ADL @ RW level. SpO2 in 90s on RA during session. Pt not oriented to place, time or situation. Very pleasant and cooperative. Will follow acutely.      Follow Up Recommendations  Supervision/Assistance - 24 hour;SNF    Equipment Recommendations  None recommended by OT    Recommendations for Other Services       Precautions / Restrictions Precautions Precautions: Fall Restrictions Weight Bearing Restrictions: No      Mobility Bed Mobility               General bed mobility comments: Pt up in chair  Transfers Overall transfer level: Needs assistance Equipment used: None;4-wheeled walker Transfers: Sit to/from Stand Sit to Stand: Min guard         General transfer comment: assist for safety/lines    Balance Overall balance assessment: Needs assistance Sitting-balance support: Feet supported;No upper extremity supported Sitting balance-Leahy Scale: Good     Standing balance support: During functional activity;Single extremity supported Standing balance-Leahy Scale: Poor Standing balance comment: Static standing with UE support and min guard                           ADL either performed or assessed with clinical judgement   ADL Overall ADL's : Needs assistance/impaired     Grooming:  Supervision/safety;Set up;Standing   Upper Body Bathing: Set up;Supervision/ safety;Sitting   Lower Body Bathing: Min guard;Sit to/from stand   Upper Body Dressing : Set up;Supervision/safety;Sitting   Lower Body Dressing: Min guard;Sit to/from stand   Toilet Transfer: Minimal assistance;Ambulation;RW;Comfort height toilet   Toileting- Clothing Manipulation and Hygiene: Min guard Toileting - Clothing Manipulation Details (indicate cue type and reason): VC to pull up panties     Functional mobility during ADLs: Minimal assistance;Rolling walker       Vision Baseline Vision/History: Wears glasses Additional Comments: Does not have glasses with her     Perception     Praxis      Pertinent Vitals/Pain Pain Assessment: Faces Faces Pain Scale: Hurts a little bit Pain Location: stomach     Hand Dominance Right   Extremity/Trunk Assessment Upper Extremity Assessment Upper Extremity Assessment: Generalized weakness   Lower Extremity Assessment Lower Extremity Assessment: Defer to PT evaluation   Cervical / Trunk Assessment Cervical / Trunk Assessment: Normal   Communication Communication Communication: No difficulties   Cognition Arousal/Alertness: Awake/alert Behavior During Therapy: WFL for tasks assessed/performed Overall Cognitive Status: No family/caregiver present to determine baseline cognitive functioning(cognition impaired) Area of Impairment: Orientation;Attention;Memory;Safety/judgement;Awareness;Problem solving                 Orientation Level: Disoriented to;Place;Time;Situation(unaware she has Covid) Current Attention Level: Selective Memory: Decreased short-term memory   Safety/Judgement: Decreased awareness of safety;Decreased awareness of deficits Awareness:  Emergent Problem Solving: Slow processing General Comments: educated pt several times throughout session that she had Covid   General Comments       Exercises Exercises: Other  exercises Other Exercises Other Exercises: encouraged incentive spirometer. Able to use with VC   Shoulder Instructions      Home Living Family/patient expects to be discharged to:: Skilled nursing facility                                        Prior Functioning/Environment Level of Independence: Needs assistance  Gait / Transfers Assistance Needed: independent - pt states she does not use a RW; unsure ADL's / Homemaking Assistance Needed: daughter assists with ADL            OT Problem List: Decreased strength;Decreased activity tolerance;Impaired balance (sitting and/or standing);Decreased cognition;Decreased safety awareness;Cardiopulmonary status limiting activity;Pain      OT Treatment/Interventions: Self-care/ADL training;Therapeutic exercise;Energy conservation;Neuromuscular education;DME and/or AE instruction;Therapeutic activities;Cognitive remediation/compensation;Patient/family education    OT Goals(Current goals can be found in the care plan section) Acute Rehab OT Goals Patient Stated Goal: to go home OT Goal Formulation: With patient/family Time For Goal Achievement: 02/16/19 Potential to Achieve Goals: Good  OT Frequency: Min 2X/week   Barriers to D/C:            Co-evaluation              AM-PAC OT "6 Clicks" Daily Activity     Outcome Measure Help from another person eating meals?: None Help from another person taking care of personal grooming?: A Little Help from another person toileting, which includes using toliet, bedpan, or urinal?: A Little Help from another person bathing (including washing, rinsing, drying)?: A Little Help from another person to put on and taking off regular upper body clothing?: A Little Help from another person to put on and taking off regular lower body clothing?: A Little 6 Click Score: 19   End of Session Equipment Utilized During Treatment: Rolling walker Nurse Communication: Mobility status;Other  (comment)(encourage mobility)  Activity Tolerance: Patient tolerated treatment well Patient left: in chair;with call bell/phone within reach;with chair alarm set  OT Visit Diagnosis: Unsteadiness on feet (R26.81);Muscle weakness (generalized) (M62.81);Other symptoms and signs involving cognitive function;Pain Pain - part of body: (stomach)                Time: [id number] OT Time Calculation (min): 40 min Charges:  OT General Charges $OT Visit: 1 Visit OT Evaluation $OT Eval Moderate Complexity: 1 Mod OT Treatments $Self Care/Home Management : 23-37 mins  Maurie Boettcher, OT/L   Acute OT Clinical Specialist Acute Rehabilitation Services Pager [phone] Office [phone]   [facility] 02/02/2019, 5:21 PM

## 2019-02-02 NOTE — Progress Notes (Signed)
Walk test results:  O2 on room air at rest: 98% O2 on room air while ambulating: 95%

## 2019-02-02 NOTE — Plan of Care (Signed)

## 2019-02-03 DIAGNOSIS — R05 Cough: Secondary | ICD-10-CM | POA: Diagnosis not present

## 2019-02-03 DIAGNOSIS — U071 COVID-19: Secondary | ICD-10-CM | POA: Diagnosis not present

## 2019-02-03 DIAGNOSIS — J1282 Pneumonia due to Coronavirus disease 2019: Secondary | ICD-10-CM | POA: Diagnosis not present

## 2019-02-03 LAB — GLUCOSE, CAPILLARY
Glucose-Capillary: 226 mg/dL — ABNORMAL HIGH (ref 70–99)
Glucose-Capillary: 283 mg/dL — ABNORMAL HIGH (ref 70–99)
Glucose-Capillary: 295 mg/dL — ABNORMAL HIGH (ref 70–99)

## 2019-02-03 NOTE — Progress Notes (Signed)
PT Cancellation Note  Patient Details Name: Kristen Colon MRN: [id number] DOB: 01-16-1951   Cancelled Treatment:    Reason Eval/Treat Not Completed: Other (comment)(She is very confused today and refused to allow PT to treat today.)  Rollen Sox, PT # [phone] CGV cell  Casandra Doffing 02/03/2019, 2:33 PM

## 2019-02-03 NOTE — Progress Notes (Signed)
Patient refusing accuchecks. Explained importance/educated on insulin and hyperglycemia, and importance of giving insulin if necessary with meals. Still refused. Notified MD.

## 2019-02-03 NOTE — Plan of Care (Signed)

## 2019-02-03 NOTE — Progress Notes (Signed)
[id number] GIP Note    [name] is under hospice services for a terminal diagnosis of ovarian cancer per Dr. Karie Georges with [facility].  Pt had tested positive for COVID and per her daughter began running a fever and was SOB.  Hospice RN made home visit on 1/8 and daughter made decision to send her mother to the ED for further evaluation. This is a related hospital admission.   Pt has completed her course of remdesivir.  She will continue with IV decadron.  PT and OT evaluating and assisting her with mobility. Decided pt will need a rolling walker. Hospital states that pt has decided to discharge to a facility due to pt's daughter sick with covid and unable to care for pt at home.    V/S:  98.5, 128/77, HR 90, RR 18, 91% on RA I&O:  Able to use bathroom Lab work:  Unremarkable Diagnostics:  None recently IVs/PRNs:  Decadron 6 mg IV QD, vicodin 5/325 as needed for pain   Problem List PNA due to COVID 19. Awaiting SNF placement.     D/C planning:  Initial plan was to return home with daughter [name].  She is currently sick with COVID too. Now current plan is to dc to SNF. GOC:  Unclear.  Pt remains full code, many discussions with code status have occurred. IDT:  Team updated Family:  [name] did not answer.  VM left for [name] to call for update.   Kristen Breech, RN [facility] Liaison  [phone]

## 2019-02-03 NOTE — Progress Notes (Signed)
PROGRESS NOTE    Kristen Colon  [id number] DOB: [birth year] DOA: 01/27/2019 PCP: Everardo Beals, NP    Brief Narrative:  larelle [age] y.o.female with past medical history of hypertension, type 2 diabetes, schizophrenia, chronic anxiety, breast cancer on letrozole, dementia who presented from home as brought by her daughter with fever and shortness of breath. On presentation to the ED she was febrile, tachycardic and hypoxic.  COVID-19 screening test positive on 01/27/2019.  CTA chest showed no evidence of pulmonary embolism however showed bilateral pulmonary patchy opacities suggestive of COVID-19 viral pneumonia. Was started on COVID-19 directed therapies.  Negative procalcitonin. She goes to adult day care. Patient's daughter denies that patient has any dementia or memory issues.  Assessment & Plan:   Principal Problem:   Pneumonia due to COVID-19 virus Active Problems:   Schizophrenia, undifferentiated ([place])   Hypertension, uncontrolled   Diabetes mellitus type 2 in nonobese (HCC)   Left ovarian epithelial cancer (H[facility])   Acute respiratory failure with hypoxia ([place])   COVID-19 virus infection  Pneumonia due to COVID 19 virus, acute hypoxic respiratory failure due to COVID 19 virus: Now alternating on room air/2L [place] depending on activity level Continue incentive spirometry, deep breathing exercises, sputum induction, mucolytic's and bronchodilators. Out of bed as tolerated. Covid directed therapy with, steroids, on dexamethasone, continue for 10 days. Remdesivir therapy completed No results for input(s): DDIMER, FERRITIN, LDH, CRP in the last 72 hours.  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  Outpatient oncology follow-up.  Patient followed by hospice.  Paranoid schizophrenia, stable: Stable.   No acute delusion, hallucinations.  No suicidal or homicidal ideations.  Continue Seroquel scheduled    Continue PRN Risperdal.  Acute metabolic encephalopathy on chronic dementia:  All-time fall precautions. Delirium precautions. She is on multiple medication regimen with Risperdal and Seroquel that she will continue. Patient was on fentanyl, trazodone that was discontinued because of excessive sleepiness.    Type 2 diabetes with hyperglycemia: Holding off oral hypoglycemics.  On insulin coverage while on steroids.  Positive blood cultures: Coag negative.  No indication for treatment.    DVT prophylaxis: Lovenox subcu Code Status: Full code.  However, patient on hospice at home. Family Communication: Patient's daughter on the phone. Disposition Plan: Likely need to go to SNF w/wo hospice pending patient's progress and further discussion with palliative/family.  Antimicrobials:  Anti-infectives (From admission, onward)   Start     Dose/Rate Route Frequency Ordered Stop   01/28/19 2000  remdesivir 100 mg in sodium chloride 0.9 % 100 mL IVPB     100 mg 200 mL/hr over 30 Minutes Intravenous Daily 01/27/19 1940 01/31/19 2325   01/28/19 1000  cefTRIAXone (ROCEPHIN) 1 g in sodium chloride 0.9 % 100 mL IVPB  Status:  Discontinued     1 g 200 mL/hr over 30 Minutes Intravenous Every 24 hours 01/28/19 0834 01/28/19 1159   01/28/19 0200  ciprofloxacin (CIPRO) IVPB 400 mg  Status:  Discontinued     400 mg 200 mL/hr over 60 Minutes Intravenous Every 12 hours 01/28/19 0124 01/28/19 0834   01/27/19 2000  remdesivir 200 mg in sodium chloride 0.9% 250 mL IVPB     200 mg 580 mL/hr over 30 Minutes Intravenous Once 01/27/19 1940 01/27/19 2256       Subjective: No acute issues or events overnight.  Denies chest pain, nausea, vomiting, diarrhea, constipation, headache, fevers, chills.  Objective: Vitals:   02/02/19 1640 02/02/19 2013  02/03/19 0316 02/03/19 0712  BP: 116/72 119/71 119/79 128/77  Pulse: 80 (!) 107 (!) 113 90  Resp: 16 18 17 18   Temp: 98 F (36.7 C) 98.4 F (36.9 C) 97.6 F (36.4  C) 98.5 F (36.9 C)  TempSrc: Oral Oral Oral Oral  SpO2: 94% 92% 92% 91%  Weight:      Height:       No intake or output data in the 24 hours ending 02/03/19 0754 Filed Weights   01/28/19 1200  Weight: 49.4 kg    Examination:  General exam: Resting comfortably, somewhat cachectic appearing Respiratory system: Clear to auscultation. Respiratory effort normal.  No added sounds. Cardiovascular system: S1 & S2 heard, RRR. No JVD, murmurs, rubs, gallops or clicks. No pedal edema. Gastrointestinal system: Abdomen is nondistended, soft and nontender. No organomegaly or masses felt. Normal bowel sounds heard. Central nervous system: Alert but not oriented.   Extremities: Symmetric 4/5 power. Skin: No rashes, lesions or ulcers  Data Reviewed: I have personally reviewed following labs and imaging studies  CBC: Recent Labs  Lab 01/29/19 0330 01/30/19 0630 01/31/19 0550 02/01/19 0555 02/02/19 0034  WBC 6.1 5.8 8.2 8.0 6.3  NEUTROABS 4.7 4.0 4.2 4.5 5.5  HGB 11.0* 11.5* 12.5 12.4 11.7*  HCT 35.6* 36.5 40.1 38.6 36.4  MCV 87.9 86.7 87.9 87.3 85.8  PLT 268 307 316 311 [id number]   Basic Metabolic Panel: Recent Labs  Lab 01/29/19 0330 01/30/19 0630 01/31/19 0550 02/01/19 0555 02/02/19 0034  NA 136 135 137 137 136  K 4.0 4.3 4.1 4.1 4.2  CL 104 100 103 102 103  CO2 24 26 23 26 23   GLUCOSE 166* 194* 137* 96 176*  BUN 17 15 18 14 14   CREATININE 0.61 0.54 0.58 0.42* 0.49  CALCIUM 8.4* 8.2* 8.4* 8.4* 8.7*   GFR: Estimated Creatinine Clearance: 51.8 mL/min (by C-G formula based on SCr of 0.49 mg/dL). Liver Function Tests: Recent Labs  Lab 01/29/19 0330 01/30/19 0630 01/31/19 0550 02/01/19 0555 02/02/19 0034  AST 25 33 35 56* 44*  ALT 20 30 29  35 35  ALKPHOS 63 68 81 81 88  BILITOT 0.6 0.3 0.3 0.7 0.6  PROT 6.5 6.4* 6.6 6.4* 6.7  ALBUMIN 3.1* 3.0* 3.1* 3.1* 3.2*   CBG: Recent Labs  Lab 02/01/19 1943 02/02/19 0801 02/02/19 1203 02/02/19 1955 02/03/19 0709  GLUCAP  163* 184* 148* 162* 283*   Anemia Panel: No results for input(s): VITAMINB12, FOLATE, FERRITIN, TIBC, IRON, RETICCTPCT in the last 72 hours. Sepsis Labs: Recent Labs  Lab 01/27/19 1730  PROCALCITON <0.10  LATICACIDVEN 1.4    Recent Results (from the past 240 hour(s))  Blood Culture (routine x 2)     Status: Abnormal   Collection Time: 01/27/19  5:11 PM   Specimen: BLOOD  Result Value Ref Range Status   Specimen Description BLOOD RIGHT ANTECUBITAL  Final   Special Requests   Final    BOTTLES DRAWN AEROBIC AND ANAEROBIC Blood Culture adequate volume   Culture  Setup Time   Final    AEROBIC BOTTLE ONLY GRAM POSITIVE COCCI CRITICAL RESULT CALLED TO, READ BACK BY AND VERIFIED WITH: KAREN AMEND PHARM @ 0028 ON 01/30/19 BY ROBINSON Z.     Culture (A)  Final    STAPHYLOCOCCUS SPECIES (COAGULASE NEGATIVE) THE SIGNIFICANCE OF ISOLATING THIS ORGANISM FROM A SINGLE SET OF BLOOD CULTURES WHEN MULTIPLE SETS ARE DRAWN IS UNCERTAIN. PLEASE NOTIFY THE MICROBIOLOGY DEPARTMENT WITHIN ONE WEEK IF SPECIATION AND SENSITIVITIES  ARE REQUIRED. Performed at [facility], [place] [address], [place], [place] [address]    Report Status 01/30/2019 FINAL  Final  SARS CORONAVIRUS 2 (TAT 6-24 HRS) Nasopharyngeal Nasopharyngeal Swab     Status: Abnormal   Collection Time: 01/27/19  5:19 PM   Specimen: Nasopharyngeal Swab  Result Value Ref Range Status   SARS Coronavirus 2 POSITIVE (A) NEGATIVE Final    Comment: RESULT CALLED TO, READ BACK BY AND VERIFIED WITH: Neldon Mc 2203 01/27/2019 D BRADLEY (NOTE) SARS-CoV-2 target nucleic acids are DETECTED. The SARS-CoV-2 RNA is generally detectable in upper and lower respiratory specimens during the acute phase of infection. Positive results are indicative of the presence of SARS-CoV-2 RNA. Clinical correlation with patient history and other diagnostic information is  necessary to determine patient infection status. Positive results do not rule out bacterial  infection or co-infection with other viruses.  The expected result is Negative. Fact Sheet for Patients: SugarRoll.be Fact Sheet for Healthcare Providers: https://www.woods-mathews.com/ This test is not yet approved or cleared by the [place] FDA and  has been authorized for detection and/or diagnosis of SARS-CoV-2 by FDA under an Emergency Use Authorization (EUA). This EUA will remain  in effect (meaning this test can be used) for t he duration of the COVID-19 declaration under Section 564(b)(1) of the Act, 21 U.S.C. section 360bbb-3(b)(1), unless the authorization is terminated or revoked sooner. Performed at [facility], [place] [address] [place], [place] [address]     Radiology Studies: No results found.  Scheduled Meds: . amLODipine  5 mg Oral Daily  . vitamin C  500 mg Oral Daily  . buPROPion  150 mg Oral Daily  . cholecalciferol  1,000 Units Oral Daily  . dexamethasone (DECADRON) injection  6 mg Intravenous Q24H  . docusate sodium  100 mg Oral BID  . enoxaparin (LOVENOX) injection  40 mg Subcutaneous Q24H  . insulin aspart  0-5 Units Subcutaneous QHS  . insulin aspart  0-9 Units Subcutaneous TID WC  . ipratropium  2 puff Inhalation Q8H  . lactulose  10 g Oral Daily  . letrozole  2.5 mg Oral Daily  . levalbuterol  2 puff Inhalation Q8H  . lubiprostone  24 mcg Oral Daily  . memantine  5 mg Oral BID  . pantoprazole  40 mg Oral Daily  . QUEtiapine  100 mg Oral BID  . zinc sulfate  220 mg Oral Daily   Continuous Infusions:   LOS: 7 days   Time spent: 25 minutes  Little Ishikawa, DO [facility] Pager on epic chat

## 2019-02-04 DIAGNOSIS — J1282 Pneumonia due to Coronavirus disease 2019: Secondary | ICD-10-CM | POA: Diagnosis not present

## 2019-02-04 DIAGNOSIS — R05 Cough: Secondary | ICD-10-CM | POA: Diagnosis not present

## 2019-02-04 DIAGNOSIS — U071 COVID-19: Secondary | ICD-10-CM | POA: Diagnosis not present

## 2019-02-04 LAB — COMPREHENSIVE METABOLIC PANEL
ALT: 35 U/L (ref 0–44)
AST: 28 U/L (ref 15–41)
Albumin: 3.1 g/dL — ABNORMAL LOW (ref 3.5–5.0)
Alkaline Phosphatase: 86 U/L (ref 38–126)
Anion gap: 10 (ref 5–15)
BUN: 19 mg/dL (ref 8–23)
CO2: 23 mmol/L (ref 22–32)
Calcium: 8.9 mg/dL (ref 8.9–10.3)
Chloride: 105 mmol/L (ref 98–111)
Creatinine, Ser: 0.6 mg/dL (ref 0.44–1.00)
GFR calc Af Amer: >60 mL/min (ref >60)
GFR calc non Af Amer: >60 mL/min (ref >60)
Glucose, Bld: 192 mg/dL — ABNORMAL HIGH (ref 70–99)
Potassium: 4.1 mmol/L (ref 3.5–5.1)
Sodium: 138 mmol/L (ref 135–145)
Total Bilirubin: 0.2 mg/dL — ABNORMAL LOW (ref 0.3–1.2)
Total Protein: 7 g/dL (ref 6.5–8.1)

## 2019-02-04 LAB — CBC
HCT: 37.7 % (ref 36.0–46.0)
Hemoglobin: 12.1 g/dL (ref 12.0–15.0)
MCH: 27.4 pg (ref 26.0–34.0)
MCHC: 32.1 g/dL (ref 30.0–36.0)
MCV: 85.3 fL (ref 80.0–100.0)
Platelets: 535 10*3/uL — ABNORMAL HIGH (ref 150–400)
RBC: 4.42 MIL/uL (ref 3.87–5.11)
RDW: 17.2 % — ABNORMAL HIGH (ref 11.5–15.5)
WBC: 8.6 10*3/uL (ref 4.0–10.5)
nRBC: 0 % (ref 0.0–0.2)

## 2019-02-04 LAB — GLUCOSE, CAPILLARY
Glucose-Capillary: 225 mg/dL — ABNORMAL HIGH (ref 70–99)
Glucose-Capillary: 211 mg/dL — ABNORMAL HIGH (ref 70–99)

## 2019-02-04 MED ORDER — POLYETHYLENE GLYCOL 3350 17 G PO PACK
17.0000 g | PACK | Freq: Every day | ORAL | Status: DC
  Administered 2019-02-04 – 2019-02-10 (×6): 17 g via ORAL
  Filled 2019-02-04 (×6): qty 1

## 2019-02-04 MED ORDER — FLEET ENEMA 7-19 GM/118ML RE ENEM: 1.0000 | ENEMA | Freq: Once | RECTAL | Status: DC

## 2019-02-04 MED ORDER — SENNOSIDES-DOCUSATE SODIUM 8.6-50 MG PO TABS
1.0000 | ORAL_TABLET | Freq: Two times a day (BID) | ORAL | Status: DC | PRN
  Administered 2019-02-04 (×2): 1 via ORAL
  Filled 2019-02-04 (×2): qty 1

## 2019-02-04 NOTE — Progress Notes (Signed)
Occupational Therapy Treatment Patient Details Name: SUTTER DRILLING MRN: [id number] DOB: October 29, 1950 Today's Date: 02/04/2019    History of present illness 69 y/o female pt w/ hx of schizophrenia, HTN, headache, dyspnea, dementia, anxiety, recently dx with COVID at ALF had c/o dyspnea. Pt admitted for acute respiratory failure with hypoxia and covid-19   OT comments  Pt making steady progress. Able to ambulate to bathroom for toileting followed by ambulation on 200 ft in hallway using rollator with 1 seated rest break of 5 min. Plan is to DC to SNF for rehab. Will continue to follow acutely.   Follow Up Recommendations  Supervision/Assistance - 24 hour;SNF    Equipment Recommendations  None recommended by OT    Recommendations for Other Services      Precautions / Restrictions Precautions Precautions: Fall       Mobility Bed Mobility Overal bed mobility: Modified Independent                Transfers Overall transfer level: Needs assistance     Sit to Stand: Supervision              Balance Overall balance assessment: Mild deficits observed, not formally tested                                         ADL either performed or assessed with clinical judgement   ADL Overall ADL's : Needs assistance/impaired     Grooming: Supervision/safety;Set up;Standing       Lower Body Bathing: Min guard;Sit to/from stand           Toilet Transfer: Supervision/safety;Ambulation(rollator)   Toileting- Water quality scientist and Hygiene: Supervision/safety;Sit to/from stand       Functional mobility during ADLs: Supervision/safety(rollator)       Vision       Perception     Praxis      Cognition Arousal/Alertness: Awake/alert Behavior During Therapy: Flat affect Overall Cognitive Status: Impaired/Different from baseline                   Orientation Level: Disoriented to;Place;Time Current Attention Level: Selective Memory:  Decreased short-term memory   Safety/Judgement: Decreased awareness of safety;Decreased awareness of deficits Awareness: Emergent Problem Solving: Slow processing General Comments: Less interactive today        Exercises     Shoulder Instructions       General Comments Ambulated @ 200 ft in hall with 1 seated rest break of 5 min; listening to Jazz while walking    Pertinent Vitals/ Pain       Pain Assessment: Faces Faces Pain Scale: No hurt  Home Living                                          Prior Functioning/Environment              Frequency  Min 2X/week        Progress Toward Goals  OT Goals(current goals can now be found in the care plan section)  Progress towards OT goals: Progressing toward goals  Acute Rehab OT Goals Patient Stated Goal: to go home OT Goal Formulation: With patient/family Time For Goal Achievement: 02/16/19 Potential to Achieve Goals: Good ADL Goals Pt Will Perform Lower Body Bathing: with modified independence;sit to/from stand  Pt Will Perform Lower Body Dressing: with modified independence;sit to/from stand Pt Will Transfer to Toilet: with modified independence;ambulating Pt Will Perform Toileting - Clothing Manipulation and hygiene: with modified independence;sit to/from stand  Plan Discharge plan remains appropriate    Co-evaluation                 AM-PAC OT "6 Clicks" Daily Activity     Outcome Measure   Help from another person eating meals?: None Help from another person taking care of personal grooming?: A Little Help from another person toileting, which includes using toliet, bedpan, or urinal?: A Little Help from another person bathing (including washing, rinsing, drying)?: A Little Help from another person to put on and taking off regular upper body clothing?: A Little Help from another person to put on and taking off regular lower body clothing?: A Little 6 Click Score: 19    End of  Session Equipment Utilized During Treatment: Other (comment)  OT Visit Diagnosis: Unsteadiness on feet (R26.81);Muscle weakness (generalized) (M62.81);Other symptoms and signs involving cognitive function;Pain   Activity Tolerance Patient tolerated treatment well   Patient Left in chair;with call bell/phone within reach;with chair alarm set   Nurse Communication Mobility status;Other (comment)(SpO2)        Time: [id number] OT Time Calculation (min): 28 min  Charges: OT General Charges $OT Visit: 1 Visit OT Treatments $Self Care/Home Management : 23-37 mins  Maurie Boettcher, OT/L   Acute OT Clinical Specialist [place] Pager [phone] Office [phone]    [facility] 02/04/2019, 11:06 AM

## 2019-02-04 NOTE — Progress Notes (Signed)
PROGRESS NOTE    Kristen Colon  [id number] DOB: Dec 26, 1950 DOA: 01/27/2019 PCP: Everardo Beals, NP    Brief Narrative:  Kristen Colon y.o.female with past medical history of hypertension, type 2 diabetes, schizophrenia, chronic anxiety, breast cancer on letrozole, dementia who presented from home as brought by her daughter with fever and shortness of breath. On presentation to the ED she was febrile, tachycardic and hypoxic.  COVID-19 screening test positive on 01/27/2019.  CTA chest showed no evidence of pulmonary embolism however showed bilateral pulmonary patchy opacities suggestive of COVID-19 viral pneumonia. Was started on COVID-19 directed therapies.  Negative procalcitonin. She goes to adult day care. Patient's daughter denies that patient has any dementia or memory issues.  Assessment & Plan:   Principal Problem:   Pneumonia due to COVID-19 virus Active Problems:   Schizophrenia, undifferentiated ([place])   Hypertension, uncontrolled   Diabetes mellitus type 2 in nonobese (H[facility])   Left ovarian epithelial cancer ([facility])   Acute respiratory failure with hypoxia ([place])   COVID-19 virus infection  Pneumonia due to COVID 19 virus, acute hypoxic respiratory failure due to COVID 19 virus: Now alternating on room air/2L [place] depending on activity level Continue incentive spirometry, deep breathing exercises, sputum induction, mucolytic's and bronchodilators. Out of bed as tolerated. Covid directed therapy with, steroids, on dexamethasone, continue for 10 days. Remdesivir therapy completed No results for input(s): DDIMER, FERRITIN, LDH, CRP in the last 72 hours.  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  Patient followed by hospice.  Paranoid schizophrenia, stable: No acute delusion, hallucinations.  No suicidal or homicidal ideations.  Continue Seroquel scheduled  Continue PRN  Risperdal.  Acute metabolic encephalopathy on chronic dementia:  All-time fall precautions. Delirium precautions. She is on multiple medication regimen with Risperdal and Seroquel that she will continue. Patient was on fentanyl, trazodone that was discontinued because of excessive sleepiness.   Type 2 diabetes with hyperglycemia: Holding off oral hypoglycemics.  On insulin coverage while on steroids.  Constipation: Continue scheduled miralax, Increase PRNs, add 1x fleets enema  Positive blood cultures: Coag negative. No indication for treatment.    DVT prophylaxis: Lovenox subcu Code Status: Full code.  However, patient on hospice at home. Family Communication: Patient's daughter on the phone. Disposition Plan: Pending placement at SNF  Antimicrobials:  Anti-infectives (From admission, onward)   Start     Dose/Rate Route Frequency Ordered Stop   01/28/19 2000  remdesivir 100 mg in sodium chloride 0.9 % 100 mL IVPB     100 mg 200 mL/hr over 30 Minutes Intravenous Daily 01/27/19 1940 01/31/19 2325   01/28/19 1000  cefTRIAXone (ROCEPHIN) 1 g in sodium chloride 0.9 % 100 mL IVPB  Status:  Discontinued     1 g 200 mL/hr over 30 Minutes Intravenous Every 24 hours 01/28/19 0834 01/28/19 1159   01/28/19 0200  ciprofloxacin (CIPRO) IVPB 400 mg  Status:  Discontinued     400 mg 200 mL/hr over 60 Minutes Intravenous Every 12 hours 01/28/19 0124 01/28/19 0834   01/27/19 2000  remdesivir 200 mg in sodium chloride 0.9% 250 mL IVPB     200 mg 580 mL/hr over 30 Minutes Intravenous Once 01/27/19 1940 01/27/19 2256      Subjective: No acute issues or events overnight. Denies chest pain, nausea, vomiting, diarrhea, constipation, headache, fevers, chills.  Objective: Vitals:   02/03/19 1428 02/03/19 1552 02/03/19 1916 02/04/19 0330  BP:  128/81 (!) 142/80  107/66  Pulse:  90 (!) 101 (!) 110  Resp:  18 18 18   Temp:  98.1 F (36.7 C) 99.1 F (37.3 C) 98.6 F (37 C)  TempSrc:  Oral Oral  Axillary  SpO2: 92% 90% 95% 93%  Weight:      Height:        Intake/Output Summary (Last 24 hours) at 02/04/2019 0757 Last data filed at 02/04/2019 0500 Gross per 24 hour  Intake 200 ml  Output -  Net 200 ml   Filed Weights   01/28/19 1200  Weight: 49.4 kg    Examination:  General exam: Resting comfortably, somewhat cachectic appearing, no acute distress Respiratory system: Clear to auscultation. Respiratory effort normal.  No added sounds. Cardiovascular system: S1 & S2 heard, RRR. No JVD, murmurs, rubs, gallops or clicks. No pedal edema. Gastrointestinal system: Abdomen is nondistended, soft and nontender. No organomegaly or masses felt. Normal bowel sounds heard. Central nervous system: Alert but not oriented.   Extremities: Symmetric 4/5 power. Skin: No rashes, lesions or ulcers  Data Reviewed: I have personally reviewed following labs and imaging studies  CBC: Recent Labs  Lab 01/29/19 0330 01/30/19 0630 01/31/19 0550 02/01/19 0555 02/02/19 0034  WBC 6.1 5.8 8.2 8.0 6.3  NEUTROABS 4.7 4.0 4.2 4.5 5.5  HGB 11.0* 11.5* 12.5 12.4 11.7*  HCT 35.6* 36.5 40.1 38.6 36.4  MCV 87.9 86.7 87.9 87.3 85.8  PLT 268 307 316 311 [id number]   Basic Metabolic Panel: Recent Labs  Lab 01/29/19 0330 01/30/19 0630 01/31/19 0550 02/01/19 0555 02/02/19 0034  NA 136 135 137 137 136  K 4.0 4.3 4.1 4.1 4.2  CL 104 100 103 102 103  CO2 24 26 23 26 23   GLUCOSE 166* 194* 137* 96 176*  BUN 17 15 18 14 14   CREATININE 0.61 0.54 0.58 0.42* 0.49  CALCIUM 8.4* 8.2* 8.4* 8.4* 8.7*   GFR: Estimated Creatinine Clearance: 51.8 mL/min (by C-G formula based on SCr of 0.49 mg/dL). Liver Function Tests: Recent Labs  Lab 01/29/19 0330 01/30/19 0630 01/31/19 0550 02/01/19 0555 02/02/19 0034  AST 25 33 35 56* 44*  ALT 20 30 29  35 35  ALKPHOS 63 68 81 81 88  BILITOT 0.6 0.3 0.3 0.7 0.6  PROT 6.5 6.4* 6.6 6.4* 6.7  ALBUMIN 3.1* 3.0* 3.1* 3.1* 3.2*   CBG: Recent Labs  Lab 02/02/19 1203  02/02/19 1635 02/02/19 1955 02/03/19 0709 02/03/19 2020  GLUCAP 148* 226* 162* 283* 295*   Anemia Panel: No results for input(s): VITAMINB12, FOLATE, FERRITIN, TIBC, IRON, RETICCTPCT in the last 72 hours. Sepsis Labs: No results for input(s): PROCALCITON, LATICACIDVEN in the last 168 hours.  Recent Results (from the past 240 hour(s))  Blood Culture (routine x 2)     Status: Abnormal   Collection Time: 01/27/19  5:11 PM   Specimen: BLOOD  Result Value Ref Range Status   Specimen Description BLOOD RIGHT ANTECUBITAL  Final   Special Requests   Final    BOTTLES DRAWN AEROBIC AND ANAEROBIC Blood Culture adequate volume   Culture  Setup Time   Final    AEROBIC BOTTLE ONLY GRAM POSITIVE COCCI CRITICAL RESULT CALLED TO, READ BACK BY AND VERIFIED WITH: KAREN AMEND PHARM @ 0028 ON 01/30/19 BY ROBINSON Z.     Culture (A)  Final    STAPHYLOCOCCUS SPECIES (COAGULASE NEGATIVE) THE SIGNIFICANCE OF ISOLATING THIS ORGANISM FROM A SINGLE SET OF BLOOD CULTURES WHEN MULTIPLE SETS ARE DRAWN IS UNCERTAIN. PLEASE NOTIFY THE MICROBIOLOGY  DEPARTMENT WITHIN ONE WEEK IF SPECIATION AND SENSITIVITIES ARE REQUIRED. Performed at [facility], [place] [address] [place], [place] [address]    Report Status 01/30/2019 FINAL  Final  SARS CORONAVIRUS 2 (TAT 6-24 HRS) Nasopharyngeal Nasopharyngeal Swab     Status: Abnormal   Collection Time: 01/27/19  5:19 PM   Specimen: Nasopharyngeal Swab  Result Value Ref Range Status   SARS Coronavirus 2 POSITIVE (A) NEGATIVE Final    Comment: RESULT CALLED TO, READ BACK BY AND VERIFIED WITH: Neldon Mc 2203 01/27/2019 D BRADLEY (NOTE) SARS-CoV-2 target nucleic acids are DETECTED. The SARS-CoV-2 RNA is generally detectable in upper and lower respiratory specimens during the acute phase of infection. Positive results are indicative of the presence of SARS-CoV-2 RNA. Clinical correlation with patient history and other diagnostic information is  necessary to determine  patient infection status. Positive results do not rule out bacterial infection or co-infection with other viruses.  The expected result is Negative. Fact Sheet for Patients: SugarRoll.be Fact Sheet for Healthcare Providers: https://www.woods-mathews.com/ This test is not yet approved or cleared by the [place] FDA and  has been authorized for detection and/or diagnosis of SARS-CoV-2 by FDA under an Emergency Use Authorization (EUA). This EUA will remain  in effect (meaning this test can be used) for t he duration of the COVID-19 declaration under Section 564(b)(1) of the Act, 21 U.S.C. section 360bbb-3(b)(1), unless the authorization is terminated or revoked sooner. Performed at [facility], [place] [address], [place], [place] [address]     Radiology Studies: No results found.  Scheduled Meds: . amLODipine  5 mg Oral Daily  . vitamin C  500 mg Oral Daily  . buPROPion  150 mg Oral Daily  . cholecalciferol  1,000 Units Oral Daily  . dexamethasone (DECADRON) injection  6 mg Intravenous Q24H  . docusate sodium  100 mg Oral BID  . enoxaparin (LOVENOX) injection  40 mg Subcutaneous Q24H  . insulin aspart  0-5 Units Subcutaneous QHS  . insulin aspart  0-9 Units Subcutaneous TID WC  . ipratropium  2 puff Inhalation Q8H  . lactulose  10 g Oral Daily  . letrozole  2.5 mg Oral Daily  . levalbuterol  2 puff Inhalation Q8H  . lubiprostone  24 mcg Oral Daily  . memantine  5 mg Oral BID  . pantoprazole  40 mg Oral Daily  . QUEtiapine  100 mg Oral BID  . zinc sulfate  220 mg Oral Daily   Continuous Infusions:   LOS: 8 days   Time spent: 25 minutes  Little Ishikawa, DO [facility] Pager on epic chat

## 2019-02-04 NOTE — Progress Notes (Signed)
[id number] ([facility] Hospitalized Hospice GIP Note  @ 11am  Ms. Roesner is under hospice services for a terminal diagnosis of ovarian cancer per Dr. Karie Georges with [facility]. Pt had tested positive for COVID and per her daughter began running a fever and was SOB. Hospice RN made home visit on 1/8 and daughter made decision to send her mother to the ED for further evaluation. This is a related hospital admission.  Pt currently on room air, finished 5 day course of remdesivir, continuing to receive decadron IV daily.  PT/OT consults to assist with mobility, however pt refused session yesterday.  Today she is able to ambulate to bathroom and using rollator. Due to her deconditioning, daughter [name] does not feel like she can care for her right now. FL2 has been sent to find facility.   V/S:  98.2 oral, 106/78, HR 87, RR 15, SPO2 93% on RA I&O:  360/ 0 Lab work: glucose 192, albumin 3.1, plt 535 Diagnostics:  None since 7th IVs/PRNs:  Decadron 6 mg IV QD  Problem List PNA secondary to COVID- on RA, finished remdesivir, decadron continued, bronchodilators, increase activity as tolerated  D/C planning:  FL2 send for SNF.  Will revoke hospice for SNF as they are unable to pay for both. Hospice can resume once rehab complete. GOC:  Unclear, pt full code and under hospice services.  IDT:  Updated Family:  Attempted to call [name], waiting call back.  Venia Carbon RN, BSN, [facility] Liaison (in [place]) [phone]

## 2019-02-05 DIAGNOSIS — J1282 Pneumonia due to Coronavirus disease 2019: Secondary | ICD-10-CM | POA: Diagnosis not present

## 2019-02-05 DIAGNOSIS — U071 COVID-19: Secondary | ICD-10-CM | POA: Diagnosis not present

## 2019-02-05 DIAGNOSIS — R05 Cough: Secondary | ICD-10-CM | POA: Diagnosis not present

## 2019-02-05 LAB — GLUCOSE, CAPILLARY
Glucose-Capillary: 226 mg/dL — ABNORMAL HIGH (ref 70–99)
Glucose-Capillary: 182 mg/dL — ABNORMAL HIGH (ref 70–99)
Glucose-Capillary: 178 mg/dL — ABNORMAL HIGH (ref 70–99)
Glucose-Capillary: 297 mg/dL — ABNORMAL HIGH (ref 70–99)

## 2019-02-05 NOTE — Plan of Care (Signed)

## 2019-02-05 NOTE — Progress Notes (Signed)
PROGRESS NOTE    Kristen Colon  [id number] DOB: 04/09/1950 DOA: 01/27/2019 PCP: Everardo Beals, NP    Brief Narrative:  Kristen Colon y.o.female with past medical history of hypertension, type 2 diabetes, schizophrenia, chronic anxiety, breast cancer on letrozole, dementia who presented from home as brought by her daughter with fever and shortness of breath. On presentation to the ED she was febrile, tachycardic and hypoxic.  COVID-19 screening test positive on 01/27/2019.  CTA chest showed no evidence of pulmonary embolism however showed bilateral pulmonary patchy opacities suggestive of COVID-19 viral pneumonia. Was started on COVID-19 directed therapies.  Negative procalcitonin. She goes to adult day care. Patient's daughter denies that patient has any dementia or memory issues.  Assessment & Plan:   Principal Problem:   Pneumonia due to COVID-19 virus Active Problems:   Schizophrenia, undifferentiated ([place])   Hypertension, uncontrolled   Diabetes mellitus type 2 in nonobese (HCC)   Left ovarian epithelial cancer (HCC)   Acute respiratory failure with hypoxia (HCC)   COVID-19 virus infection  Goals of care  Note placed in the chart yesterday by hospice team Apparently patient has been followed in the outpatient setting by hospice given her history of breast cancer as below and metastatic ovarian cancer Daughter did not relay that patient was on hospice, patient has been full code since admission, this will be corrected and be made DNR per documentation Patient to discharge to SNF, hospice will be on hold during physical therapy and once stable for discharge back home hospice will resume care over the patient.  Pneumonia due to COVID 19 virus, acute hypoxic respiratory failure due to COVID 19 virus: Now alternating on room air/2L [place] depending on activity level Continue incentive spirometry, deep breathing exercises, sputum induction, mucolytic's and bronchodilators.  Out of bed as tolerated. Covid directed therapy with, steroids, on dexamethasone, continue for 10 days. Remdesivir therapy completed No results for input(s): DDIMER, FERRITIN, LDH, CRP in the last 72 hours.  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  Patient followed by hospice.  Paranoid schizophrenia, stable: No acute delusion, hallucinations.  No suicidal or homicidal ideations.  Continue Seroquel scheduled  Continue PRN Risperdal.  Acute metabolic encephalopathy on chronic dementia:  All-time fall precautions. Delirium precautions. She is on multiple medication regimen with Risperdal and Seroquel that she will continue. Patient was on fentanyl, trazodone that was discontinued because of excessive sleepiness.   Type 2 diabetes with hyperglycemia: Holding off oral hypoglycemics.  On insulin coverage while on steroids.  Constipation: Continue scheduled miralax, Increase PRNs, add 1x fleets enema  Positive blood cultures: Coag negative. No indication for treatment.    DVT prophylaxis: Lovenox subcu Code Status: DNR - as above patient follows with [facility]ice ([facility]) at home and has been DNR for quite some time. Family Communication: Patient's daughter on the phone. Disposition Plan: Pending placement at SNF  Antimicrobials:  Anti-infectives (From admission, onward)   Start     Dose/Rate Route Frequency Ordered Stop   01/28/19 2000  remdesivir 100 mg in sodium chloride 0.9 % 100 mL IVPB     100 mg 200 mL/hr over 30 Minutes Intravenous Daily 01/27/19 1940 01/31/19 2325   01/28/19 1000  cefTRIAXone (ROCEPHIN) 1 g in sodium chloride 0.9 % 100 mL IVPB  Status:  Discontinued     1 g 200 mL/hr over 30 Minutes Intravenous Every 24 hours 01/28/19 0834 01/28/19 1159   01/28/19 0200  ciprofloxacin (  CIPRO) IVPB 400 mg  Status:  Discontinued     400 mg 200 mL/hr over 60 Minutes Intravenous Every  12 hours 01/28/19 0124 01/28/19 0834   01/27/19 2000  remdesivir 200 mg in sodium chloride 0.9% 250 mL IVPB     200 mg 580 mL/hr over 30 Minutes Intravenous Once 01/27/19 1940 01/27/19 2256      Subjective: No acute issues or events overnight. Denies chest pain, nausea, vomiting, diarrhea, constipation, headache, fevers, chills.  Objective: Vitals:   02/04/19 1940 02/05/19 0006 02/05/19 0346 02/05/19 0710  BP: 116/79 109/71 109/77 110/83  Pulse: (!) 117 (!) 124 (!) 127 (!) 116  Resp: 18 20 20 16   Temp: 98.7 F (37.1 C) 97.6 F (36.4 C) 97.6 F (36.4 C) 98 F (36.7 C)  TempSrc: Oral Axillary Axillary Oral  SpO2: 93% 91% 95% 96%  Weight:      Height:        Intake/Output Summary (Last 24 hours) at 02/05/2019 [id number] Last data filed at 02/04/2019 1800 Gross per 24 hour  Intake 600 ml  Output --  Net 600 ml   Filed Weights   01/28/19 1200  Weight: 49.4 kg    Examination:  General exam: Resting comfortably, somewhat cachectic appearing, no acute distress Respiratory system: Clear to auscultation. Respiratory effort normal.  No added sounds. Cardiovascular system: S1 & S2 heard, RRR. No JVD, murmurs, rubs, gallops or clicks. No pedal edema. Gastrointestinal system: Abdomen is nondistended, soft and nontender. No organomegaly or masses felt. Normal bowel sounds heard. Central nervous system: Alert but not oriented. Pleasantly demented  Extremities: Symmetric 4/5 power. Skin: No rashes, lesions or ulcers  Data Reviewed: I have personally reviewed following labs and imaging studies  CBC: Recent Labs  Lab 01/30/19 0630 01/31/19 0550 02/01/19 0555 02/02/19 0034 02/04/19 0740  WBC 5.8 8.2 8.0 6.3 8.6  NEUTROABS 4.0 4.2 4.5 5.5  --   HGB 11.5* 12.5 12.4 11.7* 12.1  HCT 36.5 40.1 38.6 36.4 37.7  MCV 86.7 87.9 87.3 85.8 85.3  PLT 307 316 311 308 [id number]*   Basic Metabolic Panel: Recent Labs  Lab 01/30/19 0630 01/31/19 0550 02/01/19 0555 02/02/19 0034 02/04/19 0740   NA 135 137 137 136 138  K 4.3 4.1 4.1 4.2 4.1  CL 100 103 102 103 105  CO2 26 23 26 23 23   GLUCOSE 194* 137* 96 176* 192*  BUN 15 18 14 14 19   CREATININE 0.54 0.58 0.42* 0.49 0.60  CALCIUM 8.2* 8.4* 8.4* 8.7* 8.9   GFR: Estimated Creatinine Clearance: 51.8 mL/min (by C-G formula based on SCr of 0.6 mg/dL). Liver Function Tests: Recent Labs  Lab 01/30/19 0630 01/31/19 0550 02/01/19 0555 02/02/19 0034 02/04/19 0740  AST 33 35 56* 44* 28  ALT 30 29 35 35 35  ALKPHOS 68 81 81 88 86  BILITOT 0.3 0.3 0.7 0.6 0.2*  PROT 6.4* 6.6 6.4* 6.7 7.0  ALBUMIN 3.0* 3.1* 3.1* 3.2* 3.1*   CBG: Recent Labs  Lab 02/03/19 0709 02/03/19 2020 02/04/19 1057 02/04/19 2124 02/05/19 0741  GLUCAP 283* 295* 225* 211* 226*   Anemia Panel: No results for input(s): VITAMINB12, FOLATE, FERRITIN, TIBC, IRON, RETICCTPCT in the last 72 hours. Sepsis Labs: No results for input(s): PROCALCITON, LATICACIDVEN in the last 168 hours.  Recent Results (from the past 240 hour(s))  Blood Culture (routine x 2)     Status: Abnormal   Collection Time: 01/27/19  5:11 PM   Specimen: BLOOD  Result Value Ref  Range Status   Specimen Description BLOOD RIGHT ANTECUBITAL  Final   Special Requests   Final    BOTTLES DRAWN AEROBIC AND ANAEROBIC Blood Culture adequate volume   Culture  Setup Time   Final    AEROBIC BOTTLE ONLY GRAM POSITIVE COCCI CRITICAL RESULT CALLED TO, READ BACK BY AND VERIFIED WITH: KAREN AMEND [facility]ARM @ 0028 ON 01/30/19 BY ROBINSON Z.     Culture (A)  Final    STAPHYLOCOCCUS SPECIES (COAGULASE NEGATIVE) THE SIGNIFICANCE OF ISOLATING THIS ORGANISM FROM A SINGLE SET OF BLOOD CULTURES WHEN MULTIPLE SETS ARE DRAWN IS UNCERTAIN. PLEASE NOTIFY THE MICROBIOLOGY DEPARTMENT WITHIN ONE WEEK IF SPECIATION AND SENSITIVITIES ARE REQUIRED. Performed at [facility], [place] [address] [place], [place] [address]    Report Status 01/30/2019 FINAL  Final  SARS CORONAVIRUS 2 (TAT 6-24 HRS) Nasopharyngeal  Nasopharyngeal Swab     Status: Abnormal   Collection Time: 01/27/19  5:19 PM   Specimen: Nasopharyngeal Swab  Result Value Ref Range Status   SARS Coronavirus 2 POSITIVE (A) NEGATIVE Final    Comment: RESULT CALLED TO, READ BACK BY AND VERIFIED WITH: Neldon Mc 2203 01/27/2019 D BRADLEY (NOTE) SARS-CoV-2 target nucleic acids are DETECTED. The SARS-CoV-2 RNA is generally detectable in upper and lower respiratory specimens during the acute phase of infection. Positive results are indicative of the presence of SARS-CoV-2 RNA. Clinical correlation with patient history and other diagnostic information is  necessary to determine patient infection status. Positive results do not rule out bacterial infection or co-infection with other viruses.  The expected result is Negative. Fact Sheet for Patients: SugarRoll.be Fact Sheet for Healthcare Providers: https://www.woods-mathews.com/ This test is not yet approved or cleared by the [place] FDA and  has been authorized for detection and/or diagnosis of SARS-CoV-2 by FDA under an Emergency Use Authorization (EUA). This EUA will remain  in effect (meaning this test can be used) for t he duration of the COVID-19 declaration under Section 564(b)(1) of the Act, 21 U.S.C. section 360bbb-3(b)(1), unless the authorization is terminated or revoked sooner. Performed at [facility], [place] [address] [place], [place] [address]     Radiology Studies: No results found.  Scheduled Meds: . amLODipine  5 mg Oral Daily  . vitamin C  500 mg Oral Daily  . buPROPion  150 mg Oral Daily  . cholecalciferol  1,000 Units Oral Daily  . dexamethasone (DECADRON) injection  6 mg Intravenous Q24H  . docusate sodium  100 mg Oral BID  . enoxaparin (LOVENOX) injection  40 mg Subcutaneous Q24H  . insulin aspart  0-5 Units Subcutaneous QHS  . insulin aspart  0-9 Units Subcutaneous TID WC  . ipratropium  2 puff  Inhalation Q8H  . lactulose  10 g Oral Daily  . letrozole  2.5 mg Oral Daily  . levalbuterol  2 puff Inhalation Q8H  . lubiprostone  24 mcg Oral Daily  . memantine  5 mg Oral BID  . pantoprazole  40 mg Oral Daily  . polyethylene glycol  17 g Oral Daily  . QUEtiapine  100 mg Oral BID  . sodium phosphate  1 enema Rectal Once  . zinc sulfate  220 mg Oral Daily   Continuous Infusions:   LOS: 9 days   Time spent: 25 minutes  Little Ishikawa, DO [facility] Pager on epic chat

## 2019-02-05 NOTE — Progress Notes (Signed)
GV [id number] [facility] Hospitalized [facility]osp[facility] GIP Note  @ 4:15pm  Ms. Kristen Colon is under hospice services for a terminal diagnosis of ovarian cancer per Dr. Karie Georges with [facility]. Pt had tested positive for COVID and per her daughter began running a fever and was SOB. Hospice RN made home visit on 1/8 and daughter made decision to send her mother to the ED for further evaluation. This is a related hospital admission.  Pt remains on RA improving in sats at 96% and continues to receive decadron IV daily.  OT consults 02/04/2019 indicating steady progress and pt was able to ambulate to the bathroom followed by 200 ft in the hallway using her Rollator with recommendations for supervision/assistance 24 hr; SNF. No PT consulted yesterday or today at this hour.  Pt has refused hygiene today however bedside nurse indicated she would continue encourage pt on this task. Bedside nurse states pt is doing good today with major issues. Bedside RN has spoken with update to daughter [name]) today who is not feeling well.     V/S:  98 oral, 110/83, HR 116, RR 16, SPO2 96% on RA much improvement from yesterday I&O:  60 ml/unmeasured urine X1 Lab work: None today Diagnostics:  None since 7th IVs/PRNs:  SL placed prior to hospitalization 01/27/2019: Decadron 6 mg IV QD/No PRNs  Problem List Pneumonia due to COVID 19 virus, acute hypoxic respiratory failure due to COVID 19 virus: Pt is a alternating on room air/2L [place] depending on activity level. Continue incentive spirometry, deep breathing exercises, sputum induction, mucolytic's and bronchodilators. Out of bed as tolerated. Covid directed therapy with, steroids, on dexamethasone, continue for 10 days. Remdesivir therapy completed  D/C planning:  FL2 sent for SNF.  Will revoke hospice for SNF as they are unable to pay for both. Hospice can resume once rehab complete. GOC:  Unclear, pt full code and under hospice services.  IDT:  Updated Family:  Attempted  to call [name] LVM and currently waiting a call back however bedside RN Jeani Hawking) has updated the daughter earlier today.  Thank You  Raina Mina, RN, BSN [facility] Liaison [place] are on AMION

## 2019-02-06 DIAGNOSIS — J1282 Pneumonia due to Coronavirus disease 2019: Secondary | ICD-10-CM | POA: Diagnosis not present

## 2019-02-06 DIAGNOSIS — R05 Cough: Secondary | ICD-10-CM | POA: Diagnosis not present

## 2019-02-06 DIAGNOSIS — U071 COVID-19: Secondary | ICD-10-CM | POA: Diagnosis not present

## 2019-02-06 LAB — GLUCOSE, CAPILLARY
Glucose-Capillary: 197 mg/dL — ABNORMAL HIGH (ref 70–99)
Glucose-Capillary: 274 mg/dL — ABNORMAL HIGH (ref 70–99)
Glucose-Capillary: 136 mg/dL — ABNORMAL HIGH (ref 70–99)

## 2019-02-06 MED ORDER — PALIPERIDONE PALMITATE ER 156 MG/ML IM SUSY
156.0000 mg | PREFILLED_SYRINGE | Freq: Once | INTRAMUSCULAR | Status: AC
  Administered 2019-02-06: 156 mg via INTRAMUSCULAR
  Filled 2019-02-06: qty 1

## 2019-02-06 NOTE — Progress Notes (Addendum)
GV [id number] [facility] Hospitalized [facility]ospice GIP Note: @4 :50 PM   Kristen Colon is under hospice services for a terminal diagnosis of ovarian cancer per Dr. Karie Georges with [facility]. Pt had tested positive for COVID and per her daughter began running a fever and was SOB. Hospice RN made home visit on 1/8 and daughter made decision to send her mother to the ED for further evaluation. This is a related hospital admission.  Pt remains on RA with sats at 91%. Bedside nurse states pt is doing well however very quiet today with no major issues. Bedside RN  Also indicates no PRN provided to pt today.  Bedside RN confirms the plan is for SNF for rehab. Liaison spoke with daughter Kristen Colon) today. No inquires or questions at this time.     V/S: 97.9 oral, 102/71, HR 107, RR 16, SPO2 91% on RA I&O: 120 ml/unmeasured urine X1 Lab work: None today Diagnostics: None since 7th IVs/PRNs: SL placed prior to hospitalization 01/27/2019-SL:No PRNs  Problem List Pneumonia due to [place] 19 virus, acute hypoxic respiratory failure due to COVID 19 virus: Continue incentive spirometry, deep breathing exercises, sputum induction, mucolytic's and bronchodilators. Out of bed as tolerated. Covid directed therapy with, steroids, on dexamethasone, continue for 10 days. Remdesivir therapy completed  D/C planning: FL2 sent for SNF. Will revoke hospice for SNF as they are unable to pay for both. Hospice can resume once rehab complete. GOC: Patient to discharge to SNF, hospice will be on hold during physical therapy and once stable for discharge back home hospice will resume care over the patient. Order changed to DNR status 02/05/2019 IDT: Updated Family: Spoke with Kristen Colon briefly with questions or concerns. Verified she can contact hospice with any related issues.  Thank You  Raina Mina, RN, BSN [facility] Liaison [place] are on AMION

## 2019-02-06 NOTE — Plan of Care (Signed)
  Problem: Coping: Goal: Psychosocial and spiritual needs will be supported Outcome: Progressing   Problem: Respiratory: Goal: Will maintain a patent airway Outcome: Progressing Goal: Complications related to the disease process, condition or treatment will be avoided or minimized Outcome: Progressing   Problem: Clinical Measurements: Goal: Ability to maintain clinical measurements within normal limits will improve Outcome: Progressing Goal: Will remain free from infection Outcome: Progressing Goal: Diagnostic test results will improve Outcome: Progressing Goal: Cardiovascular complication will be avoided Outcome: Progressing   Problem: Activity: Goal: Risk for activity intolerance will decrease Outcome: Progressing   Problem: Nutrition: Goal: Adequate nutrition will be maintained Outcome: Progressing   Problem: Coping: Goal: Level of anxiety will decrease Outcome: Progressing   Problem: Elimination: Goal: Will not experience complications related to bowel motility Outcome: Progressing Goal: Will not experience complications related to urinary retention Outcome: Progressing   Problem: Pain Managment: Goal: General experience of comfort will improve Outcome: Progressing   Problem: Safety: Goal: Ability to remain free from injury will improve Outcome: Progressing   Problem: Skin Integrity: Goal: Risk for impaired skin integrity will decrease Outcome: Progressing

## 2019-02-06 NOTE — Progress Notes (Signed)
PROGRESS NOTE    DONYELL BIFFLE  [id number] DOB: 28-Mar-1950 DOA: 01/27/2019 PCP: Everardo Beals, NP    Brief Narrative:  lekeesha strevel y.o.female with past medical history of hypertension, type 2 diabetes, schizophrenia, chronic anxiety, breast cancer on letrozole, dementia who presented from home as brought by her daughter with fever and shortness of breath. On presentation to the ED she was febrile, tachycardic and hypoxic.  COVID-19 screening test positive on 01/27/2019.  CTA chest showed no evidence of pulmonary embolism however showed bilateral pulmonary patchy opacities suggestive of COVID-19 viral pneumonia. Was started on COVID-19 directed therapies.  Negative procalcitonin. She goes to adult day care. Patient's daughter denies that patient has any dementia or memory issues.  Assessment & Plan:   Principal Problem:   Pneumonia due to COVID-19 virus Active Problems:   Schizophrenia, undifferentiated ([place])   Hypertension, uncontrolled   Diabetes mellitus type 2 in nonobese (HCC)   Left ovarian epithelial cancer (HCC)   Acute respiratory failure with hypoxia (HCC)   COVID-19 virus infection   Goals of care  Note placed in the chart yesterday by hospice team Apparently patient has been followed in the outpatient setting by hospice given her history of breast cancer as below and metastatic ovarian cancer Daughter did not relay that patient was on hospice, patient has been full code since admission, this will be corrected and be made DNR per documentation Patient to discharge to SNF, hospice will be on hold during physical therapy and once stable for discharge back home hospice will resume care over the patient.  Pneumonia due to COVID 19 virus, acute hypoxic respiratory failure due to COVID 19 virus: Continues on room air Continue incentive spirometry, deep breathing exercises, sputum induction, mucolytic's and bronchodilators. Out of bed as tolerated. Covid  directed therapy with, steroids, on dexamethasone, continue for 10 days. Remdesivir therapy completed No results for input(s): DDIMER, FERRITIN, LDH, CRP in the last 72 hours.  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  Patient followed by hospice.  Paranoid schizophrenia, stable: No acute delusion, hallucinations.  No suicidal or homicidal ideations.  Continue Seroquel scheduled  Continue PRN Risperdal Paliperidone IM x1 on 02/06/19 per schedule.  Acute metabolic encephalopathy on chronic dementia, resolved: Continue fall precautions.  Delirium precautions.  Patient previously on fentanyl, trazodone that was discontinued because of excessive sleepiness.   Type 2 diabetes with hyperglycemia:  Holding off oral hypoglycemics.  On insulin coverage while on steroids. Glucose likely to improve given now completed steroids  Constipation: Continue scheduled miralax, Increase PRNs, add 1x fleets enema  Positive blood cultures: Coag negative. No indication for treatment.    DVT prophylaxis: Lovenox subcu Code Status: DNR - as above patient follows with [facility]ice ([facility]) at home and has been DNR for quite some time. Family Communication: Patient's daughter on the phone. Disposition Plan: Pending placement at SNF  Antimicrobials:  Anti-infectives (From admission, onward)   Start     Dose/Rate Route Frequency Ordered Stop   01/28/19 2000  remdesivir 100 mg in sodium chloride 0.9 % 100 mL IVPB     100 mg 200 mL/hr over 30 Minutes Intravenous Daily 01/27/19 1940 01/31/19 2325   01/28/19 1000  cefTRIAXone (ROCEPHIN) 1 g in sodium chloride 0.9 % 100 mL IVPB  Status:  Discontinued     1 g 200 mL/hr over 30 Minutes Intravenous Every 24 hours 01/28/19 0834 01/28/19 1159   01/28/19 0200  ciprofloxacin (CIPRO)  IVPB 400 mg  Status:  Discontinued     400 mg 200 mL/hr over 60 Minutes Intravenous Every  12 hours 01/28/19 0124 01/28/19 0834   01/27/19 2000  remdesivir 200 mg in sodium chloride 0.9% 250 mL IVPB     200 mg 580 mL/hr over 30 Minutes Intravenous Once 01/27/19 1940 01/27/19 2256      Subjective: No acute issues or events overnight. Denies chest pain, nausea, vomiting, diarrhea, constipation, headache, fevers, chills.  Objective: Vitals:   02/05/19 1552 02/05/19 1924 02/06/19 0418 02/06/19 0724  BP: 118/69 113/68 110/79 120/75  Pulse: 89 92 (!) 104 86  Resp: 17 18 18 17   Temp: 99.1 F (37.3 C) 97.6 F (36.4 C) 97.7 F (36.5 C) 98.3 F (36.8 C)  TempSrc: Oral Axillary Axillary Oral  SpO2: 96% 96% 97% 94%  Weight:      Height:        Intake/Output Summary (Last 24 hours) at 02/06/2019 [id number] Last data filed at 02/05/2019 1821 Gross per 24 hour  Intake 160 ml  Output --  Net 160 ml   Filed Weights   01/28/19 1200  Weight: 49.4 kg    Examination:  General exam: Resting comfortably, somewhat cachectic appearing, no acute distress Respiratory system: Clear to auscultation. Respiratory effort normal.  No added sounds. Cardiovascular system: S1 & S2 heard, RRR. No JVD, murmurs, rubs, gallops or clicks. No pedal edema. Gastrointestinal system: Abdomen is nondistended, soft and nontender. No organomegaly or masses felt. Normal bowel sounds heard. Central nervous system: Alert but not oriented. Pleasantly demented  Extremities: Symmetric 4/5 power. Skin: No rashes, lesions or ulcers  Data Reviewed: I have personally reviewed following labs and imaging studies  CBC: Recent Labs  Lab 01/31/19 0550 02/01/19 0555 02/02/19 0034 02/04/19 0740  WBC 8.2 8.0 6.3 8.6  NEUTROABS 4.2 4.5 5.5  --   HGB 12.5 12.4 11.7* 12.1  HCT 40.1 38.6 36.4 37.7  MCV 87.9 87.3 85.8 85.3  PLT 316 311 308 [id number]*   Basic Metabolic Panel: Recent Labs  Lab 01/31/19 0550 02/01/19 0555 02/02/19 0034 02/04/19 0740  NA 137 137 136 138  K 4.1 4.1 4.2 4.1  CL 103 102 103 105  CO2 23 26  23 23   GLUCOSE 137* 96 176* 192*  BUN 18 14 14 19   CREATININE 0.58 0.42* 0.49 0.60  CALCIUM 8.4* 8.4* 8.7* 8.9   GFR: Estimated Creatinine Clearance: 51.8 mL/min (by C-G formula based on SCr of 0.6 mg/dL). Liver Function Tests: Recent Labs  Lab 01/31/19 0550 02/01/19 0555 02/02/19 0034 02/04/19 0740  AST 35 56* 44* 28  ALT 29 35 35 35  ALKPHOS 81 81 88 86  BILITOT 0.3 0.7 0.6 0.2*  PROT 6.6 6.4* 6.7 7.0  ALBUMIN 3.1* 3.1* 3.2* 3.1*   CBG: Recent Labs  Lab 02/04/19 2124 02/05/19 0741 02/05/19 1128 02/05/19 1556 02/05/19 2051  GLUCAP 211* 226* 182* 178* 297*   Anemia Panel: No results for input(s): VITAMINB12, FOLATE, FERRITIN, TIBC, IRON, RETICCTPCT in the last 72 hours. Sepsis Labs: No results for input(s): PROCALCITON, LATICACIDVEN in the last 168 hours.  Recent Results (from the past 240 hour(s))  Blood Culture (routine x 2)     Status: Abnormal   Collection Time: 01/27/19  5:11 PM   Specimen: BLOOD  Result Value Ref Range Status   Specimen Description BLOOD RIGHT ANTECUBITAL  Final   Special Requests   Final    BOTTLES DRAWN AEROBIC AND ANAEROBIC Blood Culture adequate volume  Culture  Setup Time   Final    AEROBIC BOTTLE ONLY GRAM POSITIVE COCCI CRITICAL RESULT CALLED TO, READ BACK BY AND VERIFIED WITH: KAREN AMEND PHARM @ 0028 ON 01/30/19 BY ROBINSON Z.     Culture (A)  Final    STAPHYLOCOCCUS SPECIES (COAGULASE NEGATIVE) THE SIGNIFICANCE OF ISOLATING THIS ORGANISM FROM A SINGLE SET OF BLOOD CULTURES WHEN MULTIPLE SETS ARE DRAWN IS UNCERTAIN. PLEASE NOTIFY THE MICROBIOLOGY DEPARTMENT WITHIN ONE WEEK IF SPECIATION AND SENSITIVITIES ARE REQUIRED. Performed at [facility], [place] [address] [place], [place] [address]    Report Status 01/30/2019 FINAL  Final  SARS CORONAVIRUS 2 (TAT 6-24 HRS) Nasopharyngeal Nasopharyngeal Swab     Status: Abnormal   Collection Time: 01/27/19  5:19 PM   Specimen: Nasopharyngeal Swab  Result Value Ref Range Status    SARS Coronavirus 2 POSITIVE (A) NEGATIVE Final    Comment: RESULT CALLED TO, READ BACK BY AND VERIFIED WITH: Neldon Mc 2203 01/27/2019 D BRADLEY (NOTE) SARS-CoV-2 target nucleic acids are DETECTED. The SARS-CoV-2 RNA is generally detectable in upper and lower respiratory specimens during the acute phase of infection. Positive results are indicative of the presence of SARS-CoV-2 RNA. Clinical correlation with patient history and other diagnostic information is  necessary to determine patient infection status. Positive results do not rule out bacterial infection or co-infection with other viruses.  The expected result is Negative. Fact Sheet for Patients: SugarRoll.be Fact Sheet for Healthcare Providers: https://www.woods-mathews.com/ This test is not yet approved or cleared by the [place] FDA and  has been authorized for detection and/or diagnosis of SARS-CoV-2 by FDA under an Emergency Use Authorization (EUA). This EUA will remain  in effect (meaning this test can be used) for t he duration of the COVID-19 declaration under Section 564(b)(1) of the Act, 21 U.S.C. section 360bbb-3(b)(1), unless the authorization is terminated or revoked sooner. Performed at [facility], [place] [address], [place], [place] [address]     Radiology Studies: No results found.  Scheduled Meds: . amLODipine  5 mg Oral Daily  . vitamin C  500 mg Oral Daily  . buPROPion  150 mg Oral Daily  . cholecalciferol  1,000 Units Oral Daily  . docusate sodium  100 mg Oral BID  . enoxaparin (LOVENOX) injection  40 mg Subcutaneous Q24H  . insulin aspart  0-5 Units Subcutaneous QHS  . insulin aspart  0-9 Units Subcutaneous TID WC  . ipratropium  2 puff Inhalation Q8H  . lactulose  10 g Oral Daily  . letrozole  2.5 mg Oral Daily  . levalbuterol  2 puff Inhalation Q8H  . lubiprostone  24 mcg Oral Daily  . memantine  5 mg Oral BID  . pantoprazole  40 mg Oral  Daily  . polyethylene glycol  17 g Oral Daily  . QUEtiapine  100 mg Oral BID  . sodium phosphate  1 enema Rectal Once  . zinc sulfate  220 mg Oral Daily   Continuous Infusions:   LOS: 10 days   Time spent: 25 minutes  Little Ishikawa, DO [facility] Pager on epic chat

## 2019-02-06 NOTE — Plan of Care (Signed)

## 2019-02-07 DIAGNOSIS — J1282 Pneumonia due to Coronavirus disease 2019: Secondary | ICD-10-CM | POA: Diagnosis not present

## 2019-02-07 DIAGNOSIS — U071 COVID-19: Secondary | ICD-10-CM | POA: Diagnosis not present

## 2019-02-07 LAB — GLUCOSE, CAPILLARY
Glucose-Capillary: 172 mg/dL — ABNORMAL HIGH (ref 70–99)
Glucose-Capillary: 117 mg/dL — ABNORMAL HIGH (ref 70–99)
Glucose-Capillary: 179 mg/dL — ABNORMAL HIGH (ref 70–99)
Glucose-Capillary: 304 mg/dL — ABNORMAL HIGH (ref 70–99)
Glucose-Capillary: 154 mg/dL — ABNORMAL HIGH (ref 70–99)
Glucose-Capillary: 252 mg/dL — ABNORMAL HIGH (ref 70–99)

## 2019-02-07 MED ORDER — FENTANYL 12 MCG/HR TD PT72
1.0000 | MEDICATED_PATCH | TRANSDERMAL | Status: DC
  Administered 2019-02-07: 1 via TRANSDERMAL
  Filled 2019-02-07: qty 1

## 2019-02-07 NOTE — Progress Notes (Signed)
PROGRESS NOTE    GYNETH MERROW  [id number] DOB: 1950/09/25 DOA: 01/27/2019 PCP: Everardo Beals, NP    Brief Narrative:  latazia chamber y.o.female with past medical history of hypertension, type 2 diabetes, schizophrenia, chronic anxiety, breast cancer on letrozole, dementia who presented from home as brought by her daughter with fever and shortness of breath. On presentation to the ED she was febrile, tachycardic and hypoxic.  COVID-19 screening test positive on 01/27/2019.  CTA chest showed no evidence of pulmonary embolism however showed bilateral pulmonary patchy opacities suggestive of COVID-19 viral pneumonia. Was started on COVID-19 directed therapies.  Negative procalcitonin. She goes to adult day care. Patient's daughter denies that patient has any dementia or memory issues.  Assessment & Plan:   Principal Problem:   Pneumonia due to COVID-19 virus Active Problems:   Schizophrenia, undifferentiated ([place])   Hypertension, uncontrolled   Diabetes mellitus type 2 in nonobese (HCC)   Left ovarian epithelial cancer ([place])   Acute respiratory failure with hypoxia ([place])   COVID-19 virus infection   Goals of care  Patient being followed in the outpatient setting by hospice given her history of breast cancer as below and metastatic ovarian cancer Continuous DNR, disposition likely to SNF and ultimately hospice will take over once discharged from SNF Liberalize diet, resume patient's home fentanyl patch at 12.5 mcg, will escalate narcotic/pain medication as indicated.  Patient currently appears quite comfortable as below  Pneumonia due to COVID 19 virus, acute hypoxic respiratory failure due to COVID 19 virus, resolving: Continues on room air Continue incentive spirometry, deep breathing exercises, sputum induction, mucolytic's and bronchodilators. Out of bed as tolerated. Patient has completed dexamethasone and remdesivir course No results for input(s): DDIMER, FERRITIN,  LDH, CRP in the last 72 hours.  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  Patient followed by hospice.  Paranoid schizophrenia, stable: No acute delusion, hallucinations.  No suicidal or homicidal ideations.  Continue Seroquel scheduled  Continue PRN Risperdal Paliperidone IM x1 on 02/06/19 per schedule.  Acute metabolic encephalopathy on chronic dementia, resolved: Continue fall precautions.  Delirium precautions.  Patient previously on fentanyl, trazodone that was discontinued because of excessive sleepiness.  Resume fentanyl patch 12.5 mcg as above (home dose 50 mcg)  Type 2 diabetes with hyperglycemia:  Holding off oral hypoglycemics.  On insulin coverage while on steroids. Glucose likely to improve given now completed steroids  Constipation: Continue scheduled miralax, Increase PRNs, add 1x fleets enema  Positive blood cultures: Coag negative. No indication for treatment.    DVT prophylaxis: Lovenox subcu Code Status: DNR - as above patient follows with [facility] at home Family Communication: Patient's daughter updated over the phone. Disposition Plan: Pending placement at SNF; appears to be an issue with placement, insurance.  If unable to have patient's nursing facility bed approved for patient may need to be discharged back home with daughter and have hospice take back over care.  Daughter is aware of insurance issues, case management to follow.  Antimicrobials:  Anti-infectives (From admission, onward)   Start     Dose/Rate Route Frequency Ordered Stop   01/28/19 2000  remdesivir 100 mg in sodium chloride 0.9 % 100 mL IVPB     100 mg 200 mL/hr over 30 Minutes Intravenous Daily 01/27/19 1940 01/31/19 2325   01/28/19 1000  cefTRIAXone (ROCEPHIN) 1 g in sodium chloride 0.9 % 100 mL IVPB  Status:  Discontinued     1  g 200 mL/hr over 30 Minutes Intravenous Every 24  hours 01/28/19 0834 01/28/19 1159   01/28/19 0200  ciprofloxacin (CIPRO) IVPB 400 mg  Status:  Discontinued     400 mg 200 mL/hr over 60 Minutes Intravenous Every 12 hours 01/28/19 0124 01/28/19 0834   01/27/19 2000  remdesivir 200 mg in sodium chloride 0.9% 250 mL IVPB     200 mg 580 mL/hr over 30 Minutes Intravenous Once 01/27/19 1940 01/27/19 2256      Subjective: No acute issues or events overnight. Denies chest pain, nausea, vomiting, diarrhea, constipation, headache, fevers, chills.  Objective: Vitals:   02/06/19 1549 02/06/19 2000 02/07/19 0348 02/07/19 0534  BP: 102/71 125/74 100/67 111/74  Pulse: (!) 104 (!) 106 (!) 115 (!) 103  Resp: 17 18 18    Temp: 97.9 F (36.6 C) (!) 97.4 F (36.3 C) 98.5 F (36.9 C)   TempSrc: Oral Oral Oral   SpO2: 91% 94% 92% 94%  Weight:      Height:        Intake/Output Summary (Last 24 hours) at 02/07/2019 0804 Last data filed at 02/06/2019 1308 Gross per 24 hour  Intake 240 ml  Output --  Net 240 ml   Filed Weights   01/28/19 1200  Weight: 49.4 kg    Examination:  General exam: Resting comfortably, somewhat cachectic appearing, no acute distress Respiratory system: Clear to auscultation. Respiratory effort normal.  No added sounds. Cardiovascular system: S1 & S2 heard, RRR. No JVD, murmurs, rubs, gallops or clicks. No pedal edema. Gastrointestinal system: Abdomen is nondistended, soft and nontender. No organomegaly or masses felt. Normal bowel sounds heard. Central nervous system: Alert but not oriented. Pleasantly demented  Extremities: Symmetric 4/5 power. Skin: No rashes, lesions or ulcers  Data Reviewed: I have personally reviewed following labs and imaging studies  CBC: Recent Labs  Lab 02/01/19 0555 02/02/19 0034 02/04/19 0740  WBC 8.0 6.3 8.6  NEUTROABS 4.5 5.5  --   HGB 12.4 11.7* 12.1  HCT 38.6 36.4 37.7  MCV 87.3 85.8 85.3  PLT 311 308 [id number]*   Basic Metabolic Panel: Recent Labs  Lab 02/01/19 0555  02/02/19 0034 02/04/19 0740  NA 137 136 138  K 4.1 4.2 4.1  CL 102 103 105  CO2 26 23 23   GLUCOSE 96 176* 192*  BUN 14 14 19   CREATININE 0.42* 0.49 0.60  CALCIUM 8.4* 8.7* 8.9   GFR: Estimated Creatinine Clearance: 51.8 mL/min (by C-G formula based on SCr of 0.6 mg/dL). Liver Function Tests: Recent Labs  Lab 02/01/19 0555 02/02/19 0034 02/04/19 0740  AST 56* 44* 28  ALT 35 35 35  ALKPHOS 81 88 86  BILITOT 0.7 0.6 0.2*  PROT 6.4* 6.7 7.0  ALBUMIN 3.1* 3.2* 3.1*   CBG: Recent Labs  Lab 02/05/19 1556 02/05/19 2051 02/06/19 0817 02/06/19 1102 02/06/19 1603  GLUCAP 178* 297* 197* 274* 136*   Anemia Panel: No results for input(s): VITAMINB12, FOLATE, FERRITIN, TIBC, IRON, RETICCTPCT in the last 72 hours. Sepsis Labs: No results for input(s): PROCALCITON, LATICACIDVEN in the last 168 hours.  No results found for this or any previous visit (from the past 240 hour(s)).  Radiology Studies: No results found.  Scheduled Meds: . amLODipine  5 mg Oral Daily  . vitamin C  500 mg Oral Daily  . buPROPion  150 mg Oral Daily  . cholecalciferol  1,000 Units Oral Daily  . docusate sodium  100 mg Oral BID  . enoxaparin (LOVENOX) injection  40 mg Subcutaneous Q24H  . insulin aspart  0-5 Units Subcutaneous QHS  . insulin aspart  0-9 Units Subcutaneous TID WC  . ipratropium  2 puff Inhalation Q8H  . lactulose  10 g Oral Daily  . letrozole  2.5 mg Oral Daily  . levalbuterol  2 puff Inhalation Q8H  . lubiprostone  24 mcg Oral Daily  . memantine  5 mg Oral BID  . pantoprazole  40 mg Oral Daily  . polyethylene glycol  17 g Oral Daily  . QUEtiapine  100 mg Oral BID  . sodium phosphate  1 enema Rectal Once  . zinc sulfate  220 mg Oral Daily   Continuous Infusions:   LOS: 11 days   Time spent: 25 minutes  Little Ishikawa, DO [facility] Pager on epic chat

## 2019-02-07 NOTE — Progress Notes (Signed)
[id number] ([facility] Hospitalized Hospice GIP Note@ 1300   Kristen Colon is under hospice services for a terminal diagnosis of ovarian cancer per Dr. Karie Georges with [facility]. Pt had tested positive for COVID and per her daughter began running a fever and was SOB. Hospice RN made home visit on 1/8 and daughter made decision to send her mother to the ED for further evaluation. This is a related hospital admission.  Patient remains on room air, no current problems that are not being addressed from the hospital perspective.  Pt daughter remains very concerned about the well being of her mother.  [name] does not seem to grasp that [name] is under hospice services. [name] is focused on her  Plan will be to revoke hospice so they can pursue SNF care.  V/S:  97.9 ax, 107/67, HR 114, RR 20% on room air Lab work:  None Diagnostics:  None IVs/PRNs:  vicodin 5-325 mg PO x 1  D/C planning:  Ongoing.  Pt cannot return home in her current state.  Dtr is sick with COVID as well. GOC:  Ongoing.  Pt is now a DNR, however dtr remains optimistic that she will be "cured from cancer from this COVID medicine" IDT:  Updated Family:  Lengthy conversation, dtr is unrealistic with goals for patient.  Venia Carbon RN, BSN, [facility] Liaison (in [place]) [phone]

## 2019-02-08 DIAGNOSIS — U071 COVID-19: Secondary | ICD-10-CM | POA: Diagnosis not present

## 2019-02-08 DIAGNOSIS — J1282 Pneumonia due to Coronavirus disease 2019: Secondary | ICD-10-CM | POA: Diagnosis not present

## 2019-02-08 DIAGNOSIS — R05 Cough: Secondary | ICD-10-CM | POA: Diagnosis not present

## 2019-02-08 LAB — COMPREHENSIVE METABOLIC PANEL
ALT: 29 U/L (ref 0–44)
AST: 20 U/L (ref 15–41)
Albumin: 3.3 g/dL — ABNORMAL LOW (ref 3.5–5.0)
Alkaline Phosphatase: 96 U/L (ref 38–126)
Anion gap: 10 (ref 5–15)
BUN: 18 mg/dL (ref 8–23)
CO2: 24 mmol/L (ref 22–32)
Calcium: 9.1 mg/dL (ref 8.9–10.3)
Chloride: 102 mmol/L (ref 98–111)
Creatinine, Ser: 0.6 mg/dL (ref 0.44–1.00)
GFR calc Af Amer: >60 mL/min (ref >60)
GFR calc non Af Amer: >60 mL/min (ref >60)
Glucose, Bld: 191 mg/dL — ABNORMAL HIGH (ref 70–99)
Potassium: 4.2 mmol/L (ref 3.5–5.1)
Sodium: 136 mmol/L (ref 135–145)
Total Bilirubin: 0.2 mg/dL — ABNORMAL LOW (ref 0.3–1.2)
Total Protein: 7.4 g/dL (ref 6.5–8.1)

## 2019-02-08 LAB — CBC
HCT: 39 % (ref 36.0–46.0)
Hemoglobin: 12.5 g/dL (ref 12.0–15.0)
MCH: 27.4 pg (ref 26.0–34.0)
MCHC: 32.1 g/dL (ref 30.0–36.0)
MCV: 85.3 fL (ref 80.0–100.0)
Platelets: 501 10*3/uL — ABNORMAL HIGH (ref 150–400)
RBC: 4.57 MIL/uL (ref 3.87–5.11)
RDW: 17.3 % — ABNORMAL HIGH (ref 11.5–15.5)
WBC: 9.6 10*3/uL (ref 4.0–10.5)
nRBC: 0 % (ref 0.0–0.2)

## 2019-02-08 LAB — GLUCOSE, CAPILLARY
Glucose-Capillary: 194 mg/dL — ABNORMAL HIGH (ref 70–99)
Glucose-Capillary: 168 mg/dL — ABNORMAL HIGH (ref 70–99)
Glucose-Capillary: 238 mg/dL — ABNORMAL HIGH (ref 70–99)
Glucose-Capillary: 261 mg/dL — ABNORMAL HIGH (ref 70–99)

## 2019-02-08 NOTE — TOC Progression Note (Signed)
Transition of Care [facility]) - Progression Note    Patient Details  Name: Kristen Colon MRN: [id number] Date of Birth: 1950-06-01  [facility] CM/SW Contact  Joaquin Courts, RN Phone Number: 02/08/2019, 4:32 PM  Clinical Narrative:    CM spoke with [facility] rep who states they have had extensive discussions with the patient's daughter regarding her current limitations and ability to physically care for her mother.  Daughter states is now in agreement with patient discharging to [facility]. CM spoke with [facility] rep who states admission will be tomorrow 1/20 at the earliest. Patient's daughter has signed paperwork to revoke hospice services so patient can go to rehab facility, however it will be tomorrow before this is reflected in her records and she can formally be admitted to SNF.  CM to follow-up with facility in am and confirm bed offer and availability and coordinate dc.      Expected Discharge Plan: [place] Barriers to Discharge: SNF Pending bed offer  Expected Discharge Plan and Services Expected Discharge Plan: [place]   Discharge Planning Services: CM Consult Post Acute Care Choice: [place] arrangements for the past 2 months: Single Family Home Expected Discharge Date: 02/08/19                                     Social Determinants of Health (SDOH) Interventions    Readmission Risk Interventions No flowsheet data found.

## 2019-02-08 NOTE — TOC Progression Note (Signed)
Transition of Care [facility]) - Progression Note    Patient Details  Name: Kristen Colon MRN: [id number] Date of Birth: 10/14/1950  [facility]) CM/SW Contact  Joaquin Courts, RN Phone Number: 02/08/2019, 3:14 PM  Clinical Narrative: CM received message from MD indicating he has spoken with patient's daughter and he has determined that patient is medically ready for dc from hospital.  CM attempted to reach daughter again to provide discharge options/ HH choice, daughter is not picking up the phone. Voicemail left requesting call back.  This case was discussed with [facility] supervisor, Danelle Earthly, who is available to assist with navigating this difficult situation.        Expected Discharge Plan: [place] Barriers to Discharge: SNF Pending bed offer  Expected Discharge Plan and Services Expected Discharge Plan: [place]   Discharge Planning Services: CM Consult Post Acute Care Choice: [place] arrangements for the past 2 months: Single Family Home Expected Discharge Date: 02/08/19                                     Social Determinants of Health (SDOH) Interventions    Readmission Risk Interventions No flowsheet data found.

## 2019-02-08 NOTE — Progress Notes (Signed)
Physical Therapy Treatment Patient Details Name: Kristen Colon MRN: [id number] DOB: 1950/08/25 Today's Date: 02/08/2019    History of Present Illness 69 y/o female pt w/ hx of schizophrenia, HTN, headache, dyspnea, dementia, anxiety, recently dx with COVID at ALF had c/o dyspnea. Pt admitted for acute respiratory failure with hypoxia and covid-19    PT Comments    Pt did well during today's therapy session.  She walked the entire unit with min guard assist due to unsteadiness (would likely do better with AD, but reports she doesn't use one normally).  She was able to step up an 8" step x 5 with min guard assist from therapist for balance and use of hallway rail on her other side.  VSS on RA during gait without signs of DOE.  She is reporting stomach pain, but is waiting on lunch and reported this during our previous session. PT will continue to follow acutely for safe mobility progression and pt is appropriate to return home with follow up therapy or at home services (?hospice).   Follow Up Recommendations  Home health PT;Supervision/Assistance - 24 hour     Equipment Recommendations  Rolling walker with 5" wheels    Recommendations for Other Services   NA     Precautions / Restrictions Precautions Precautions: Fall Precaution Comments: mildly unsteady on her feet, dementia    Mobility  Bed Mobility Overal bed mobility: Modified Independent                Transfers Overall transfer level: Needs assistance Equipment used: None Transfers: Sit to/from Stand Sit to Stand: Supervision         General transfer comment: supervision, stands quickly and without assessing her own steadiness.   Ambulation/Gait Ambulation/Gait assistance: Min guard Gait Distance (Feet): 200 Feet Assistive device: 1 person hand held assist Gait Pattern/deviations: Step-through pattern;Staggering left;Staggering right Gait velocity: decr Gait velocity interpretation: 1.31 - 2.62 ft/sec,  indicative of limited community ambulator General Gait Details: Pt with mildly staggering gait pattern, used RW last time wiht PT and did well with device, but reports she doesn't use one at baseline (unsure of the accuracy of her reporting).  Min guard assist for balance and safety during gait.    Stairs Stairs: Yes Stairs assistance: Min guard Stair Management: One rail Right;Step to pattern;Forwards;Backwards Number of Stairs: 1(x5 (8"step)) General stair comments: Pt holding PT's hand on her left and rail on the right, stepping up 5 times onto my 8" step with min guard assist.  No DOE.  Pt could likely have done more if needed.            Balance Overall balance assessment: Needs assistance Sitting-balance support: Feet supported;No upper extremity supported Sitting balance-Leahy Scale: Good     Standing balance support: No upper extremity supported Standing balance-Leahy Scale: Fair Standing balance comment: close supervision in static standing, dynamic activities require min guard assist.                             Cognition Arousal/Alertness: Awake/alert Behavior During Therapy: Flat affect Overall Cognitive Status: History of cognitive impairments - at baseline                                 General Comments: dementia at baseline      Exercises Other Exercises Other Exercises: deferred as pt's lunch was waiting.  General Comments General comments (skin integrity, edema, etc.): VSS on RA during gait.        Pertinent Vitals/Pain Pain Assessment: Faces Faces Pain Scale: Hurts little more Pain Location: stomach Pain Descriptors / Indicators: Discomfort Pain Intervention(s): Limited activity within patient's tolerance;Monitored during session;Repositioned           PT Goals (current goals can now be found in the care plan section) Acute Rehab PT Goals Patient Stated Goal: to go home Progress towards PT goals: Progressing  toward goals    Frequency    Min 3X/week      PT Plan Current plan remains appropriate       AM-PAC PT "6 Clicks" Mobility   Outcome Measure  Help needed turning from your back to your side while in a flat bed without using bedrails?: None Help needed moving from lying on your back to sitting on the side of a flat bed without using bedrails?: None Help needed moving to and from a bed to a chair (including a wheelchair)?: None Help needed standing up from a chair using your arms (e.g., wheelchair or bedside chair)?: None Help needed to walk in hospital room?: A Little Help needed climbing 3-5 steps with a railing? : A Little 6 Click Score: 22    End of Session   Activity Tolerance: Patient tolerated treatment well Patient left: in chair;with call bell/phone within reach;with chair alarm set Nurse Communication: Mobility status PT Visit Diagnosis: Unsteadiness on feet (R26.81);Muscle weakness (generalized) (M62.81)     Time: [id number] PT Time Calculation (min) (ACUTE ONLY): 27 min  Charges:  $Gait Training: 23-37 mins                    Verdene Lennert, PT, DPT  Acute Rehabilitation [phone] pager #[phone] office  @ [facility]: [phone]   02/08/2019, 1:34 PM

## 2019-02-08 NOTE — Progress Notes (Signed)
Called daughter. No answer.

## 2019-02-08 NOTE — TOC Progression Note (Signed)
Transition of Care [facility]) - Progression Note    Patient Details  Name: Kristen Colon MRN: [id number] Date of Birth: 02/18/1950  [facility]) CM/SW Contact  Joaquin Courts, RN Phone Number: 02/08/2019, 3:00 PM  Clinical Narrative:    CM spoke with patient's daughter regarding dc planning. Patient's daughter, [name], very aggressive and confrontational with CM. Cm attempted to discuss discharge options including SNF ( and associated barriers of only one bed offer, patient does not have medicare part A and only one facility is able to extend offer with her medicaid).  Cm also attempted to discuss options for home health services vs continuing with hospice at home services.  Daughter was adamant that she will not send her mother to [facility] which is her only bed offer.  Daughter expressed that patient will return home with therapy, but she is adamant that patient needs to be able to go up the flight of stairs in her home to return.  CM attempted to discuss the difference in therapy at home, versus facility, versus hospital stay. CM also attempted to discuss that MD indicates patient is medically ready for discharge.  Daughter very angry and feels that her mother is not ready to some home, she was expressing that her mother has not had pain medications in two days, is weak, and feels constipated.  CM is unable to reach any consensus on dc plan, though it does sound as daughter prefers home with [facility] health. CM unable to offer choice, daughter states nothing else to discuss and wants to speak with hospital leadership and MD. CM relayed this to [facility] and MD and requested that they call daughter and discuss her concerns. Of note patient was seen by physical therapy today and they worked with patient on maneuvering stairs.  CM did ask daughter is she has considered what she wishes for dc plans of the patient is not able to make it up the stairs in her home and she stated she has someone that can  get her up the stairs.         Expected Discharge Plan: [place] Barriers to Discharge: SNF Pending bed offer  Expected Discharge Plan and Services Expected Discharge Plan: [place]   Discharge Planning Services: CM Consult Post Acute Care Choice: [place] arrangements for the past 2 months: Single Family Home Expected Discharge Date: 02/08/19                                     Social Determinants of Health (SDOH) Interventions    Readmission Risk Interventions No flowsheet data found.

## 2019-02-08 NOTE — Progress Notes (Signed)
Writer provided daughter with update on Pt care. Stated pt did not eat much of breakfast this morning. Daughter verbally upset asked why she didn't receive a call that her mother was not eating. Informed Daughter [name] that Probation officer did call however, there was no answer. Also, informed her that writer offered different breakfast and encouraged food for pill intake. Also, we can order supplement shakes if eating is a concern. Pt stated, "I'm not much of a breakfast eater". Continuing the conversation daughter asked when was the last time her mother received pain medication. Writer informed daughter during 8 am medication pass. Daughter began saying derogatory statement to staff such as "I really hate you don't have common-sense", "the nurses here are the worst'. Writer apologized for her dissatisfaction with service. Writer reassured her that staff are working their [place] discussed with daughter that staff was not denying pt of any medication and that she did received morning scheduled pain medication. Explained how other pain medication is "as needed" (PRN). When asking if in pain, pt did not endorse any pain. Daughter asked to speak to doctor and "who ever is in charge". Text page MD to speak with daughter at his convenience.

## 2019-02-08 NOTE — TOC Progression Note (Signed)
Transition of Care [facility]) - Progression Note    Patient Details  Name: Kristen Colon MRN: [id number] Date of Birth: [birth year]  Transition of Care [facility]) CM/SW [place], [place] Phone Number: 02/08/2019, 10:41 AM  Clinical Narrative:   CSW spoke with patient's daughter, [name], earlier today about SNF placement. [name] indicated that she is feeling really sick and weak herself, so it'll be best for the patient to go to rehab to get stronger herself and both of them be out of quarantine before she comes home. CSW discussed two options in [place], and [name] would prefer [facility]. CSW sent referral and asked [place] to review. Admissions at [facility] contacted [place] that patient's insurance will not cover SNF, because she only has Medicare B, and Medicare A is what covers SNF. Patient can be placed under her Medicaid, but [facility] is unable to take that for rehab. [facility] accepted and could take patient. CSW spoke with [name] later in the afternoon to update her on information, and [name] expressed disappointment that [facility] would not be able to accept the patient. [name] asked about other options, and CSW discussed either going to [facility] under her Medicaid (staying for 30 days) or going back home with home health set up. [name] said given those options, that she would prefer to bring the patient home but the patient will have to be able to do stairs because the bedrooms are upstairs. She will have to navigate a full flight of stairs, about 15 steps. CSW discussed with MD about getting PT to see patient to ensure that she can do stairs prior to returning home. CSW to follow.    Expected Discharge Plan: Skilled Nursing Facility Barriers to Discharge: SNF Pending bed offer  Expected Discharge Plan and Services Expected Discharge Plan: [place]   Discharge Planning Services: CM Consult Post Acute Care Choice: [place] Living arrangements for the past 2 months: Single Family Home                                       Social Determinants of Health (SDOH) Interventions    Readmission Risk Interventions No flowsheet data found.

## 2019-02-08 NOTE — Care Management Important Message (Signed)
Important Message  Patient Details  Name: MAKYNLEIGH HOUX MRN: [id number] Date of Birth: 1950/01/29   Medicare Important Message Given:  Yes - Important Message mailed due to current National Emergency  Verbal consent obtained due to current National Emergency  Relationship to patient: Child Contact Name: [name] Call Date: 02/08/19  Time: 1446 Phone: [phone] Outcome: No Answer/Busy Important Message mailed to: Patient address on file    Delorse Lek 02/08/2019, 2:46 PM

## 2019-02-08 NOTE — Discharge Summary (Addendum)
Physician Discharge Summary  Kristen Colon [id number] DOB: [birth year] DOA: 01/27/2019  PCP: Kristen Beals, NP  Admit date: 01/27/2019 Discharge date: 02/08/2019  Admitted From: Home Disposition: SNF  Recommendations for Outpatient Follow-up:  1. Follow up with PCP in 1-2 weeks 2. Please obtain BMP/CBC in one week  Discharge Condition: Stable CODE STATUS: DNR Diet recommendation: As tolerated  Brief/Interim Summary: [name] past medical history of hypertension, type 2 diabetes, schizophrenia, chronic anxiety, breast cancer on letrozole, dementia who presented from home as brought by her daughter with fever and shortness of breath. On presentation to the ED she was febrile, tachycardic and hypoxic. COVID-19 screening test positive on 01/27/2019. CTA chest showed no evidence of pulmonary embolism however showed bilateral pulmonary patchy opacities suggestive of COVID-19 viral pneumonia. Was started on COVID-19 directed therapies.Negative procalcitonin. She goes to adult day care.  Patient admitted as above with acute hypoxic respiratory failure in the setting of COVID-19 pneumonia, patient was treated per protocol with Remdesivir, dexamethasone now completed therapy.  Patient finished remdesivir on 01/31/2019; and dexamethasone on 02/05/2019. Patient remains on room air since 02/01/2019.  Patient continues to undergo evaluation with physical therapy.  PT continues to recommend ongoing rehab evaluation and physical therapy at SNF. At this time patient has been patient has been medically stable and awaiting SNF placement.  Today patient has been accepted to SNF, otherwise medically stable for discharge. Of note patient does follow with hospice, this will need to be revoked while patient is at [facility].  Hospice can be reinstated after discharge from SNF.  No new medications at discharge, patient will resume all home medications as indicated by hospice and PCP.  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 Unknown Did it involve sudden or severe rash/hives, skin peeling, or any reaction on the inside of your mouth or nose? Unknown Did you need to seek medical attention at a hospital or doctor's office? Unknown When did it last happen?unknown If all above answers are "NO", may proceed with cephalosporin use. Daughter was unaware of allergy   5-alpha Reductase Inhibitors    Percocet [oxycodone-acetaminophen] Itching      Medication List    STOP taking these medications   doxycycline 100 MG tablet Commonly known as: ADOXA     TAKE these medications   acetaminophen 500 MG tablet Commonly known as: TYLENOL Take 500 mg by mouth every 8 (eight) hours as needed for mild pain.   amLODipine 5 MG tablet Commonly known as: NORVASC Take 1 tablet (5 mg total) by mouth daily.   baclofen 10 MG tablet Commonly known as: LIORESAL Take 10 mg by mouth daily as needed for muscle spasms.   buPROPion 150 MG 24 hr tablet Commonly known as: WELLBUTRIN XL Take 150 mg by mouth  daily.   cyclobenzaprine 5 MG tablet Commonly known as: FLEXERIL Take 5 mg by mouth daily.   diazepam 5 MG tablet Commonly known as: VALIUM Take 5 mg by mouth every 8 (eight) hours as needed for anxiety.   fentaNYL 50  MCG/HR Commonly known as: [place] 1 patch onto the skin as directed. Every 48 hours   HYDROcodone-acetaminophen 5-325 MG tablet Commonly known as: NORCO/VICODIN Take 1 tablet by mouth every 4 (four) hours as needed for moderate pain.   Invega 6 MG 24 hr tablet Generic drug: paliperidone Take 6 mg by mouth 2 (two) times daily.   Invega Sustenna 156 MG/ML Susp injection Generic drug: paliperidone Inject 1 mL (156 mg total) into the muscle every 30 (thirty) days.   lactulose 10 GM/15ML solution Commonly known as: CHRONULAC Take 10 g by mouth daily.   letrozole 2.5 MG tablet Commonly known as: FEMARA Take 2.5 mg by mouth daily.   linaclotide 290 MCG Caps capsule Commonly known as: Linzess Take 1 capsule (290 mcg total) by mouth daily.   lisinopril 10 MG tablet Commonly known as: ZESTRIL Take 10 mg by mouth daily.   lubiprostone 24 MCG capsule Commonly known as: AMITIZA Take 24 mcg by mouth daily.   memantine 5 MG tablet Commonly known as: NAMENDA Take 5 mg by mouth 2 (two) times daily.   metFORMIN 500 MG tablet Commonly known as: GLUCOPHAGE Take 500 mg by mouth daily with breakfast.   multivitamin with minerals Tabs tablet Take 1 tablet by mouth daily.   ondansetron 4 MG disintegrating tablet Commonly known as: ZOFRAN-ODT Take 4 mg by mouth every 8 (eight) hours as needed for nausea/vomiting.   QUEtiapine 100 MG tablet Commonly known as: SEROQUEL Take 100 mg by mouth 2 (two) times daily.   risperiDONE 0.25 MG tablet Commonly known as: RISPERDAL Take 0.25 mg by mouth 2 (two) times daily as needed (agitation).   Stool Softener 100 MG capsule Generic drug: docusate sodium Take 100 mg by mouth 2 (two) times daily.   temazepam 15 MG capsule Commonly known as: RESTORIL Take 15 mg by mouth at bedtime.   thiamine 100 MG tablet Take 1 tablet (100 mg total) by mouth daily.   traZODone 50 MG tablet Commonly known as: DESYREL Take 100 mg by mouth at  bedtime.   vitamin C 1000 MG tablet Take 1,000 mg by mouth daily.       Allergies  Allergen Reactions  . Penicillins Other (See Comments)    Edema Did it involve swelling of the face/tongue/throat, SOB, or low BP? Unknown Did it involve sudden or severe rash/hives, skin peeling, or any reaction on the inside of your mouth or nose? Unknown Did you need to seek medical attention at a hospital or doctor's office? Unknown When did it last happen?unknown If all above answers are "NO", may proceed with cephalosporin use. Daughter was unaware of allergy  . 5-Alpha Reductase Inhibitors   . Percocet [Oxycodone-Acetaminophen] Itching     Procedures/Studies: CT ANGIO CHEST PE W OR WO CONTRAST  Result Date: 01/27/2019 CLINICAL DATA:  Tachycardia and elevated D-dimer. EXAM: CT ANGIOGRAPHY CHEST WITH CONTRAST TECHNIQUE: Multidetector CT imaging of the chest was performed using the standard protocol during bolus administration of intravenous contrast. Multiplanar CT image reconstructions and MIPs were obtained to evaluate the vascular anatomy. CONTRAST:  34mL OMNIPAQUE IOHEXOL 350 MG/ML SOLN COMPARISON:  September 29, 2018 FINDINGS: Cardiovascular: Satisfactory opacification of the pulmonary arteries to the segmental level. No evidence of  pulmonary embolism. Normal heart size. No pericardial effusion. Mediastinum/Nodes: No enlarged mediastinal, hilar, or axillary lymph nodes. Thyroid gland, trachea, and esophagus demonstrate no significant findings. Lungs/Pleura: Mild emphysematous lung disease is seen involving the posterior aspect of the bilateral upper lobes. Mild atelectatic changes are also seen within the posterior aspect of the bilateral upper lobes, anteromedial aspect of the right upper lobe and posterior aspect of the bilateral lung bases. There is no evidence of a pleural effusion or pneumothorax. Upper Abdomen: No acute abnormality. Musculoskeletal: No chest wall abnormality. No acute or  significant osseous findings. Review of the MIP images confirms the above findings. IMPRESSION: 1. No evidence of pulmonary embolus. 2. Mild bilateral upper lobe and bibasilar atelectasis. Emphysema (ICD10-J43.9). Electronically Signed   By: Virgina Norfolk M.D.   On: 01/27/2019 20:49   DG Chest Port 1 View  Result Date: 01/27/2019 CLINICAL DATA:  Fever and shortness of breath, possible COVID-19 infection EXAM: PORTABLE CHEST 1 VIEW COMPARISON:  07/29/2017 FINDINGS: Cardiac shadows within normal limits. The lungs are well aerated bilaterally. No focal infiltrate or sizable effusion is seen. No bony abnormality is noted. IMPRESSION: No active disease. Electronically Signed   By: Inez Catalina M.D.   On: 01/27/2019 17:27    Subjective: No acute issues or events overnight.   Discharge Exam: Vitals:   02/08/19 0811 02/08/19 1202  BP: 103/66 115/81  Pulse: (!) 119 78  Resp:    Temp: 98.4 F (36.9 C) 98.6 F (37 C)  SpO2: 96% 94%   Vitals:   02/07/19 2125 02/08/19 0429 02/08/19 0811 02/08/19 1202  BP:  92/61 103/66 115/81  Pulse: (!) 117 (!) 124 (!) 119 78  Resp: 18     Temp: 99 F (37.2 C) 98.3 F (36.8 C) 98.4 F (36.9 C) 98.6 F (37 C)  TempSrc: Oral Axillary Oral Oral  SpO2: 94% 94% 96% 94%  Weight:      Height:        General:  Pleasantly resting in bed, No acute distress. HEENT:  Normocephalic atraumatic.  Sclerae nonicteric, noninjected.  Extraocular movements intact bilaterally. Neck:  Without mass or deformity.  Trachea is midline. Lungs:  Clear to auscultate bilaterally without rhonchi, wheeze, or rales. Heart:  Regular rate and rhythm.  Without murmurs, rubs, or gallops. Abdomen:  Soft, nontender, nondistended.  Without guarding or rebound. Extremities: Without cyanosis, clubbing, edema, or obvious deformity. Vascular:  Dorsalis pedis and posterior tibial pulses palpable bilaterally. Skin:  Warm and dry, no erythema, no ulcerations.   The results of significant  diagnostics from this hospitalization (including imaging, microbiology, ancillary and laboratory) are listed below for reference.     Microbiology: No results found for this or any previous visit (from the past 240 hour(s)).   Labs: BNP (last 3 results) No results for input(s): BNP in the last 8760 hours. Basic Metabolic Panel: Recent Labs  Lab 02/02/19 0034 02/04/19 0740 02/08/19 0820  NA 136 138 136  K 4.2 4.1 4.2  CL 103 105 102  CO2 23 23 24   GLUCOSE 176* 192* 191*  BUN 14 19 18   CREATININE 0.49 0.60 0.60  CALCIUM 8.7* 8.9 9.1   Liver Function Tests: Recent Labs  Lab 02/02/19 0034 02/04/19 0740 02/08/19 0820  AST 44* 28 20  ALT 35 35 29  ALKPHOS 88 86 96  BILITOT 0.6 0.2* 0.2*  PROT 6.7 7.0 7.4  ALBUMIN 3.2* 3.1* 3.3*   No results for input(s): LIPASE, AMYLASE in the last 168 hours.  No results for input(s): AMMONIA in the last 168 hours. CBC: Recent Labs  Lab 02/02/19 0034 02/04/19 0740 02/08/19 0820  WBC 6.3 8.6 9.6  NEUTROABS 5.5  --   --   HGB 11.7* 12.1 12.5  HCT 36.4 37.7 39.0  MCV 85.8 85.3 85.3  PLT 308 535* 501*   Cardiac Enzymes: No results for input(s): CKTOTAL, CKMB, CKMBINDEX, TROPONINI in the last 168 hours. BNP: Invalid input(s): POCBNP CBG: Recent Labs  Lab 02/07/19 1229 02/07/19 1621 02/07/19 2315 02/08/19 0810 02/08/19 1201  GLUCAP 304* 154* 252* 194* 168*   D-Dimer No results for input(s): DDIMER in the last 72 hours. Hgb A1c No results for input(s): HGBA1C in the last 72 hours. Lipid Profile No results for input(s): CHOL, HDL, LDLCALC, TRIG, CHOLHDL, LDLDIRECT in the last 72 hours. Thyroid function studies No results for input(s): TSH, T4TOTAL, T3FREE, THYROIDAB in the last 72 hours.  Invalid input(s): FREET3 Anemia work up No results for input(s): VITAMINB12, FOLATE, FERRITIN, TIBC, IRON, RETICCTPCT in the last 72 hours. Urinalysis    Component Value Date/Time   COLORURINE YELLOW 01/27/2019 1804   APPEARANCEUR  HAZY (A) 01/27/2019 1804   LABSPEC 1.020 01/27/2019 1804   PHURINE 5.0 01/27/2019 1804   GLUCOSEU NEGATIVE 01/27/2019 1804   HGBUR NEGATIVE 01/27/2019 1804   BILIRUBINUR NEGATIVE 01/27/2019 1804   KETONESUR NEGATIVE 01/27/2019 1804   PROTEINUR 100 (A) 01/27/2019 1804   UROBILINOGEN 0.2 10/23/2014 1102   NITRITE NEGATIVE 01/27/2019 1804   LEUKOCYTESUR NEGATIVE 01/27/2019 1804   Sepsis Labs Invalid input(s): PROCALCITONIN,  WBC,  LACTICIDVEN Microbiology No results found for this or any previous visit (from the past 240 hour(s)).   Time coordinating discharge: Over 30 minutes  SIGNED:   Little Ishikawa, DO [facility] 02/08/2019, 1:44 PM Pager   If 7PM-7AM, please contact night-coverage [email or web address] Password [email or web address]

## 2019-02-09 DIAGNOSIS — U071 COVID-19: Secondary | ICD-10-CM | POA: Diagnosis not present

## 2019-02-09 DIAGNOSIS — R05 Cough: Secondary | ICD-10-CM | POA: Diagnosis not present

## 2019-02-09 LAB — GLUCOSE, CAPILLARY
Glucose-Capillary: 165 mg/dL — ABNORMAL HIGH (ref 70–99)
Glucose-Capillary: 231 mg/dL — ABNORMAL HIGH (ref 70–99)
Glucose-Capillary: 211 mg/dL — ABNORMAL HIGH (ref 70–99)

## 2019-02-09 NOTE — TOC Progression Note (Addendum)
Transition of Care [facility]) - Progression Note    Patient Details  Name: Kristen Colon MRN: [id number] Date of Birth: 1950/02/09  [facility] CM/SW Contact  Leeroy Cha, RN Phone Number: 02/09/2019, 8:40 AM  Clinical Narrative:    tct-Shelia Quentin Cornwall at [facility]/ message left for her to return call asap to see if patient can be Violeta Gelinas for update on transfer of patient. TCT-Shelia at [facility] no beds today should have bed first thing in the am will call with bed placment then.  MY number given to call back on. D Expected Discharge Plan: Skilled Nursing Facility Barriers to Discharge: SNF Pending bed offer  Expected Discharge Plan and Services Expected Discharge Plan: [place]   Discharge Planning Services: CM Consult Post Acute Care Choice: [facility] Living arrangements for the past 2 months: Single Family Home Expected Discharge Date: 02/08/19                                     Social Determinants of Health (SDOH) Interventions    Readmission Risk Interventions No flowsheet data found.

## 2019-02-09 NOTE — Discharge Instructions (Signed)
Person Under Monitoring Name: Kristen Colon  Location: [address] [place] [address]   Infection Prevention Recommendations for Individuals Confirmed to have, or Being Evaluated for, 2019 Novel Coronavirus (COVID-19) Infection Who Receive Care at Home  Individuals who are confirmed to have, or are being evaluated for, COVID-19 should follow the prevention steps below until a healthcare provider or local or state health department says they can return to normal activities.  Stay home except to get medical care You should restrict activities outside your home, except for getting medical care. Do not go to work, school, or public areas, and do not use public transportation or taxis.  Call ahead before visiting your doctor Before your medical appointment, call the healthcare provider and tell them that you have, or are being evaluated for, COVID-19 infection. This will help the healthcare provider's office take steps to keep other people from getting infected. Ask your healthcare provider to call the local or state health department.  Monitor your symptoms Seek prompt medical attention if your illness is worsening (e.g., difficulty breathing). Before going to your medical appointment, call the healthcare provider and tell them that you have, or are being evaluated for, COVID-19 infection. Ask your healthcare provider to call the local or state health department.  Wear a facemask You should wear a facemask that covers your nose and mouth when you are in the same room with other people and when you visit a healthcare provider. People who live with or visit you should also wear a facemask while they are in the same room with you.  Separate yourself from other people in your home As much as possible, you should stay in a different room from other people in your home. Also, you should use a separate bathroom, if available.  Avoid sharing household items You should not  share dishes, drinking glasses, cups, eating utensils, towels, bedding, or other items with other people in your home. After using these items, you should wash them thoroughly with soap and water.  Cover your coughs and sneezes Cover your mouth and nose with a tissue when you cough or sneeze, or you can cough or sneeze into your sleeve. Throw used tissues in a lined trash can, and immediately wash your hands with soap and water for at least 20 seconds or use an alcohol-based hand rub.  Wash your [facility] your hands often and thoroughly with soap and water for at least 20 seconds. You can use an alcohol-based hand sanitizer if soap and water are not available and if your hands are not visibly dirty. Avoid touching your eyes, nose, and mouth with unwashed hands.   Prevention Steps for Caregivers and Household Members of Individuals Confirmed to have, or Being Evaluated for, COVID-19 Infection Being Cared for in the Home  If you live with, or provide care at home for, a person confirmed to have, or being evaluated for, COVID-19 infection please follow these guidelines to prevent infection:  Follow healthcare provider's instructions Make sure that you understand and can help the patient follow any healthcare provider instructions for all care.  Provide for the patient's basic needs You should help the patient with basic needs in the home and provide support for getting groceries, prescriptions, and other personal needs.  Monitor the patient's symptoms If they are getting sicker, call his or her medical provider and tell them that the patient has, or is being evaluated for, COVID-19 infection. This will help the healthcare provider's office  take steps to keep other people from getting infected. Ask the healthcare provider to call the local or state health department.  Limit the number of people who have contact with the patient  If possible, have only one caregiver for the  patient.  Other household members should stay in another home or place of residence. If this is not possible, they should stay  in another room, or be separated from the patient as much as possible. Use a separate bathroom, if available.  Restrict visitors who do not have an essential need to be in the home.  Keep older adults, very young children, and other sick people away from the patient Keep older adults, very young children, and those who have compromised immune systems or chronic health conditions away from the patient. This includes people with chronic heart, lung, or kidney conditions, diabetes, and cancer.  Ensure good ventilation Make sure that shared spaces in the home have good air flow, such as from an air conditioner or an opened window, weather permitting.  Wash your hands often  Wash your hands often and thoroughly with soap and water for at least 20 seconds. You can use an alcohol based hand sanitizer if soap and water are not available and if your hands are not visibly dirty.  Avoid touching your eyes, nose, and mouth with unwashed hands.  Use disposable paper towels to dry your hands. If not available, use dedicated cloth towels and replace them when they become wet.  Wear a facemask and gloves  Wear a disposable facemask at all times in the room and gloves when you touch or have contact with the patient's blood, body fluids, and/or secretions or excretions, such as sweat, saliva, sputum, nasal mucus, vomit, urine, or feces.  Ensure the mask fits over your nose and mouth tightly, and do not touch it during use.  Throw out disposable facemasks and gloves after using them. Do not reuse.  Wash your hands immediately after removing your facemask and gloves.  If your personal clothing becomes contaminated, carefully remove clothing and launder. Wash your hands after handling contaminated clothing.  Place all used disposable facemasks, gloves, and other waste in a lined  container before disposing them with other household waste.  Remove gloves and wash your hands immediately after handling these items.  Do not share dishes, glasses, or other household items with the patient  Avoid sharing household items. You should not share dishes, drinking glasses, cups, eating utensils, towels, bedding, or other items with a patient who is confirmed to have, or being evaluated for, COVID-19 infection.  After the person uses these items, you should wash them thoroughly with soap and water.  Wash laundry thoroughly  Immediately remove and wash clothes or bedding that have blood, body fluids, and/or secretions or excretions, such as sweat, saliva, sputum, nasal mucus, vomit, urine, or feces, on them.  Wear gloves when handling laundry from the patient.  Read and follow directions on labels of laundry or clothing items and detergent. In general, wash and dry with the warmest temperatures recommended on the label.  Clean all areas the individual has used often  Clean all touchable surfaces, such as counters, tabletops, doorknobs, bathroom fixtures, toilets, phones, keyboards, tablets, and bedside tables, every day. Also, clean any surfaces that may have blood, body fluids, and/or secretions or excretions on them.  Wear gloves when cleaning surfaces the patient has come in contact with.  Use a diluted bleach solution (e.g., dilute bleach with 1 part  bleach and 10 parts water) or a household disinfectant with a label that says EPA-registered for coronaviruses. To make a bleach solution at home, add 1 tablespoon of bleach to 1 quart (4 cups) of water. For a larger supply, add  cup of bleach to 1 gallon (16 cups) of water.  Read labels of cleaning products and follow recommendations provided on product labels. Labels contain instructions for safe and effective use of the cleaning product including precautions you should take when applying the product, such as wearing gloves or  eye protection and making sure you have good ventilation during use of the product.  Remove gloves and wash hands immediately after cleaning.  Monitor yourself for signs and symptoms of illness Caregivers and household members are considered close contacts, should monitor their health, and will be asked to limit movement outside of the home to the extent possible. Follow the monitoring steps for close contacts listed on the symptom monitoring form.   ? If you have additional questions, contact your local health department or call the epidemiologist on call at [phone] (available 24/7). ? This guidance is subject to change. For the most up-to-date guidance from CDC, please refer to their website: https://www.taylor.biz/ COVID-19 is a respiratory infection that is caused by a virus called severe acute respiratory syndrome coronavirus 2 (SARS-CoV-2). The disease is also known as coronavirus disease or novel coronavirus. In some people, the virus may not cause any symptoms. In others, it may cause a serious infection. The infection can get worse quickly and can lead to complications, such as:  Pneumonia, or infection of the lungs.  Acute respiratory distress syndrome or ARDS. This is a condition in which fluid build-up in the lungs prevents the lungs from filling with air and passing oxygen into the blood.  Acute respiratory failure. This is a condition in which there is not enough oxygen passing from the lungs to the body or when carbon dioxide is not passing from the lungs out of the body.  Sepsis or septic shock. This is a serious bodily reaction to an infection.  Blood clotting problems.  Secondary infections due to bacteria or fungus.  Organ failure. This is when your body's organs stop working. The virus that causes COVID-19 is contagious. This means that it can spread from person to person through droplets from coughs and  sneezes (respiratory secretions). What are the causes? This illness is caused by a virus. You may catch the virus by:  Breathing in droplets from an infected person. Droplets can be spread by a person breathing, speaking, singing, coughing, or sneezing.  Touching something, like a table or a doorknob, that was exposed to the virus (contaminated) and then touching your mouth, nose, or eyes. What increases the risk? Risk for infection You are more likely to be infected with this virus if you:  Are within 6 feet (2 meters) of a person with COVID-19.  Provide care for or live with a person who is infected with COVID-19.  Spend time in crowded indoor spaces or live in shared housing. Risk for serious illness You are more likely to become seriously ill from the virus if you:  Are 36 years of age or older. The higher your age, the more you are at risk for serious illness.  Live in a nursing home or long-term care facility.  Have cancer.  Have a long-term (chronic) disease such as: ? Chronic lung disease, including chronic obstructive pulmonary disease or asthma. ? A long-term disease that lowers  your body's ability to fight infection (immunocompromised). ? Heart disease, including heart failure, a condition in which the arteries that lead to the heart become narrow or blocked (coronary artery disease), a disease which makes the heart muscle thick, weak, or stiff (cardiomyopathy). ? Diabetes. ? Chronic kidney disease. ? Sickle cell disease, a condition in which red blood cells have an abnormal "sickle" shape. ? Liver disease.  Are obese. What are the signs or symptoms? Symptoms of this condition can range from mild to severe. Symptoms may appear any time from 2 to 14 days after being exposed to the virus. They include:  A fever or chills.  A cough.  Difficulty breathing.  Headaches, body aches, or muscle aches.  Runny or stuffy (congested) nose.  A sore throat.  New loss of  taste or smell. Some people may also have stomach problems, such as nausea, vomiting, or diarrhea. Other people may not have any symptoms of COVID-19. How is this diagnosed? This condition may be diagnosed based on:  Your signs and symptoms, especially if: ? You live in an area with a COVID-19 outbreak. ? You recently traveled to or from an area where the virus is common. ? You provide care for or live with a person who was diagnosed with COVID-19. ? You were exposed to a person who was diagnosed with COVID-19.  A physical exam.  Lab tests, which may include: ? Taking a sample of fluid from the back of your nose and throat (nasopharyngeal fluid), your nose, or your throat using a swab. ? A sample of mucus from your lungs (sputum). ? Blood tests.  Imaging tests, which may include, X-rays, CT scan, or ultrasound. How is this treated? At present, there is no medicine to treat COVID-19. Medicines that treat other diseases are being used on a trial basis to see if they are effective against COVID-19. Your health care provider will talk with you about ways to treat your symptoms. For most people, the infection is mild and can be managed at home with rest, fluids, and over-the-counter medicines. Treatment for a serious infection usually takes places in a hospital intensive care unit (ICU). It may include one or more of the following treatments. These treatments are given until your symptoms improve.  Receiving fluids and medicines through an IV.  Supplemental oxygen. Extra oxygen is given through a tube in the nose, a face mask, or a hood.  Positioning you to lie on your stomach (prone position). This makes it easier for oxygen to get into the lungs.  Continuous positive airway pressure (CPAP) or bi-level positive airway pressure (BPAP) machine. This treatment uses mild air pressure to keep the airways open. A tube that is connected to a motor delivers oxygen to the body.  Ventilator. This  treatment moves air into and out of the lungs by using a tube that is placed in your windpipe.  Tracheostomy. This is a procedure to create a hole in the neck so that a breathing tube can be inserted.  Extracorporeal membrane oxygenation (ECMO). This procedure gives the lungs a chance to recover by taking over the functions of the heart and lungs. It supplies oxygen to the body and removes carbon dioxide. Follow these instructions at home: Lifestyle  If you are sick, stay home except to get medical care. Your health care provider will tell you how long to stay home. Call your health care provider before you go for medical care.  Rest at home as told by your  health care provider.  Do not use any products that contain nicotine or tobacco, such as cigarettes, e-cigarettes, and chewing tobacco. If you need help quitting, ask your health care provider.  Return to your normal activities as told by your health care provider. Ask your health care provider what activities are safe for you. General instructions  Take over-the-counter and prescription medicines only as told by your health care provider.  Drink enough fluid to keep your urine pale yellow.  Keep all follow-up visits as told by your health care provider. This is important. How is this prevented?  There is no vaccine to help prevent COVID-19 infection. However, there are steps you can take to protect yourself and others from this virus. To protect yourself:   Do not travel to areas where COVID-19 is a risk. The areas where COVID-19 is reported change often. To identify high-risk areas and travel restrictions, check the CDC travel website: FatFares.com.br  If you live in, or must travel to, an area where COVID-19 is a risk, take precautions to avoid infection. ? Stay away from people who are sick. ? Wash your hands often with soap and water for 20 seconds. If soap and water are not available, use an alcohol-based hand  sanitizer. ? Avoid touching your mouth, face, eyes, or nose. ? Avoid going out in public, follow guidance from your state and local health authorities. ? If you must go out in public, wear a cloth face covering or face mask. Make sure your mask covers your nose and mouth. ? Avoid crowded indoor spaces. Stay at least 6 feet (2 meters) away from others. ? Disinfect objects and surfaces that are frequently touched every day. This may include:  Counters and tables.  Doorknobs and light switches.  Sinks and faucets.  Electronics, such as phones, remote controls, keyboards, computers, and tablets. To protect others: If you have symptoms of COVID-19, take steps to prevent the virus from spreading to others.  If you think you have a COVID-19 infection, contact your health care provider right away. Tell your health care team that you think you may have a COVID-19 infection.  Stay home. Leave your house only to seek medical care. Do not use public transport.  Do not travel while you are sick.  Wash your hands often with soap and water for 20 seconds. If soap and water are not available, use alcohol-based hand sanitizer.  Stay away from other members of your household. Let healthy household members care for children and pets, if possible. If you have to care for children or pets, wash your hands often and wear a mask. If possible, stay in your own room, separate from others. Use a different bathroom.  Make sure that all people in your household wash their hands well and often.  Cough or sneeze into a tissue or your sleeve or elbow. Do not cough or sneeze into your hand or into the air.  Wear a cloth face covering or face mask. Make sure your mask covers your nose and mouth. Where to find more information  Centers for Disease Control and Prevention: PurpleGadgets.be  World Health Organization: https://www.castaneda.info/ Contact a health care provider  if:  You live in or have traveled to an area where COVID-19 is a risk and you have symptoms of the infection.  You have had contact with someone who has COVID-19 and you have symptoms of the infection. Get help right away if:  You have trouble breathing.  You have pain or  pressure in your chest.  You have confusion.  You have bluish lips and fingernails.  You have difficulty waking from sleep.  You have symptoms that get worse. These symptoms may represent a serious problem that is an emergency. Do not wait to see if the symptoms will go away. Get medical help right away. Call your local emergency services (911 in the [place] Do not drive yourself to the hospital. Let the emergency medical personnel know if you think you have COVID-19. Summary  COVID-19 is a respiratory infection that is caused by a virus. It is also known as coronavirus disease or novel coronavirus. It can cause serious infections, such as pneumonia, acute respiratory distress syndrome, acute respiratory failure, or sepsis.  The virus that causes COVID-19 is contagious. This means that it can spread from person to person through droplets from breathing, speaking, singing, coughing, or sneezing.  You are more likely to develop a serious illness if you are 51 years of age or older, have a weak immune system, live in a nursing home, or have chronic disease.  There is no medicine to treat COVID-19. Your health care provider will talk with you about ways to treat your symptoms.  Take steps to protect yourself and others from infection. Wash your hands often and disinfect objects and surfaces that are frequently touched every day. Stay away from people who are sick and wear a mask if you are sick. This information is not intended to replace advice given to you by your health care provider. Make sure you discuss any questions you have with your health care provider. Document Revised: 11/05/2018 Document Reviewed:  02/11/2018 Elsevier Patient Education  [place].

## 2019-02-09 NOTE — Progress Notes (Addendum)
Occupational Therapy Treatment Patient Details Name: Kristen Colon MRN: [id number] DOB: [birth year] Today's Date: 02/09/2019    History of present illness 69 y/o female pt w/ hx of schizophrenia, HTN, headache, dyspnea, dementia, anxiety, recently dx with COVID at ALF had c/o dyspnea. Pt admitted for acute respiratory failure with hypoxia and covid-19   OT comments  Pt making steady progress towards OT goals, presents seated EOB with RN present, pt agreeable to working with therapy. Pt tolerating room and hallway level activity, though increased fatigue noted post hallway mobility. Pt overall completing functional mobility with minguard (HHA), tolerating x2 standing grooming and toileting ADL with supervision-minguard throughout. SpO2 >92% on RA. Will continue per POC at this time.   Follow Up Recommendations  Supervision/Assistance - 24 hour;SNF    Equipment Recommendations  None recommended by OT          Precautions / Restrictions Precautions Precautions: Fall Precaution Comments: mildly unsteady on her feet, dementia Restrictions Weight Bearing Restrictions: No       Mobility Bed Mobility Overal bed mobility: Modified Independent                Transfers Overall transfer level: Needs assistance Equipment used: None Transfers: Sit to/from Stand Sit to Stand: Supervision         General transfer comment: supervision for safety and balance    Balance Overall balance assessment: Needs assistance Sitting-balance support: Feet supported;No upper extremity supported Sitting balance-Leahy Scale: Good     Standing balance support: No upper extremity supported Standing balance-Leahy Scale: Fair Standing balance comment: close supervision in static standing, dynamic activities require min guard assist.                            ADL either performed or assessed with clinical judgement   ADL Overall ADL's : Needs assistance/impaired     Grooming:  Supervision/safety;Set up;Standing;Wash/dry hands;Wash/dry face Grooming Details (indicate cue type and reason): standing at sink in bathroom             Lower Body Dressing: Min guard;Sit to/from stand   Toilet Transfer: Min guard;Ambulation;Comfort height toilet   Toileting- Clothing Manipulation and Hygiene: Supervision/safety;Sit to/from stand Toileting - Clothing Manipulation Details (indicate cue type and reason): pt able to manage pants/underwear and gown without external assist     Functional mobility during ADLs: Min guard       Vision       Perception     Praxis      Cognition Arousal/Alertness: Awake/alert Behavior During Therapy: Flat affect Overall Cognitive Status: History of cognitive impairments - at baseline                                 General Comments: dementia at baseline        Exercises     Shoulder Instructions       General Comments spot checked O2 sats as pt not on continuous tele this AM, SpO2 >92% on RA    Pertinent Vitals/ Pain       Pain Assessment: No/denies pain  Home Living                                          Prior Functioning/Environment  Frequency  Min 2X/week        Progress Toward Goals  OT Goals(current goals can now be found in the care plan section)  Progress towards OT goals: Progressing toward goals  Acute Rehab OT Goals Patient Stated Goal: to go home OT Goal Formulation: With patient/family Time For Goal Achievement: 02/16/19 Potential to Achieve Goals: Good  Plan Discharge plan remains appropriate    Co-evaluation                 AM-PAC OT "6 Clicks" Daily Activity     Outcome Measure   Help from another person eating meals?: None Help from another person taking care of personal grooming?: A Little Help from another person toileting, which includes using toliet, bedpan, or urinal?: A Little Help from another person bathing  (including washing, rinsing, drying)?: A Little Help from another person to put on and taking off regular upper body clothing?: A Little Help from another person to put on and taking off regular lower body clothing?: A Little 6 Click Score: 19    End of Session    OT Visit Diagnosis: Unsteadiness on feet (R26.81);Muscle weakness (generalized) (M62.81);Other symptoms and signs involving cognitive function;Pain   Activity Tolerance Patient tolerated treatment well   Patient Left in bed;with call bell/phone within reach;with bed alarm set   Nurse Communication Mobility status        Time: [id number] OT Time Calculation (min): 20 min  Charges: OT General Charges $OT Visit: 1 Visit OT Treatments $Self Care/Home Management : 8-22 mins  Lou Cal, [place] Pager [phone] Office [phone]   Raymondo Band 02/09/2019, 9:22 AM

## 2019-02-09 NOTE — Progress Notes (Signed)
Patient not seen today.  Was discharged yesterday by physician but refused to leave hospital.  Spoke with NCM today and patient was going to be given a choice to go home vs discharge plan of SNF vs removal by [facility] police

## 2019-02-10 DIAGNOSIS — R05 Cough: Secondary | ICD-10-CM | POA: Diagnosis not present

## 2019-02-10 DIAGNOSIS — J1282 Pneumonia due to Coronavirus disease 2019: Secondary | ICD-10-CM | POA: Diagnosis not present

## 2019-02-10 DIAGNOSIS — U071 COVID-19: Secondary | ICD-10-CM | POA: Diagnosis not present

## 2019-02-10 LAB — GLUCOSE, CAPILLARY
Glucose-Capillary: 335 mg/dL — ABNORMAL HIGH (ref 70–99)
Glucose-Capillary: 164 mg/dL — ABNORMAL HIGH (ref 70–99)
Glucose-Capillary: 289 mg/dL — ABNORMAL HIGH (ref 70–99)

## 2019-02-10 NOTE — TOC Progression Note (Addendum)
Transition of Care [facility]) - Progression Note    Patient Details  Name: AVERIE DEVENNEY MRN: [id number] Date of Birth: May 21, 1950  Transition of Care [facility]) CM/SW Contact  Leeroy Cha, RN Phone Number: 02/10/2019, 8:21 AM  Clinical Narrative:    tct-[place] shelia robinson/message left message to  return call. FLOOR RN cRIBBS notified of pending transfer [place] form printed out on unit ptar call for 12 noon pickup at 1037. tct-home phone in epic spoke with [name] the daughter is aware that she needs to go sign papers for her mother and that patient is being transferred to [facility] this pm. Expected Discharge Plan: Skilled Nursing Facility Barriers to Discharge: SNF Pending bed offer  Expected Discharge Plan and Services Expected Discharge Plan: [place]   Discharge Planning Services: CM Consult Post Acute Care Choice: [place] Living arrangements for the past 2 months: Single Family Home Expected Discharge Date: 02/08/19                                     Social Determinants of Health (SDOH) Interventions    Readmission Risk Interventions No flowsheet data found.

## 2019-02-10 NOTE — Plan of Care (Signed)
Pt slept well during the night. No complaints of pain verbalized. Alert and oriented to person, place, and situation, forgetful at times.  Vitals stable on RA. Minimal assist with ADLs.  Planned for discharge today to [facility] No other issues, will monitor.   Problem: Coping: Goal: Psychosocial and spiritual needs will be supported Outcome: Progressing   Problem: Health Behavior/Discharge Planning: Goal: Ability to manage health-related needs will improve Outcome: Progressing   Problem: Safety: Goal: Ability to remain free from injury will improve Outcome: Progressing

## 2019-02-21 ENCOUNTER — Encounter (HOSPITAL_COMMUNITY): Payer: Self-pay | Admitting: Emergency Medicine

## 2019-02-21 ENCOUNTER — Emergency Department (HOSPITAL_COMMUNITY): Payer: Medicare Other

## 2019-02-21 ENCOUNTER — Inpatient Hospital Stay (HOSPITAL_COMMUNITY)
Admission: EM | Admit: 2019-02-21 | Discharge: 2019-03-03 | DRG: 871 | Disposition: A | Discharge status: Hospice | Payer: Medicare Other | Attending: Family Medicine | Admitting: Family Medicine

## 2019-02-21 ENCOUNTER — Other Ambulatory Visit: Payer: Self-pay

## 2019-02-21 DIAGNOSIS — N390 Urinary tract infection, site not specified: Secondary | ICD-10-CM | POA: Diagnosis not present

## 2019-02-21 DIAGNOSIS — E872 Acidosis: Secondary | ICD-10-CM | POA: Diagnosis present

## 2019-02-21 DIAGNOSIS — E11649 Type 2 diabetes mellitus with hypoglycemia without coma: Secondary | ICD-10-CM | POA: Diagnosis not present

## 2019-02-21 DIAGNOSIS — J189 Pneumonia, unspecified organism: Secondary | ICD-10-CM

## 2019-02-21 DIAGNOSIS — Z8701 Personal history of pneumonia (recurrent): Secondary | ICD-10-CM

## 2019-02-21 DIAGNOSIS — I10 Essential (primary) hypertension: Secondary | ICD-10-CM | POA: Diagnosis not present

## 2019-02-21 DIAGNOSIS — F17210 Nicotine dependence, cigarettes, uncomplicated: Secondary | ICD-10-CM | POA: Diagnosis not present

## 2019-02-21 DIAGNOSIS — R6520 Severe sepsis without septic shock: Secondary | ICD-10-CM | POA: Diagnosis not present

## 2019-02-21 DIAGNOSIS — E1165 Type 2 diabetes mellitus with hyperglycemia: Secondary | ICD-10-CM | POA: Diagnosis not present

## 2019-02-21 DIAGNOSIS — R509 Fever, unspecified: Secondary | ICD-10-CM | POA: Diagnosis not present

## 2019-02-21 DIAGNOSIS — J690 Pneumonitis due to inhalation of food and vomit: Secondary | ICD-10-CM | POA: Diagnosis not present

## 2019-02-21 DIAGNOSIS — Z79899 Other long term (current) drug therapy: Secondary | ICD-10-CM

## 2019-02-21 DIAGNOSIS — F209 Schizophrenia, unspecified: Secondary | ICD-10-CM | POA: Diagnosis present

## 2019-02-21 DIAGNOSIS — G893 Neoplasm related pain (acute) (chronic): Secondary | ICD-10-CM

## 2019-02-21 DIAGNOSIS — Z833 Family history of diabetes mellitus: Secondary | ICD-10-CM

## 2019-02-21 DIAGNOSIS — Z515 Encounter for palliative care: Secondary | ICD-10-CM | POA: Diagnosis not present

## 2019-02-21 DIAGNOSIS — F419 Anxiety disorder, unspecified: Secondary | ICD-10-CM | POA: Diagnosis not present

## 2019-02-21 DIAGNOSIS — F0390 Unspecified dementia without behavioral disturbance: Secondary | ICD-10-CM | POA: Diagnosis present

## 2019-02-21 DIAGNOSIS — Z8616 Personal history of covid-19: Secondary | ICD-10-CM | POA: Diagnosis not present

## 2019-02-21 DIAGNOSIS — E8770 Fluid overload, unspecified: Secondary | ICD-10-CM | POA: Diagnosis present

## 2019-02-21 DIAGNOSIS — Z880 Allergy status to penicillin: Secondary | ICD-10-CM | POA: Diagnosis not present

## 2019-02-21 DIAGNOSIS — Z7901 Long term (current) use of anticoagulants: Secondary | ICD-10-CM

## 2019-02-21 DIAGNOSIS — Z8249 Family history of ischemic heart disease and other diseases of the circulatory system: Secondary | ICD-10-CM

## 2019-02-21 DIAGNOSIS — Z7189 Other specified counseling: Secondary | ICD-10-CM

## 2019-02-21 DIAGNOSIS — I959 Hypotension, unspecified: Secondary | ICD-10-CM | POA: Diagnosis present

## 2019-02-21 DIAGNOSIS — C562 Malignant neoplasm of left ovary: Secondary | ICD-10-CM | POA: Diagnosis present

## 2019-02-21 DIAGNOSIS — Z66 Do not resuscitate: Secondary | ICD-10-CM | POA: Diagnosis not present

## 2019-02-21 DIAGNOSIS — Z885 Allergy status to narcotic agent status: Secondary | ICD-10-CM

## 2019-02-21 DIAGNOSIS — A419 Sepsis, unspecified organism: Secondary | ICD-10-CM | POA: Diagnosis not present

## 2019-02-21 DIAGNOSIS — R69 Illness, unspecified: Secondary | ICD-10-CM

## 2019-02-21 DIAGNOSIS — T380X5A Adverse effect of glucocorticoids and synthetic analogues, initial encounter: Secondary | ICD-10-CM | POA: Diagnosis not present

## 2019-02-21 DIAGNOSIS — R0902 Hypoxemia: Secondary | ICD-10-CM

## 2019-02-21 DIAGNOSIS — K5909 Other constipation: Secondary | ICD-10-CM | POA: Diagnosis present

## 2019-02-21 DIAGNOSIS — U071 COVID-19: Secondary | ICD-10-CM

## 2019-02-21 DIAGNOSIS — Z794 Long term (current) use of insulin: Secondary | ICD-10-CM

## 2019-02-21 DIAGNOSIS — J9601 Acute respiratory failure with hypoxia: Secondary | ICD-10-CM | POA: Diagnosis not present

## 2019-02-21 DIAGNOSIS — C569 Malignant neoplasm of unspecified ovary: Secondary | ICD-10-CM | POA: Diagnosis present

## 2019-02-21 LAB — COMPREHENSIVE METABOLIC PANEL
ALT: 12 U/L (ref 0–44)
AST: 10 U/L — ABNORMAL LOW (ref 15–41)
Albumin: 2.8 g/dL — ABNORMAL LOW (ref 3.5–5.0)
Alkaline Phosphatase: 68 U/L (ref 38–126)
Anion gap: 14 (ref 5–15)
BUN: 16 mg/dL (ref 8–23)
CO2: 15 mmol/L — ABNORMAL LOW (ref 22–32)
Calcium: 8.9 mg/dL (ref 8.9–10.3)
Chloride: 104 mmol/L (ref 98–111)
Creatinine, Ser: 0.56 mg/dL (ref 0.44–1.00)
GFR calc Af Amer: >60 mL/min (ref >60)
GFR calc non Af Amer: >60 mL/min (ref >60)
Glucose, Bld: 337 mg/dL — ABNORMAL HIGH (ref 70–99)
Potassium: 3 mmol/L — ABNORMAL LOW (ref 3.5–5.1)
Sodium: 133 mmol/L — ABNORMAL LOW (ref 135–145)
Total Bilirubin: 0.5 mg/dL (ref 0.3–1.2)
Total Protein: 6.1 g/dL — ABNORMAL LOW (ref 6.5–8.1)

## 2019-02-21 LAB — LACTIC ACID, PLASMA: Lactic Acid, Venous: 1.4 mmol/L (ref 0.5–1.9)

## 2019-02-21 LAB — URINALYSIS, ROUTINE W REFLEX MICROSCOPIC
Bilirubin Urine: NEGATIVE
Glucose, UA: >=500 mg/dL — AB
Hgb urine dipstick: NEGATIVE
Ketones, ur: 5 mg/dL — AB
Nitrite: NEGATIVE
Protein, ur: NEGATIVE mg/dL
Specific Gravity, Urine: 1.022 (ref 1.005–1.030)
WBC, UA: >50 WBC/hpf — ABNORMAL HIGH (ref 0–5)
pH: 5 (ref 5.0–8.0)

## 2019-02-21 LAB — CBC WITH DIFFERENTIAL/PLATELET
Abs Immature Granulocytes: 0.04 10*3/uL (ref 0.00–0.07)
Basophils Absolute: 0 10*3/uL (ref 0.0–0.1)
Basophils Relative: 0 %
Eosinophils Absolute: 0.3 10*3/uL (ref 0.0–0.5)
Eosinophils Relative: 3 %
HCT: 38.5 % (ref 36.0–46.0)
Hemoglobin: 12 g/dL (ref 12.0–15.0)
Immature Granulocytes: 0 %
Lymphocytes Relative: 12 %
Lymphs Abs: 1.2 10*3/uL (ref 0.7–4.0)
MCH: 28.2 pg (ref 26.0–34.0)
MCHC: 31.2 g/dL (ref 30.0–36.0)
MCV: 90.6 fL (ref 80.0–100.0)
Monocytes Absolute: 0.8 10*3/uL (ref 0.1–1.0)
Monocytes Relative: 8 %
Neutro Abs: 8.2 10*3/uL — ABNORMAL HIGH (ref 1.7–7.7)
Neutrophils Relative %: 77 %
Platelets: 303 10*3/uL (ref 150–400)
RBC: 4.25 MIL/uL (ref 3.87–5.11)
RDW: 17.9 % — ABNORMAL HIGH (ref 11.5–15.5)
WBC: 10.6 10*3/uL — ABNORMAL HIGH (ref 4.0–10.5)
nRBC: 0 % (ref 0.0–0.2)

## 2019-02-21 LAB — TSH: TSH: 0.618 u[IU]/mL (ref 0.350–4.500)

## 2019-02-21 LAB — HEMOGLOBIN A1C
Hgb A1c MFr Bld: 8.5 % — ABNORMAL HIGH (ref 4.8–5.6)
Mean Plasma Glucose: 197.25 mg/dL

## 2019-02-21 LAB — MRSA PCR SCREENING: MRSA by PCR: NEGATIVE

## 2019-02-21 LAB — PROCALCITONIN: Procalcitonin: <0.1 ng/mL

## 2019-02-21 LAB — FIBRINOGEN: Fibrinogen: 602 mg/dL — ABNORMAL HIGH (ref 210–475)

## 2019-02-21 LAB — BRAIN NATRIURETIC PEPTIDE: B Natriuretic Peptide: 31.4 pg/mL (ref 0.0–100.0)

## 2019-02-21 LAB — LACTATE DEHYDROGENASE: LDH: 146 U/L (ref 98–192)

## 2019-02-21 LAB — TROPONIN I (HIGH SENSITIVITY)
Troponin I (High Sensitivity): <2 ng/L (ref <18)
Troponin I (High Sensitivity): <2 ng/L (ref <18)

## 2019-02-21 LAB — C-REACTIVE PROTEIN: CRP: 9.5 mg/dL — ABNORMAL HIGH (ref <1.0)

## 2019-02-21 LAB — D-DIMER, QUANTITATIVE: D-Dimer, Quant: 1.6 ug/mL-FEU — ABNORMAL HIGH (ref 0.00–0.50)

## 2019-02-21 LAB — TRIGLYCERIDES: Triglycerides: 112 mg/dL (ref <150)

## 2019-02-21 LAB — CBG MONITORING, ED: Glucose-Capillary: 240 mg/dL — ABNORMAL HIGH (ref 70–99)

## 2019-02-21 LAB — GLUCOSE, CAPILLARY: Glucose-Capillary: 271 mg/dL — ABNORMAL HIGH (ref 70–99)

## 2019-02-21 LAB — FERRITIN: Ferritin: 162 ng/mL (ref 11–307)

## 2019-02-21 MED ORDER — SODIUM CHLORIDE 0.9 % IV SOLN
1.0000 g | INTRAVENOUS | Status: DC
  Administered 2019-02-22 – 2019-02-25 (×4): 1 g via INTRAVENOUS
  Filled 2019-02-21: qty 10
  Filled 2019-02-21: qty 1
  Filled 2019-02-21: qty 10
  Filled 2019-02-21 (×2): qty 1

## 2019-02-21 MED ORDER — ADULT MULTIVITAMIN W/MINERALS CH
1.0000 | ORAL_TABLET | Freq: Every day | ORAL | Status: DC
  Administered 2019-02-22 – 2019-03-03 (×10): 1 via ORAL
  Filled 2019-02-21 (×10): qty 1

## 2019-02-21 MED ORDER — INSULIN ASPART 100 UNIT/ML ~~LOC~~ SOLN
0.0000 [IU] | Freq: Every day | SUBCUTANEOUS | Status: DC
  Administered 2019-02-21: 3 [IU] via SUBCUTANEOUS
  Administered 2019-02-22 – 2019-02-24 (×2): 2 [IU] via SUBCUTANEOUS
  Administered 2019-02-25: 4 [IU] via SUBCUTANEOUS
  Administered 2019-02-27: 5 [IU] via SUBCUTANEOUS
  Administered 2019-02-28 – 2019-03-01 (×2): 2 [IU] via SUBCUTANEOUS

## 2019-02-21 MED ORDER — LETROZOLE 2.5 MG PO TABS
2.5000 mg | ORAL_TABLET | Freq: Every day | ORAL | Status: DC
  Administered 2019-02-22 – 2019-03-03 (×9): 2.5 mg via ORAL
  Filled 2019-02-21 (×10): qty 1

## 2019-02-21 MED ORDER — MEMANTINE HCL 5 MG PO TABS
5.0000 mg | ORAL_TABLET | Freq: Two times a day (BID) | ORAL | Status: DC
  Administered 2019-02-21 – 2019-03-03 (×20): 5 mg via ORAL
  Filled 2019-02-21 (×23): qty 1

## 2019-02-21 MED ORDER — SODIUM CHLORIDE 0.9 % IV BOLUS (SEPSIS)
500.0000 mL | Freq: Once | INTRAVENOUS | Status: AC
  Administered 2019-02-21: 15:00:00 500 mL via INTRAVENOUS

## 2019-02-21 MED ORDER — BUPROPION HCL 75 MG PO TABS: 150.0000 mg | ORAL_TABLET | Freq: Every day | ORAL | Status: DC

## 2019-02-21 MED ORDER — APIXABAN 2.5 MG PO TABS: 2.5000 mg | ORAL_TABLET | Freq: Two times a day (BID) | ORAL | Status: DC

## 2019-02-21 MED ORDER — DIAZEPAM 5 MG PO TABS
5.0000 mg | ORAL_TABLET | Freq: Three times a day (TID) | ORAL | Status: DC | PRN
  Administered 2019-02-21 – 2019-02-26 (×3): 5 mg via ORAL
  Filled 2019-02-21 (×4): qty 1

## 2019-02-21 MED ORDER — BACLOFEN 10 MG PO TABS
10.0000 mg | ORAL_TABLET | Freq: Every day | ORAL | Status: DC | PRN
  Filled 2019-02-21: qty 1

## 2019-02-21 MED ORDER — LETROZOLE 2.5 MG PO TABS: 2.5000 mg | ORAL_TABLET | Freq: Every day | ORAL | Status: DC

## 2019-02-21 MED ORDER — LISINOPRIL 10 MG PO TABS
10.0000 mg | ORAL_TABLET | Freq: Every day | ORAL | Status: DC
  Administered 2019-02-22 – 2019-02-23 (×2): 10 mg via ORAL
  Filled 2019-02-21 (×2): qty 1

## 2019-02-21 MED ORDER — CYCLOBENZAPRINE HCL 10 MG PO TABS
5.0000 mg | ORAL_TABLET | Freq: Every day | ORAL | Status: DC
  Administered 2019-02-22 – 2019-03-03 (×10): 5 mg via ORAL
  Filled 2019-02-21 (×10): qty 1

## 2019-02-21 MED ORDER — QUETIAPINE FUMARATE 100 MG PO TABS
100.0000 mg | ORAL_TABLET | Freq: Two times a day (BID) | ORAL | Status: DC | PRN
  Administered 2019-02-22: 100 mg via ORAL
  Filled 2019-02-21 (×2): qty 1
  Filled 2019-02-21: qty 4
  Filled 2019-02-21: qty 1

## 2019-02-21 MED ORDER — SENNA 8.6 MG PO TABS
1.0000 | ORAL_TABLET | Freq: Two times a day (BID) | ORAL | Status: DC
  Administered 2019-02-21 – 2019-03-03 (×18): 8.6 mg via ORAL
  Filled 2019-02-21 (×18): qty 1

## 2019-02-21 MED ORDER — BUPROPION HCL 75 MG PO TABS
150.0000 mg | ORAL_TABLET | Freq: Every day | ORAL | Status: DC
  Administered 2019-02-22 – 2019-03-03 (×10): 150 mg via ORAL
  Filled 2019-02-21 (×10): qty 2

## 2019-02-21 MED ORDER — ENOXAPARIN SODIUM 40 MG/0.4ML ~~LOC~~ SOLN
40.0000 mg | SUBCUTANEOUS | Status: DC
  Administered 2019-02-21 – 2019-02-28 (×6): 40 mg via SUBCUTANEOUS
  Filled 2019-02-21 (×10): qty 0.4

## 2019-02-21 MED ORDER — SODIUM CHLORIDE 0.9 % IV SOLN
1000.0000 mL | INTRAVENOUS | Status: DC
  Administered 2019-02-21: 15:00:00 1000 mL via INTRAVENOUS

## 2019-02-21 MED ORDER — METFORMIN HCL 500 MG PO TABS
500.0000 mg | ORAL_TABLET | Freq: Every day | ORAL | Status: DC
  Administered 2019-02-22: 09:00:00 500 mg via ORAL
  Filled 2019-02-21: qty 1

## 2019-02-21 MED ORDER — VITAMIN D 50 MCG (2000 UT) PO TABS: 2000.0000 [IU] | ORAL_TABLET | Freq: Every day | ORAL | Status: DC

## 2019-02-21 MED ORDER — LACTULOSE 10 GM/15ML PO SOLN
10.0000 g | Freq: Every day | ORAL | Status: DC
  Administered 2019-02-22 – 2019-03-03 (×10): 10 g via ORAL
  Filled 2019-02-21 (×10): qty 15

## 2019-02-21 MED ORDER — LINACLOTIDE 145 MCG PO CAPS
290.0000 ug | ORAL_CAPSULE | Freq: Every day | ORAL | Status: DC
  Administered 2019-02-22 – 2019-03-02 (×9): 290 ug via ORAL
  Filled 2019-02-21 (×10): qty 2

## 2019-02-21 MED ORDER — VANCOMYCIN HCL 750 MG/150ML IV SOLN: 750.0000 mg | INTRAVENOUS | Status: DC

## 2019-02-21 MED ORDER — TEMAZEPAM 15 MG PO CAPS
15.0000 mg | ORAL_CAPSULE | Freq: Every day | ORAL | Status: DC
  Administered 2019-02-21 – 2019-03-02 (×10): 15 mg via ORAL
  Filled 2019-02-21 (×10): qty 1

## 2019-02-21 MED ORDER — LUBIPROSTONE 24 MCG PO CAPS: 24.0000 ug | ORAL_CAPSULE | Freq: Every day | ORAL | Status: DC

## 2019-02-21 MED ORDER — INSULIN ASPART 100 UNIT/ML ~~LOC~~ SOLN
0.0000 [IU] | Freq: Three times a day (TID) | SUBCUTANEOUS | Status: DC
  Administered 2019-02-21: 17:00:00 5 [IU] via SUBCUTANEOUS
  Administered 2019-02-22 (×2): 3 [IU] via SUBCUTANEOUS
  Administered 2019-02-23: 5 [IU] via SUBCUTANEOUS
  Administered 2019-02-23: 2 [IU] via SUBCUTANEOUS
  Administered 2019-02-24 (×3): 3 [IU] via SUBCUTANEOUS
  Administered 2019-02-25: 15 [IU] via SUBCUTANEOUS
  Administered 2019-02-25: 4 [IU] via SUBCUTANEOUS
  Administered 2019-02-26 (×3): 15 [IU] via SUBCUTANEOUS
  Administered 2019-02-27 (×2): 5 [IU] via SUBCUTANEOUS
  Administered 2019-02-27: 15 [IU] via SUBCUTANEOUS
  Administered 2019-02-28: 8 [IU] via SUBCUTANEOUS
  Administered 2019-02-28: 15 [IU] via SUBCUTANEOUS
  Administered 2019-03-01: 3 [IU] via SUBCUTANEOUS
  Administered 2019-03-01: 8 [IU] via SUBCUTANEOUS
  Administered 2019-03-02 (×3): 3 [IU] via SUBCUTANEOUS
  Administered 2019-03-03: 8 [IU] via SUBCUTANEOUS

## 2019-02-21 MED ORDER — SODIUM CHLORIDE 0.9 % IV SOLN
2.0000 g | Freq: Three times a day (TID) | INTRAVENOUS | Status: DC
  Administered 2019-02-21: 15:00:00 2 g via INTRAVENOUS
  Filled 2019-02-21: qty 2

## 2019-02-21 MED ORDER — LUBIPROSTONE 24 MCG PO CAPS
24.0000 ug | ORAL_CAPSULE | Freq: Every day | ORAL | Status: DC
  Administered 2019-02-22 – 2019-03-03 (×10): 24 ug via ORAL
  Filled 2019-02-21 (×10): qty 1

## 2019-02-21 MED ORDER — ONDANSETRON HCL 4 MG PO TABS
4.0000 mg | ORAL_TABLET | Freq: Three times a day (TID) | ORAL | Status: DC | PRN
  Filled 2019-02-21: qty 1

## 2019-02-21 MED ORDER — VANCOMYCIN HCL IN DEXTROSE 1-5 GM/200ML-% IV SOLN
1000.0000 mg | Freq: Once | INTRAVENOUS | Status: AC
  Administered 2019-02-21: 1000 mg via INTRAVENOUS
  Filled 2019-02-21: qty 200

## 2019-02-21 MED ORDER — VITAMIN D 25 MCG (1000 UNIT) PO TABS
2000.0000 [IU] | ORAL_TABLET | Freq: Every day | ORAL | Status: DC
  Administered 2019-02-22 – 2019-03-03 (×10): 2000 [IU] via ORAL
  Filled 2019-02-21 (×10): qty 2

## 2019-02-21 MED ORDER — SODIUM CHLORIDE 0.9 % IV SOLN: 2.0000 g | Freq: Once | INTRAVENOUS | Status: DC

## 2019-02-21 MED ORDER — FENTANYL 50 MCG/HR TD PT72
1.0000 | MEDICATED_PATCH | TRANSDERMAL | Status: DC
  Administered 2019-02-22 – 2019-03-02 (×5): 1 via TRANSDERMAL
  Filled 2019-02-21 (×7): qty 1

## 2019-02-21 MED ORDER — HYDROCODONE-ACETAMINOPHEN 5-325 MG PO TABS
1.0000 | ORAL_TABLET | ORAL | Status: DC | PRN
  Administered 2019-02-21 – 2019-02-26 (×4): 1 via ORAL
  Filled 2019-02-21 (×4): qty 1

## 2019-02-21 MED ORDER — LINACLOTIDE 145 MCG PO CAPS: 290.0000 ug | ORAL_CAPSULE | Freq: Every day | ORAL | Status: DC

## 2019-02-21 MED ORDER — SODIUM CHLORIDE 0.9 % IV SOLN: INTRAVENOUS | Status: AC

## 2019-02-21 MED ORDER — THIAMINE HCL 100 MG PO TABS
100.0000 mg | ORAL_TABLET | Freq: Every day | ORAL | Status: DC
  Administered 2019-02-22 – 2019-03-03 (×10): 100 mg via ORAL
  Filled 2019-02-21 (×10): qty 1

## 2019-02-21 MED ORDER — LACTULOSE 10 GM/15ML PO SOLN: 10.0000 g | Freq: Every day | ORAL | Status: DC

## 2019-02-21 MED ORDER — RISPERIDONE 0.25 MG PO TABS
0.2500 mg | ORAL_TABLET | Freq: Two times a day (BID) | ORAL | Status: DC | PRN
  Filled 2019-02-21: qty 1

## 2019-02-21 NOTE — Progress Notes (Addendum)
Patient arrived to the floor A x O x 2 on 3L via [place]. NAD noted. Patient educated on isolation precaution/room and call bell. IV wrapped. MD paged ({yellow mews) skin assessment completed. Patient assisted to bathroom and back to bed.

## 2019-02-21 NOTE — ED Notes (Addendum)
ED TO INPATIENT HANDOFF REPORT  ED Nurse Name and Phone #:   S Name/Age/Gender Kristen Colon 69 y.o. female Room/Bed: 019C/019C  Code Status   Code Status: Full Code  Home/SNF/Other Home Patient oriented to: self Is this baseline? Yes   Triage Complete: Triage complete  Chief Complaint Fever [R50.9]  Triage Note Per EMS: pt from [facility] with c/o tachycardia, tachypnea, and Temp of 105. EMS administered 1000mg  of Tylenol and 500ML NS PTA.    EMS vitals:  BP 98/60 HR 150-120 Spo2 100% on 3L RR 40 CBg 266    Allergies Allergies  Allergen Reactions  . Penicillins Other (See Comments)    Edema Did it involve swelling of the face/tongue/throat, SOB, or low BP? Unknown Did it involve sudden or severe rash/hives, skin peeling, or any reaction on the inside of your mouth or nose? Unknown Did you need to seek medical attention at a hospital or doctor's office? Unknown When did it last happen?unknown If all above answers are "NO", may proceed with cephalosporin use. Daughter was unaware of allergy  . 5-Alpha Reductase Inhibitors   . Percocet [Oxycodone-Acetaminophen] Itching    Level of Care/Admitting Diagnosis ED Disposition    ED Disposition Condition [facility] Area: [place] [100100]  Level of Care: Telemetry Medical [104]  I expect the patient will be discharged within 24 hours: Yes  LOW acuity---Tx typically complete <24 hrs---ACUTE conditions typically can be evaluated <24 hours---LABS likely to return to acceptable levels <24 hours---IS near functional baseline---EXPECTED to return to current living arrangement---NOT newly hypoxic: Does not meet criteria for 5C-Observation unit  Covid Evaluation: Confirmed COVID Positive  Diagnosis: Fever [344092]  Admitting Physician: Lequita Halt [id number]  Attending Physician: Lequita Halt [id number]       B Medical/Surgery History Past Medical History:  Diagnosis Date   . Anxiety   . Dementia ([place])   . Dyspnea   . Headache   . Hypertension   . 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 DIAGNOSTIC LAPAROSCOPY     pelvic mass Dr. Denman George 06-30-17  . LAPAROSCOPY N/A 06/30/2017   Procedure: LAPAROSCOPY DIAGNOSTIC WITH BIOPSIES;  Surgeon: Everitt Amber, MD;  Location: [facility] ORS;  Service: Gynecology;  Laterality: N/A;     A IV Location/Drains/Wounds Patient Lines/Drains/Airways Status   Active Line/Drains/Airways    Name:   Placement date:   Placement time:   Site:   Days:   Peripheral IV 02/21/19 Distal;Left;Posterior Forearm   02/21/19    1209    Forearm   less than 1   Incision - 3 Ports Abdomen 1: Left;Lateral 2: Right;Lateral 3: Mid;Upper   06/30/17    0802     601          Intake/Output Last 24 hours  Intake/Output Summary (Last 24 hours) at 02/21/2019 1836 Last data filed at 02/21/2019 1557 Gross per 24 hour  Intake 500 ml  Output --  Net 500 ml    Labs/Imaging Results for orders placed or performed during the hospital encounter of 02/21/19 (from the past 48 hour(s))  Lactic acid, plasma     Status: None   Collection Time: 02/21/19 12:35 PM  Result Value Ref Range   Lactic Acid, Venous 1.4 0.5 - 1.9 mmol/L    Comment: Performed at [facility], [address] [place], [place] [address]  CBC WITH DIFFERENTIAL  Status: Abnormal   Collection Time: 02/21/19 12:35 PM  Result Value Ref Range   WBC 10.6 (H) 4.0 - 10.5 K/uL   RBC 4.25 3.87 - 5.11 MIL/uL   Hemoglobin 12.0 12.0 - 15.0 g/dL   HCT 38.5 36.0 - 46.0 %   MCV 90.6 80.0 - 100.0 fL   MCH 28.2 26.0 - 34.0 pg   MCHC 31.2 30.0 - 36.0 g/dL   RDW 17.9 (H) 11.5 - 15.5 %   Platelets 303 150 - 400 K/uL   nRBC 0.0 0.0 - 0.2 %   Neutrophils Relative % 77 %   Neutro Abs 8.2 (H) 1.7 - 7.7 K/uL   Lymphocytes Relative 12 %   Lymphs Abs 1.2 0.7 - 4.0 K/uL   Monocytes Relative 8 %   Monocytes Absolute 0.8 0.1 - 1.0 K/uL   Eosinophils  Relative 3 %   Eosinophils Absolute 0.3 0.0 - 0.5 K/uL   Basophils Relative 0 %   Basophils Absolute 0.0 0.0 - 0.1 K/uL   Immature Granulocytes 0 %   Abs Immature Granulocytes 0.04 0.00 - 0.07 K/uL    Comment: Performed at [facility], [address] [place], [place] [address]  Comprehensive metabolic panel     Status: Abnormal   Collection Time: 02/21/19 12:35 PM  Result Value Ref Range   Sodium 133 (L) 135 - 145 mmol/L   Potassium 3.0 (L) 3.5 - 5.1 mmol/L   Chloride 104 98 - 111 mmol/L   CO2 15 (L) 22 - 32 mmol/L   Glucose, Bld 337 (H) 70 - 99 mg/dL   BUN 16 8 - 23 mg/dL   Creatinine, Ser 0.56 0.44 - 1.00 mg/dL   Calcium 8.9 8.9 - 10.3 mg/dL   Total Protein 6.1 (L) 6.5 - 8.1 g/dL   Albumin 2.8 (L) 3.5 - 5.0 g/dL   AST 10 (L) 15 - 41 U/L   ALT 12 0 - 44 U/L   Alkaline Phosphatase 68 38 - 126 U/L   Total Bilirubin 0.5 0.3 - 1.2 mg/dL   GFR calc non Af Amer >60 >60 mL/min   GFR calc Af Amer >60 >60 mL/min   Anion gap 14 5 - 15    Comment: Performed at [facility], [place] [address] [place], [place] [address]  D-dimer, quantitative     Status: Abnormal   Collection Time: 02/21/19 12:35 PM  Result Value Ref Range   D-Dimer, Quant 1.60 (H) 0.00 - 0.50 ug/mL-FEU    Comment: (NOTE) At the manufacturer cut-off of 0.50 ug/mL FEU, this assay has been documented to exclude PE with a sensitivity and negative predictive value of 97 to 99%.  At this time, this assay has not been approved by the FDA to exclude DVT/VTE. Results should be correlated with clinical presentation. Performed at [facility], [place] [address] [place], [place] [address]   Procalcitonin     Status: None   Collection Time: 02/21/19 12:35 PM  Result Value Ref Range   Procalcitonin <0.10 ng/mL    Comment:        Interpretation: PCT (Procalcitonin) <= 0.5 ng/mL: Systemic infection (sepsis) is not likely. Local bacterial infection is possible. (NOTE)       Sepsis PCT Algorithm           Lower  Respiratory Tract  Infection PCT Algorithm    ----------------------------     ----------------------------         PCT < 0.25 ng/mL                PCT < 0.10 ng/mL         Strongly encourage             Strongly discourage   discontinuation of antibiotics    initiation of antibiotics    ----------------------------     -----------------------------       PCT 0.25 - 0.50 ng/mL            PCT 0.10 - 0.25 ng/mL               OR       >80% decrease in PCT            Discourage initiation of                                            antibiotics      Encourage discontinuation           of antibiotics    ----------------------------     -----------------------------         PCT >= 0.50 ng/mL              PCT 0.26 - 0.50 ng/mL               AND        <80% decrease in PCT             Encourage initiation of                                             antibiotics       Encourage continuation           of antibiotics    ----------------------------     -----------------------------        PCT >= 0.50 ng/mL                  PCT > 0.50 ng/mL               AND         increase in PCT                  Strongly encourage                                      initiation of antibiotics    Strongly encourage escalation           of antibiotics                                     -----------------------------                                           PCT <= 0.25 ng/mL  OR                                        > 80% decrease in PCT                                     Discontinue / Do not initiate                                             antibiotics Performed at [facility], [place] [address] [place], [place] [address]   Lactate dehydrogenase     Status: None   Collection Time: 02/21/19 12:35 PM  Result Value Ref Range   LDH 146 98 - 192 U/L    Comment: Performed at [facility], [place] [address] [place], [place] [address]  Ferritin     Status: None   Collection Time: 02/21/19 12:35 PM  Result Value Ref Range   Ferritin 162 11 - 307 ng/mL    Comment: Performed at [facility], [place] [address], [place], [place] [address]  Triglycerides     Status: None   Collection Time: 02/21/19 12:35 PM  Result Value Ref Range   Triglycerides 112 <150 mg/dL    Comment: Performed at [place] [address] [place], [place] [address]  Fibrinogen     Status: Abnormal   Collection Time: 02/21/19 12:35 PM  Result Value Ref Range   Fibrinogen 602 (H) 210 - 475 mg/dL    Comment: Performed at [place] [address], [place], [place] [address]  C-reactive protein     Status: Abnormal   Collection Time: 02/21/19 12:35 PM  Result Value Ref Range   CRP 9.5 (H) <1.0 mg/dL    Comment: Performed at [facility], [place] [address] [place], [place] [address]  Brain natriuretic peptide     Status: None   Collection Time: 02/21/19 12:35 PM  Result Value Ref Range   B Natriuretic Peptide 31.4 0.0 - 100.0 pg/mL    Comment: Performed at [place] [address], [place], [place] [address]  Troponin I (High Sensitivity)     Status: None   Collection Time: 02/21/19 12:35 PM  Result Value Ref Range   Troponin I (High Sensitivity) <2 <18 ng/L    Comment: (NOTE) Elevated high sensitivity troponin I (hsTnI) values and significant  changes across serial measurements may suggest ACS but many other  chronic and acute conditions are known to elevate hsTnI results.  Refer to the Links section for chest pain algorithms and additional  guidance. Performed at [facility], [place] [address] [place], [place] [address]   Urinalysis, Routine w reflex microscopic     Status: Abnormal   Collection Time: 02/21/19  4:00 PM  Result Value Ref Range   Color, Urine YELLOW YELLOW   APPearance CLEAR CLEAR   Specific Gravity, Urine 1.022 1.005 - 1.030   pH 5.0 5.0 - 8.0   Glucose,  UA >=500 (A) NEGATIVE mg/dL   Hgb urine dipstick NEGATIVE NEGATIVE   Bilirubin Urine NEGATIVE NEGATIVE   Ketones, ur 5 (A) NEGATIVE mg/dL  Protein, ur NEGATIVE NEGATIVE mg/dL   Nitrite NEGATIVE NEGATIVE   Leukocytes,Ua LARGE (A) NEGATIVE   RBC / HPF 0-5 0 - 5 RBC/hpf   WBC, UA >50 (H) 0 - 5 WBC/hpf   Bacteria, UA FEW (A) NONE SEEN   Squamous Epithelial / LPF 0-5 0 - 5    Comment: Performed at [facility], [place] [address], [place], [place] [address]  Hemoglobin A1c     Status: Abnormal   Collection Time: 02/21/19  4:02 PM  Result Value Ref Range   Hgb A1c MFr Bld 8.5 (H) 4.8 - 5.6 %    Comment: (NOTE) Pre diabetes:          5.7%-6.4% Diabetes:              >6.4% Glycemic control for   <7.0% adults with diabetes    Mean Plasma Glucose 197.25 mg/dL    Comment: Performed at [place] [address] [place], [place] [address]  MRSA PCR Screening     Status: None   Collection Time: 02/21/19  4:18 PM   Specimen: Nasopharyngeal  Result Value Ref Range   MRSA by PCR NEGATIVE NEGATIVE    Comment:        The GeneXpert MRSA Assay (FDA approved for NASAL specimens only), is one component of a comprehensive MRSA colonization surveillance program. It is not intended to diagnose MRSA infection nor to guide or monitor treatment for MRSA infections. Performed at [facility], [place] [address] [place],  [place] [address]   Troponin I (High Sensitivity)     Status: None   Collection Time: 02/21/19  4:20 PM  Result Value Ref Range   Troponin I (High Sensitivity) <2 <18 ng/L    Comment: (NOTE) Elevated high sensitivity troponin I (hsTnI) values and significant  changes across serial measurements may suggest ACS but many other  chronic and acute conditions are known to elevate hsTnI results.  Refer to the Links section for chest pain algorithms and additional  guidance. Performed at [facility], [place] [address] [place], [place] [address]   TSH     Status:  None   Collection Time: 02/21/19  4:20 PM  Result Value Ref Range   TSH 0.618 0.350 - 4.500 uIU/mL    Comment: Performed by a 3rd Generation assay with a functional sensitivity of <=0.01 uIU/mL. Performed at [facility], [place] [address] [place], [place] [address]   CBG monitoring, ED     Status: Abnormal   Collection Time: 02/21/19  4:50 PM  Result Value Ref Range   Glucose-Capillary 240 (H) 70 - 99 mg/dL   DG Chest Port 1 View  Result Date: 02/21/2019 CLINICAL DATA:  Tachycardia, tachypnea, febrile EXAM: PORTABLE CHEST 1 VIEW COMPARISON:  01/27/2019, 04/14/2017 FINDINGS: Single frontal view of the chest demonstrates a stable cardiac silhouette. Interval progression of upper lobe predominant interstitial prominence, with mild perihilar ground-glass airspace disease. No effusion or pneumothorax. No acute bony abnormalities. IMPRESSION: 1. Increasing upper lobe predominant interstitial and perihilar ground-glass opacities. Findings could reflect interstitial edema or atypical viral pneumonia. Electronically Signed   By: Randa Ngo M.D.   On: 02/21/2019 13:00    Pending Labs Unresulted Labs (From admission, onward)    Start     Ordered   02/22/19 0500  CBC  Tomorrow morning,   R     02/21/19 1602   02/22/19 [id number]  Basic metabolic panel  Tomorrow morning,  R     02/21/19 1602   02/21/19 1235  Lactic acid, plasma  Now then every 2 hours,   STAT     02/21/19 1235   02/21/19 1235  Blood Culture (routine x 2)  BLOOD CULTURE X 2,   STAT     02/21/19 1235          Vitals/Pain Today's Vitals   02/21/19 1754 02/21/19 1755 02/21/19 1756 02/21/19 1757  BP:  108/66    Pulse: 99 (!) 102 99 100  Resp: 13 13 13 14   Temp:      TempSrc:      SpO2: (!) 87% 98% 98% 98%  PainSc:        Isolation Precautions Airborne and Contact precautions  Medications Medications  sodium chloride 0.9 % bolus 500 mL (0 mLs Intravenous Stopped 02/21/19 1557)    Followed by  0.9 %  sodium chloride  infusion (0 mLs Intravenous Stopped 02/21/19 1710)  ceFEPIme (MAXIPIME) 2 g in sodium chloride 0.9 % 100 mL IVPB (0 g Intravenous Stopped 02/21/19 1558)  vancomycin (VANCOREADY) IVPB 750 mg/150 mL (has no administration in time range)  HYDROcodone-acetaminophen (NORCO/VICODIN) 5-325 MG per tablet 1 tablet (1 tablet Oral Given 02/21/19 1705)  fentaNYL (DURAGESIC) 50 MCG/HR 1 patch (has no administration in time range)  letrozole [facility]) tablet 2.5 mg (has no administration in time range)  lisinopril (ZESTRIL) tablet 10 mg (has no administration in time range)  buPROPion (WELLBUTRIN) tablet 150 mg (has no administration in time range)  diazepam (VALIUM) tablet 5 mg (5 mg Oral Given 02/21/19 1705)  memantine (NAMENDA) tablet 5 mg (has no administration in time range)  QUEtiapine (SEROQUEL) tablet 100 mg (has no administration in time range)  risperiDONE (RISPERDAL) tablet 0.25 mg (has no administration in time range)  temazepam (RESTORIL) capsule 15 mg (has no administration in time range)  metFORMIN (GLUCOPHAGE) tablet 500 mg (has no administration in time range)  lactulose (CHRONULAC) 10 GM/15ML solution 10 g (has no administration in time range)  linaclotide (LINZESS) capsule 290 mcg (has no administration in time range)  lubiprostone (AMITIZA) capsule 24 mcg (has no administration in time range)  ondansetron (ZOFRAN) tablet 4 mg (has no administration in time range)  senna (SENOKOT) tablet 8.6 mg (has no administration in time range)  apixaban (ELIQUIS) tablet 2.5 mg (has no administration in time range)  baclofen (LIORESAL) tablet 10 mg (has no administration in time range)  cyclobenzaprine (FLEXERIL) tablet 5 mg (has no administration in time range)  Vitamin D 2,000 Units (has no administration in time range)  multivitamin with minerals tablet 1 tablet (has no administration in time range)  thiamine tablet 100 mg (has no administration in time range)  0.9 %  sodium chloride infusion (has no  administration in time range)  insulin aspart (novoLOG) injection 0-15 Units (5 Units Subcutaneous Given 02/21/19 1711)  insulin aspart (novoLOG) injection 0-5 Units (has no administration in time range)  vancomycin (VANCOCIN) IVPB 1000 mg/200 mL premix (0 mg Intravenous Stopped 02/21/19 1710)    Mobility walks with device High fall risk   Focused Assessments Pulmonary Assessment Handoff:  Lung sounds: L Breath Sounds: Diminished R Breath Sounds: Diminished O2 Device: Nasal Cannula O2 Flow Rate (L/min): 2 L/min      R Recommendations: See Admitting Provider Note  Report given to:   Additional Notes: Chest x-ray shows some increasing upper lobe infiltrates.  With patient being managed for suspected sepsis will opt to treat for PNA. Admission  for SIRS/Possible sepsis with suspected source of secondary bacterial pneumonia.  Pt ambulates with walker and assistance per staff at [facility].

## 2019-02-21 NOTE — ED Notes (Signed)
Per pt's daughter pt is NOT returning to [facility] so do not contact them and the patient also does not eat pork for religious reasons

## 2019-02-21 NOTE — ED Triage Notes (Signed)
Per EMS: pt from [facility] with c/o tachycardia, tachypnea, and Temp of 105. EMS administered 1000mg  of Tylenol and 500ML NS PTA.    EMS vitals:  BP 98/60 HR 150-120 Spo2 100% on 3L RR 40 CBg 266

## 2019-02-21 NOTE — ED Notes (Addendum)
Pt pulled out IV line, removed all monitoring equipment, and being  verbally abusive.   Medicated patient and placed order for sitter.

## 2019-02-21 NOTE — ED Provider Notes (Signed)
  [place] EMERGENCY DEPARTMENT Provider Note   CSN: [id number] Arrival date & time: 02/21/19  1149     History No chief complaint on file.   Kristen Colon is a 69 y.o. female.  HPI There is a complete dictated note for this encounter.  I am unable to delete this duplicate open note.  Disregard this open note.    Charlesetta Shanks, MD 02/28/19 [phone]

## 2019-02-21 NOTE — Progress Notes (Signed)
Pharmacy Antibiotic Note  Kristen Colon is a 69 y.o. female admitted on 02/21/2019 with pneumonia, recently hospitalized with COVID-19 infection and discharged 02/08/2019.  Pharmacy has been consulted for vancomycin and cefepime dosing.  Plan: Vancomycin 1g IV x 1, then 750 mg IV every 24 hours Goal AUC 400-550. Expected AUC: 435 SCr used: 0.8 Cefepime 2g IV every 8 hours Add MRSA PCR Monitor renal function, Cx/PCR and clinical progression to narrow Vancomycin levels at steady state      Temp (24hrs), Avg:98.3 F (36.8 C), Min:98.3 F (36.8 C), Max:98.3 F (36.8 C)  Recent Labs  Lab 02/21/19 1235  WBC 10.6*  CREATININE 0.56  LATICACIDVEN 1.4    CrCl cannot be calculated (Unknown ideal weight.).    Allergies  Allergen Reactions  . Penicillins Other (See Comments)    Edema Did it involve swelling of the face/tongue/throat, SOB, or low BP? Unknown Did it involve sudden or severe rash/hives, skin peeling, or any reaction on the inside of your mouth or nose? Unknown Did you need to seek medical attention at a hospital or doctor's office? Unknown When did it last happen?unknown If all above answers are "NO", may proceed with cephalosporin use. Daughter was unaware of allergy  . 5-Alpha Reductase Inhibitors   . Percocet [Oxycodone-Acetaminophen] Itching    Bertis Ruddy, PharmD Clinical Pharmacist Please check AMION for all [place] numbers 02/21/2019 2:44 PM

## 2019-02-21 NOTE — H&P (Signed)
History and Physical    Kristen Colon [id number] DOB: Jun 23, 1950 DOA: 02/21/2019  PCP: Everardo Beals, NP   Patient coming from: SNF  I have personally briefly reviewed patient's old medical records in [place]  Chief Complaint: Tachy, fever  HPI: Kristen Colon is a 69 y.o. female with medical history significant of  hypertension, dm2, schizophrenia, dementia, anxiety, recently covid-19Covid who was a SNF resident and was sent for evaluation of fever and tachycardia.  She was discharged [phone] to SNF.    I tried to talk to patient's SNF nurse regarding patient condition, and phone calls was interrupted x2. SNF nurse saw patient this morning reported patient did not have any fever or any complaining of dysuria.  But according to ED physician obtained report from EMS, and was tachycardia and spiked fever of 105 F, and 1 dose of 1000 mg Tylenol was given.  EMS vital signs were blood pressure 98/60 with heart rate from 120-150, 100% on 3 L nasal cannula oxygen with a respiratory rate of 40.  Pt is demented particularly denied any dysuria, no abdominal or flank pain.  ED vital signs showed patient does not have a fever.  Patient's daughter said she did not find the patient had any fever this morning when she was at patient, the daughter further complaining about the patient's shoulder was not under control for patient was discharged on Decadron, and her sugar this morning was 590. ED Course: No fever, no WBC count.  Review of Systems: Unable to perform, pt demented  Past Medical History:  Diagnosis Date  . Anxiety   . Dementia ([place])   . Dyspnea   . Headache   . Hypertension   . Schizophrenia ([place])    paranoid    Past Surgical History:  Procedure Laterality Date  . DIAGNOSTIC LAPAROSCOPY     pelvic mass Dr. Denman George 06-30-17  . LAPAROSCOPY N/A 06/30/2017   Procedure: LAPAROSCOPY DIAGNOSTIC WITH BIOPSIES;  Surgeon: Everitt Amber, MD;  Location: [facility];  Service: Gynecology;   Laterality: N/A;     reports that she has been smoking cigarettes. She has a 5.00 pack-year smoking history. She has never used smokeless tobacco. She reports previous alcohol use. She reports previous drug use. Drug: Marijuana.  Allergies  Allergen Reactions  . Penicillins Other (See Comments)    Edema Did it involve swelling of the face/tongue/throat, SOB, or low BP? Unknown Did it involve sudden or severe rash/hives, skin peeling, or any reaction on the inside of your mouth or nose? Unknown Did you need to seek medical attention at a hospital or doctor's office? Unknown When did it last happen?unknown If all above answers are "NO", may proceed with cephalosporin use. Daughter was unaware of allergy  . 5-Alpha Reductase Inhibitors   . Percocet [Oxycodone-Acetaminophen] Itching    Family History  Problem Relation Age of Onset  . Heart disease Mother   . Diabetes Sister      Prior to Admission medications   Medication Sig Start Date End Date Taking? Authorizing Provider  acetaminophen (TYLENOL) 500 MG tablet Take 500 mg by mouth every 8 (eight) hours as needed for mild pain.   Yes [provider]  amLODipine (NORVASC) 5 MG tablet Take 1 tablet (5 mg total) by mouth daily. 09/24/16  Yes Rai, Ripudeep K, MD  apixaban (ELIQUIS) 2.5 MG TABS tablet Take 2.5 mg by mouth 2 (two) times daily.   Yes [provider]  Ascorbic Acid (VITAMIN C) 500 MG  CAPS Take 1,000 mg by mouth daily.    Yes [provider]  baclofen (LIORESAL) 10 MG tablet Take 10 mg by mouth daily as needed for muscle spasms.   Yes [provider]  buPROPion (WELLBUTRIN) 75 MG tablet Take 150 mg by mouth daily.   Yes [provider]  Cholecalciferol (VITAMIN D) 50 MCG (2000 UT) tablet Take 2,000 Units by mouth daily.   Yes [provider]  cyclobenzaprine (FLEXERIL) 5 MG tablet Take 5 mg by mouth daily. 12/10/18  Yes [provider]  diazepam (VALIUM) 5 MG  tablet Take 5 mg by mouth every 8 (eight) hours as needed for anxiety.   Yes [provider]  fentaNYL (DURAGESIC) 50 MCG/HR Place 1 patch onto the skin as directed. Every 48 hours 12/07/18  Yes [provider]  HYDROcodone-acetaminophen (NORCO/VICODIN) 5-325 MG tablet Take 1 tablet by mouth every 4 (four) hours as needed for moderate pain.   Yes [provider]  insulin lispro (HUMALOG) 100 UNIT/ML injection Inject 2-10 Units into the skin See admin instructions. Sliding scale: 150-200=2u, 201-250=4u, 251-300=6u, 301-350=8u, 251-400=10u, >=400 call MD   Yes [provider]  INVEGA SUSTENNA 156 MG/ML SUSP injection Inject 1 mL (156 mg total) into the muscle every 30 (thirty) days. 09/05/14  Yes Dorie Rank, MD  lactulose [facility]) 10 GM/15ML solution Take 10 g by mouth daily.  08/12/16  Yes [provider]  letrozole (FEMARA) 2.5 MG tablet Take 2.5 mg by mouth daily.   Yes [provider]  linaclotide (LINZESS) 290 MCG CAPS capsule Take 1 capsule (290 mcg total) by mouth daily. 04/16/17 02/21/19 Yes Arrien, Jimmy Picket, MD  lisinopril (PRINIVIL,ZESTRIL) 10 MG tablet Take 10 mg by mouth daily.    Yes [provider]  lubiprostone (AMITIZA) 24 MCG capsule Take 24 mcg by mouth daily.   Yes [provider]  memantine (NAMENDA) 5 MG tablet Take 5 mg by mouth 2 (two) times daily.   Yes [provider]  metFORMIN (GLUCOPHAGE) 500 MG tablet Take 500 mg by mouth daily with breakfast.    Yes [provider]  Multiple Vitamin (MULTIVITAMIN WITH MINERALS) TABS tablet Take 1 tablet by mouth daily.   Yes [provider]  ondansetron (ZOFRAN) 4 MG tablet Take 4 mg by mouth every 8 (eight) hours as needed for nausea or vomiting.   Yes [provider]  QUEtiapine (SEROQUEL) 100 MG tablet Take 100 mg by mouth every 12 (twelve) hours as needed (sleep/agitation).  12/27/18  Yes [provider]  risperiDONE  (RISPERDAL) 0.25 MG tablet Take 0.25 mg by mouth 2 (two) times daily as needed (agitation).    Yes [provider]  senna (SENOKOT) 8.6 MG tablet Take 1 tablet by mouth 2 (two) times daily.   Yes [provider]  temazepam (RESTORIL) 15 MG capsule Take 15 mg by mouth at bedtime.   Yes [provider]  thiamine 100 MG tablet Take 1 tablet (100 mg total) by mouth daily. 09/24/16  Yes Rai, Ripudeep K, MD  zinc sulfate 220 (50 Zn) MG capsule Take 220 mg by mouth daily.   Yes [provider]    Physical Exam: Vitals:   02/21/19 1756 02/21/19 1757 02/21/19 1830 02/21/19 1900  BP:   121/68 114/73  Pulse: 99 100 (!) 102 99  Resp: 13 14 14 13   Temp:      TempSrc:      SpO2: 98% 98% 97% 95%  Constitutional: NAD, calm, comfortable Vitals:   02/21/19 1756 02/21/19 1757 02/21/19 1830 02/21/19 1900  BP:   121/68 114/73  Pulse: 99 100 (!) 102 99  Resp: 13 14 14 13   Temp:      TempSrc:      SpO2: 98% 98% 97% 95%   Eyes: PERRL, lids and conjunctivae normal ENMT: Mucous membranes are moist. Posterior pharynx clear of any exudate or lesions.Normal dentition.  Neck: normal, supple, no masses, no thyromegaly Respiratory: clear to auscultation bilaterally, no wheezing, no crackles. Normal respiratory effort. No accessory muscle use.  Cardiovascular: Regular rate and rhythm, no murmurs / rubs / gallops. No extremity edema. 2+ pedal pulses. No carotid bruits.  Abdomen: no tenderness, no masses palpated. No hepatosplenomegaly. Bowel sounds positive.  Musculoskeletal: no clubbing / cyanosis. No joint deformity upper and lower extremities. Good ROM, no contractures. Normal muscle tone.  Skin: no rashes, lesions, ulcers. No induration Neurologic: Following simple commands.  Psychiatric: Demented.    Labs on Admission: I have personally reviewed following labs and imaging studies  CBC: Recent Labs  Lab 02/21/19 1235  WBC 10.6*  NEUTROABS 8.2*  HGB 12.0  HCT  38.5  MCV 90.6  PLT [id number]   Basic Metabolic Panel: Recent Labs  Lab 02/21/19 1235  NA 133*  K 3.0*  CL 104  CO2 15*  GLUCOSE 337*  BUN 16  CREATININE 0.56  CALCIUM 8.9   GFR: CrCl cannot be calculated (Unknown ideal weight.). Liver Function Tests: Recent Labs  Lab 02/21/19 1235  AST 10*  ALT 12  ALKPHOS 68  BILITOT 0.5  PROT 6.1*  ALBUMIN 2.8*   No results for input(s): LIPASE, AMYLASE in the last 168 hours. No results for input(s): AMMONIA in the last 168 hours. Coagulation Profile: No results for input(s): INR, PROTIME in the last 168 hours. Cardiac Enzymes: No results for input(s): CKTOTAL, CKMB, CKMBINDEX, TROPONINI in the last 168 hours. BNP (last 3 results) No results for input(s): PROBNP in the last 8760 hours. HbA1C: Recent Labs    02/21/19 1602  HGBA1C 8.5*   CBG: Recent Labs  Lab 02/21/19 1650  GLUCAP 240*   Lipid Profile: Recent Labs    02/21/19 1235  TRIG 112   Thyroid Function Tests: Recent Labs    02/21/19 1620  TSH 0.618   Anemia Panel: Recent Labs    02/21/19 1235  FERRITIN 162   Urine analysis:    Component Value Date/Time   COLORURINE YELLOW 02/21/2019 1600   APPEARANCEUR CLEAR 02/21/2019 1600   LABSPEC 1.022 02/21/2019 1600   PHURINE 5.0 02/21/2019 1600   GLUCOSEU >=500 (A) 02/21/2019 1600   HGBUR NEGATIVE 02/21/2019 1600   BILIRUBINUR NEGATIVE 02/21/2019 1600   KETONESUR 5 (A) 02/21/2019 1600   PROTEINUR NEGATIVE 02/21/2019 1600   UROBILINOGEN 0.2 10/23/2014 1102   NITRITE NEGATIVE 02/21/2019 1600   LEUKOCYTESUR LARGE (A) 02/21/2019 1600    Radiological Exams on Admission: DG Chest Port 1 View  Result Date: 02/21/2019 CLINICAL DATA:  Tachycardia, tachypnea, febrile EXAM: PORTABLE CHEST 1 VIEW COMPARISON:  01/27/2019, 04/14/2017 FINDINGS: Single frontal view of the chest demonstrates a stable cardiac silhouette. Interval progression of upper lobe predominant interstitial prominence, with mild perihilar  ground-glass airspace disease. No effusion or pneumothorax. No acute bony abnormalities. IMPRESSION: 1. Increasing upper lobe predominant interstitial and perihilar ground-glass opacities. Findings could reflect interstitial edema or atypical viral pneumonia. Electronically Signed   By: Randa Ngo M.D.   On: 02/21/2019 13:00    EKG:  Independently reviewed.   Assessment/Plan Active Problems:   Fever  Fever likely has a UTI Discontinue vancomycin and cefepime, start ceftriaxone. Follow urine culture. No signs of pneumonia or diarrhea.  Poorly controlled diabetes, Continue Metformin and insulin, he has completed steroid for Covid infection.  Hypertension, Continue home regimen.  Chronic constipation, Continue home bowel regimen  Dementia, Continue home regimen  DVT prophylaxis: Lovenox Code Status: Full Code Family Communication: Daughter Disposition Plan: Family not satisfied with her current SNF selection, inform transition care team Consults called: None Admission status: Tele obs   Lequita Halt MD [facility] Pager [phone]  If 7PM-7AM, please contact night-coverage [email or web address] Password [facility]  02/21/2019, 7:53 PM

## 2019-02-21 NOTE — ED Notes (Addendum)
Notified MD pt pulling out IV lines, removing monitoring equipment, and refusing to wear oxygen.

## 2019-02-21 NOTE — ED Notes (Signed)
Sort RN: Pts daughter here to give information about the patient. She reports that she is Covid-19 negative and knows patients status of dementia and covid. She agrees to be contacted via telephone by staff and EDP for plan of care, she is now going home. Her mother has a cell phone at bedside, she is able to operate but may need some help.  Cindy RN notified.

## 2019-02-21 NOTE — ED Provider Notes (Signed)
[place] EMERGENCY DEPARTMENT Provider Note   CSN: [id number] Arrival date & time: 02/21/19  1149     History No chief complaint on file.   Kristen Colon is a 69 y.o. female.  HPI Patient has a history of recent hospitalization for Covid.  She was discharged [phone] to SNF.  Reportedly patient is heart rate and temperature went up significantly today.  The report is for a temperature of 105.  Patient is reported to have been tachypneic.  EMS vital signs were blood pressure 98/60 with heart rate from 120-150, 100% on 3 L nasal cannula oxygen with a respiratory rate of 40.  Patient does not have significant focal complaints.  There is report of dementia.  Unclear if this is significantly impacting her ability to relay symptoms.  She is denying any headache or chest pain.  She denies feeling short of breath.  She mildly endorses abdominal discomfort.  She reports she is not really sure why she had to come to the hospital.  Patient is pleasant and cooperative but recall may be compromised.    Past Medical History:  Diagnosis Date  . Anxiety   . Dementia ([place])   . Dyspnea   . Headache   . Hypertension   . Schizophrenia [facility])    paranoid    Patient Active Problem List   Diagnosis Date Noted  . Pneumonia due to COVID-19 virus 01/29/2019  . Acute respiratory failure with hypoxia ([place]) 01/27/2019  . COVID-19 virus infection 01/27/2019  . Pelvic mass in female 06/30/2017  . 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 Elevated CA-125 05/12/2017  . Peritoneal carcinomatosis ([place]) 05/12/2017  . 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 Ovarian mass 09/20/2016  . Non-intractable vomiting with nausea 09/20/2016  . Hypertension, uncontrolled 09/20/2016  . Diabetes mellitus type 2 in nonobese ([place]) 09/20/2016  . Acute encephalopathy 09/19/2016  . Schizoaffective disorder, chronic condition with acute exacerbation ([place])   . Schizophrenia, undifferentiated ([place])  06/21/2014  . Delusional disorder ([place])   . Psychoses ([place])   . Gallbladder polyp 07/21/2011  . Liver mass 07/21/2011    Past Surgical History:  Procedure Laterality Date  . DIAGNOSTIC LAPAROSCOPY     pelvic mass Dr. Denman George 06-30-17  . LAPAROSCOPY N/A 06/30/2017   Procedure: LAPAROSCOPY DIAGNOSTIC WITH BIOPSIES;  Surgeon: Everitt Amber, MD;  Location: [facility];  Service: Gynecology;  Laterality: N/A;     OB History   No obstetric history on file.    Obstetric Comments  Pt is unsure how many times she has been pregnant and do not know how many children because she did not raise any of them.        Family History  Problem Relation Age of Onset  . Heart disease Mother   . Diabetes Sister     Social History   Tobacco Use  . Smoking status: Current Every Day Smoker    Packs/day: 1.00    Years: 5.00    Pack years: 5.00    Types: Cigarettes  . Smokeless tobacco: Never Used  Substance Use Topics  . Alcohol use: Not Currently  . Drug use: Not Currently    Types: Marijuana    Home Medications Prior to Admission medications   Medication Sig Start Date End Date Taking? Authorizing Provider  acetaminophen (TYLENOL) 500 MG tablet Take 500 mg by mouth every 8 (eight) hours as needed for mild pain.   Yes [provider]  amLODipine (NORVASC) 5 MG tablet Take  1 tablet (5 mg total) by mouth daily. 09/24/16  Yes Rai, Ripudeep K, MD  apixaban (ELIQUIS) 2.5 MG TABS tablet Take 2.5 mg by mouth 2 (two) times daily.   Yes [provider]  Ascorbic Acid (VITAMIN C) 500 MG CAPS Take 1,000 mg by mouth daily.    Yes [provider]  baclofen (LIORESAL) 10 MG tablet Take 10 mg by mouth daily as needed for muscle spasms.   Yes [provider]  buPROPion (WELLBUTRIN) 75 MG tablet Take 150 mg by mouth daily.   Yes [provider]  Cholecalciferol (VITAMIN D) 50 MCG (2000 UT) tablet Take 2,000 Units by mouth daily.   Yes [provider]   cyclobenzaprine (FLEXERIL) 5 MG tablet Take 5 mg by mouth daily. 12/10/18  Yes [provider]  diazepam (VALIUM) 5 MG tablet Take 5 mg by mouth every 8 (eight) hours as needed for anxiety.   Yes [provider]  fentaNYL (DURAGESIC) 50 MCG/HR Place 1 patch onto the skin as directed. Every 48 hours 12/07/18  Yes [provider]  HYDROcodone-acetaminophen (NORCO/VICODIN) 5-325 MG tablet Take 1 tablet by mouth every 4 (four) hours as needed for moderate pain.   Yes [provider]  insulin lispro (HUMALOG) 100 UNIT/ML injection Inject 2-10 Units into the skin See admin instructions. Sliding scale: 150-200=2u, 201-250=4u, 251-300=6u, 301-350=8u, 251-400=10u, >=400 call MD   Yes [provider]  INVEGA SUSTENNA 156 MG/ML SUSP injection Inject 1 mL (156 mg total) into the muscle every 30 (thirty) days. 09/05/14  Yes Dorie Rank, MD  lactulose [facility]) 10 GM/15ML solution Take 10 g by mouth daily.  08/12/16  Yes [provider]  letrozole (FEMARA) 2.5 MG tablet Take 2.5 mg by mouth daily.   Yes [provider]  linaclotide (LINZESS) 290 MCG CAPS capsule Take 1 capsule (290 mcg total) by mouth daily. 04/16/17 02/21/19 Yes Arrien, Jimmy Picket, MD  lisinopril (PRINIVIL,ZESTRIL) 10 MG tablet Take 10 mg by mouth daily.    Yes [provider]  lubiprostone (AMITIZA) 24 MCG capsule Take 24 mcg by mouth daily.   Yes [provider]  memantine (NAMENDA) 5 MG tablet Take 5 mg by mouth 2 (two) times daily.   Yes [provider]  metFORMIN (GLUCOPHAGE) 500 MG tablet Take 500 mg by mouth daily with breakfast.    Yes [provider]  Multiple Vitamin (MULTIVITAMIN WITH MINERALS) TABS tablet Take 1 tablet by mouth daily.   Yes [provider]  ondansetron (ZOFRAN) 4 MG tablet Take 4 mg by mouth every 8 (eight) hours as needed for nausea or vomiting.   Yes [provider]  QUEtiapine (SEROQUEL) 100 MG  tablet Take 100 mg by mouth every 12 (twelve) hours as needed (sleep/agitation).  12/27/18  Yes [provider]  risperiDONE (RISPERDAL) 0.25 MG tablet Take 0.25 mg by mouth 2 (two) times daily as needed (agitation).    Yes [provider]  senna (SENOKOT) 8.6 MG tablet Take 1 tablet by mouth 2 (two) times daily.   Yes [provider]  temazepam (RESTORIL) 15 MG capsule Take 15 mg by mouth at bedtime.   Yes [provider]  thiamine 100 MG tablet Take 1 tablet (100 mg total) by mouth daily. 09/24/16  Yes Rai, Ripudeep K, MD  zinc sulfate 220 (50 Zn) MG capsule Take 220 mg by mouth daily.   Yes [provider]    Allergies    Penicillins, 5-alpha reductase inhibitors, and  Percocet [oxycodone-acetaminophen]  Review of Systems   Review of Systems 10 Systems reviewed and are negative for acute change except as noted in the HPI. Physical Exam Updated Vital Signs BP 118/66   Pulse (!) 102   Temp 98.3 F (36.8 C) (Oral)   Resp 17   LMP  (LMP Unknown) Comment: years ago per Pt  SpO2 100%   Physical Exam Constitutional:      Comments: Patient is alert sitting in the stretcher.  She is awake and in no distress.  Heart rate on the monitor is sinus rhythm low 100s.  Blood pressure mid 90s over 60s.  Oxygen saturation 100%.  HENT:     Head: Normocephalic and atraumatic.     Mouth/Throat:     Mouth: Mucous membranes are moist.     Pharynx: Oropharynx is clear.  Eyes:     Extraocular Movements: Extraocular movements intact.  Cardiovascular:     Comments: Borderline tachycardia.  No gross rub murmur gallop. Pulmonary:     Comments: No respiratory distress at rest.  Lungs are grossly clear. Abdominal:     Comments: Abdomen is soft.  Patient mildly endorses right upper quadrant pain.  No guarding no rebound.  Musculoskeletal:     Comments: No peripheral edema.  Skin condition of lower extremities is very good.  Upper extremities are in good condition  without wounds or cellulitis.  Skin:    General: Skin is warm and dry.  Neurological:     Comments: Patient is alert.  She is interactive.  She does assist in following commands.  No focal motor deficits.  Psychiatric:        Mood and Affect: Mood normal.     ED Results / Procedures / Treatments   Labs (all labs ordered are listed, but only abnormal results are displayed) Labs Reviewed  CBC WITH DIFFERENTIAL/PLATELET - Abnormal; Notable for the following components:      Result Value   WBC 10.6 (*)    RDW 17.9 (*)    Neutro Abs 8.2 (*)    All other components within normal limits  COMPREHENSIVE METABOLIC PANEL - Abnormal; Notable for the following components:   Sodium 133 (*)    Potassium 3.0 (*)    CO2 15 (*)    Glucose, Bld 337 (*)    Total Protein 6.1 (*)    Albumin 2.8 (*)    AST 10 (*)    All other components within normal limits  D-DIMER, QUANTITATIVE (NOT AT [facility]) - Abnormal; Notable for the following components:   D-Dimer, Quant 1.60 (*)    All other components within normal limits  FIBRINOGEN - Abnormal; Notable for the following components:   Fibrinogen 602 (*)    All other components within normal limits  CULTURE, BLOOD (ROUTINE X 2)  CULTURE, BLOOD (ROUTINE X 2)  MRSA PCR SCREENING  LACTIC ACID, PLASMA  PROCALCITONIN  LACTATE DEHYDROGENASE  TRIGLYCERIDES  BRAIN NATRIURETIC PEPTIDE  LACTIC ACID, PLASMA  FERRITIN  C-REACTIVE PROTEIN  URINALYSIS, ROUTINE W REFLEX MICROSCOPIC  TROPONIN I (HIGH SENSITIVITY)  TROPONIN I (HIGH SENSITIVITY)    EKG EKG Interpretation  Date/Time:  Monday February 21 2019 12:56:05 EST Ventricular Rate:  109 PR Interval:    QRS Duration: 78 QT Interval:  326 QTC Calculation: 439 R Axis:   21 Text Interpretation: Sinus tachycardia Borderline low voltage, extremity leads Minimal ST elevation, inferior leads no sig change from previous Confirmed by Charlesetta Shanks [id number]) on 02/21/2019 3:20:11 PM  Radiology DG Chest Port 1  View  Result Date: 02/21/2019 CLINICAL DATA:  Tachycardia, tachypnea, febrile EXAM: PORTABLE CHEST 1 VIEW COMPARISON:  01/27/2019, 04/14/2017 FINDINGS: Single frontal view of the chest demonstrates a stable cardiac silhouette. Interval progression of upper lobe predominant interstitial prominence, with mild perihilar ground-glass airspace disease. No effusion or pneumothorax. No acute bony abnormalities. IMPRESSION: 1. Increasing upper lobe predominant interstitial and perihilar ground-glass opacities. Findings could reflect interstitial edema or atypical viral pneumonia. Electronically Signed   By: Randa Ngo M.D.   On: 02/21/2019 13:00    Procedures Procedures (including critical care time) CRITICAL CARE Performed by: Charlesetta Shanks   Total critical care time: 30 minutes  Critical care time was exclusive of separately billable procedures and treating other patients.  Critical care was necessary to treat or prevent imminent or life-threatening deterioration.  Critical care was time spent personally by me on the following activities: development of treatment plan with patient and/or surrogate as well as nursing, discussions with consultants, evaluation of patient's response to treatment, examination of patient, obtaining history from patient or surrogate, ordering and performing treatments and interventions, ordering and review of laboratory studies, ordering and review of radiographic studies, pulse oximetry and re-evaluation of patient's condition. Medications Ordered in ED Medications  sodium chloride 0.9 % bolus 500 mL (500 mLs Intravenous New Bag/Given 02/21/19 1502)    Followed by  0.9 %  sodium chloride infusion (1,000 mLs Intravenous New Bag/Given 02/21/19 1503)  ceFEPIme (MAXIPIME) 2 g in sodium chloride 0.9 % 100 mL IVPB (2 g Intravenous New Bag/Given 02/21/19 1506)  vancomycin (VANCOCIN) IVPB 1000 mg/200 mL premix (has no administration in time range)  vancomycin (VANCOREADY) IVPB  750 mg/150 mL (has no administration in time range)    ED Course  I have reviewed the triage vital signs and the nursing notes.  Pertinent labs & imaging results that were available during my care of the patient were reviewed by me and considered in my medical decision making (see chart for details).    MDM Rules/Calculators/A&P                     Consult: [facility] hospital[facility] Dr. Roosevelt Locks accepts for admission.  Patient recently was admitted with Covid.  Reportedly, she had a fever of 105.  She was given Tylenol prior to arrival we have not documented any fever in the emergency department.  Patient has been persistently low-grade tachycardic.  Blood pressures were soft on arrival.  They are improving with fluid hydration.  Clinically, patient is not showing any signs of respiratory distress.  Her mental status is alert and interactive.  She does not appear encephalopathic.  There is documented history of dementia and psychiatric illness which may contribute to issues with recall and communication of symptoms.  Chest x-ray shows some increasing upper lobe infiltrates.  With patient being managed for suspected sepsis will opt to treat for HCAP.  Urinalysis still pending.  plan for admission for SIRS/Possible sepsis with suspected source of secondary bacterial pneumonia.  Final Clinical Impression(s) / ED Diagnoses Final diagnoses:  HCAP (healthcare-associated pneumonia)  COVID-19  Severe comorbid illness    Rx / DC Orders ED Discharge Orders    None       Charlesetta Shanks, MD 02/21/19 1523

## 2019-02-21 NOTE — Progress Notes (Signed)
   02/21/19 2006  Clinical Encounter Type  Visited With Patient not available  Visit Type Initial  Referral From Chaplain  Consult/Referral To [place]  This chaplain unsuccessfully attempted by phoned to respond to Pt. spiritual care consult for prayer.  On the last attempt the chaplain delivered a pocket prayer square and understood the Pt. desired prayer by phone.  The Pt. did not answer the phone when the chaplain called.  This chaplain is available for F/U spiritual care as needed.

## 2019-02-21 NOTE — ED Notes (Addendum)
Family updated as to patient's status. Spoke with the daughter via Facetime from patient's room.

## 2019-02-21 NOTE — ED Notes (Signed)
Gave pt graham crackers, applesauce, and water.

## 2019-02-21 NOTE — ED Notes (Signed)
Dinner tray ordered 7:06 

## 2019-02-21 NOTE — ED Notes (Signed)
Pharmacy contacted multiple times in reference to verifying 1630 medications, medications are unverified/unavailable. Unable to be administered at this time.

## 2019-02-21 NOTE — ED Notes (Signed)
Pt refusing to wear oxygen.

## 2019-02-22 DIAGNOSIS — Z885 Allergy status to narcotic agent status: Secondary | ICD-10-CM | POA: Diagnosis not present

## 2019-02-22 DIAGNOSIS — I959 Hypotension, unspecified: Secondary | ICD-10-CM

## 2019-02-22 DIAGNOSIS — J690 Pneumonitis due to inhalation of food and vomit: Secondary | ICD-10-CM | POA: Diagnosis present

## 2019-02-22 DIAGNOSIS — R6520 Severe sepsis without septic shock: Secondary | ICD-10-CM | POA: Diagnosis present

## 2019-02-22 DIAGNOSIS — F0390 Unspecified dementia without behavioral disturbance: Secondary | ICD-10-CM | POA: Diagnosis present

## 2019-02-22 DIAGNOSIS — C7960 Secondary malignant neoplasm of unspecified ovary: Secondary | ICD-10-CM | POA: Diagnosis not present

## 2019-02-22 DIAGNOSIS — Z7189 Other specified counseling: Secondary | ICD-10-CM | POA: Diagnosis not present

## 2019-02-22 DIAGNOSIS — F419 Anxiety disorder, unspecified: Secondary | ICD-10-CM | POA: Diagnosis present

## 2019-02-22 DIAGNOSIS — Z8616 Personal history of COVID-19: Secondary | ICD-10-CM | POA: Diagnosis not present

## 2019-02-22 DIAGNOSIS — Z8701 Personal history of pneumonia (recurrent): Secondary | ICD-10-CM | POA: Diagnosis not present

## 2019-02-22 DIAGNOSIS — E8770 Fluid overload, unspecified: Secondary | ICD-10-CM | POA: Diagnosis present

## 2019-02-22 DIAGNOSIS — R69 Illness, unspecified: Secondary | ICD-10-CM | POA: Diagnosis not present

## 2019-02-22 DIAGNOSIS — A419 Sepsis, unspecified organism: Secondary | ICD-10-CM | POA: Diagnosis present

## 2019-02-22 DIAGNOSIS — I10 Essential (primary) hypertension: Secondary | ICD-10-CM | POA: Diagnosis present

## 2019-02-22 DIAGNOSIS — R0602 Shortness of breath: Secondary | ICD-10-CM | POA: Diagnosis not present

## 2019-02-22 DIAGNOSIS — F0391 Unspecified dementia with behavioral disturbance: Secondary | ICD-10-CM | POA: Diagnosis not present

## 2019-02-22 DIAGNOSIS — C562 Malignant neoplasm of left ovary: Secondary | ICD-10-CM | POA: Diagnosis present

## 2019-02-22 DIAGNOSIS — T380X5A Adverse effect of glucocorticoids and synthetic analogues, initial encounter: Secondary | ICD-10-CM | POA: Diagnosis present

## 2019-02-22 DIAGNOSIS — G893 Neoplasm related pain (acute) (chronic): Secondary | ICD-10-CM | POA: Diagnosis present

## 2019-02-22 DIAGNOSIS — K5909 Other constipation: Secondary | ICD-10-CM | POA: Diagnosis present

## 2019-02-22 DIAGNOSIS — E1165 Type 2 diabetes mellitus with hyperglycemia: Secondary | ICD-10-CM | POA: Diagnosis not present

## 2019-02-22 DIAGNOSIS — Z515 Encounter for palliative care: Secondary | ICD-10-CM | POA: Diagnosis present

## 2019-02-22 DIAGNOSIS — R000 Tachycardia, unspecified: Secondary | ICD-10-CM

## 2019-02-22 DIAGNOSIS — F17210 Nicotine dependence, cigarettes, uncomplicated: Secondary | ICD-10-CM | POA: Diagnosis present

## 2019-02-22 DIAGNOSIS — R509 Fever, unspecified: Secondary | ICD-10-CM | POA: Diagnosis present

## 2019-02-22 DIAGNOSIS — N390 Urinary tract infection, site not specified: Secondary | ICD-10-CM | POA: Diagnosis present

## 2019-02-22 DIAGNOSIS — Z880 Allergy status to penicillin: Secondary | ICD-10-CM | POA: Diagnosis not present

## 2019-02-22 DIAGNOSIS — Z66 Do not resuscitate: Secondary | ICD-10-CM | POA: Diagnosis present

## 2019-02-22 DIAGNOSIS — E872 Acidosis: Secondary | ICD-10-CM | POA: Diagnosis present

## 2019-02-22 DIAGNOSIS — F209 Schizophrenia, unspecified: Secondary | ICD-10-CM | POA: Diagnosis present

## 2019-02-22 DIAGNOSIS — E11649 Type 2 diabetes mellitus with hypoglycemia without coma: Secondary | ICD-10-CM | POA: Diagnosis not present

## 2019-02-22 DIAGNOSIS — J9601 Acute respiratory failure with hypoxia: Secondary | ICD-10-CM | POA: Diagnosis present

## 2019-02-22 LAB — LACTIC ACID, PLASMA
Lactic Acid, Venous: 1.7 mmol/L (ref 0.5–1.9)
Lactic Acid, Venous: 1.9 mmol/L (ref 0.5–1.9)
Lactic Acid, Venous: 2.7 mmol/L (ref 0.5–1.9)
Lactic Acid, Venous: 3.7 mmol/L (ref 0.5–1.9)

## 2019-02-22 LAB — BASIC METABOLIC PANEL
Anion gap: 14 (ref 5–15)
Anion gap: 11 (ref 5–15)
BUN: 11 mg/dL (ref 8–23)
BUN: 7 mg/dL — ABNORMAL LOW (ref 8–23)
CO2: 18 mmol/L — ABNORMAL LOW (ref 22–32)
CO2: 21 mmol/L — ABNORMAL LOW (ref 22–32)
Calcium: 8.8 mg/dL — ABNORMAL LOW (ref 8.9–10.3)
Calcium: 8.4 mg/dL — ABNORMAL LOW (ref 8.9–10.3)
Chloride: 106 mmol/L (ref 98–111)
Chloride: 102 mmol/L (ref 98–111)
Creatinine, Ser: 0.61 mg/dL (ref 0.44–1.00)
Creatinine, Ser: 0.64 mg/dL (ref 0.44–1.00)
GFR calc Af Amer: >60 mL/min (ref >60)
GFR calc Af Amer: >60 mL/min (ref >60)
GFR calc non Af Amer: >60 mL/min (ref >60)
GFR calc non Af Amer: >60 mL/min (ref >60)
Glucose, Bld: 247 mg/dL — ABNORMAL HIGH (ref 70–99)
Glucose, Bld: 189 mg/dL — ABNORMAL HIGH (ref 70–99)
Potassium: 3.8 mmol/L (ref 3.5–5.1)
Potassium: 3.3 mmol/L — ABNORMAL LOW (ref 3.5–5.1)
Sodium: 138 mmol/L (ref 135–145)
Sodium: 134 mmol/L — ABNORMAL LOW (ref 135–145)

## 2019-02-22 LAB — CBC
HCT: 39.7 % (ref 36.0–46.0)
Hemoglobin: 12.7 g/dL (ref 12.0–15.0)
MCH: 28 pg (ref 26.0–34.0)
MCHC: 32 g/dL (ref 30.0–36.0)
MCV: 87.4 fL (ref 80.0–100.0)
Platelets: 253 10*3/uL (ref 150–400)
RBC: 4.54 MIL/uL (ref 3.87–5.11)
RDW: 17.7 % — ABNORMAL HIGH (ref 11.5–15.5)
WBC: 10.3 10*3/uL (ref 4.0–10.5)
nRBC: 0 % (ref 0.0–0.2)

## 2019-02-22 LAB — GLUCOSE, CAPILLARY
Glucose-Capillary: 169 mg/dL — ABNORMAL HIGH (ref 70–99)
Glucose-Capillary: 191 mg/dL — ABNORMAL HIGH (ref 70–99)
Glucose-Capillary: 117 mg/dL — ABNORMAL HIGH (ref 70–99)
Glucose-Capillary: 231 mg/dL — ABNORMAL HIGH (ref 70–99)

## 2019-02-22 MED ORDER — LACTATED RINGERS IV BOLUS
500.0000 mL | Freq: Once | INTRAVENOUS | Status: AC
  Administered 2019-02-22: 01:00:00 500 mL via INTRAVENOUS

## 2019-02-22 MED ORDER — POTASSIUM CHLORIDE 10 MEQ/100ML IV SOLN
10.0000 meq | INTRAVENOUS | Status: AC
  Administered 2019-02-22 (×3): 10 meq via INTRAVENOUS
  Filled 2019-02-22 (×3): qty 100

## 2019-02-22 MED ORDER — SODIUM CHLORIDE 0.9 % IV BOLUS
500.0000 mL | Freq: Once | INTRAVENOUS | Status: AC
  Administered 2019-02-22: 500 mL via INTRAVENOUS

## 2019-02-22 MED ORDER — METOPROLOL TARTRATE 5 MG/5ML IV SOLN
2.5000 mg | INTRAVENOUS | Status: AC | PRN
  Administered 2019-02-22 (×2): 2.5 mg via INTRAVENOUS
  Filled 2019-02-22: qty 5

## 2019-02-22 MED ORDER — POTASSIUM CHLORIDE 10 MEQ/100ML IV SOLN
10.0000 meq | INTRAVENOUS | Status: AC
  Administered 2019-02-22 (×4): 10 meq via INTRAVENOUS
  Filled 2019-02-22 (×5): qty 100

## 2019-02-22 NOTE — Progress Notes (Addendum)
PROGRESS NOTE  Kristen Colon [id number] DOB: 1950-06-21 DOA: 02/21/2019 PCP: Everardo Beals, NP  Brief History   The patient is a 69 yr old woman who is a resident of a SNF who was sent to the ED for evaluation of fever and tachycardia. She had been diagnosed with COVID-19 on 01/27/2019 and discharged back to the [facility] on 02/08/2019. She carries a past medical history significant for hypertension, DM II, schizophrenia, left epithelial ovarian cancer, dementia, and anxiety.   In the ED the patient was found to be septic with HR in the 100's, Hypotension with systolic blood pressure in the 90's, She was requiring 2L O2 by nasal cannula. She was afebrile, although per the SNF, she had been febrile there. CXR demonstrated increasing upper lobe interstitial and perihilar ground glass opacities. Procalcitonin was negative. UA was positive for UTI. She has been started on Rocephin. Blood cultures x 2 and Urine cultures have been ordered.  The patient was initially admitted to 5W on telemetry. However, as she is now outside the 21 day window for COVID 19, she will be transferred to a telemetry bed. Today she remains tachycardic with HR 120-140. EKG is pending. Her Oxygen requirements have increased to 3L. She has been confused and agitated while on 5W.  Consultants  . None  Procedures  . None  Antibiotics   Anti-infectives (From admission, onward)   Start     Dose/Rate Route Frequency Ordered Stop   02/22/19 1500  vancomycin (VANCOREADY) IVPB 750 mg/150 mL  Status:  Discontinued     750 mg 150 mL/hr over 60 Minutes Intravenous Every 24 hours 02/21/19 1452 02/21/19 1957   02/22/19 1500  cefTRIAXone (ROCEPHIN) 1 g in sodium chloride 0.9 % 100 mL IVPB     1 g 200 mL/hr over 30 Minutes Intravenous Every 24 hours 02/21/19 1957     02/21/19 1445  aztreonam (AZACTAM) 2 g in sodium chloride 0.9 % 100 mL IVPB  Status:  Discontinued     2 g 200 mL/hr over 30 Minutes Intravenous  Once 02/21/19 1432  02/21/19 1439   02/21/19 1445  ceFEPIme (MAXIPIME) 2 g in sodium chloride 0.9 % 100 mL IVPB  Status:  Discontinued     2 g 200 mL/hr over 30 Minutes Intravenous Every 8 hours 02/21/19 1444 02/21/19 1957   02/21/19 1445  vancomycin (VANCOCIN) IVPB 1000 mg/200 mL premix     1,000 mg 200 mL/hr over 60 Minutes Intravenous  Once 02/21/19 1444 02/21/19 1710    .  Subjective  The patient is resting quietly. No new complaints.   Objective   Vitals:  Vitals:   02/22/19 1245 02/22/19 1453  BP: 105/61   Pulse: (!) 110 (!) 120  Resp: (!) 24 (!) 24  Temp:    SpO2: 95% 96%   Exam:  Constitutional:  . The patient is awake, alert, and oriented x 3. No acute distress. Eyes:  . pupils and irises appear normal . Normal lids and conjunctivae ENMT:  . grossly normal hearing  . Lips appear normal . external ears, nose appear normal . Oropharynx: mucosa, tongue,posterior pharynx appear normal Neck:  . neck appears normal, no masses, normal ROM, supple . no thyromegaly Respiratory:  . No increased work of breathing. . No wheezes, rales, or rhonchi . No tactile fremitus Cardiovascular:  . Regular rate and rhythm . No murmurs, ectopy, or gallups. . No lateral PMI. No thrills. Abdomen:  . Abdomen is soft, non-tender, non-distended . No hernias,  masses, or organomegaly . Normoactive bowel sounds.  Musculoskeletal:  . No cyanosis, clubbing, or edema Skin:  . No rashes, lesions, ulcers . palpation of skin: no induration or nodules Neurologic:  . CN 2-12 intact . Sensation all 4 extremities intact Psychiatric:  . Mental status o Mood, affect appropriate o Orientation to person, place, time  . judgment and insight appear intact   I have personally reviewed the following:   Today's Data  . Vitals, CBC, BMP, procalcitonin  Micro Data  . BC x 2 drawn . Urine culture ordered.  Imaging  . CXR  Cardiology Data  . EKG - sinus tachycardia  Scheduled Meds: . buPROPion  150 mg  Oral Daily  . cholecalciferol  2,000 Units Oral Daily  . cyclobenzaprine  5 mg Oral Daily  . enoxaparin (LOVENOX) injection  40 mg Subcutaneous Q24H  . fentaNYL  1 patch Transdermal Q48H  . insulin aspart  0-15 Units Subcutaneous TID WC  . insulin aspart  0-5 Units Subcutaneous QHS  . lactulose  10 g Oral Daily  . letrozole  2.5 mg Oral Daily  . linaclotide  290 mcg Oral QAC breakfast  . lisinopril  10 mg Oral Daily  . lubiprostone  24 mcg Oral Q breakfast  . memantine  5 mg Oral BID  . multivitamin with minerals  1 tablet Oral Daily  . senna  1 tablet Oral BID  . temazepam  15 mg Oral QHS  . thiamine  100 mg Oral Daily   Continuous Infusions: . sodium chloride Stopped (02/21/19 1710)  . sodium chloride 100 mL/hr at 02/21/19 2354  . cefTRIAXone (ROCEPHIN)  IV Stopped (02/22/19 1414)    Active Problems:   Fever   Sepsis ([place])   LOS: 0 days   A & P  Sepsis: Tachycardia, hypotension, fever, lactic acidosis, hypoxia. Likely due to UTI, although BC x 2 is pending, and CXR demonstrated opacities consistent with either pulmonary edema or infiltrated. procalcitonin is negative. Pt is receiving empiric Rocephin pending cultures. She is now outside the 21 day window for COVID-19. She will be transferred off of the floow.  Tachycardia: Continued fast HR today. EKG demonstrates sinus tachycardia with hypotension. Will give the patient a bolus of NS. Continue to monitor on telemetry.  DM II: Elevated glucoses due to steroids which are now completed. FSBS for the last 24 hours have run between 169 - 289. She is receiving correction insulin only. I will add lantus 12 units daily. Continue to monitor. Hold metformin while inpatient.   Hypotension: Likely due to sepsis. Will give a NS bolus. Monitor volume status.  Chronic constipation: Miralax bid. Monitor.  Dementia: Pt has been quite agitated and confused. Continue seroquel and risperdal as at home.   I have seen and examined this  patient myself. I have spent 38 minutes in her evaluation and care.  Amylynn Fano, DO [facility] Direct contact: see [email or web address]  7PM-7AM contact night coverage as above 02/22/2019, 3:02 PM  LOS: 0 days

## 2019-02-22 NOTE — Progress Notes (Signed)
Notified MD of patients heart rate sustaining >120, no new orders. Will continue to monitor.

## 2019-02-22 NOTE — Plan of Care (Signed)

## 2019-02-22 NOTE — Evaluation (Signed)
Physical Therapy Evaluation Patient Details Name: Kristen Colon MRN: [id number] DOB: October 08, 1950 Today's Date: 02/22/2019   History of Present Illness  69 y/o female pt w/ hx of schizophrenia, HTN, headache, dyspnea, dementia, anxiety, recently dx with COVID at ALF had c/o dyspnea. Pt admitted for acute respiratory failure with hypoxia and covid-19 who was d/c'd to [facility] 02/08/19 with ability to ambulate 125 feet with Rollator. Sent from [facility] 02/21/19 with fever and tachycardia.   Clinical Impression  PTA pt was at [facility] SNF undergoing rehab from prior hospitalization due to [place]. Per last PT note at [facility] pt was able to ambulate with Rollator, however requires assist for ADLs. Pt currently disoriented, only answering yes/no questions. Pt supervision for bed mobility, min A for transfers and min A for ambulation with RW around foot of bed. Pt with incontinence half way through ambulation, pt able to continue to recliner where she was cleaned up before sitting. PT recommending return to SNF level rehab at discharge. Per notes daughter is not happy with [facility]. PT will continue to follow acutely.     Follow Up Recommendations SNF;Supervision/Assistance - 24 hour    Equipment Recommendations  Rolling walker with 5" wheels    Recommendations for Other Services   OT Services    Precautions / Restrictions Precautions Precautions: Fall Restrictions Weight Bearing Restrictions: No      Mobility  Bed Mobility Overal bed mobility: Needs Assistance Bed Mobility: Supine to Sit     Supine to sit: Supervision     General bed mobility comments: supervision for safety  Transfers Overall transfer level: Needs assistance Equipment used: Rolling walker (2 wheeled)   Sit to Stand: Min guard         General transfer comment: min guard for safety, vc for hand placement for power up, ultimately reaches both hands up to RW and pulls herself up    Ambulation/Gait Ambulation/Gait assistance: Min guard;Min assist Gait Distance (Feet): 12 Feet   Gait Pattern/deviations: Step-through pattern;Shuffle Gait velocity: slowed Gait velocity interpretation: <1.31 ft/sec, indicative of household ambulator General Gait Details: min guard for shuffling gait with decreased foot clearance, after about 5 feet of ambulation states"I have to pee" and proceeded to urinate, had pt walk remainder of distance to recliner where wet socks could be removed safely        Balance Overall balance assessment: Needs assistance Sitting-balance support: Feet supported;No upper extremity supported Sitting balance-Leahy Scale: Good     Standing balance support: Bilateral upper extremity supported Standing balance-Leahy Scale: Poor                               Pertinent Vitals/Pain Pain Assessment: No/denies pain    Home Living Family/patient expects to be discharged to:: Skilled nursing facility Living Arrangements: Children                    Prior Function Level of Independence: Needs assistance   Gait / Transfers Assistance Needed: pt using RW for ambulation at SNF  ADL's / Homemaking Assistance Needed: assist with ADLs           Extremity/Trunk Assessment   Upper Extremity Assessment Upper Extremity Assessment: Generalized weakness    Lower Extremity Assessment Lower Extremity Assessment: Generalized weakness       Communication   Communication: No difficulties  Cognition Arousal/Alertness: Awake/alert Behavior During Therapy: Flat affect Overall Cognitive Status: History of  cognitive impairments - at baseline                   Orientation Level: Disoriented to;Place;Time;Situation;Person(states why do people keep asking my name never says ) Current Attention Level: Alternating Memory: Decreased short-term memory   Safety/Judgement: Decreased awareness of safety Awareness: Emergent Problem  Solving: Slow processing General Comments: dementia at baseline      General Comments General comments (skin integrity, edema, etc.): Pt on 2L O2 via [place] on entry with SaO2 96%O2, ambulated around bed on RA with SaO2 drop to 86%O2, supplemental O2 of 2L via [place] returned and SaO2 quickly rebounded to 93%O2        Assessment/Plan    PT Assessment Patient needs continued PT services  PT Problem List Decreased strength;Decreased activity tolerance;Decreased balance;Decreased mobility;Decreased coordination;Decreased cognition;Decreased knowledge of use of DME;Decreased safety awareness       PT Treatment Interventions DME instruction;Gait training;Functional mobility training;Therapeutic activities;Therapeutic exercise;Balance training;Cognitive remediation;Patient/family education    PT Goals (Current goals can be found in the Care Plan section)  Acute Rehab PT Goals Patient Stated Goal: none stated PT Goal Formulation: Patient unable to participate in goal setting Time For Goal Achievement: 03/08/19 Potential to Achieve Goals: Fair    Frequency Min 2X/week    AM-PAC PT "6 Clicks" Mobility  Outcome Measure Help needed turning from your back to your side while in a flat bed without using bedrails?: None Help needed moving from lying on your back to sitting on the side of a flat bed without using bedrails?: None Help needed moving to and from a bed to a chair (including a wheelchair)?: None Help needed standing up from a chair using your arms (e.g., wheelchair or bedside chair)?: None Help needed to walk in hospital room?: None Help needed climbing 3-5 steps with a railing? : A Little 6 Click Score: 23    End of Session Equipment Utilized During Treatment: Oxygen;Gait belt Activity Tolerance: Patient tolerated treatment well Patient left: in chair;with call bell/phone within reach;with chair alarm set;with nursing/sitter in room Nurse Communication: Mobility status PT Visit  Diagnosis: Unsteadiness on feet (R26.81);Muscle weakness (generalized) (M62.81)    Time: [id number] PT Time Calculation (min) (ACUTE ONLY): 22 min   Charges:   PT Evaluation $PT Eval Moderate Complexity: 1 Mod          Itzael Liptak B. Migdalia Dk PT, DPT Acute Rehabilitation Services Pager [phone] Office [phone]   [place] 02/22/2019, 10:12 AM

## 2019-02-22 NOTE — Progress Notes (Signed)
Patient HR maintained 160s on 3L O2. [facility] on call notified. Orders were to give Lopressor 2.5 mg Q50min PRN.  See MAR record given twice. VS taken and charted. Charge nurse notified. willl cont to monitor

## 2019-02-22 NOTE — TOC Initial Note (Signed)
Transition of Care [facility]) - Initial/Assessment Note    Patient Details  Name: Kristen Colon MRN: [id number] Date of Birth: [birth year]  [facility] CM/SW Contact:    Amador Cunas, [place] Phone Number: 02/22/2019, 12:49 PM  Clinical Narrative:   Pt admitted from [facility]. Dtr refusing pt return to [facility] or any other SNF. Dtr agreeable to [facility] or hospice services at home pending pt's prognosis and needs. Will follow and intervene as indicated.   Wandra Feinstein, MSW, LCSW [phone] (Coverage)                  Expected Discharge Plan: [place] Barriers to Discharge: Continued Medical Work up   Patient Goals and CMS Choice        Expected Discharge Plan and Services Expected Discharge Plan: [place]       Living arrangements for the past 2 months: [place], [place]                                      Prior Living Arrangements/Services Living arrangements for the past 2 months: [place], [place] Lives with:: Facility Resident, Adult Children Patient language and need for interpreter reviewed:: No Do you feel safe going back to the place where you live?: No   Dtr refuses for pt tp return to SNF due to inadequate care at facility. Plans for mom to return home with her and Maceo  Need for Family Participation in Patient Care: Yes (Comment) Care giver support system in place?: Yes (comment)      Activities of Daily Living Home Assistive Devices/Equipment: Eyeglasses, Dentures (specify type), Walker (specify type) ADL Screening (condition at time of admission) Patient's cognitive ability adequate to safely complete daily activities?: No Is the patient deaf or have difficulty hearing?: No Does the patient have difficulty seeing, even when wearing glasses/contacts?: No Does the patient have difficulty concentrating, remembering, or making decisions?:  Yes Patient able to express need for assistance with ADLs?: Yes Does the patient have difficulty dressing or bathing?: No Independently performs ADLs?: No Communication: Independent Dressing (OT): Needs assistance Is this a change from baseline?: Pre-admission baseline Grooming: Needs assistance Is this a change from baseline?: Pre-admission baseline Feeding: Independent Bathing: Needs assistance Is this a change from baseline?: Pre-admission baseline Toileting: Needs assistance Is this a change from baseline?: Pre-admission baseline In/Out Bed: Independent Walks in Home: Independent with device (comment)(walker) Does the patient have difficulty walking or climbing stairs?: Yes Weakness of Legs: None Weakness of Arms/Hands: None  Permission Sought/Granted                  Emotional Assessment       Orientation: : Oriented to Self, Oriented to Place, Oriented to  Time, Oriented to Situation      Admission diagnosis:  Fever [R50.9] HCAP (healthcare-associated pneumonia) [J18.9] Severe comorbid illness [R69] COVID-19 [U07.1] Sepsis ([place]) [A41.9] Patient Active Problem List   Diagnosis Date Noted  . Sepsis ([place]) 02/22/2019  . Fever 02/21/2019  . Pneumonia due to COVID-19 virus 01/29/2019  . Acute respiratory failure with hypoxia ([place]) 01/27/2019  . COVID-19 virus infection 01/27/2019  . Pelvic mass in female 06/30/2017  . 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 Elevated CA-125 05/12/2017  . Peritoneal carcinomatosis ([place]) 05/12/2017  . 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  Ovarian mass 09/20/2016  . Non-intractable vomiting with nausea 09/20/2016  . Hypertension, uncontrolled 09/20/2016  . Diabetes mellitus type 2 in nonobese ([place]) 09/20/2016  . Acute encephalopathy 09/19/2016  . Schizoaffective disorder, chronic condition with acute exacerbation ([place])   . Schizophrenia, undifferentiated ([place]) 06/21/2014  . Delusional disorder ([place])   . Psychoses ([place])    . Gallbladder polyp 07/21/2011  . Liver mass 07/21/2011   PCP:  Everardo Beals, NP Pharmacy:   [place], [place] [address] Phone: [phone] Fax: [phone]     Social Determinants of Health (SDOH) Interventions    Readmission Risk Interventions No flowsheet data found.

## 2019-02-22 NOTE — Progress Notes (Signed)
   MD notified. Orders Given. Patient resting in bed

## 2019-02-22 NOTE — Progress Notes (Signed)
Critical Lab: Lactic Acid 3.7. MD notifed

## 2019-02-23 DIAGNOSIS — A419 Sepsis, unspecified organism: Secondary | ICD-10-CM | POA: Diagnosis not present

## 2019-02-23 DIAGNOSIS — I959 Hypotension, unspecified: Secondary | ICD-10-CM | POA: Diagnosis not present

## 2019-02-23 DIAGNOSIS — N390 Urinary tract infection, site not specified: Secondary | ICD-10-CM | POA: Diagnosis not present

## 2019-02-23 DIAGNOSIS — R509 Fever, unspecified: Secondary | ICD-10-CM | POA: Diagnosis not present

## 2019-02-23 DIAGNOSIS — F0391 Unspecified dementia with behavioral disturbance: Secondary | ICD-10-CM | POA: Diagnosis not present

## 2019-02-23 LAB — CBC WITH DIFFERENTIAL/PLATELET
Abs Immature Granulocytes: 0.02 10*3/uL (ref 0.00–0.07)
Basophils Absolute: 0 10*3/uL (ref 0.0–0.1)
Basophils Relative: 0 %
Eosinophils Absolute: 0.3 10*3/uL (ref 0.0–0.5)
Eosinophils Relative: 4 %
HCT: 36.2 % (ref 36.0–46.0)
Hemoglobin: 11.7 g/dL — ABNORMAL LOW (ref 12.0–15.0)
Immature Granulocytes: 0 %
Lymphocytes Relative: 20 %
Lymphs Abs: 1.7 10*3/uL (ref 0.7–4.0)
MCH: 27.8 pg (ref 26.0–34.0)
MCHC: 32.3 g/dL (ref 30.0–36.0)
MCV: 86 fL (ref 80.0–100.0)
Monocytes Absolute: 1.2 10*3/uL — ABNORMAL HIGH (ref 0.1–1.0)
Monocytes Relative: 13 %
Neutro Abs: 5.5 10*3/uL (ref 1.7–7.7)
Neutrophils Relative %: 63 %
Platelets: 222 10*3/uL (ref 150–400)
RBC: 4.21 MIL/uL (ref 3.87–5.11)
RDW: 17.7 % — ABNORMAL HIGH (ref 11.5–15.5)
WBC: 8.8 10*3/uL (ref 4.0–10.5)
nRBC: 0 % (ref 0.0–0.2)

## 2019-02-23 LAB — BASIC METABOLIC PANEL
Anion gap: 11 (ref 5–15)
BUN: 6 mg/dL — ABNORMAL LOW (ref 8–23)
CO2: 22 mmol/L (ref 22–32)
Calcium: 8.6 mg/dL — ABNORMAL LOW (ref 8.9–10.3)
Chloride: 102 mmol/L (ref 98–111)
Creatinine, Ser: 0.49 mg/dL (ref 0.44–1.00)
GFR calc Af Amer: >60 mL/min (ref >60)
GFR calc non Af Amer: >60 mL/min (ref >60)
Glucose, Bld: 206 mg/dL — ABNORMAL HIGH (ref 70–99)
Potassium: 3.5 mmol/L (ref 3.5–5.1)
Sodium: 135 mmol/L (ref 135–145)

## 2019-02-23 LAB — GLUCOSE, CAPILLARY
Glucose-Capillary: 203 mg/dL — ABNORMAL HIGH (ref 70–99)
Glucose-Capillary: 93 mg/dL (ref 70–99)
Glucose-Capillary: 134 mg/dL — ABNORMAL HIGH (ref 70–99)
Glucose-Capillary: 184 mg/dL — ABNORMAL HIGH (ref 70–99)

## 2019-02-23 LAB — URINE CULTURE: Culture: <10000 — AB

## 2019-02-23 MED ORDER — METOPROLOL TARTRATE 12.5 MG HALF TABLET
12.5000 mg | ORAL_TABLET | Freq: Two times a day (BID) | ORAL | Status: DC
  Administered 2019-02-23 – 2019-03-03 (×16): 12.5 mg via ORAL
  Filled 2019-02-23 (×16): qty 1

## 2019-02-23 NOTE — Progress Notes (Addendum)
PROGRESS NOTE  Kristen Colon [id number] DOB: 1950-06-26 DOA: 02/21/2019 PCP: Everardo Beals, NP  Brief History   The patient is a 69 yr old woman who is a resident of a SNF who was sent to the ED for evaluation of fever and tachycardia. She had been diagnosed with COVID-19 on 01/27/2019 and discharged back to the [facility] on 02/08/2019. She carries a past medical history significant for hypertension, DM II, schizophrenia, left epithelial ovarian cancer, dementia, and anxiety.   In the ED the patient was found to be septic with HR in the 100's, Hypotension with systolic blood pressure in the 90's, She was requiring 2L O2 by nasal cannula. She was afebrile, although per the SNF, she had been febrile there. CXR demonstrated increasing upper lobe interstitial and perihilar ground glass opacities. Procalcitonin was negative. UA was positive for UTI. She has been started on Rocephin. Blood cultures x 2 and Urine cultures have been ordered.  The patient was initially admitted to 5W on telemetry. However, as she is now outside the 21 day window for COVID 19, she will be transferred to a telemetry bed. Today she remains tachycardic with HR 120-140. EKG is pending. Her Oxygen requirements have increased to 3L. She has been confused and agitated while on 5W.  Consultants  . None  Procedures  . None  Antibiotics   Anti-infectives (From admission, onward)   Start     Dose/Rate Route Frequency Ordered Stop   02/22/19 1500  vancomycin (VANCOREADY) IVPB 750 mg/150 mL  Status:  Discontinued     750 mg 150 mL/hr over 60 Minutes Intravenous Every 24 hours 02/21/19 1452 02/21/19 1957   02/22/19 1500  cefTRIAXone (ROCEPHIN) 1 g in sodium chloride 0.9 % 100 mL IVPB     1 g 200 mL/hr over 30 Minutes Intravenous Every 24 hours 02/21/19 1957     02/21/19 1445  aztreonam (AZACTAM) 2 g in sodium chloride 0.9 % 100 mL IVPB  Status:  Discontinued     2 g 200 mL/hr over 30 Minutes Intravenous  Once 02/21/19 1432  02/21/19 1439   02/21/19 1445  ceFEPIme (MAXIPIME) 2 g in sodium chloride 0.9 % 100 mL IVPB  Status:  Discontinued     2 g 200 mL/hr over 30 Minutes Intravenous Every 8 hours 02/21/19 1444 02/21/19 1957   02/21/19 1445  vancomycin (VANCOCIN) IVPB 1000 mg/200 mL premix     1,000 mg 200 mL/hr over 60 Minutes Intravenous  Once 02/21/19 1444 02/21/19 1710     Subjective  The patient is awake, interactive and calm today. No new complaints.  Objective   Vitals:  Vitals:   02/23/19 0754 02/23/19 0819  BP:  104/65  Pulse:  (!) 109  Resp: (!) 21 16  Temp:  98.8 F (37.1 C)  SpO2:  91%   Exam:  Constitutional:  . The patient is awake, alert, and oriented x 3. No acute distress. Eyes:  . pupils and irises appear normal . Normal lids and conjunctivae ENMT:  . grossly normal hearing  . Lips appear normal . external ears, nose appear normal . Oropharynx: mucosa, tongue,posterior pharynx appear normal Neck:  . neck appears normal, no masses, normal ROM, supple . no thyromegaly Respiratory:  . No increased work of breathing. . No wheezes, rales, or rhonchi . No tactile fremitus Cardiovascular:  . Regular rate and rhythm . No murmurs, ectopy, or gallups. . No lateral PMI. No thrills. Abdomen:  . Abdomen is soft, non-tender, non-distended .  No hernias, masses, or organomegaly . Normoactive bowel sounds.  Musculoskeletal:  . No cyanosis, clubbing, or edema Skin:  . No rashes, lesions, ulcers . palpation of skin: no induration or nodules Neurologic:  . CN 2-12 intact . Sensation all 4 extremities intact Psychiatric:  . Mental status o Mood, affect appropriate o Orientation to person, place, time  . judgment and insight appear intact   I have personally reviewed the following:   Today's Data  . Vitals, CBC, BMP  Micro Data  . BC x 2 drawn . Urine culture ordered.  Imaging  . CXR  Cardiology Data  . EKG - sinus tachycardia  Scheduled Meds: . buPROPion  150  mg Oral Daily  . cholecalciferol  2,000 Units Oral Daily  . cyclobenzaprine  5 mg Oral Daily  . enoxaparin (LOVENOX) injection  40 mg Subcutaneous Q24H  . fentaNYL  1 patch Transdermal Q48H  . insulin aspart  0-15 Units Subcutaneous TID WC  . insulin aspart  0-5 Units Subcutaneous QHS  . lactulose  10 g Oral Daily  . letrozole  2.5 mg Oral Daily  . linaclotide  290 mcg Oral QAC breakfast  . lubiprostone  24 mcg Oral Q breakfast  . memantine  5 mg Oral BID  . metoprolol tartrate  12.5 mg Oral BID  . multivitamin with minerals  1 tablet Oral Daily  . senna  1 tablet Oral BID  . temazepam  15 mg Oral QHS  . thiamine  100 mg Oral Daily   Continuous Infusions: . sodium chloride Stopped (02/21/19 1710)  . cefTRIAXone (ROCEPHIN)  IV Stopped (02/22/19 1414)    Active Problems:   Fever   Sepsis ([place])   LOS: 1 day   A & P  Sepsis: Tachycardia, hypotension, fever, lactic acidosis, hypoxia. Likely due to UTI, although BC x 2 is pending, and CXR demonstrated opacities consistent with either pulmonary edema or infiltrated. procalcitonin is negative. Pt is receiving empiric Rocephin pending cultures. She is now outside the 21 day window for COVID-19. She will be transferred off of the floor.  Tachycardia: Continued fast HR today. EKG demonstrates sinus tachycardia with hypotension. Low blood pressure limits attempts to lower heart rate with beta blockade. Will discontinue lisinopril and start metoprolol.   DM II: Elevated glucoses due to steroids which are now completed. FSBS for the last 24 hours have run between 117 - 231. She is receiving correction insulin only. I will add lantus 12 units daily. Continue to monitor. Hold metformin while inpatient.   Hypotension: Limits attempts to control HR through beta blockers.   Chronic constipation: Miralax bid. Monitor.  Dementia: Pt has been quite agitated and confused, but looks much better today. Continue seroquel and risperdal as at home.   I  have seen and examined this patient myself. I have spent 35 minutes in her evaluation and care.  Khaliah Barnick, DO [facility] Direct contact: see [email or web address]  7PM-7AM contact night coverage as above 02/23/2019, 11:01 PM  LOS: 0 days

## 2019-02-23 NOTE — Evaluation (Signed)
Occupational Therapy Evaluation Patient Details Name: Kristen Colon MRN: [id number] DOB: December 04, 1950 Today's Date: 02/23/2019    History of Present Illness 69 y/o female pt w/ hx of schizophrenia, HTN, headache, dyspnea, dementia, anxiety, recently dx with COVID at ALF had c/o dyspnea. Pt admitted for acute respiratory failure with hypoxia and covid-19 who was d/c'd to [facility] 02/08/19 with ability to ambulate 125 feet with Rollator. Sent from [facility] 02/21/19 with fever and tachycardia.    Clinical Impression   Pt was staying at short term SNF to rehab after [place] hospitalization - but prior to that was from home with daughter. Today she was alseep, awoke easily. She was able to SPT for transfer to the [facility] with min guard assist, able to perform peri care in min guard in standing, she required min A for assist with cutting up food and opening some containers. With transfer Pt's HR jumped up to 150, Pt is Asymptomatic and does not feel her heart racing, no SOB or other signs. OT will follow acutely and at this time recommend return to SNF for continued work to maximize safety and independence in ADL and functional transfers.     Follow Up Recommendations  Supervision/Assistance - 24 hour;SNF    Equipment Recommendations  None recommended by OT    Recommendations for Other Services       Precautions / Restrictions Precautions Precautions: Fall Restrictions Weight Bearing Restrictions: No      Mobility Bed Mobility Overal bed mobility: Needs Assistance Bed Mobility: Supine to Sit     Supine to sit: Supervision     General bed mobility comments: supervision for safety  Transfers Overall transfer level: Needs assistance Equipment used: 1 person hand held assist Transfers: Sit to/from Stand;Stand Pivot Transfers Sit to Stand: Min assist Stand pivot transfers: Min assist       General transfer comment: min A for boost and balance    Balance Overall balance assessment: Needs  assistance Sitting-balance support: Feet supported;No upper extremity supported Sitting balance-Leahy Scale: Good     Standing balance support: Single extremity supported Standing balance-Leahy Scale: Poor Standing balance comment: close supervision in static standing, dynamic activities require min guard assist.                            ADL either performed or assessed with clinical judgement   ADL Overall ADL's : Needs assistance/impaired Eating/Feeding: Minimal assistance;Sitting Eating/Feeding Details (indicate cue type and reason): assist to cut up food, and open some containers Grooming: Set up;Wash/dry hands;Wash/dry face;Sitting Grooming Details (indicate cue type and reason): EOB Upper Body Bathing: Set up;Sitting   Lower Body Bathing: Minimal assistance;Sit to/from stand   Upper Body Dressing : Set up;Supervision/safety;Sitting   Lower Body Dressing: Minimal assistance;Sit to/from stand   Toilet Transfer: Min Statistician Details (indicate cue type and reason): due to urgency Toileting- Clothing Manipulation and Hygiene: Min guard;Sitting/lateral lean       Functional mobility during ADLs: Minimal assistance General ADL Comments: Pt with HR up to 150 with transfer to [facility]. Pt Asymptomatic.     Vision         Perception     Praxis      Pertinent Vitals/Pain Pain Assessment: No/denies pain Pain Intervention(s): Monitored during session     Hand Dominance Right   Extremity/Trunk Assessment Upper Extremity Assessment Upper Extremity Assessment: Generalized weakness   Lower Extremity Assessment Lower Extremity Assessment: Generalized weakness  Cervical / Trunk Assessment Cervical / Trunk Assessment: Normal   Communication Communication Communication: No difficulties   Cognition Arousal/Alertness: Awake/alert Behavior During Therapy: Flat affect Overall Cognitive Status: History of cognitive impairments - at  baseline                                 General Comments: dementia at baseline, cooperative and follows simple one step directions   General Comments  on RA throughout session     Exercises     Shoulder Instructions      Home Living Family/patient expects to be discharged to:: Skilled nursing facility Living Arrangements: Children                               Additional Comments: gleaned from chart review      Prior Functioning/Environment Level of Independence: Needs assistance        Comments: Pt poor historian        OT Problem List: Decreased strength;Decreased activity tolerance;Impaired balance (sitting and/or standing);Decreased cognition;Decreased safety awareness;Cardiopulmonary status limiting activity;Pain      OT Treatment/Interventions: Self-care/ADL training;Therapeutic exercise;Energy conservation;Neuromuscular education;DME and/or AE instruction;Therapeutic activities;Cognitive remediation/compensation;Patient/family education    OT Goals(Current goals can be found in the care plan section) Acute Rehab OT Goals Patient Stated Goal: none stated OT Goal Formulation: With patient/family Time For Goal Achievement: 03/09/19 Potential to Achieve Goals: Good ADL Goals Pt Will Perform Grooming: with supervision;standing Pt Will Perform Upper Body Dressing: with modified independence;sitting Pt Will Perform Lower Body Dressing: with supervision;sit to/from stand Pt Will Transfer to Toilet: with supervision;ambulating Pt Will Perform Toileting - Clothing Manipulation and hygiene: with supervision;sit to/from stand  OT Frequency: Min 2X/week   Barriers to D/C:            Co-evaluation              AM-PAC OT "6 Clicks" Daily Activity     Outcome Measure Help from another person eating meals?: A Little Help from another person taking care of personal grooming?: A Little Help from another person toileting, which includes  using toliet, bedpan, or urinal?: A Little Help from another person bathing (including washing, rinsing, drying)?: A Little Help from another person to put on and taking off regular upper body clothing?: A Little Help from another person to put on and taking off regular lower body clothing?: A Little 6 Click Score: 18   End of Session    Activity Tolerance: Patient tolerated treatment well Patient left: in bed;with call bell/phone within reach;with bed alarm set;with nursing/sitter in room  OT Visit Diagnosis: Unsteadiness on feet (R26.81);Muscle weakness (generalized) (M62.81);Other symptoms and signs involving cognitive function                Time: 1045-1109 OT Time Calculation (min): 24 min Charges:  OT General Charges $OT Visit: 1 Visit OT Evaluation $OT Eval Moderate Complexity: 1 Mod OT Treatments $Self Care/Home Management : 8-22 mins  Jesse Sans OTR/L Acute Rehabilitation Services Pager: [phone] Office: [place] 02/23/2019, 12:19 PM

## 2019-02-24 ENCOUNTER — Inpatient Hospital Stay (HOSPITAL_COMMUNITY): Payer: Medicare Other

## 2019-02-24 DIAGNOSIS — J690 Pneumonitis due to inhalation of food and vomit: Secondary | ICD-10-CM | POA: Diagnosis not present

## 2019-02-24 DIAGNOSIS — A419 Sepsis, unspecified organism: Principal | ICD-10-CM

## 2019-02-24 DIAGNOSIS — I959 Hypotension, unspecified: Secondary | ICD-10-CM | POA: Diagnosis not present

## 2019-02-24 DIAGNOSIS — R6520 Severe sepsis without septic shock: Secondary | ICD-10-CM | POA: Diagnosis not present

## 2019-02-24 DIAGNOSIS — N390 Urinary tract infection, site not specified: Secondary | ICD-10-CM | POA: Diagnosis not present

## 2019-02-24 DIAGNOSIS — C7960 Secondary malignant neoplasm of unspecified ovary: Secondary | ICD-10-CM | POA: Diagnosis not present

## 2019-02-24 DIAGNOSIS — F0391 Unspecified dementia with behavioral disturbance: Secondary | ICD-10-CM | POA: Diagnosis not present

## 2019-02-24 DIAGNOSIS — Z515 Encounter for palliative care: Secondary | ICD-10-CM | POA: Diagnosis not present

## 2019-02-24 DIAGNOSIS — R509 Fever, unspecified: Secondary | ICD-10-CM | POA: Diagnosis not present

## 2019-02-24 DIAGNOSIS — Z8616 Personal history of covid-19: Secondary | ICD-10-CM | POA: Diagnosis not present

## 2019-02-24 LAB — COMPREHENSIVE METABOLIC PANEL
ALT: 14 U/L (ref 0–44)
AST: 17 U/L (ref 15–41)
Albumin: 2.3 g/dL — ABNORMAL LOW (ref 3.5–5.0)
Alkaline Phosphatase: 61 U/L (ref 38–126)
Anion gap: 12 (ref 5–15)
BUN: 8 mg/dL (ref 8–23)
CO2: 23 mmol/L (ref 22–32)
Calcium: 9 mg/dL (ref 8.9–10.3)
Chloride: 100 mmol/L (ref 98–111)
Creatinine, Ser: 0.78 mg/dL (ref 0.44–1.00)
GFR calc Af Amer: >60 mL/min (ref >60)
GFR calc non Af Amer: >60 mL/min (ref >60)
Glucose, Bld: 252 mg/dL — ABNORMAL HIGH (ref 70–99)
Potassium: 3.7 mmol/L (ref 3.5–5.1)
Sodium: 135 mmol/L (ref 135–145)
Total Bilirubin: 0.5 mg/dL (ref 0.3–1.2)
Total Protein: 6.2 g/dL — ABNORMAL LOW (ref 6.5–8.1)

## 2019-02-24 LAB — PROTIME-INR
INR: 1.1 (ref 0.8–1.2)
Prothrombin Time: 14.2 seconds (ref 11.4–15.2)

## 2019-02-24 LAB — GLUCOSE, CAPILLARY
Glucose-Capillary: 185 mg/dL — ABNORMAL HIGH (ref 70–99)
Glucose-Capillary: 187 mg/dL — ABNORMAL HIGH (ref 70–99)
Glucose-Capillary: 160 mg/dL — ABNORMAL HIGH (ref 70–99)
Glucose-Capillary: 228 mg/dL — ABNORMAL HIGH (ref 70–99)

## 2019-02-24 LAB — CBC WITH DIFFERENTIAL/PLATELET
Abs Immature Granulocytes: 0.04 10*3/uL (ref 0.00–0.07)
Basophils Absolute: 0 10*3/uL (ref 0.0–0.1)
Basophils Relative: 0 %
Eosinophils Absolute: 0.2 10*3/uL (ref 0.0–0.5)
Eosinophils Relative: 3 %
HCT: 35.8 % — ABNORMAL LOW (ref 36.0–46.0)
Hemoglobin: 11.7 g/dL — ABNORMAL LOW (ref 12.0–15.0)
Immature Granulocytes: 0 %
Lymphocytes Relative: 21 %
Lymphs Abs: 2 10*3/uL (ref 0.7–4.0)
MCH: 28.1 pg (ref 26.0–34.0)
MCHC: 32.7 g/dL (ref 30.0–36.0)
MCV: 85.9 fL (ref 80.0–100.0)
Monocytes Absolute: 1.1 10*3/uL — ABNORMAL HIGH (ref 0.1–1.0)
Monocytes Relative: 11 %
Neutro Abs: 6.2 10*3/uL (ref 1.7–7.7)
Neutrophils Relative %: 65 %
Platelets: 259 10*3/uL (ref 150–400)
RBC: 4.17 MIL/uL (ref 3.87–5.11)
RDW: 17.7 % — ABNORMAL HIGH (ref 11.5–15.5)
WBC: 9.5 10*3/uL (ref 4.0–10.5)
nRBC: 0 % (ref 0.0–0.2)

## 2019-02-24 LAB — LACTIC ACID, PLASMA
Lactic Acid, Venous: 1.2 mmol/L (ref 0.5–1.9)
Lactic Acid, Venous: 3 mmol/L (ref 0.5–1.9)
Lactic Acid, Venous: 1.6 mmol/L (ref 0.5–1.9)

## 2019-02-24 LAB — TROPONIN I (HIGH SENSITIVITY)
Troponin I (High Sensitivity): 3 ng/L (ref <18)
Troponin I (High Sensitivity): 4 ng/L (ref <18)

## 2019-02-24 LAB — PROCALCITONIN: Procalcitonin: 0.15 ng/mL

## 2019-02-24 LAB — BRAIN NATRIURETIC PEPTIDE: B Natriuretic Peptide: 76.1 pg/mL (ref 0.0–100.0)

## 2019-02-24 LAB — APTT: aPTT: 36 seconds (ref 24–36)

## 2019-02-24 MED ORDER — SODIUM CHLORIDE 0.9 % IV BOLUS (SEPSIS)
1000.0000 mL | Freq: Once | INTRAVENOUS | Status: AC
  Administered 2019-02-24: 1000 mL via INTRAVENOUS

## 2019-02-24 MED ORDER — ACETAMINOPHEN 325 MG PO TABS
650.0000 mg | ORAL_TABLET | Freq: Four times a day (QID) | ORAL | Status: DC | PRN
  Administered 2019-02-24 (×2): 650 mg via ORAL
  Filled 2019-02-24 (×2): qty 2

## 2019-02-24 MED ORDER — FUROSEMIDE 10 MG/ML IJ SOLN
40.0000 mg | Freq: Once | INTRAMUSCULAR | Status: AC
  Administered 2019-02-24: 40 mg via INTRAVENOUS
  Filled 2019-02-24: qty 4

## 2019-02-24 MED ORDER — METHYLPREDNISOLONE SODIUM SUCC 125 MG IJ SOLR
60.0000 mg | Freq: Two times a day (BID) | INTRAMUSCULAR | Status: DC
  Administered 2019-02-24 – 2019-03-02 (×12): 60 mg via INTRAVENOUS
  Filled 2019-02-24 (×12): qty 2

## 2019-02-24 MED ORDER — SODIUM CHLORIDE 0.9 % IV BOLUS (SEPSIS)
500.0000 mL | Freq: Once | INTRAVENOUS | Status: AC
  Administered 2019-02-24: 500 mL via INTRAVENOUS

## 2019-02-24 MED ORDER — CHLORHEXIDINE GLUCONATE CLOTH 2 % EX PADS
6.0000 | MEDICATED_PAD | Freq: Every day | CUTANEOUS | Status: DC
  Administered 2019-02-25 – 2019-03-02 (×6): 6 via TOPICAL

## 2019-02-24 MED ORDER — ALBUMIN HUMAN 25 % IV SOLN
25.0000 g | Freq: Four times a day (QID) | INTRAVENOUS | Status: AC
  Administered 2019-02-24 – 2019-02-25 (×4): 25 g via INTRAVENOUS
  Filled 2019-02-24 (×2): qty 100
  Filled 2019-02-24 (×3): qty 50
  Filled 2019-02-24: qty 100

## 2019-02-24 MED ORDER — POTASSIUM CHLORIDE 10 MEQ/100ML IV SOLN
10.0000 meq | INTRAVENOUS | Status: AC
  Administered 2019-02-24 (×4): 10 meq via INTRAVENOUS
  Filled 2019-02-24 (×4): qty 100

## 2019-02-24 MED ORDER — SODIUM CHLORIDE 0.9 % IV SOLN
1000.0000 mL | INTRAVENOUS | Status: DC
  Administered 2019-02-24: 1000 mL via INTRAVENOUS

## 2019-02-24 MED ORDER — BISACODYL 10 MG RE SUPP
10.0000 mg | Freq: Every day | RECTAL | Status: DC | PRN
  Administered 2019-02-24: 10 mg via RECTAL
  Filled 2019-02-24 (×2): qty 1

## 2019-02-24 MED ORDER — LEVALBUTEROL HCL 0.63 MG/3ML IN NEBU
0.6300 mg | INHALATION_SOLUTION | Freq: Four times a day (QID) | RESPIRATORY_TRACT | Status: DC | PRN
  Filled 2019-02-24: qty 3

## 2019-02-24 MED ORDER — IOHEXOL 350 MG/ML SOLN
100.0000 mL | Freq: Once | INTRAVENOUS | Status: AC | PRN
  Administered 2019-02-24: 100 mL via INTRAVENOUS

## 2019-02-24 MED ORDER — SODIUM CHLORIDE 0.9 % IV BOLUS (SEPSIS)
250.0000 mL | Freq: Once | INTRAVENOUS | Status: AC
  Administered 2019-02-24: 250 mL via INTRAVENOUS

## 2019-02-24 NOTE — Progress Notes (Signed)
Patient was alert and oriented with no complaints at approximately 0715 during report.  Notified by Lyndsay NT after [phone] that patient had low oxygen saturation.  Entered the room and sats were 70-80% on RA.  Patient was placed on 6 L [place] and began to recover, however, patient noted to also be febrile and tachycardic, with increased RR.  Dr. Benny Lennert was paged and ordered sepsis protocol.  Rapid Response nurse and charge nurse notified.  CT chest ordered.  Patient was able to come down to 3 LNC oxygen and hr decreased to 100, sats remained around 93% and RR decreased to 20.  4 runs of potassium given, and patient gone to CT.  Patient returned from CT with decreased sats requiring 10-12 liters oxygen, HR and RR elevated, temp beginning to climb again, max 100.4.  Dr. Benny Lennert paged again and orders received for Lasix, solumedrol, albumin, and pulmonary consult.  Rapid team notified again and charge nurse aware.  Daughter Kristen Colon has been at bedside since 10 am. Patient given valium and norco to assist with agitation and possible pain.   Provided support to daughter.  Lab Results WBC  Date/Time Value Ref Range Status  02/24/2019 09:07 AM 9.5 4.0 - 10.5 K/uL Final  02/23/2019 02:16 AM 8.8 4.0 - 10.5 K/uL Final  02/22/2019 01:56 AM 10.3 4.0 - 10.5 K/uL Final   Neutrophils Relative %  Date/Time Value Ref Range Status  02/24/2019 09:07 AM 65 % Final  02/23/2019 02:16 AM 63 % Final  02/21/2019 12:35 PM 77 % Final   No results found for: PCO2ART Lactic Acid, Venous  Date/Time Value Ref Range Status  02/24/2019 09:07 AM 1.2 0.5 - 1.9 mmol/L Final    Comment:    Performed at [facility], [place] [address] [place], [place] [address]  02/22/2019 02:55 PM 1.7 0.5 - 1.9 mmol/L Final    Comment:    Performed at [facility] [address], [place], [place] [address]  02/22/2019 10:01 AM 1.9 0.5 - 1.9 mmol/L Final    Comment:    Performed at [facility], [place] [address], [place], [place]  [address]   No results found for: PCO2VEN

## 2019-02-24 NOTE — Progress Notes (Signed)
PT Cancellation Note  Patient Details Name: MILAYA NOVOSEL MRN: [id number] DOB: 1950-08-28   Cancelled Treatment:    Reason Eval/Treat Not Completed: Medical issues which prohibited therapy. Spoke with RN, pt with tachycardia, awaiting chest CTA. Will follow-up for PT treatment as appropriate.  Mabeline Caras, PT, DPT Acute Rehabilitation Services  Pager [phone] Office [place] 02/24/2019, 3:05 PM

## 2019-02-24 NOTE — Plan of Care (Signed)
Plan of care   Spoke with patient and daugther who was in the room. Patient appeared rested and able to speak in full sentences. Mildly tachycardic but in no distress. Oriented to person only. After long conversation with daughter she opted to make patient DNR full. She doe snot want compression of defibrillation at this time. She would be okey with trial of intubation if needed. Plan for tonight is diuresis as patient appears volume overloaded and is hypertensive. Foley for stric I/O. Trial of bipap/cpap if needed as patient alert in room. Will stop maintenance fluids as well. Does not need escalation of care at this time.  Newell Coral DO Internal Medicine/Pediatrics Pulmonary and Critical Care Fellow PGY-6

## 2019-02-24 NOTE — Consult Note (Signed)
NAME:  Kristen Colon, MRN:  [id number], DOB:  [birth year], LOS: 2 ADMISSION DATE:  02/21/2019, CONSULTATION DATE:  2/4 REFERRING MD:  Dr Benny Lennert, CHIEF COMPLAINT:  hypoxia   Brief History   69 year old female with multiple medical issues admitted 2/1 with presumed sepsis. She went on to develop a new oxygen requirement escalating to 9L [place] and PCCM was consulted.   History of present illness   69 year old female with PMH as below, which is significant for Dementia, schizophrenia, and HTN. 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   She was admitted in early January of 2021 with COVID PNA and was treated with courses of decadron and remdesivir. She was discharged to SNF on room air with plans to go back on hospice upon discharge from SNF. She then presented to [facility] ED 2/1 from SNF with complaints of a sepsis syndrome with fevers up to 105F. She was hypotensive in the ED which responded to IVF. She was admitted to the hospitalist service with presumptive diagnosis of severe sepsis secondary to UTI and was treated with CTX. Initially she improved with this therapy and was being considered for discharge back to SNF on 2/4, however, she developed a new oxygen requirement, which quickly escalated to 9L [place]. PCCM was consulted for hypoxia.   Past Medical History   has a past medical history of Anxiety, Dementia ([place]), Dyspnea, Headache, Hypertension, and Schizophrenia ([place]).   Significant Hospital Events   2/1 admit for presumed urosepsis.  2/4 new O2 requirement up to 9L and PCCM consulted  Consults:    Procedures:    Significant Diagnostic Tests:  CTA chest 2/4 > No CT evidence of pulmonary embolism. Diffuse interstitial thickening which may represent sequelae associated with interstitial edema. Mild to moderate severity bilateral upper lobe and right lower lobe atelectasis and/or  infiltrate. Cholelithiasis.   Micro Data:  BC 2/1 > BC 2/2 >  Urine 2/2 > insignificant growth BC 2/4 >  Antimicrobials:  Cefepime 2/1 Vancomycin 2/1 Ceftriaxone 2/2 >    Interim history/subjective:    Objective   Blood pressure 136/76, pulse (!) 121, temperature 98.9 F (37.2 C), temperature source Oral, resp. rate (!) 28, height 5\' 5"  (1.651 m), weight 50 kg, SpO2 93 %.        Intake/Output Summary (Last 24 hours) at 02/24/2019 1811 Last data filed at 02/24/2019 1800 Gross per 24 hour  Intake --  Output 1000 ml  Net -1000 ml   Filed Weights   02/21/19 2212  Weight: 50 kg    Examination: General: Frail elderly female in mild respiratory distress at rest HENT: /AT, PERRL Lungs: Bibasilar crackles Cardiovascular: Tachy, regular, no MRG. No peripheral edema.  Abdomen: Soft, non-tender, non-distended Extremities: No acute deformity Neuro: Alert, disoriented, not far off baseline per daughter GU: Foley draining clear yellow urine.   Resolved Hospital Problem list     Assessment & Plan:   Acute hypoxemic respiratory failure: differential includes post COVID inflammatory process and pulmonary edema. Less likely infection, but cannot be ruled out. WBC and PCT unremarkable.  - supplemental O2 to keep SpO2 > 90% - Start steroids - Trial of diuresis - Check BNP, Troponin - continue ceftriaxone - Continue to monitor on progressive unit - Daughter endorses the patient would want intubation should her condition worsen, however, palliative care should probably get involved.  The patient has previously been on hospice and her prognosis would be quite poor if she declined to the level of requiring mechanical ventilation.   Metastatic ovarian cancer: followed at [facility], and [facility]. Has refused some, and has been declined for other therapies.  - see attending note.    Labs   CBC: Recent Labs  Lab 02/21/19 1235 02/22/19 0156 02/23/19 0216 02/24/19 0907  WBC 10.6* 10.3 8.8  9.5  NEUTROABS 8.2*  --  5.5 6.2  HGB 12.0 12.7 11.7* 11.7*  HCT 38.5 39.7 36.2 35.8*  MCV 90.6 87.4 86.0 85.9  PLT 303 253 222 [id number]    Basic Metabolic Panel: Recent Labs  Lab 02/21/19 1235 02/22/19 0156 02/22/19 0639 02/23/19 0216 02/24/19 0907  NA 133* 138 134* 135 135  K 3.0* 3.8 3.3* 3.5 3.7  CL 104 106 102 102 100  CO2 15* 18* 21* 22 23  GLUCOSE 337* 247* 189* 206* 252*  BUN 16 11 7* 6* 8  CREATININE 0.56 0.61 0.64 0.49 0.78  CALCIUM 8.9 8.8* 8.4* 8.6* 9.0   GFR: Estimated Creatinine Clearance: 52.4 mL/min (by C-G formula based on SCr of 0.78 mg/dL). Recent Labs  Lab 02/21/19 1235 02/21/19 1235 02/22/19 0156 02/22/19 0432 02/22/19 1001 02/22/19 1455 02/23/19 0216 02/24/19 0907 02/24/19 0924  PROCALCITON <0.10  --   --   --   --   --   --   --  0.15  WBC 10.6*  --  10.3  --   --   --  8.8 9.5  --   LATICACIDVEN 1.4   < >  --  2.7* 1.9 1.7  --  1.2  --    < > = values in this interval not displayed.    Liver Function Tests: Recent Labs  Lab 02/21/19 1235 02/24/19 0907  AST 10* 17  ALT 12 14  ALKPHOS 68 61  BILITOT 0.5 0.5  PROT 6.1* 6.2*  ALBUMIN 2.8* 2.3*   No results for input(s): LIPASE, AMYLASE in the last 168 hours. No results for input(s): AMMONIA in the last 168 hours.  ABG No results found for: PHART, PCO2ART, PO2ART, HCO3, TCO2, ACIDBASEDEF, O2SAT   Coagulation Profile: Recent Labs  Lab 02/24/19 0907  INR 1.1    Cardiac Enzymes: No results for input(s): CKTOTAL, CKMB, CKMBINDEX, TROPONINI in the last 168 hours.  HbA1C: Hgb A1c MFr Bld  Date/Time Value Ref Range Status  02/21/2019 04:02 PM 8.5 (H) 4.8 - 5.6 % Final    Comment:    (NOTE) Pre diabetes:          5.7%-6.4% Diabetes:              >6.4% Glycemic control for   <7.0% adults with diabetes     CBG: Recent Labs  Lab 02/23/19 1638 02/23/19 2035 02/24/19 0819 02/24/19 1222 02/24/19 1641  GLUCAP 134* 184* 185* 187* 160*    Review of Systems:   Unable as  patient is encephalopathic  Past Medical History  She,  has a past medical history of Anxiety, Dementia ([place]), Dyspnea, Headache, Hypertension, and Schizophrenia ([place]).   Surgical History    Past Surgical History:  Procedure Laterality Date  . DIAGNOSTIC LAPAROSCOPY     pelvic mass Dr. Denman George 06-30-17  . LAPAROSCOPY N/A 06/30/2017   Procedure: LAPAROSCOPY DIAGNOSTIC WITH BIOPSIES;  Surgeon: Everitt Amber, MD;  Location: [facility];  Service: Gynecology;  Laterality: N/A;     Social History   reports that she has been  smoking cigarettes. She has a 5.00 pack-year smoking history. She has never used smokeless tobacco. She reports previous alcohol use. She reports previous drug use. Drug: Marijuana.   Family History   Her family history includes Diabetes in her sister; Heart disease in her mother.   Allergies Allergies  Allergen Reactions  . Penicillins Other (See Comments)    Edema Did it involve swelling of the face/tongue/throat, SOB, or low BP? Unknown Did it involve sudden or severe rash/hives, skin peeling, or any reaction on the inside of your mouth or nose? Unknown Did you need to seek medical attention at a hospital or doctor's office? Unknown When did it last happen?unknown If all above answers are "NO", may proceed with cephalosporin use. Daughter was unaware of allergy  . 5-Alpha Reductase Inhibitors   . Percocet [Oxycodone-Acetaminophen] Itching     Home Medications  Prior to Admission medications   Medication Sig Start Date End Date Taking? Authorizing Provider  acetaminophen (TYLENOL) 500 MG tablet Take 500 mg by mouth every 8 (eight) hours as needed for mild pain.   Yes [provider]  amLODipine (NORVASC) 5 MG tablet Take 1 tablet (5 mg total) by mouth daily. 09/24/16  Yes Rai, Ripudeep K, MD  apixaban (ELIQUIS) 2.5 MG TABS tablet Take 2.5 mg by mouth 2 (two) times daily.   Yes [provider]  Ascorbic Acid (VITAMIN C) 500 MG CAPS Take 1,000 mg by  mouth daily.    Yes [provider]  baclofen (LIORESAL) 10 MG tablet Take 10 mg by mouth daily as needed for muscle spasms.   Yes [provider]  buPROPion (WELLBUTRIN) 75 MG tablet Take 150 mg by mouth daily.   Yes [provider]  Cholecalciferol (VITAMIN D) 50 MCG (2000 UT) tablet Take 2,000 Units by mouth daily.   Yes [provider]  cyclobenzaprine (FLEXERIL) 5 MG tablet Take 5 mg by mouth daily. 12/10/18  Yes [provider]  diazepam (VALIUM) 5 MG tablet Take 5 mg by mouth every 8 (eight) hours as needed for anxiety.   Yes [provider]  fentaNYL (DURAGESIC) 50 MCG/HR Place 1 patch onto the skin as directed. Every 48 hours 12/07/18  Yes [provider]  HYDROcodone-acetaminophen (NORCO/VICODIN) 5-325 MG tablet Take 1 tablet by mouth every 4 (four) hours as needed for moderate pain.   Yes [provider]  insulin lispro (HUMALOG) 100 UNIT/ML injection Inject 2-10 Units into the skin See admin instructions. Sliding scale: 150-200=2u, 201-250=4u, 251-300=6u, 301-350=8u, 251-400=10u, >=400 call MD   Yes [provider]  INVEGA SUSTENNA 156 MG/ML SUSP injection Inject 1 mL (156 mg total) into the muscle every 30 (thirty) days. 09/05/14  Yes Dorie Rank, MD  lactulose [facility]) 10 GM/15ML solution Take 10 g by mouth daily.  08/12/16  Yes [provider]  letrozole (FEMARA) 2.5 MG tablet Take 2.5 mg by mouth daily.   Yes [provider]  linaclotide (LINZESS) 290 MCG CAPS capsule Take 1 capsule (290 mcg total) by mouth daily. 04/16/17 02/21/19 Yes Arrien, Jimmy Picket, MD  lisinopril (PRINIVIL,ZESTRIL) 10 MG tablet Take 10 mg by mouth daily.    Yes [provider]  lubiprostone (AMITIZA) 24 MCG capsule Take 24 mcg by mouth daily.   Yes [provider]  memantine (NAMENDA) 5 MG tablet Take 5 mg by mouth 2 (two) times daily.   Yes [provider]  metFORMIN (GLUCOPHAGE) 500  MG tablet Take 500 mg by mouth daily with breakfast.  Yes [provider]  Multiple Vitamin (MULTIVITAMIN WITH MINERALS) TABS tablet Take 1 tablet by mouth daily.   Yes [provider]  ondansetron (ZOFRAN) 4 MG tablet Take 4 mg by mouth every 8 (eight) hours as needed for nausea or vomiting.   Yes [provider]  QUEtiapine (SEROQUEL) 100 MG tablet Take 100 mg by mouth every 12 (twelve) hours as needed (sleep/agitation).  12/27/18  Yes [provider]  risperiDONE (RISPERDAL) 0.25 MG tablet Take 0.25 mg by mouth 2 (two) times daily as needed (agitation).    Yes [provider]  senna (SENOKOT) 8.6 MG tablet Take 1 tablet by mouth 2 (two) times daily.   Yes [provider]  temazepam (RESTORIL) 15 MG capsule Take 15 mg by mouth at bedtime.   Yes [provider]  thiamine 100 MG tablet Take 1 tablet (100 mg total) by mouth daily. 09/24/16  Yes Rai, Ripudeep K, MD  zinc sulfate 220 (50 Zn) MG capsule Take 220 mg by mouth daily.   Yes [provider]     Georgann Housekeeper, AGACNP-BC [place]  See Amion for personal pager PCCM on call pager [phone]  02/24/2019 6:33 PM

## 2019-02-24 NOTE — Progress Notes (Addendum)
PROGRESS NOTE  Kristen Colon [id number] DOB: 11/24/1950 DOA: 02/21/2019 PCP: Everardo Beals, NP  Brief History   The patient is a 69 yr old woman who is a resident of a SNF who was sent to the ED for evaluation of fever and tachycardia. She had been diagnosed with COVID-19 on 01/27/2019 and discharged back to the [facility] on 02/08/2019. She carries a past medical history significant for hypertension, DM II, schizophrenia, left epithelial ovarian cancer, dementia, and anxiety.   In the ED the patient was found to be septic with HR in the 100's, Hypotension with systolic blood pressure in the 90's, She was requiring 2L O2 by nasal cannula. She was afebrile, although per the SNF, she had been febrile there. CXR demonstrated increasing upper lobe interstitial and perihilar ground glass opacities. Procalcitonin was negative. UA was positive for UTI. She has been started on Rocephin. Blood cultures x 2 and Urine cultures have been ordered.  The patient was initially admitted to 5W on telemetry. However, as she is now outside the 21 day window for COVID 19, she will be transferred to a telemetry bed. Today she remains tachycardic with HR 120-140. EKG is pending. Her Oxygen requirements have increased to 3L. She has been confused and agitated while on 5W.  This morning the patient has had greatly increased oxygen requirements, fevers, and hypotension. She has been placed on a sepsis protocol, CTA chest has been ordered. She has also been given tylenol.  Consultants  . None  Procedures  . None  Antibiotics   Anti-infectives (From admission, onward)   Start     Dose/Rate Route Frequency Ordered Stop   02/22/19 1500  vancomycin (VANCOREADY) IVPB 750 mg/150 mL  Status:  Discontinued     750 mg 150 mL/hr over 60 Minutes Intravenous Every 24 hours 02/21/19 1452 02/21/19 1957   02/22/19 1500  cefTRIAXone (ROCEPHIN) 1 g in sodium chloride 0.9 % 100 mL IVPB     1 g 200 mL/hr over 30 Minutes Intravenous  Every 24 hours 02/21/19 1957     02/21/19 1445  aztreonam (AZACTAM) 2 g in sodium chloride 0.9 % 100 mL IVPB  Status:  Discontinued     2 g 200 mL/hr over 30 Minutes Intravenous  Once 02/21/19 1432 02/21/19 1439   02/21/19 1445  ceFEPIme (MAXIPIME) 2 g in sodium chloride 0.9 % 100 mL IVPB  Status:  Discontinued     2 g 200 mL/hr over 30 Minutes Intravenous Every 8 hours 02/21/19 1444 02/21/19 1957   02/21/19 1445  vancomycin (VANCOCIN) IVPB 1000 mg/200 mL premix     1,000 mg 200 mL/hr over 60 Minutes Intravenous  Once 02/21/19 1444 02/21/19 1710     Subjective  The patient is awake, interactive and calm today. No new complaints.  Objective   Vitals:  Vitals:   02/24/19 1115 02/24/19 1130  BP: 103/61 93/66  Pulse: (!) 121 (!) 115  Resp:  18  Temp:  99.2 F (37.3 C)  SpO2: 94% 94%   Exam:  Constitutional:  . The patient is awake and alert. She does not appear to be in any acute distres. Respiratory:  . No increased work of breathing. . No wheezes, rales, or rhonchi . No tactile fremitus Cardiovascular:  . Regular rate and rhythm . No murmurs, ectopy, or gallups. . No lateral PMI. No thrills. Abdomen:  . Abdomen is soft, non-tender, non-distended . No hernias, masses, or organomegaly . Normoactive bowel sounds.  Musculoskeletal:  . No  cyanosis, clubbing, or edema Skin:  . No rashes, lesions, ulcers . palpation of skin: no induration or nodules Neurologic:  . CN 2-12 intact . She is moving all extremities Psychiatric: Pt is awake and alert.   I have personally reviewed the following:   Today's Data  . Vitals, CBC, BMP  Micro Data  . BC x 2 drawn on 2/1 and 2/2 have had no growth . Urine culture collected on 02/22/2019 has had insignificant growth.  Imaging  CXR (02/21/2019) : Increasing upper lobe predominant interstitial and perihilar ground-glass opacities. Findings could reflect interstitial edema or atypical viral pneumonia.  Cardiology Data  . EKG -  sinus tachycardia  Scheduled Meds: . buPROPion  150 mg Oral Daily  . cholecalciferol  2,000 Units Oral Daily  . cyclobenzaprine  5 mg Oral Daily  . enoxaparin (LOVENOX) injection  40 mg Subcutaneous Q24H  . fentaNYL  1 patch Transdermal Q48H  . insulin aspart  0-15 Units Subcutaneous TID WC  . insulin aspart  0-5 Units Subcutaneous QHS  . lactulose  10 g Oral Daily  . letrozole  2.5 mg Oral Daily  . linaclotide  290 mcg Oral QAC breakfast  . lubiprostone  24 mcg Oral Q breakfast  . memantine  5 mg Oral BID  . metoprolol tartrate  12.5 mg Oral BID  . multivitamin with minerals  1 tablet Oral Daily  . senna  1 tablet Oral BID  . temazepam  15 mg Oral QHS  . thiamine  100 mg Oral Daily   Continuous Infusions: . sodium chloride Stopped (02/21/19 1710)  . cefTRIAXone (ROCEPHIN)  IV 1 g (02/23/19 1518)    Active Problems:   Fever   Sepsis ([place])   LOS: 2 days   A & P  Sepsis: Tachycardia, hypotension, fever, lactic acidosis, hypoxia on admission. Likely due to UTI, although BC x 2 is pending, and CXR demonstrated opacities consistent with either pulmonary edema or infiltrated. procalcitonin is negative. Pt is receiving empiric Rocephin pending cultures. She is now outside the 21 day window for COVID-19. However, this morning the patient again is appearing septic with fever, hypotension, tachycardia. Sepsis protocol has been initiated. She She appears to be responding to a bolus. Monitor.  Acute hypoxic respiratory failure: CXR from 02/21/2019 demonstrated bibasilar opacities consistent with eith3er pulmonary edema or infiltrates. She is receiving empiric rocephin and azithromycin. CTA chest has been orderd to rule out pulmonary embolus and further investigate worsening hypoxia. She is currently saturating in the low 90's on 6L O2.  Tachycardia: Continued fast HR today. EKG demonstrates sinus tachycardia with hypotension. Low blood pressure limits attempts to lower heart rate with beta  blockade. I had discontinued lisinopril and started metoprolol, however now her blood pressures will not tolerate either.   DM II: Elevated glucoses due to steroids which are now completed. FSBS for the last 24 hours have run between 117 - 231. She is receiving correction insulin only. I will add lantus 12 units daily. Continue to monitor. Hold metformin while inpatient.   Hypotension: Limits attempts to control HR through beta blockers.   Chronic constipation: Miralax bid. Monitor.  Dementia: Pt has been quite agitated and confused, but looks much better today. Continue seroquel and risperdal as at home.   I have seen and examined this patient myself. I have spent 38 minutes in her evaluation and care.  Dontrez Pettis, DO [facility] Direct contact: see [email or web address]  7PM-7AM contact night coverage as above 02/23/2019, 11:01 PM  LOS: 0 days

## 2019-02-24 NOTE — Progress Notes (Signed)
Patient seen and examined. Seen for worsening respiratory distress, sudden onset. Patient confused, baseline dementia/schizophrenia. Daughter at bedside. Denies any aspiration events, just progressive dyspnea. Given a fair bit of crystalloid as part of her code sepsis. Given rapidity of worsening O2 requirements, she was sent for CTA chest (already on [facility]) which showed no PE and severe emphysema, scattered volume loss vs. Infiltrates in an almost NSIP pattern with diffuse GGO c/w pulmonary edema. Has low grade fever, anxiety, borderline blood pressures. Pct neg BNP neg 3 days ago Constipated also Imaging at [facility] shows large peritoneal metastatic burden and large adnexal mass creating mass effect on bowel loops without clear obstruction (Oct 2020). She is in hospice but full code, I confirmed this with daughter.  See oncologic history in assessment below.  So in total we have: - Acute hypoxemic respiratory failure with history concerning for pulmonary edema vs. Inflammatory process like aspiration for which patient is very high risk.  Also post COVID which can induce an infllammatory picture as well but this is usually slow in onset - Agitation/delirium in patient with baseline severe cognitive deficits presumed dementia plus schizophrenia. - Progressive metastatic ovarian cancer on hospice but not comfort care?  Daughter has taken patient off chemo in past and wants more holistic approaches like high dose vitamin C per chart review.  Apparently the [facility] de[facility] endorsed this approach- see note from Dr. Owens Shark 05/20/18 - UTI  P: -Lasix is fine, keep an eye on potassium -Okay to do trial of steroids but this may make delirium worse -I guess continue home opiates and benzodiazepines to prevent withdraw from clouding picture. -Fine to continue ceftriaxone -There are no good outcomes here. -Patient needs to be made comfortable and allowed to pass with dignity but I do not  think daughter understands this.  Will ask palliative care to help with this deficit.  31 minutes critical care  Erskine Emery MD

## 2019-02-24 NOTE — Significant Event (Signed)
Rapid Response Rounding Note  Overview: Sepsis protocol started on Mrs. Working this morning. Rounded on Mrs. Joiner to find her awake, lying in bed. Pt warm, dry to touch. Temperature now 98.1F orally. Pt tachycardic at 118 bpm, BP 100/59, RR 24, SpO2 92-96% on 3LNC. Lung sounds are clear and bowel sounds are active. Pt just finished a 1.5L bolus. Pt awake and communicating with staff, but easily falls asleep. Pt daughter at bedside and concerned in regards to pt's overall appearance and fatigue. IV access established for CTA chest to be completed this morning. We asked her primary RN, Sharyn Lull, to contact Dr. Benny Lennert and request MD discuss imaging and lab findings, as well as plan of care of patient with pt's daughter.  RN to call rapid response for further needs.   Casimer Bilis

## 2019-02-25 ENCOUNTER — Inpatient Hospital Stay (HOSPITAL_COMMUNITY): Payer: Medicare Other

## 2019-02-25 DIAGNOSIS — I959 Hypotension, unspecified: Secondary | ICD-10-CM | POA: Diagnosis not present

## 2019-02-25 DIAGNOSIS — R0602 Shortness of breath: Secondary | ICD-10-CM | POA: Diagnosis not present

## 2019-02-25 DIAGNOSIS — F0391 Unspecified dementia with behavioral disturbance: Secondary | ICD-10-CM | POA: Diagnosis not present

## 2019-02-25 DIAGNOSIS — R509 Fever, unspecified: Secondary | ICD-10-CM | POA: Diagnosis not present

## 2019-02-25 DIAGNOSIS — A419 Sepsis, unspecified organism: Secondary | ICD-10-CM | POA: Diagnosis not present

## 2019-02-25 DIAGNOSIS — R69 Illness, unspecified: Secondary | ICD-10-CM | POA: Diagnosis not present

## 2019-02-25 DIAGNOSIS — N390 Urinary tract infection, site not specified: Secondary | ICD-10-CM | POA: Diagnosis not present

## 2019-02-25 LAB — CBC WITH DIFFERENTIAL/PLATELET
Abs Immature Granulocytes: 0.05 10*3/uL (ref 0.00–0.07)
Basophils Absolute: 0 10*3/uL (ref 0.0–0.1)
Basophils Relative: 0 %
Eosinophils Absolute: 0 10*3/uL (ref 0.0–0.5)
Eosinophils Relative: 0 %
HCT: 33 % — ABNORMAL LOW (ref 36.0–46.0)
Hemoglobin: 10.8 g/dL — ABNORMAL LOW (ref 12.0–15.0)
Immature Granulocytes: 0 %
Lymphocytes Relative: 6 %
Lymphs Abs: 0.7 10*3/uL (ref 0.7–4.0)
MCH: 27.8 pg (ref 26.0–34.0)
MCHC: 32.7 g/dL (ref 30.0–36.0)
MCV: 85.1 fL (ref 80.0–100.0)
Monocytes Absolute: 0.1 10*3/uL (ref 0.1–1.0)
Monocytes Relative: 1 %
Neutro Abs: 10.4 10*3/uL — ABNORMAL HIGH (ref 1.7–7.7)
Neutrophils Relative %: 93 %
Platelets: 262 10*3/uL (ref 150–400)
RBC: 3.88 MIL/uL (ref 3.87–5.11)
RDW: 17.3 % — ABNORMAL HIGH (ref 11.5–15.5)
WBC: 11.2 10*3/uL — ABNORMAL HIGH (ref 4.0–10.5)
nRBC: 0 % (ref 0.0–0.2)

## 2019-02-25 LAB — PROCALCITONIN: Procalcitonin: 1.59 ng/mL

## 2019-02-25 LAB — BASIC METABOLIC PANEL
Anion gap: 15 (ref 5–15)
BUN: 10 mg/dL (ref 8–23)
CO2: 22 mmol/L (ref 22–32)
Calcium: 9 mg/dL (ref 8.9–10.3)
Chloride: 98 mmol/L (ref 98–111)
Creatinine, Ser: 0.62 mg/dL (ref 0.44–1.00)
GFR calc Af Amer: >60 mL/min (ref >60)
GFR calc non Af Amer: >60 mL/min (ref >60)
Glucose, Bld: 324 mg/dL — ABNORMAL HIGH (ref 70–99)
Potassium: 4 mmol/L (ref 3.5–5.1)
Sodium: 135 mmol/L (ref 135–145)

## 2019-02-25 LAB — ECHOCARDIOGRAM COMPLETE
Height: 65 in
Weight: 1763.68 oz

## 2019-02-25 LAB — GLUCOSE, CAPILLARY
Glucose-Capillary: 324 mg/dL — ABNORMAL HIGH (ref 70–99)
Glucose-Capillary: 435 mg/dL — ABNORMAL HIGH (ref 70–99)
Glucose-Capillary: 369 mg/dL — ABNORMAL HIGH (ref 70–99)
Glucose-Capillary: 308 mg/dL — ABNORMAL HIGH (ref 70–99)

## 2019-02-25 LAB — MAGNESIUM: Magnesium: 1.5 mg/dL — ABNORMAL LOW (ref 1.7–2.4)

## 2019-02-25 MED ORDER — INSULIN GLARGINE 100 UNIT/ML ~~LOC~~ SOLN
20.0000 [IU] | Freq: Every day | SUBCUTANEOUS | Status: DC
  Filled 2019-02-25 (×2): qty 0.2

## 2019-02-25 MED ORDER — METOCLOPRAMIDE HCL 5 MG/ML IJ SOLN
5.0000 mg | Freq: Three times a day (TID) | INTRAMUSCULAR | Status: DC
  Administered 2019-02-25 – 2019-03-03 (×23): 5 mg via INTRAVENOUS
  Filled 2019-02-25 (×24): qty 2

## 2019-02-25 MED ORDER — SODIUM CHLORIDE 0.9 % IV SOLN
500.0000 mg | INTRAVENOUS | Status: DC
  Administered 2019-02-25: 500 mg via INTRAVENOUS
  Filled 2019-02-25 (×2): qty 500

## 2019-02-25 MED ORDER — FUROSEMIDE 10 MG/ML IJ SOLN
20.0000 mg | Freq: Once | INTRAMUSCULAR | Status: AC
  Administered 2019-02-25: 20 mg via INTRAVENOUS
  Filled 2019-02-25: qty 2

## 2019-02-25 MED ORDER — INSULIN ASPART 100 UNIT/ML ~~LOC~~ SOLN
15.0000 [IU] | Freq: Once | SUBCUTANEOUS | Status: AC
  Administered 2019-02-25: 15 [IU] via SUBCUTANEOUS

## 2019-02-25 MED ORDER — MAGNESIUM SULFATE 2 GM/50ML IV SOLN
2.0000 g | Freq: Once | INTRAVENOUS | Status: AC
  Administered 2019-02-25: 2 g via INTRAVENOUS
  Filled 2019-02-25: qty 50

## 2019-02-25 NOTE — Progress Notes (Signed)
NAME:  Kristen Colon, MRN:  [id number], DOB:  06-Nov-1950, LOS: 3 ADMISSION DATE:  02/21/2019, CONSULTATION DATE:  2/4 REFERRING MD:  Dr Benny Lennert, CHIEF COMPLAINT:  hypoxia   Brief History   69 year old female with multiple medical issues admitted 2/1 with presumed sepsis. She went on to develop a new oxygen requirement escalating to 9L [place] and PCCM was consulted.   History of present illness   69 year old female with PMH as below, which is significant for Dementia, schizophrenia, and HTN. 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   She was admitted in early January of 2021 with COVID PNA and was treated with courses of decadron and remdesivir. She was discharged to SNF on room air with plans to go back on hospice upon discharge from SNF. She then presented to [facility] ED 2/1 from SNF with complaints of a sepsis syndrome with fevers up to 105F. She was hypotensive in the ED which responded to IVF. She was admitted to the hospitalist service with presumptive diagnosis of severe sepsis secondary to UTI and was treated with CTX. Initially she improved with this therapy and was being considered for discharge back to SNF on 2/4, however, she developed a new oxygen requirement, which quickly escalated to 9L [place]. PCCM was consulted for hypoxia.   Past Medical History   has a past medical history of Anxiety, Dementia ([place]), Dyspnea, Headache, Hypertension, and Schizophrenia ([place]).   Significant Hospital Events   2/1 admit for presumed urosepsis.  2/4 new O2 requirement up to 9L and PCCM consulted  Consults:    Procedures:    Significant Diagnostic Tests:  CTA chest 2/4 > No CT evidence of pulmonary embolism. Diffuse interstitial thickening which may represent sequelae associated with interstitial edema. Mild to moderate severity bilateral upper lobe and right lower lobe atelectasis and/or  infiltrate. Cholelithiasis.   Micro Data:  BC 2/1 > BC 2/2 >  Urine 2/2 > insignificant growth BC 2/4 >   Antimicrobials:  Cefepime 2/1 Vancomycin 2/1 Ceftriaxone 2/2 >    Interim history/subjective:  Breathing much better this morning  Objective   Blood pressure 121/71, pulse 90, temperature 97.9 F (36.6 C), temperature source Oral, resp. rate 15, height 5\' 5"  (1.651 m), weight 50 kg, SpO2 100 %.    FiO2 (%):  [98 %] 98 %   Intake/Output Summary (Last 24 hours) at 02/25/2019 0851 Last data filed at 02/25/2019 0600 Gross per 24 hour  Intake 2638.9 ml  Output 3500 ml  Net -861.1 ml   Filed Weights   02/21/19 2212  Weight: 50 kg    Examination: General: Frail elderly female in mild respiratory distress at rest HENT: [place]/AT, PERRL Lungs: Bibasilar crackles Cardiovascular: Tachy, regular, no MRG. No peripheral edema.  Abdomen: Soft, non-tender, non-distended Extremities: No acute deformity Neuro: Alert, disoriented, not far off baseline per daughter GU: Foley draining clear yellow urine.   Resolved Hospital Problem list     Assessment & Plan:   Acute hypoxemic respiratory failure: differential includes post COVID inflammatory process and pulmonary edema. Less likely infection, but cannot be ruled out. WBC and PCT unremarkable.  - Supplemental O2 to keep SpO2 > 90%. HFNC currently - Short duration of steroids - Diurese as tolerated - Check BNP, Troponin - Continue ceftriaxone - Continue to monitor on progressive unit - Daughter endorses the patient would  want intubation should her condition worsen, however, palliative care should probably get involved. The patient has previously been on hospice and her prognosis would be quite poor if she declined to the level of requiring mechanical ventilation.  - No CPR should she arrest. Discussed with PCCM night team.     Labs   CBC: Recent Labs  Lab 02/21/19 1235 02/22/19 0156 02/23/19 0216 02/24/19 0907  02/25/19 0212  WBC 10.6* 10.3 8.8 9.5 11.2*  NEUTROABS 8.2*  --  5.5 6.2 10.4*  HGB 12.0 12.7 11.7* 11.7* 10.8*  HCT 38.5 39.7 36.2 35.8* 33.0*  MCV 90.6 87.4 86.0 85.9 85.1  PLT 303 253 222 259 [id number]    Basic Metabolic Panel: Recent Labs  Lab 02/22/19 0156 02/22/19 0639 02/23/19 0216 02/24/19 0907 02/25/19 0212  NA 138 134* 135 135 135  K 3.8 3.3* 3.5 3.7 4.0  CL 106 102 102 100 98  CO2 18* 21* 22 23 22   GLUCOSE 247* 189* 206* 252* 324*  BUN 11 7* 6* 8 10  CREATININE 0.61 0.64 0.49 0.78 0.62  CALCIUM 8.8* 8.4* 8.6* 9.0 9.0  MG  --   --   --   --  1.5*   GFR: Estimated Creatinine Clearance: 52.4 mL/min (by C-G formula based on SCr of 0.62 mg/dL). Recent Labs  Lab 02/21/19 1235 02/21/19 1235 02/22/19 0156 02/22/19 0432 02/22/19 1455 02/23/19 0216 02/24/19 0907 02/24/19 0924 02/24/19 1831 02/24/19 2017 02/25/19 0212  PROCALCITON <0.10  --   --   --   --   --   --  0.15  --   --  1.59  WBC 10.6*   < > 10.3  --   --  8.8 9.5  --   --   --  11.2*  LATICACIDVEN 1.4  --   --    < > 1.7  --  1.2  --  3.0* 1.6  --    < > = values in this interval not displayed.    Liver Function Tests: Recent Labs  Lab 02/21/19 1235 02/24/19 0907  AST 10* 17  ALT 12 14  ALKPHOS 68 61  BILITOT 0.5 0.5  PROT 6.1* 6.2*  ALBUMIN 2.8* 2.3*   No results for input(s): LIPASE, AMYLASE in the last 168 hours. No results for input(s): AMMONIA in the last 168 hours.  ABG No results found for: PHART, PCO2ART, PO2ART, HCO3, TCO2, ACIDBASEDEF, O2SAT   Coagulation Profile: Recent Labs  Lab 02/24/19 0907  INR 1.1    Cardiac Enzymes: No results for input(s): CKTOTAL, CKMB, CKMBINDEX, TROPONINI in the last 168 hours.  HbA1C: Hgb A1c MFr Bld  Date/Time Value Ref Range Status  02/21/2019 04:02 PM 8.5 (H) 4.8 - 5.6 % Final    Comment:    (NOTE) Pre diabetes:          5.7%-6.4% Diabetes:              >6.4% Glycemic control for   <7.0% adults with diabetes     CBG: Recent Labs   Lab 02/24/19 0819 02/24/19 1222 02/24/19 1641 02/24/19 2120 02/25/19 0720  GLUCAP 185* 187* 160* 228* 324*    Review of Systems:   Unable as patient is encephalopathic  Past Medical History  She,  has a past medical history of Anxiety, Dementia ([place]), Dyspnea, Headache, Hypertension, and Schizophrenia ([place]).   Surgical History    Past Surgical History:  Procedure Laterality Date  . DIAGNOSTIC LAPAROSCOPY     pelvic mass  Dr. Denman George 06-30-17  . LAPAROSCOPY N/A 06/30/2017   Procedure: LAPAROSCOPY DIAGNOSTIC WITH BIOPSIES;  Surgeon: Everitt Amber, MD;  Location: [facility];  Service: Gynecology;  Laterality: N/A;     Social History   reports that she has been smoking cigarettes. She has a 5.00 pack-year smoking history. She has never used smokeless tobacco. She reports previous alcohol use. She reports previous drug use. Drug: Marijuana.   Family History   Her family history includes Diabetes in her sister; Heart disease in her mother.   Allergies Allergies  Allergen Reactions  . Penicillins Other (See Comments)    Edema Did it involve swelling of the face/tongue/throat, SOB, or low BP? Unknown Did it involve sudden or severe rash/hives, skin peeling, or any reaction on the inside of your mouth or nose? Unknown Did you need to seek medical attention at a hospital or doctor's office? Unknown When did it last happen?unknown If all above answers are "NO", may proceed with cephalosporin use. Daughter was unaware of allergy  . 5-Alpha Reductase Inhibitors   . Percocet [Oxycodone-Acetaminophen] Itching     Home Medications  Prior to Admission medications   Medication Sig Start Date End Date Taking? Authorizing Provider  acetaminophen (TYLENOL) 500 MG tablet Take 500 mg by mouth every 8 (eight) hours as needed for mild pain.   Yes [provider]  amLODipine (NORVASC) 5 MG tablet Take 1 tablet (5 mg total) by mouth daily. 09/24/16  Yes Rai, Ripudeep K, MD  apixaban  (ELIQUIS) 2.5 MG TABS tablet Take 2.5 mg by mouth 2 (two) times daily.   Yes [provider]  Ascorbic Acid (VITAMIN C) 500 MG CAPS Take 1,000 mg by mouth daily.    Yes [provider]  baclofen (LIORESAL) 10 MG tablet Take 10 mg by mouth daily as needed for muscle spasms.   Yes [provider]  buPROPion (WELLBUTRIN) 75 MG tablet Take 150 mg by mouth daily.   Yes [provider]  Cholecalciferol (VITAMIN D) 50 MCG (2000 UT) tablet Take 2,000 Units by mouth daily.   Yes [provider]  cyclobenzaprine (FLEXERIL) 5 MG tablet Take 5 mg by mouth daily. 12/10/18  Yes [provider]  diazepam (VALIUM) 5 MG tablet Take 5 mg by mouth every 8 (eight) hours as needed for anxiety.   Yes [provider]  fentaNYL (DURAGESIC) 50 MCG/HR Place 1 patch onto the skin as directed. Every 48 hours 12/07/18  Yes [provider]  HYDROcodone-acetaminophen (NORCO/VICODIN) 5-325 MG tablet Take 1 tablet by mouth every 4 (four) hours as needed for moderate pain.   Yes [provider]  insulin lispro (HUMALOG) 100 UNIT/ML injection Inject 2-10 Units into the skin See admin instructions. Sliding scale: 150-200=2u, 201-250=4u, 251-300=6u, 301-350=8u, 251-400=10u, >=400 call MD   Yes [provider]  INVEGA SUSTENNA 156 MG/ML SUSP injection Inject 1 mL (156 mg total) into the muscle every 30 (thirty) days. 09/05/14  Yes Dorie Rank, MD  lactulose [facility]) 10 GM/15ML solution Take 10 g by mouth daily.  08/12/16  Yes [provider]  letrozole (FEMARA) 2.5 MG tablet Take 2.5 mg by mouth daily.   Yes [provider]  linaclotide (LINZESS) 290 MCG CAPS capsule Take 1 capsule (290 mcg total) by mouth daily. 04/16/17 02/21/19 Yes Arrien, Jimmy Picket, MD  lisinopril (PRINIVIL,ZESTRIL) 10 MG tablet Take 10 mg by mouth daily.    Yes [provider]  lubiprostone (AMITIZA) 24 MCG capsule Take 24 mcg by mouth  daily.   Yes  [provider]  memantine (NAMENDA) 5 MG tablet Take 5 mg by mouth 2 (two) times daily.   Yes [provider]  metFORMIN (GLUCOPHAGE) 500 MG tablet Take 500 mg by mouth daily with breakfast.    Yes [provider]  Multiple Vitamin (MULTIVITAMIN WITH MINERALS) TABS tablet Take 1 tablet by mouth daily.   Yes [provider]  ondansetron (ZOFRAN) 4 MG tablet Take 4 mg by mouth every 8 (eight) hours as needed for nausea or vomiting.   Yes [provider]  QUEtiapine (SEROQUEL) 100 MG tablet Take 100 mg by mouth every 12 (twelve) hours as needed (sleep/agitation).  12/27/18  Yes [provider]  risperiDONE (RISPERDAL) 0.25 MG tablet Take 0.25 mg by mouth 2 (two) times daily as needed (agitation).    Yes [provider]  senna (SENOKOT) 8.6 MG tablet Take 1 tablet by mouth 2 (two) times daily.   Yes [provider]  temazepam (RESTORIL) 15 MG capsule Take 15 mg by mouth at bedtime.   Yes [provider]  thiamine 100 MG tablet Take 1 tablet (100 mg total) by mouth daily. 09/24/16  Yes Rai, Ripudeep K, MD  zinc sulfate 220 (50 Zn) MG capsule Take 220 mg by mouth daily.   Yes [provider]     Georgann Housekeeper, AGACNP-BC [place]  See Amion for personal pager PCCM on call pager [phone]  02/25/2019 8:51 AM

## 2019-02-25 NOTE — Progress Notes (Signed)
Pt declined soap sud enema. Pt reported having a large loose BM when working with PT. RN reported this to MD. Order d/c'd.  Throughout afternoon pt continued to state her right to decline medication. RN continued to explain what each medication was for and why she was receiving it. She would verbalize understanding, but also state that she did not wish to pursue active treatment and to take "so many pills". RN reported findings to MD. She reported discussing goals of care with pt's daughter today. RN made DNR. With pt continuing to voice her wishes not to have active treatment. MD made referral to palliative care.   When pt's daughter arrived to visit. RN informed her of consult. Daughter emotional and stating that if pt refuses to take her medication to call her,and she will make her take medication. Daughter did not understand reason for Palliative consult. RN explained that this would be a way to discuss goals of care and pt's prognosis. Daughter verbalized understanding. RN provided emotional support to her.   RN will continue to assess pt.

## 2019-02-25 NOTE — Progress Notes (Signed)
OT Cancellation Note  Patient Details Name: Kristen Colon MRN: [id number] DOB: Jan 04, 1951   Cancelled Treatment:    Reason Eval/Treat Not Completed: Fatigue/lethargy limiting ability to participate. Pt. Reports she has already had PT/ST today and is resting. "maybe not today and lets try another day if that is okay".    Daiva Huge Lorraine-COTA/L 02/25/2019, 1:03 PM

## 2019-02-25 NOTE — Progress Notes (Signed)
  Echocardiogram 2D Echocardiogram has been performed.  Kristen Colon 02/25/2019, 10:51 AM

## 2019-02-25 NOTE — Progress Notes (Addendum)
PROGRESS NOTE  Kristen Colon [id number] DOB: June 22, 1950 DOA: 02/21/2019 PCP: Everardo Beals, NP  Brief History   The patient is a 69 yr old woman who is a resident of a SNF who was sent to the ED for evaluation of fever and tachycardia. She had been diagnosed with COVID-19 on 01/27/2019 and discharged back to the [facility] on 02/08/2019. She carries a past medical history significant for hypertension, DM II, schizophrenia, left epithelial ovarian cancer, dementia, and anxiety.   In the ED the patient was found to be septic with HR in the 100'Kristen, Hypotension with systolic blood pressure in the 90'Kristen, She was requiring 2L O2 by nasal cannula. She was afebrile, although per the SNF, she had been febrile there. CXR demonstrated increasing upper lobe interstitial and perihilar ground glass opacities. Procalcitonin was negative. UA was positive for UTI. She has been started on Rocephin. Blood cultures x 2 and Urine cultures have been ordered.  The patient was initially admitted to 5W on telemetry. However, as she is now outside the 21 day window for COVID 19, she will be transferred to a telemetry bed. Today she remains tachycardic with HR 120-140. EKG is pending. Her Oxygen requirements have increased to 3L. She has been confused and agitated while on 5W.  This morning the patient has had greatly increased oxygen requirements, fevers, and hypotension. She has been placed on a sepsis protocol, CTA chest was performed and was consistent with volume overload. However, pt had greatly increased oxygen requirements. Pulmonology was consulted. They also recommended lasix, trial of steroids, as well as chest physiotherapy. Rocephin has been continued. Azithromycin has been added.  I have discussed the patient with her Daughter, Kristen Colon. Kristen Colon has decided to make her Mom a DNR. Consultants  . PCCM  Procedures  . None  Antibiotics   Anti-infectives (From admission, onward)   Start     Dose/Rate Route Frequency  Ordered Stop   02/22/19 1500  vancomycin (VANCOREADY) IVPB 750 mg/150 mL  Status:  Discontinued     750 mg 150 mL/hr over 60 Minutes Intravenous Every 24 hours 02/21/19 1452 02/21/19 1957   02/22/19 1500  cefTRIAXone (ROCEPHIN) 1 g in sodium chloride 0.9 % 100 mL IVPB     1 g 200 mL/hr over 30 Minutes Intravenous Every 24 hours 02/21/19 1957     02/21/19 1445  aztreonam (AZACTAM) 2 g in sodium chloride 0.9 % 100 mL IVPB  Status:  Discontinued     2 g 200 mL/hr over 30 Minutes Intravenous  Once 02/21/19 1432 02/21/19 1439   02/21/19 1445  ceFEPIme (MAXIPIME) 2 g in sodium chloride 0.9 % 100 mL IVPB  Status:  Discontinued     2 g 200 mL/hr over 30 Minutes Intravenous Every 8 hours 02/21/19 1444 02/21/19 1957   02/21/19 1445  vancomycin (VANCOCIN) IVPB 1000 mg/200 mL premix     1,000 mg 200 mL/hr over 60 Minutes Intravenous  Once 02/21/19 1444 02/21/19 1710     Subjective  The patient is awake, interactive and calm today. No new complaints.  Objective   Vitals:  Vitals:   02/25/19 0731 02/25/19 1200  BP:  119/72  Pulse: 90 98  Resp: 15 17  Temp:  (!) 96.8 F (36 C)  SpO2: 100% 98%   Exam:  Constitutional:  . The patient is awake and alert. She does not appear to be in any acute distres. Respiratory:  . No increased work of breathing. . No wheezes, rales, or rhonchi .  No tactile fremitus Cardiovascular:  . Regular rate and rhythm . No murmurs, ectopy, or gallups. . No lateral PMI. No thrills. Abdomen:  . Abdomen is soft, non-tender, non-distended . No hernias, masses, or organomegaly . Normoactive bowel sounds.  Musculoskeletal:  . No cyanosis, clubbing, or edema Skin:  . No rashes, lesions, ulcers . palpation of skin: no induration or nodules Neurologic:  . CN 2-12 intact . She is moving all extremities Psychiatric: Pt is awake and alert.   I have personally reviewed the following:   Today'Kristen Data  . Vitals, CBC, BMP  Micro Data  . BC x 2 drawn on 2/1 and  2/2 have had no growth . Urine culture collected on 02/22/2019 has had insignificant growth.  Imaging  CXR (02/21/2019) : Increasing upper lobe predominant interstitial and perihilar ground-glass opacities. Findings could reflect interstitial edema or atypical viral pneumonia. CTA Chest: Bilateral upper and lower lobe opacities. No PE.   Cardiology Data  . EKG - sinus tachycardia  Scheduled Meds: . buPROPion  150 mg Oral Daily  . Chlorhexidine Gluconate Cloth  6 each Topical Daily  . cholecalciferol  2,000 Units Oral Daily  . cyclobenzaprine  5 mg Oral Daily  . enoxaparin (LOVENOX) injection  40 mg Subcutaneous Q24H  . fentaNYL  1 patch Transdermal Q48H  . insulin aspart  0-15 Units Subcutaneous TID WC  . insulin aspart  0-5 Units Subcutaneous QHS  . insulin aspart  15 Units Subcutaneous Once  . insulin glargine  20 Units Subcutaneous Daily  . lactulose  10 g Oral Daily  . letrozole  2.5 mg Oral Daily  . linaclotide  290 mcg Oral QAC breakfast  . lubiprostone  24 mcg Oral Q breakfast  . memantine  5 mg Oral BID  . methylPREDNISolone (SOLU-MEDROL) injection  60 mg Intravenous Q12H  . metoprolol tartrate  12.5 mg Oral BID  . multivitamin with minerals  1 tablet Oral Daily  . senna  1 tablet Oral BID  . temazepam  15 mg Oral QHS  . thiamine  100 mg Oral Daily   Continuous Infusions: . sodium chloride 1,000 mL (02/24/19 2000)  . albumin human 25 g (02/25/19 1102)  . cefTRIAXone (ROCEPHIN)  IV 1 g (02/24/19 1421)    Active Problems:   Fever   Sepsis ([place])   LOS: 3 days   A & P  Sepsis: Tachycardia, hypotension, fever, lactic acidosis, hypoxia on admission. Likely due to UTI, although BC x 2 is pending, and CXR demonstrated opacities consistent with either pulmonary edema or infiltrated. procalcitonin is negative. Pt is receiving empiric Rocephin pending cultures. She is now outside the 21 day window for COVID-19. However, this morning the patient again is appearing septic with  fever, hypotension, tachycardia. Sepsis protocol was initiated. Although her blood pressure initially responded to bolus, the patient'Kristen oxygen requirements have increased. This has not improved with attempts at diuresis.  Acute hypoxic respiratory failure: CXR from 02/21/2019 demonstrated bibasilar opacities consistent with eith3er pulmonary edema or infiltrates. She is receiving empiric rocephin and azithromycin. CTA chest has been orderd to rule out pulmonary embolus and further investigate worsening hypoxia. She is currently saturating in the mid 90'Kristen on 10L HFNC O2. She is receiving empiric Rocephin and Azithromycin. She is getting IV steroids. Will continue to attempt to diurese.  Tachycardia: Continued fast HR today. EKG demonstrates sinus tachycardia with hypotension. Low blood pressure limits attempts to lower heart rate with beta blockade. I had discontinued lisinopril and started metoprolol,  however now her blood pressures will not tolerate either.   DM II: Elevated glucoses due to steroids which have been restarted. FSBS for the last 24 hours have run between 160 - 435. She is receiving correction insulin only. Lantus 20 units daily has been added. Continue to monitor. Hold metformin while inpatient.   Hypotension: Resolved.   Chronic constipation: Miralax bid. Monitor.  Dementia: Pt had been quite agitated and confused, but has been more calm in the last 2-3 days. Continue seroquel and risperdal as at home.   I have seen and examined this patient myself. I have spent 48 minutes in her evaluation and care. More than 50% of this has been spent in counseling with the patient'Kristen daughter Kristen Colon. All questions answered to the best of my ability.  Moosa Bueche, DO [facility] Direct contact: see [email or web address]  7PM-7AM contact night coverage as above 02/25/2019, 2:44 PM  LOS: 0 days

## 2019-02-25 NOTE — Progress Notes (Signed)
Physical Therapy Treatment Patient Details Name: Kristen Colon MRN: [id number] DOB: 01/19/1951 Today's Date: 02/25/2019    History of Present Illness Pt is a 69 y.o. female with recent admission for COVID-19 PNA with d/c to [facility] (02/08/19), now readmitted 02/21/19 with fever and tachycardia. Worked up for urosepsis. Worsening pulmonary status 2/4; chest CTA negative for PE. 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   PT Comments    Pt able to tolerate seated and standing ADL tasks this session, intermittent assist to complete task and close min guard for balance with dynamic standing tasks. Pt limited by generalized weakness, decreased activity tolerance and fatigue; moving slowly and requiring frequent rest breaks.   SpO2 96-99% on 10L HFNC HR 88-110s  Supine BP 111/67 Sitting BP 117/78 Post-standing BP 104/65   Follow Up Recommendations  SNF;Supervision/Assistance - 24 hour     Equipment Recommendations  Rolling walker with 5" wheels    Recommendations for Other Services       Precautions / Restrictions Precautions Precautions: Fall;Other (comment) Precaution Comments: Bowel incontinence Restrictions Weight Bearing Restrictions: No    Mobility  Bed Mobility Overal bed mobility: Needs Assistance Bed Mobility: Supine to Sit;Sit to Supine     Supine to sit: Min assist;HOB elevated Sit to supine: Supervision   General bed mobility comments: MinA for HHA to elevate trunk  Transfers Overall transfer level: Needs assistance Equipment used: Rolling walker (2 wheeled) Transfers: Sit to/from Stand Sit to Stand: Min guard         General transfer comment: Min guard for balance, no physical assist required  Ambulation/Gait Ambulation/Gait assistance: Min guard Gait Distance (Feet): 2 Feet Assistive device: None Gait Pattern/deviations: Step-to pattern;Trunk flexed   Gait  velocity interpretation: <1.31 ft/sec, indicative of household ambulator General Gait Details: Slow steps forward and towards HOB without DME, close min guard for balance, pt with 2x instability reaching to furniture/reliant on UE support to prevent LOB; declining further distance or transfer to recliner   Stairs             Wheelchair Mobility    Modified Rankin (Stroke Patients Only)       Balance Overall balance assessment: Needs assistance Sitting-balance support: Feet supported;No upper extremity supported Sitting balance-Leahy Scale: Good       Standing balance-Leahy Scale: Fair Standing balance comment: Can static stand and perform pericare without UE support                            Cognition Arousal/Alertness: Awake/alert Behavior During Therapy: Flat affect   Area of Impairment: Orientation;Attention;Memory;Safety/judgement;Awareness;Problem solving;Following commands                 Orientation Level: Disoriented to;Place;Time;Situation Current Attention Level: Sustained Memory: Decreased short-term memory Following Commands: Follows one step commands with increased time;Follows one step commands consistently Safety/Judgement: Decreased awareness of safety;Decreased awareness of deficits Awareness: Intellectual Problem Solving: Slow processing;Decreased initiation;Requires verbal cues General Comments: Initially lethargic, becoming more alert and interactive once sitting EOB. Only oriented to self. Following simple commands with increased time; poor short-term memory and intermittent confusion. Asking if I had slept in the recliner overnight      Exercises      General Comments General comments (skin integrity, edema, etc.): SpO2 96-99% on 10L HFNC      Pertinent Vitals/Pain Pain Assessment: No/denies pain  Home Living                      Prior Function            PT Goals (current goals can now be found in the  care plan section) Acute Rehab PT Goals Patient Stated Goal: "I need to pee" PT Goal Formulation: With patient Time For Goal Achievement: 03/08/19 Potential to Achieve Goals: Fair Progress towards PT goals: Not progressing toward goals - comment(limited by fatigue)    Frequency    Min 2X/week      PT Plan Current plan remains appropriate    Co-evaluation              AM-PAC PT "6 Clicks" Mobility   Outcome Measure  Help needed turning from your back to your side while in a flat bed without using bedrails?: None Help needed moving from lying on your back to sitting on the side of a flat bed without using bedrails?: A Little Help needed moving to and from a bed to a chair (including a wheelchair)?: A Little Help needed standing up from a chair using your arms (e.g., wheelchair or bedside chair)?: A Little Help needed to walk in hospital room?: A Little Help needed climbing 3-5 steps with a railing? : A Little 6 Click Score: 19    End of Session Equipment Utilized During Treatment: Oxygen Activity Tolerance: Patient limited by fatigue Patient left: in bed;with bed alarm set;with call bell/phone within reach Nurse Communication: Mobility status PT Visit Diagnosis: Unsteadiness on feet (R26.81);Muscle weakness (generalized) (M62.81)     Time: [id number] PT Time Calculation (min) (ACUTE ONLY): 27 min  Charges:  $Therapeutic Activity: 23-37 mins                     Mabeline Caras, PT, DPT Acute Rehabilitation Services  Pager [phone] Office [place] 02/25/2019, 9:30 AM

## 2019-02-25 NOTE — Progress Notes (Signed)
Inpatient Diabetes Program Recommendations  AACE/ADA: New Consensus Statement on Inpatient Glycemic Control (2015)  Target Ranges:  Prepandial:   less than 140 mg/dL      Peak postprandial:   less than 180 mg/dL (1-2 hours)      Critically ill patients:  140 - 180 mg/dL   Lab Results  Component Value Date   GLUCAP 324 (H) 02/25/2019   HGBA1C 8.5 (H) 02/21/2019    Review of Glycemic Control Results for Kristen Colon, Kristen Colon (MRN [id number]) as of 02/25/2019 11:24  Ref. Range 02/24/2019 12:22 02/24/2019 16:41 02/24/2019 21:20 02/25/2019 07:20  Glucose-Capillary Latest Ref Range: 70 - 99 mg/dL 187 (H) 160 (H) 228 (H) 324 (H)   Diabetes history: Type 2 DM Outpatient Diabetes medications: Metformin 500 mg QAM, Humalog 2-10 units TID Current orders for Inpatient glycemic control: Novolog 0-15 units TID, Novolog 0-5 units QHS Solumedrol 60 mg BID  Inpatient Diabetes Program Recommendations:    In the setting of steroids, consider adding Levemir 8 units QD and Novolog 3 units TID (assuming patient consuming >50% of meal).  Thanks, Bronson Curb, MSN, RNC-OB Diabetes Coordinator [phone] (8a-5p)

## 2019-02-25 NOTE — Evaluation (Signed)
Clinical/Bedside Swallow Evaluation Patient Details  Name: Kristen Colon MRN: [id number] Date of Birth: 07-29-1950  Today's Date: 02/25/2019 Time: SLP Start Time (ACUTE ONLY): 0900 SLP Stop Time (ACUTE ONLY): 0930 SLP Time Calculation (min) (ACUTE ONLY): 30 min  Past Medical History:  Past Medical History:  Diagnosis Date  . Anxiety   . Dementia ([place])   . Dyspnea   . Headache   . Hypertension   . Schizophrenia ([place])    paranoid   Past Surgical History:  Past Surgical History:  Procedure Laterality Date  . DIAGNOSTIC LAPAROSCOPY     pelvic mass Dr. Denman George 06-30-17  . LAPAROSCOPY N/A 06/30/2017   Procedure: LAPAROSCOPY DIAGNOSTIC WITH BIOPSIES;  Surgeon: Everitt Amber, MD;  Location: [facility];  Service: Gynecology;  Laterality: N/A;   HPI:  69yo female admitted 02/21/19 from SNF with tachycardia and fever. PMH: HTN, DM2, schizophrenia, dementia, anxiety, recent Covid. CXR = Chronic interstitial lung disease with increased airspace opacitiesin the right upper lobe and throughout the left lung worrisome forsuperimposed pneumonia.   Assessment / Plan / Recommendation Clinical Impression  Pt seen at bedside for assessment of least restrictive diet. Pt was sleeping upon arrival of SLP, but aroused easily with min verbal and tactile stim. Upper and lower dentures were removed by patient and cleaned by SLP. Pt replaced dentures for po trials. Pt exhibits adequate oral motor strength and function. Pt accepted trials of thin liquid and puree textures, and exhbited no difficulty. Dentures were noted to be ill-fitting when chewing the cracker, so pt removed dentures to decrease aspiration risk. Extended oral prep noted of graham cracker following removal of dentures. Recommend dys 2 diet with thin liquids, meds whole in puree. Safe swallow precautions posted at [facility]. SLP will follow for education and assessment of diet tolerance. RN and MD informed of results and recommendations.    SLP Visit Diagnosis:  Dysphagia, unspecified (R13.10)    Aspiration Risk  Mild aspiration risk;Risk for inadequate nutrition/hydration    Diet Recommendation Dysphagia 2 (Fine chop);Thin liquid   Liquid Administration via: Cup;Straw Medication Administration: Whole meds with puree Supervision: Patient able to self feed;Intermittent supervision to cue for compensatory strategies Compensations: Slow rate;Small sips/bites;Minimize environmental distractions Postural Changes: Seated upright at 90 degrees;Remain upright for at least 30 minutes after po intake    Other  Recommendations Oral Care Recommendations: Oral care BID   Follow up Recommendations 24 hour supervision/assistance      Frequency and Duration min 1 x/week  1 week;2 weeks       Prognosis Prognosis for Safe Diet Advancement: Fair Barriers to Reach Goals: Cognitive deficits(ill fitting dentures)      Swallow Study   General Date of Onset: 02/21/19 HPI: 69yo female admitted 02/21/19 from SNF with tachycardia and fever. PMH: HTN, DM2, schizophrenia, dementia, anxiety, recent Covid. CXR = Chronic interstitial lung disease with increased airspace opacitiesin the right upper lobe and throughout the left lung worrisome forsuperimposed pneumonia. Type of Study: Bedside Swallow Evaluation Previous Swallow Assessment: none Diet Prior to this Study: NPO Temperature Spikes Noted: No Respiratory Status: Nasal cannula History of Recent Intubation: No Behavior/Cognition: Alert;Cooperative;Pleasant mood Oral Cavity Assessment: Within Functional Limits Oral Care Completed by SLP: Yes Oral Cavity - Dentition: Dentures, top;Dentures, bottom Vision: Functional for self-feeding Self-Feeding Abilities: Able to feed self Patient Positioning: Upright in bed Baseline Vocal Quality: Normal Volitional Cough: Weak Volitional Swallow: Able to elicit    Oral/Motor/Sensory Function Overall Oral Motor/Sensory Function: Within functional limits   Ice Chips  Ice  chips: Not tested   Thin Liquid Thin Liquid: Within functional limits Presentation: Straw    Nectar Thick Nectar Thick Liquid: Not tested   Honey Thick Honey Thick Liquid: Not tested   Puree Puree: Within functional limits Presentation: Spoon;Self Fed   Solid     Solid: Within functional limits Presentation: [place] [facility] [place], [facility], [place] Speech Language Pathologist Office: [phone] Pager: [phone]   Shonna Chock 02/25/2019,9:45 AM

## 2019-02-26 DIAGNOSIS — N390 Urinary tract infection, site not specified: Secondary | ICD-10-CM | POA: Diagnosis not present

## 2019-02-26 DIAGNOSIS — F0391 Unspecified dementia with behavioral disturbance: Secondary | ICD-10-CM | POA: Diagnosis not present

## 2019-02-26 DIAGNOSIS — Z7189 Other specified counseling: Secondary | ICD-10-CM

## 2019-02-26 DIAGNOSIS — A419 Sepsis, unspecified organism: Secondary | ICD-10-CM | POA: Diagnosis not present

## 2019-02-26 DIAGNOSIS — G893 Neoplasm related pain (acute) (chronic): Secondary | ICD-10-CM | POA: Diagnosis not present

## 2019-02-26 DIAGNOSIS — I959 Hypotension, unspecified: Secondary | ICD-10-CM | POA: Diagnosis not present

## 2019-02-26 DIAGNOSIS — R509 Fever, unspecified: Secondary | ICD-10-CM | POA: Diagnosis not present

## 2019-02-26 DIAGNOSIS — Z515 Encounter for palliative care: Secondary | ICD-10-CM | POA: Diagnosis not present

## 2019-02-26 LAB — CBC WITH DIFFERENTIAL/PLATELET
Abs Immature Granulocytes: 0.1 10*3/uL — ABNORMAL HIGH (ref 0.00–0.07)
Basophils Absolute: 0 10*3/uL (ref 0.0–0.1)
Basophils Relative: 0 %
Eosinophils Absolute: 0 10*3/uL (ref 0.0–0.5)
Eosinophils Relative: 0 %
HCT: 31.8 % — ABNORMAL LOW (ref 36.0–46.0)
Hemoglobin: 10.5 g/dL — ABNORMAL LOW (ref 12.0–15.0)
Immature Granulocytes: 1 %
Lymphocytes Relative: 11 %
Lymphs Abs: 1.4 10*3/uL (ref 0.7–4.0)
MCH: 28.2 pg (ref 26.0–34.0)
MCHC: 33 g/dL (ref 30.0–36.0)
MCV: 85.3 fL (ref 80.0–100.0)
Monocytes Absolute: 0.5 10*3/uL (ref 0.1–1.0)
Monocytes Relative: 4 %
Neutro Abs: 11.1 10*3/uL — ABNORMAL HIGH (ref 1.7–7.7)
Neutrophils Relative %: 84 %
Platelets: 280 10*3/uL (ref 150–400)
RBC: 3.73 MIL/uL — ABNORMAL LOW (ref 3.87–5.11)
RDW: 17 % — ABNORMAL HIGH (ref 11.5–15.5)
WBC: 13.1 10*3/uL — ABNORMAL HIGH (ref 4.0–10.5)
nRBC: 0 % (ref 0.0–0.2)

## 2019-02-26 LAB — CULTURE, BLOOD (ROUTINE X 2)
Culture: NO GROWTH
Culture: NO GROWTH
Special Requests: ADEQUATE

## 2019-02-26 LAB — BASIC METABOLIC PANEL
Anion gap: 13 (ref 5–15)
BUN: 11 mg/dL (ref 8–23)
CO2: 26 mmol/L (ref 22–32)
Calcium: 10.3 mg/dL (ref 8.9–10.3)
Chloride: 101 mmol/L (ref 98–111)
Creatinine, Ser: 0.6 mg/dL (ref 0.44–1.00)
GFR calc Af Amer: >60 mL/min (ref >60)
GFR calc non Af Amer: >60 mL/min (ref >60)
Glucose, Bld: 324 mg/dL — ABNORMAL HIGH (ref 70–99)
Potassium: 4.1 mmol/L (ref 3.5–5.1)
Sodium: 140 mmol/L (ref 135–145)

## 2019-02-26 LAB — GLUCOSE, CAPILLARY
Glucose-Capillary: 398 mg/dL — ABNORMAL HIGH (ref 70–99)
Glucose-Capillary: 368 mg/dL — ABNORMAL HIGH (ref 70–99)
Glucose-Capillary: 336 mg/dL — ABNORMAL HIGH (ref 70–99)
Glucose-Capillary: 120 mg/dL — ABNORMAL HIGH (ref 70–99)

## 2019-02-26 LAB — PROCALCITONIN: Procalcitonin: 1.55 ng/mL

## 2019-02-26 MED ORDER — ACETAMINOPHEN 325 MG PO TABS
650.0000 mg | ORAL_TABLET | Freq: Three times a day (TID) | ORAL | Status: DC
  Administered 2019-02-26 – 2019-03-03 (×16): 650 mg via ORAL
  Filled 2019-02-26 (×16): qty 2

## 2019-02-26 MED ORDER — INSULIN GLARGINE 100 UNIT/ML ~~LOC~~ SOLN
30.0000 [IU] | Freq: Every day | SUBCUTANEOUS | Status: DC
  Administered 2019-02-26 – 2019-02-27 (×2): 30 [IU] via SUBCUTANEOUS
  Filled 2019-02-26 (×2): qty 0.3

## 2019-02-26 MED ORDER — SODIUM CHLORIDE 0.9 % IV SOLN
2.0000 g | Freq: Two times a day (BID) | INTRAVENOUS | Status: DC
  Administered 2019-02-26 – 2019-03-01 (×7): 2 g via INTRAVENOUS
  Filled 2019-02-26 (×8): qty 2

## 2019-02-26 MED ORDER — VANCOMYCIN HCL IN DEXTROSE 1-5 GM/200ML-% IV SOLN
1000.0000 mg | INTRAVENOUS | Status: DC
  Administered 2019-02-26 – 2019-02-28 (×3): 1000 mg via INTRAVENOUS
  Filled 2019-02-26 (×4): qty 200

## 2019-02-26 MED ORDER — OXYCODONE HCL 5 MG PO TABS
2.5000 mg | ORAL_TABLET | Freq: Four times a day (QID) | ORAL | Status: DC | PRN
  Filled 2019-02-26: qty 1
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
## 2019-02-26 NOTE — Progress Notes (Signed)
Pharmacy Antibiotic Note  Kristen Colon is a 69 y.o. female admitted on 02/21/2019 with pneumonia.  Pharmacy has been consulted for vancomycin and zosyn dosing.  Currently on D6 of antibiotics. WBC increasing to 13 (on steroids). PCT remains elevated at 1.55 despite antibiotics. Temp 36 today. Scr 0.6 (CrCl 52 mL/min) - similar to baseline. No growth on cx to date, MRSA PCR neg on 2/1. CTX and azithromycin escalated to cefepime/vanc (PCN allergy listed, has tolerated CTX, no records of PCN received in past).  Plan: Vancomycin 1g IV every 24 hours (estAUC 445)  Cefepime 2g IV every 12 hours  Monitor cx results, clinical pic, opportunities to de-escalate therapy, and vanc levels as indicated  Height: 5\' 5"  (165.1 cm) Weight: 110 lb 3.7 oz (50 kg) IBW/kg (Calculated) : 57  Temp (24hrs), Avg:97.7 F (36.5 C), Min:96.8 F (36 C), Max:98 F (36.7 C)  Recent Labs  Lab 02/22/19 0156 02/22/19 0432 02/22/19 0639 02/22/19 1001 02/22/19 1455 02/23/19 0216 02/24/19 0907 02/24/19 1831 02/24/19 2017 02/25/19 0212 02/26/19 0232  WBC 10.3  --   --   --   --  8.8 9.5  --   --  11.2* 13.1*  CREATININE 0.61   < > 0.64  --   --  0.49 0.78  --   --  0.62 0.60  LATICACIDVEN  --    < >  --  1.9 1.7  --  1.2 3.0* 1.6  --   --    < > = values in this interval not displayed.    Estimated Creatinine Clearance: 52.4 mL/min (by C-G formula based on SCr of 0.6 mg/dL).    Allergies  Allergen Reactions  . Penicillins Other (See Comments)    Edema Did it involve swelling of the face/tongue/throat, SOB, or low BP? Unknown Did it involve sudden or severe rash/hives, skin peeling, or any reaction on the inside of your mouth or nose? Unknown Did you need to seek medical attention at a hospital or doctor's office? Unknown When did it last happen?unknown If all above answers are "NO", may proceed with cephalosporin use. Daughter was unaware of allergy  . 5-Alpha Reductase Inhibitors   . Percocet  [Oxycodone-Acetaminophen] Itching    Antimicrobials this admission: Vancomycin 2/1 x1, 2/6>> Cefepime 2/1 x1, 2/6 >>  Ceftriaxone 2/2>>2/6 Azithromycin 2/5 x1  Dose adjustments this admission: N/A  Microbiology results: 2/1 BCx: NGTD 2/1 MRSA PCR: neg 2/2 UCx: <10k insign growth 2/4 BCx: NGTD    Thank you for allowing pharmacy to be a part of this patient's care.  Antonietta Jewel, PharmD, BCCCP Clinical Pharmacist  Phone: [phone]  Please check AMION for all [place] phone numbers After 10:00 PM, call [place] [phone] 02/26/2019 9:49 AM

## 2019-02-26 NOTE — Progress Notes (Signed)
Upon entering room noted that opt bed was elevated in the air with no rails up and no one at bedside. Pt daughter stated that she did that because she put patient on bedpan. Daughter reminded that for pt safety that patient needed bed low or person at side when bed raised in air. Pt daughter stated that patient was in pain, upon asking patient, pt stated I'm not having any pain. Pt daughter stated for staff to give pain pill because mother was not cognitive to make that decision. Informed pt daughter that nursing staff assess patient along with pt verbalizations and wishes, however if daughter does not feel that opatient is cable of making that decision than may need to discuss with medical team other interventions for pain management such as scheduled pain meds or the possibility of iv infusions. Daughter stated that would be good. Daughter stated to mom, tell the nurse your having pain. Which pt then responded yes I'm having a little pain and pointed to upper abdominal region. This nurse discussed options of prn pain management vs scheduled. Pt daughter states this nurse could give them all. Pt daughter made aware that for pt safety we can give prn medication now and come back in about a hour to give scheduled meds so that pt does not have any complications from too much medication at one time, especially d/t this nurse being unfamiliar with pt. Daughter states that she normally gives mother her Vicodin and valium together at bedtime and that's why pt hasn't gotten any sleep here. Daughter informed we could discuss options with medical team for patient. Daughter informed r/t pt gbs in which daughter stated she was giving pt prune juice for constipation and she did not know it was high in sugar. Pt daughter was informed of possible other options that may not raise patient blood sugar but we would need to talk to medical. Patient daughter was asked the last time pt had a bm. Pt daughter stated now and yesterday.  Communication sent to MD to make aware.

## 2019-02-26 NOTE — Progress Notes (Signed)
   02/26/19 1300  Clinical Encounter Type  Visited With Patient and family together  Visit Type Follow-up;Spiritual support  Referral From Nurse  Consult/Referral To Chaplain  Spiritual Encounters  Spiritual Needs Prayer;Emotional  Stress Factors  Patient Stress Factors Health changes  Family Stress Factors Major life changes   Chaplain responded to consult for family support. Kristen Colon was sleeping for the start of Chaplain's visit with Daughter Kristen Colon. Kristen Colon expressed distrust of the doctor who had been in to see Kristen Colon today. Kristen Colon mentioned that the MD treated Kristen Colon in a "lowly manner." Chaplain heard in Kristen Colon's comments a desire to be respected as a person and a professional. 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 She has been her mother's caregiver for several years, and from [place], "the medical team at [facility] have been with her mom 4 days, they do not know her mother the same way Kristen Colon knows Kristen Colon. Kristen Colon desires for the medical team to understand that she wants what is best for her mother and nothing less. Kristen Colon also expressed a need to be treated respectfully and not spoken down to. 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 Kristen Colon is asking that in our times of stress we remain kind and respectful towards one another. Chaplain practiced active listening allowing Kristen Colon to say what she needed to get out in order to process the changes in her mother's health. Chaplain encouraged both Kristen Colon and Kristen Colon to take in nourishment and rest as they spend time together.   Kristen Colon would like Health Care AD paperwork as well as Mental Health AD paperwork to be completed for her mother Kristen Colon on Monday February 28, 2019.  Chaplain remains available for support as needs arise. Chaplain Resident, Evelene Croon, M Div. [phone] on-call pager

## 2019-02-26 NOTE — Progress Notes (Signed)
Manufacturing engineer [facility])  Kristen Colon is no longer under hospice services.  She revoked her hospice benefit to pursue rehab at [facility] in January.  Please let us know if we can help in any way.   Venia Carbon RN, BSN, [facility] Liaison (are listed in [place]
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
## 2019-02-27 DIAGNOSIS — A419 Sepsis, unspecified organism: Secondary | ICD-10-CM | POA: Diagnosis not present

## 2019-02-27 DIAGNOSIS — G893 Neoplasm related pain (acute) (chronic): Secondary | ICD-10-CM | POA: Diagnosis not present

## 2019-02-27 DIAGNOSIS — Z515 Encounter for palliative care: Secondary | ICD-10-CM | POA: Diagnosis not present

## 2019-02-27 DIAGNOSIS — F0391 Unspecified dementia with behavioral disturbance: Secondary | ICD-10-CM | POA: Diagnosis not present

## 2019-02-27 DIAGNOSIS — Z7189 Other specified counseling: Secondary | ICD-10-CM | POA: Diagnosis not present

## 2019-02-27 DIAGNOSIS — N390 Urinary tract infection, site not specified: Secondary | ICD-10-CM | POA: Diagnosis not present

## 2019-02-27 DIAGNOSIS — R509 Fever, unspecified: Secondary | ICD-10-CM | POA: Diagnosis not present

## 2019-02-27 DIAGNOSIS — I959 Hypotension, unspecified: Secondary | ICD-10-CM | POA: Diagnosis not present

## 2019-02-27 LAB — CULTURE, BLOOD (ROUTINE X 2)
Culture: NO GROWTH
Culture: NO GROWTH
Special Requests: ADEQUATE

## 2019-02-27 LAB — GLUCOSE, CAPILLARY
Glucose-Capillary: 236 mg/dL — ABNORMAL HIGH (ref 70–99)
Glucose-Capillary: 402 mg/dL — ABNORMAL HIGH (ref 70–99)
Glucose-Capillary: 316 mg/dL — ABNORMAL HIGH (ref 70–99)
Glucose-Capillary: 356 mg/dL — ABNORMAL HIGH (ref 70–99)

## 2019-02-27 MED ORDER — BISACODYL 10 MG RE SUPP
10.0000 mg | Freq: Once | RECTAL | Status: AC
  Administered 2019-02-27: 10 mg via RECTAL
  Filled 2019-02-27: qty 1

## 2019-02-27 MED ORDER — INSULIN GLARGINE 100 UNIT/ML ~~LOC~~ SOLN
35.0000 [IU] | Freq: Every day | SUBCUTANEOUS | Status: DC
  Administered 2019-02-28: 35 [IU] via SUBCUTANEOUS
  Filled 2019-02-27 (×2): qty 0.35

## 2019-02-27 NOTE — Progress Notes (Signed)
[facility])  Referral received for hospice services at home.  Pt was under hospice services recently and revoked to pursue rehab at [facility].  Attempted to call dtr [name] x 3, [name] advised she would call this Probation officer later.  [facility] will attempt to reach again on Monday to determine if any DME is needed.   Venia Carbon RN, BSN, [place] Liaison  [phone]

## 2019-02-27 NOTE — Progress Notes (Addendum)
CSW noticed that the [facility] from [place] had put in a note stating that the patient was no longer receiving hospice services. When the patient went to [facility] back in January, that revoked her hospice benefits.   CSW called the patient's daughter, [name] and informed her of this. Patient's daughter is interested in receiving hospice services again for her mother. Once the patient is stable enough to leave the hospital, the daughter wants to bring her home under hospice care.   CSW called Anderson Malta with [facility]. CSW made the referral for the patient to start receiving services again. Anderson Malta will be reaching out to the patient's daughter, [name] at a later time.   CSW will continue to follow and assist with disposition planning.   Domenic Schwab, MSW, [place]

## 2019-02-27 NOTE — Progress Notes (Signed)
Palliative Medicine Inpatient Follow Up Note   HPI: Per Hospitalist Note --> The patient is a 69 yr old woman who is a resident of a SNF who was sent to the ED for evaluation of fever and tachycardia. She had been diagnosed with COVID-19 on 01/27/2019 and discharged back to the [facility] 02/08/2019. She carries a past medical history significant for hypertension, DM II, schizophrenia, dementia, and anxiety.  2/5 --> This morning the patient has had greatly increased oxygen requirements, fevers, and hypotension. She has been placed on a sepsis protocol, CTA chestwas performed and was consistent with volume overload. However, pt had greatly increased oxygen requirements. Pulmonology was consulted. They also recommended lasix, trial of steroids, as well as chest physiotherapy. Rocephin has been continued.Azithromycin has been added.  Made DNR. It appears that patients daughter, Kristen Colon   Per chart review patient was a patient under [facility] for a diagnosis of terminal ovarian cancer per Dr. Lyman Speller of [place] [facility]. It appears that she was enrolled in December 2020. Through note review it appears that Kristen Colon has truly struggles with her mothers diagnosis. It appears that she does not fully grasp hospice concepts. Presently her mother has refuted treatments inclusive of medications. This appears to be an ear of concern for patients daughter.   Palliative care consulted to better address these concerns and review the concepts of hospice.   Met with patient and her daughter on 2/6, they are hopeful for discharge soon.  Today's Discussion (02/27/2019): Chart reviewed. Touched base with bedside RN, Steffanie Dunn. Patient has been weaned down on oxygen to 2LPM overnight. Upon AM assessment was able to turn off for a trial period. Patient herself was in good spirits said she was feeling better. Eating quite well, received a second plate of pancakes this morning. Patients daughter, Kristen Colon at bedside.  Kristen Colon said that she was very pleased with her mothers improvements. We discussed goals for the day which will include getting the patient out of bed and getting her to have a bowel movement. Kristen Colon is still hopeful to get her mother home with re-enrollment in [facility] this week.   Discussed with patient the importance of continued conversation with family and their  medical providers regarding overall plan of care and treatment options, ensuring decisions are within the context of the patients values and GOCs.  Questions and concerns addressed   Vital Signs Vitals:   02/27/19 0300 02/27/19 0700  BP: (!) 146/77 (!) 123/110  Pulse:    Resp: 13 (!) 23  Temp: 98.2 F (36.8 C) 97.8 F (36.6 C)  SpO2: 97% 95%    Intake/Output Summary (Last 24 hours) at 02/27/2019 0908 Last data filed at 02/27/2019 0300 Gross per 24 hour  Intake --  Output 1800 ml  Net -1800 ml   Last Weight  Most recent update: 02/22/2019 12:08 AM   Weight  50 kg (110 lb 3.7 oz)            Physical Exam Vitals and nursing note reviewed.  HENT:     Head: Normocephalic.     Nose: Nose normal.     Mouth/Throat:     Mouth: Mucous membranes are moist.  Eyes:     Pupils: Pupils are equal, round, and reactive to light.  Cardiovascular:     Rate and Rhythm: Tachycardia present.     Pulses: Normal pulses.  Pulmonary:     Effort: Pulmonary effort is normal.   Abdominal:     Palpations: Abdomen is  soft.  Musculoskeletal:        General: Normal range of motion.     Cervical back: Normal range of motion.  Skin:    General: Skin is warm and dry.     Capillary Refill: Capillary refill takes less than 2 seconds.  Neurological:     Mental Status: She is alert  SUMMARY OF RECOMMENDATIONS   DNR/DNI  Goal to wean down O2 and get back home on [facility]  TOC --> [facility] re-consulted  Constipation - Bisacodyl suppository to be given  Activity - encouraged to get OOB with  assitance  Code Status/Advance Care Planning:  DNR  Symptom Management:  Generalized Pain in the setting of ovarian CA:                 - Tylenol 627m PO TID                 - Oxycodone 2.5-537mPO Q6H PRN Spiritual:                 - Chaplain consult to assist in advanced directives  Time Spent: 25 Greater than 50% of the time was spent in counseling and coordination of care ______________________________________________________________________________________ [facility] Team Cell Phone: [phone] utilize secure chat with additional questions, if there is no response within 30 minutes please call the above phone number  Palliative Medicine Team providers are available by phone from 7am to 7pm daily and can be reached through the team cell phone.  Should this patient require assistance outside of these hours, please call the patient's attending physician.

## 2019-02-27 NOTE — Progress Notes (Signed)
Pt does not currently wear BIPAP and does not have one in room at this time.

## 2019-02-27 NOTE — Progress Notes (Signed)
PROGRESS NOTE  Kristen Colon [id number] DOB: 1950/06/12 DOA: 02/21/2019 PCP: Everardo Beals, NP  Brief History   The patient is a 69 yr old woman who is a resident of a SNF who was sent to the ED for evaluation of fever and tachycardia. She had been diagnosed with COVID-19 on 01/27/2019 and discharged back to the [facility] on 02/08/2019. She carries a past medical history significant for hypertension, DM II, schizophrenia, left epithelial ovarian cancer, dementia, and anxiety.   In the ED the patient was found to be septic with HR in the 100's, Hypotension with systolic blood pressure in the 90's, She was requiring 2L O2 by nasal cannula. She was afebrile, although per the SNF, she had been febrile there. CXR demonstrated increasing upper lobe interstitial and perihilar ground glass opacities. Procalcitonin was negative. UA was positive for UTI. She has been started on Rocephin. Blood cultures x 2 and Urine cultures have been ordered.  The patient was initially admitted to 5W on telemetry. However, as she is now outside the 21 day window for COVID 19, she will be transferred to a telemetry bed. Today she remains tachycardic with HR 120-140. EKG is pending. Her Oxygen requirements have increased to 3L. She has been confused and agitated while on 5W.  This morning the patient has had greatly increased oxygen requirements, fevers, and hypotension. She has been placed on a sepsis protocol, CTA chest was performed and was consistent with volume overload. However, pt had greatly increased oxygen requirements. Pulmonology was consulted. They also recommended lasix, trial of steroids, as well as chest physiotherapy. Rocephin has been continued. Azithromycin has been added.  I have discussed the patient with her Daughter, Kristen Colon. Kristen Colon has decided to make her Mom a DNR.  The patient continued to refuse all interventions on 02/25/2019. Palliative care has been consulted. I appreciated her help. Consultants   . PCCM  Procedures  . None  Antibiotics   Anti-infectives (From admission, onward)   Start     Dose/Rate Route Frequency Ordered Stop   02/26/19 1100  vancomycin (VANCOCIN) IVPB 1000 mg/200 mL premix     1,000 mg 200 mL/hr over 60 Minutes Intravenous Every 24 hours 02/26/19 1010     02/26/19 1015  ceFEPIme (MAXIPIME) 2 g in sodium chloride 0.9 % 100 mL IVPB     2 g 200 mL/hr over 30 Minutes Intravenous Every 12 hours 02/26/19 1010     02/25/19 1530  azithromycin (ZITHROMAX) 500 mg in sodium chloride 0.9 % 250 mL IVPB  Status:  Discontinued     500 mg 250 mL/hr over 60 Minutes Intravenous Every 24 hours 02/25/19 1435 02/26/19 0936   02/22/19 1500  vancomycin (VANCOREADY) IVPB 750 mg/150 mL  Status:  Discontinued     750 mg 150 mL/hr over 60 Minutes Intravenous Every 24 hours 02/21/19 1452 02/21/19 1957   02/22/19 1500  cefTRIAXone (ROCEPHIN) 1 g in sodium chloride 0.9 % 100 mL IVPB  Status:  Discontinued     1 g 200 mL/hr over 30 Minutes Intravenous Every 24 hours 02/21/19 1957 02/26/19 0936   02/21/19 1445  aztreonam (AZACTAM) 2 g in sodium chloride 0.9 % 100 mL IVPB  Status:  Discontinued     2 g 200 mL/hr over 30 Minutes Intravenous  Once 02/21/19 1432 02/21/19 1439   02/21/19 1445  ceFEPIme (MAXIPIME) 2 g in sodium chloride 0.9 % 100 mL IVPB  Status:  Discontinued     2 g 200 mL/hr over 30  Minutes Intravenous Every 8 hours 02/21/19 1444 02/21/19 1957   02/21/19 1445  vancomycin (VANCOCIN) IVPB 1000 mg/200 mL premix     1,000 mg 200 mL/hr over 60 Minutes Intravenous  Once 02/21/19 1444 02/21/19 1710     Subjective  The patient is awake, alert, and in good spirits today. No new complaints.  Objective   Vitals:  Vitals:   02/27/19 0949 02/27/19 1156  BP: 126/70 140/67  Pulse: (!) 102   Resp:  20  Temp:  98 F (36.7 C)  SpO2:  92%   Exam:  Constitutional:  . The patient is resting comfortably in bed. She is Counsellor. No acute distress. Respiratory:   . No increased work of breathing. . No wheezes, rales, or rhonchi . No tactile fremitus Cardiovascular:  . Regular rate and rhythm . No murmurs, ectopy, or gallups. . No lateral PMI. No thrills. Abdomen:  . Abdomen is soft, non-tender, non-distended . No hernias, masses, or organomegaly . Normoactive bowel sounds.  Musculoskeletal:  . No cyanosis, clubbing, or edema Skin:  . No rashes, lesions, ulcers . palpation of skin: no induration or nodules Neurologic:  . CN 2-12 intact . She is moving all extremities Psychiatric: Pt is awake and alert.   I have personally reviewed the following:   Today's Data  . Orthoptist  . BC x 2 drawn on 2/1 and 2/2 and 2/4 have had no growth . Urine culture collected on 02/22/2019 has had insignificant growth.  Imaging  CXR (02/21/2019) : Increasing upper lobe predominant interstitial and perihilar ground-glass opacities. Findings could reflect interstitial edema or atypical viral pneumonia. CTA Chest: Bilateral upper and lower lobe opacities. No PE.   Cardiology Data  . EKG - sinus tachycardia  Scheduled Meds: . acetaminophen  650 mg Oral TID  . buPROPion  150 mg Oral Daily  . Chlorhexidine Gluconate Cloth  6 each Topical Daily  . cholecalciferol  2,000 Units Oral Daily  . cyclobenzaprine  5 mg Oral Daily  . enoxaparin (LOVENOX) injection  40 mg Subcutaneous Q24H  . fentaNYL  1 patch Transdermal Q48H  . insulin aspart  0-15 Units Subcutaneous TID WC  . insulin aspart  0-5 Units Subcutaneous QHS  . insulin glargine  30 Units Subcutaneous Daily  . lactulose  10 g Oral Daily  . letrozole  2.5 mg Oral Daily  . linaclotide  290 mcg Oral QAC breakfast  . lubiprostone  24 mcg Oral Q breakfast  . memantine  5 mg Oral BID  . methylPREDNISolone (SOLU-MEDROL) injection  60 mg Intravenous Q12H  . metoCLOPramide (REGLAN) injection  5 mg Intravenous TID AC & HS  . metoprolol tartrate  12.5 mg Oral BID  . multivitamin with minerals  1  tablet Oral Daily  . senna  1 tablet Oral BID  . temazepam  15 mg Oral QHS  . thiamine  100 mg Oral Daily   Continuous Infusions: . sodium chloride 1,000 mL (02/24/19 2000)  . ceFEPime (MAXIPIME) IV 2 g (02/27/19 0936)  . vancomycin 1,000 mg (02/27/19 1138)    Active Problems:   Left ovarian epithelial cancer ([place])   Fever   Sepsis ([place])   Severe comorbid illness   Palliative care by specialist   Goals of care, counseling/discussion   Chronic pain due to neoplasm   LOS: 5 days   A & P  Sepsis: Tachycardia, hypotension, fever, lactic acidosis, hypoxia on admission. Resolved.  Likely due to UTI, although BC x  2 is pending, and CXR demonstrated opacities consistent with either pulmonary edema or infiltrated. procalcitonin is negative. Pt is receiving empiric Rocephin pending cultures. She is now outside the 21 day window for COVID-19. However, this morning the patient again is appearing septic with fever, hypotension, tachycardia. Sepsis protocol was initiated. Although her blood pressure initially responded to bolus, the patient's oxygen requirements have increased. This has not improved with attempts at diuresis.  Acute hypoxic respiratory failure: Resolved. The patient is currently saturating at 92% on room air. CXR from 02/21/2019 demonstrated bibasilar opacities consistent with eith3er pulmonary edema or infiltrates. She is receiving empiric rocephin and azithromycin. CTA chest has ruled out pulmonary embolus. PCCM was also consulted. They recommended continuation of antibiotics, initiation of steroids and a trial of diuresis.  She is currently saturating in the mid 90's on 6L HFNC O2 which is an improvement. Antibiotics have been changed to cefepime and vancomycin for possible HCAP. She is getting IV steroids.   Tachycardia: Resolved.   DM II: Elevated glucoses due to steroids which have been restarted. FSBS for the last 24 hours have run between 120 - 402. She is receiving correction  insulin as well as Lantus 30 units daily. Continue to monitor. Hold metformin while inpatient.   Hypotension: Resolved.   Chronic constipation: Miralax bid. Monitor. Last documented BM 02/25/2019.   Dementia: Noted. The patient is awake and alert today.   I have seen and examined this patient myself. I have spent 35 minutes in her evaluation and care.  DVT prophylaxis: Lovenox CODE STATUS: DNR Family Communication: The patient's daughter, Kristen Colon, was at bedside.  Disposition: The patient is from SNF. However, palliative care has noted that the daughter is interested in taking the patient home at discharge. I will ask PT/OT to evaluate the patient and make recommendations for disposition.   Kristen Kienast, DO [facility] Direct contact: see [email or web address]  7PM-7AM contact night coverage as above 02/27/2019, 12:59 PM  LOS: 0 days

## 2019-02-28 DIAGNOSIS — A419 Sepsis, unspecified organism: Secondary | ICD-10-CM | POA: Diagnosis not present

## 2019-02-28 DIAGNOSIS — Z515 Encounter for palliative care: Secondary | ICD-10-CM

## 2019-02-28 DIAGNOSIS — Z7189 Other specified counseling: Secondary | ICD-10-CM | POA: Diagnosis not present

## 2019-02-28 DIAGNOSIS — G893 Neoplasm related pain (acute) (chronic): Secondary | ICD-10-CM | POA: Diagnosis not present

## 2019-02-28 DIAGNOSIS — R509 Fever, unspecified: Secondary | ICD-10-CM | POA: Diagnosis not present

## 2019-02-28 LAB — BASIC METABOLIC PANEL
Anion gap: 13 (ref 5–15)
BUN: 20 mg/dL (ref 8–23)
CO2: 25 mmol/L (ref 22–32)
Calcium: 9.2 mg/dL (ref 8.9–10.3)
Chloride: 97 mmol/L — ABNORMAL LOW (ref 98–111)
Creatinine, Ser: 0.66 mg/dL (ref 0.44–1.00)
GFR calc Af Amer: >60 mL/min (ref >60)
GFR calc non Af Amer: >60 mL/min (ref >60)
Glucose, Bld: 275 mg/dL — ABNORMAL HIGH (ref 70–99)
Potassium: 3.9 mmol/L (ref 3.5–5.1)
Sodium: 135 mmol/L (ref 135–145)

## 2019-02-28 LAB — GLUCOSE, CAPILLARY
Glucose-Capillary: 115 mg/dL — ABNORMAL HIGH (ref 70–99)
Glucose-Capillary: 360 mg/dL — ABNORMAL HIGH (ref 70–99)
Glucose-Capillary: 287 mg/dL — ABNORMAL HIGH (ref 70–99)
Glucose-Capillary: 210 mg/dL — ABNORMAL HIGH (ref 70–99)

## 2019-02-28 NOTE — Progress Notes (Signed)
[facility]  Received request from [facility] manager for family interest in hospice services at home after discharge. Chart reviewed and eligibility confirmed by hospice physician.   Spoke with daughter by phone to confirm plan to return to her home. Per daughter, DME needs and discharge date are not yet determined. Will continue to follow and order when closer to discharge.  Please send home with patient signed and completed DNR.   Please send home with patient scripts for any medication she does not already have.   [facility] referral specialist will contact daughter to arrange first visit at home after discharge.   Thank you, Erling Conte, LCSW [phone] Hilma Favors are listed daily on AMION under Hospice and [place]

## 2019-02-28 NOTE — Progress Notes (Addendum)
  Speech Language Pathology Treatment: Dysphagia  Patient Details Name: Kristen Colon MRN: [id number] DOB: [birth year] Today's Date: 02/28/2019 Time: [id number] SLP Time Calculation (min) (ACUTE ONLY): 35 min  Assessment / Plan / Recommendation Clinical Impression  Pt received sitting upright in chair, 2L oxygen via nasal cannula. RR in the mid-teens,  oxygen saturation 93%-96% per tele. Daughter present. Upon entry, patient with upper and lower dentures in oral cavity. Both are very ill-fitting/loose which poses a choking risk. ST removed and dentures left in denture cup at bedside. Pt seen with thin liquids via straw and cup sips, no overt s/sx aspiration seen, no significant change in respirations per tele. Patient tolerating puree solids well, no overt s/sx aspiration. Pt seen with small bite of vegetable soup with carrot: significantly prolonged mastication, but patient able to swallow, no overt  S/sx aspiration. Pt seen with diced peaches, small bite grilled cheese sandwich (all presented to patient without dentures). Pt with prolonged mastication and difficulty with bolus formation seen with peaches and grilled cheese sandwich. After a period of mastication, patient expelling all trials of peaches and grilled cheese sandwich.  ST placed upper denture plate (which fit better compared to lowers) and provided trial of small bite of chopped peaches. Pt w/ prolonged mastication, able to swallow peaches, but upper plate becoming loose. Pt and daughter report food will sometimes become lodged between roof of mouth and upper plate, posing a risk.   D/w pt and daughter, recommend patient not consume PO with dentures in place. Daughter verbalized understanding, states she will attempt to make dentistry appt to have dentures re-fitted. D/w pt and daughter re: diet textures, dys1 vs dys2, pt and family verbalized desire to try puree solids to increase patient's PO intake as she has much difficulty w/  mastication.  Recommend: dysphagia 1/thin liquids. ST to follow briefly for diet tolerance.  D/w RN re: diet recommendations, she verbalized understanding.    HPI HPI: 69yo female admitted 02/21/19 from SNF with tachycardia and fever. PMH: HTN, DM2, schizophrenia, dementia, anxiety, recent Covid. CXR = Chronic interstitial lung disease with increased airspace opacitiesin the right upper lobe and throughout the left lung worrisome forsuperimposed pneumonia.      SLP Plan  Continue with current plan of care       Recommendations  Diet recommendations: Dysphagia 1 (puree);Thin liquid Medication Administration: Whole meds with liquid Supervision: Full supervision/cueing for compensatory strategies Compensations: Slow rate;Small sips/bites;Minimize environmental distractions Postural Changes and/or Swallow Maneuvers: Seated upright 90 degrees                Oral Care Recommendations: Oral care BID Follow up Recommendations: 24 hour supervision/assistance SLP Visit Diagnosis: Dysphagia, unspecified (R13.10) Plan: Continue with current plan of care       [place], M.Ed., [facility] Acute Rehabilitation [phone]: Acute Rehab office [phone] - pager    Bunnie Rehberg 02/28/2019, 1:17 PM

## 2019-02-28 NOTE — Progress Notes (Signed)
PROGRESS NOTE  Kristen Colon [id number] DOB: [birth year] DOA: 02/21/2019 PCP: Everardo Beals, NP  Brief History   The patient is a 69 yr old woman who is a resident of a SNF who was sent to the ED for evaluation of fever and tachycardia. She had been diagnosed with COVID-19 on 01/27/2019 and discharged back to the [facility] on 02/08/2019. She carries a past medical history significant for hypertension, DM II, schizophrenia, left epithelial ovarian cancer, dementia, and anxiety.   In the ED the patient was found to be septic with HR in the 100's, Hypotension with systolic blood pressure in the 90's, She was requiring 2L O2 by nasal cannula. She was afebrile, although per the SNF, she had been febrile there. CXR demonstrated increasing upper lobe interstitial and perihilar ground glass opacities. Procalcitonin was negative. UA was positive for UTI. She has been started on Rocephin. Blood cultures x 2 and Urine cultures have been ordered.  The patient was initially admitted to 5W on telemetry. However, as she is now outside the 21 day window for COVID 19, she will be transferred to a telemetry bed. Today she remains tachycardic with HR 120-140. EKG is pending. Her Oxygen requirements have increased to 3L. She has been confused and agitated while on 5W.  This morning the patient has had greatly increased oxygen requirements, fevers, and hypotension. She has been placed on a sepsis protocol, CTA chest was performed and was consistent with volume overload. However, pt had greatly increased oxygen requirements. Pulmonology was consulted. They also recommended lasix, trial of steroids, as well as chest physiotherapy. Rocephin has been continued. Azithromycin has been added.  I have discussed the patient with her Daughter, [name]. [name] has decided to make her Mom a DNR.  The patient continued to refuse all interventions on 02/25/2019. Palliative care has been consulted. I appreciated her help. Consultants   . PCCM  Procedures  . None  Antibiotics   Anti-infectives (From admission, onward)   Start     Dose/Rate Route Frequency Ordered Stop   02/26/19 1100  vancomycin (VANCOCIN) IVPB 1000 mg/200 mL premix     1,000 mg 200 mL/hr over 60 Minutes Intravenous Every 24 hours 02/26/19 1010     02/26/19 1015  ceFEPIme (MAXIPIME) 2 g in sodium chloride 0.9 % 100 mL IVPB     2 g 200 mL/hr over 30 Minutes Intravenous Every 12 hours 02/26/19 1010     02/25/19 1530  azithromycin (ZITHROMAX) 500 mg in sodium chloride 0.9 % 250 mL IVPB  Status:  Discontinued     500 mg 250 mL/hr over 60 Minutes Intravenous Every 24 hours 02/25/19 1435 02/26/19 0936   02/22/19 1500  vancomycin (VANCOREADY) IVPB 750 mg/150 mL  Status:  Discontinued     750 mg 150 mL/hr over 60 Minutes Intravenous Every 24 hours 02/21/19 1452 02/21/19 1957   02/22/19 1500  cefTRIAXone (ROCEPHIN) 1 g in sodium chloride 0.9 % 100 mL IVPB  Status:  Discontinued     1 g 200 mL/hr over 30 Minutes Intravenous Every 24 hours 02/21/19 1957 02/26/19 0936   02/21/19 1445  aztreonam (AZACTAM) 2 g in sodium chloride 0.9 % 100 mL IVPB  Status:  Discontinued     2 g 200 mL/hr over 30 Minutes Intravenous  Once 02/21/19 1432 02/21/19 1439   02/21/19 1445  ceFEPIme (MAXIPIME) 2 g in sodium chloride 0.9 % 100 mL IVPB  Status:  Discontinued     2 g 200 mL/hr over 30  Minutes Intravenous Every 8 hours 02/21/19 1444 02/21/19 1957   02/21/19 1445  vancomycin (VANCOCIN) IVPB 1000 mg/200 mL premix     1,000 mg 200 mL/hr over 60 Minutes Intravenous  Once 02/21/19 1444 02/21/19 1710     Subjective  The patient is awake, alert, and in good spirits today. No new complaints.  Objective   Vitals:  Vitals:   02/28/19 0400 02/28/19 0803  BP: 122/74 124/74  Pulse: 90   Resp: (!) 23 19  Temp:  98.3 F (36.8 C)  SpO2: 94% 100%   Exam:  Constitutional:  . The patient is resting comfortably in bed. She is Counsellor. No acute  distress. Respiratory:  . No increased work of breathing. . No wheezes, rales, or rhonchi . No tactile fremitus Cardiovascular:  . Regular rate and rhythm . No murmurs, ectopy, or gallups. . No lateral PMI. No thrills. Abdomen:  . Abdomen is soft, non-tender, non-distended . No hernias, masses, or organomegaly . Normoactive bowel sounds.  Musculoskeletal:  . No cyanosis, clubbing, or edema Skin:  . No rashes, lesions, ulcers . palpation of skin: no induration or nodules Neurologic:  . CN 2-12 intact . She is moving all extremities Psychiatric: Pt is awake and alert.  I have personally reviewed the following:   Today's Data  . Vitals, BMP  Micro Data  . BC x 2 drawn on 2/1 and 2/2 and 2/4 have had no growth . Urine culture collected on 02/22/2019 has had insignificant growth.  Imaging  CXR (02/21/2019) : Increasing upper lobe predominant interstitial and perihilar ground-glass opacities. Findings could reflect interstitial edema or atypical viral pneumonia. CTA Chest: Bilateral upper and lower lobe opacities. No PE.   Cardiology Data  . EKG - sinus tachycardia  Scheduled Meds: . acetaminophen  650 mg Oral TID  . buPROPion  150 mg Oral Daily  . Chlorhexidine Gluconate Cloth  6 each Topical Daily  . cholecalciferol  2,000 Units Oral Daily  . cyclobenzaprine  5 mg Oral Daily  . enoxaparin (LOVENOX) injection  40 mg Subcutaneous Q24H  . fentaNYL  1 patch Transdermal Q48H  . insulin aspart  0-15 Units Subcutaneous TID WC  . insulin aspart  0-5 Units Subcutaneous QHS  . insulin glargine  35 Units Subcutaneous Daily  . lactulose  10 g Oral Daily  . letrozole  2.5 mg Oral Daily  . linaclotide  290 mcg Oral QAC breakfast  . lubiprostone  24 mcg Oral Q breakfast  . memantine  5 mg Oral BID  . methylPREDNISolone (SOLU-MEDROL) injection  60 mg Intravenous Q12H  . metoCLOPramide (REGLAN) injection  5 mg Intravenous TID AC & HS  . metoprolol tartrate  12.5 mg Oral BID  .  multivitamin with minerals  1 tablet Oral Daily  . senna  1 tablet Oral BID  . temazepam  15 mg Oral QHS  . thiamine  100 mg Oral Daily   Continuous Infusions: . sodium chloride 1,000 mL (02/24/19 2000)  . ceFEPime (MAXIPIME) IV 2 g (02/28/19 0846)  . vancomycin 1,000 mg (02/27/19 1138)    Active Problems:   Left ovarian epithelial cancer ([place])   Fever   Sepsis ([place])   Severe comorbid illness   Palliative care by specialist   Goals of care, counseling/discussion   Chronic pain due to neoplasm   LOS: 6 days   A & P  Sepsis: Tachycardia, hypotension, fever, lactic acidosis, hypoxia on admission. Resolved.  Likely due to UTI, although BC x  2 is pending, and CXR demonstrated opacities consistent with either pulmonary edema or infiltrated. procalcitonin is negative. Pt is receiving empiric Rocephin pending cultures. She is now outside the 21 day window for COVID-19. However, this morning the patient again is appearing septic with fever, hypotension, tachycardia. Sepsis protocol was initiated. Although her blood pressure initially responded to bolus, the patient's oxygen requirements have increased. This has not improved with attempts at diuresis.  Acute hypoxic respiratory failure: Resolved. The patient is currently saturating at 100% on 2L by [place]. CXR from 02/21/2019 demonstrated bibasilar opacities consistent with eith3er pulmonary edema or infiltrates. She is receiving empiric rocephin and azithromycin. CTA chest has ruled out pulmonary embolus. PCCM was also consulted. They recommended continuation of antibiotics, initiation of steroids and a trial of diuresis.  She is currently saturating in the mid 90's on 6L HFNC O2 which is an improvement. Antibiotics have been changed to cefepime and vancomycin for possible HCAP. She is getting IV steroids.   Tachycardia: Resolved.   DM II: Elevated glucoses due to steroids which have been restarted. FSBS for the last 24 hours have run between 120 -  402. She is receiving correction insulin as well as Lantus 30 units daily. Continue to monitor. Hold metformin while inpatient.   Hypotension: Resolved.   Chronic constipation: Miralax bid. Monitor. Last documented BM 02/25/2019.   Dementia: Noted. The patient is awake and alert today.   I have seen and examined this patient myself. I have spent 32 minutes in her evaluation and care.  DVT prophylaxis: Lovenox CODE STATUS: DNR Family Communication: The patient's daughter, [name], was at bedside.  Disposition: The patient is from SNF. However, palliative care has noted that the daughter is interested in taking the patient home at discharge. I will ask PT/OT to evaluate the patient and make recommendations for disposition.   Felipe Cabell, DO [facility] Direct contact: see [email or web address]  7PM-7AM contact night coverage as above 02/28/2019, 11:06 PM  LOS: 0 days

## 2019-02-28 NOTE — Progress Notes (Signed)
Occupational Therapy Treatment Patient Details Name: Kristen Colon MRN: [id number] DOB: 07/03/1950 Today's Date: 02/28/2019    History of present illness Pt is a 69 y.o. female with recent admission for COVID-19 PNA with d/c to [facility] SNF (02/08/19), now readmitted 02/21/19 with fever and tachycardia. Worked up for urosepsis. Worsening pulmonary status 2/4; chest CTA negative for PE. 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   OT comments  Pt making steady progress towards OT goals this session. Pt on 2L O2 upon OT arrival with sats 93%. Pt reports having just gotten into recliner but agreeable to functional mobility. Pt reports wanting to stand up to pee,however pt with foley cath. Pt sit<>stand x2 with RW and min guard stating she needed to stand to pee into cath. Pt HR increase to 132 bpm with minimal exertion with pt returning self to seated perseverating on wanting "pee" before anything else. Assisted pt with seated grooming tasks with set- up assist. Pt would benefit from standing ADLs and functional mobility to assess balance and safety with RW next session.DC plan currently remains appropriate, will follow acutely per POC.    Follow Up Recommendations  Supervision/Assistance - 24 hour;SNF    Equipment Recommendations  None recommended by OT    Recommendations for Other Services      Precautions / Restrictions Precautions Precautions: Fall;Other (comment) Precaution Comments: Bowel incontinence; watch HR Restrictions Weight Bearing Restrictions: No       Mobility Bed Mobility               General bed mobility comments: OOB in recliner  Transfers Overall transfer level: Needs assistance Equipment used: Rolling walker (2 wheeled) Transfers: Sit to/from Stand Sit to Stand: Min guard         General transfer comment: pt sit<>stand x2 from recliner with RW and minguard assist. Pt  perseverating on needing to pee although pt with foley cath. pt hr increase to 132 bpm with minimal exertion    Balance Overall balance assessment: Needs assistance Sitting-balance support: Feet supported;No upper extremity supported Sitting balance-Leahy Scale: Good     Standing balance support: Single extremity supported Standing balance-Leahy Scale: Fair                             ADL either performed or assessed with clinical judgement   ADL Overall ADL's : Needs assistance/impaired     Grooming: Oral care;Sitting;Set up                   Toilet Transfer: Min Fish farm manager Details (indicate cue type and reason): pt sit<>stand x2 with min guard reporting needing to urinate ( although pt with foley) pt HR increase to 132 bpm, deferred further mobility         Functional mobility during ADLs: Min guard(sit<>stand only) General ADL Comments: session focus on functional sit<>stands x2 and seated grooming tasks. pt HR Increase with minimal exertion up to 132 bpm     Vision       Perception     Praxis      Cognition Arousal/Alertness: Awake/alert Behavior During Therapy: Flat affect Overall Cognitive Status: History of cognitive impairments - at baseline  General Comments: pt with [facility] for simple tasks secondary to dementia at baseline        Exercises     Shoulder Instructions       General Comments pt on 2L O2 with sats 93- 95% throughout session with HR increasing to as much as 132 bpm with minimal exertion    Pertinent Vitals/ Pain       Pain Assessment: No/denies pain  Home Living                                          Prior Functioning/Environment              Frequency  Min 2X/week        Progress Toward Goals  OT Goals(current goals can now be found in the care plan section)  Progress towards OT goals: Progressing toward goals  Acute Rehab  OT Goals Patient Stated Goal: "I need to pee" OT Goal Formulation: With patient/family Time For Goal Achievement: 03/09/19 Potential to Achieve Goals: Good  Plan Discharge plan remains appropriate    Co-evaluation                 AM-PAC OT "6 Clicks" Daily Activity     Outcome Measure   Help from another person eating meals?: A Little Help from another person taking care of personal grooming?: A Little Help from another person toileting, which includes using toliet, bedpan, or urinal?: A Little Help from another person bathing (including washing, rinsing, drying)?: A Little Help from another person to put on and taking off regular upper body clothing?: A Little Help from another person to put on and taking off regular lower body clothing?: A Little 6 Click Score: 18    End of Session Equipment Utilized During Treatment: Rolling walker;Oxygen;Other (comment)(2L O2)  OT Visit Diagnosis: Unsteadiness on feet (R26.81);Muscle weakness (generalized) (M62.81);Other symptoms and signs involving cognitive function   Activity Tolerance Patient tolerated treatment well   Patient Left in chair;with call bell/phone within reach;with chair alarm set   Nurse Communication Mobility status        Time: [id number] OT Time Calculation (min): 21 min  Charges: OT General Charges $OT Visit: 1 Visit OT Treatments $Self Care/Home Management : 8-22 mins  Lanier Clam., COTA/L Acute Rehabilitation Services [phone] [place] 02/28/2019, 12:31 PM

## 2019-02-28 NOTE — Progress Notes (Signed)
Inpatient Diabetes Program Recommendations  AACE/ADA: New Consensus Statement on Inpatient Glycemic Control (2015)  Target Ranges:  Prepandial:   less than 140 mg/dL      Peak postprandial:   less than 180 mg/dL (1-2 hours)      Critically ill patients:  140 - 180 mg/dL   Lab Results  Component Value Date   GLUCAP 115 (H) 02/28/2019   HGBA1C 8.5 (H) 02/21/2019    Review of Glycemic Control Results for NYRIAH, SETHER (MRN [id number]) as of 02/28/2019 11:38  Ref. Range 02/27/2019 07:16 02/27/2019 11:53 02/27/2019 16:49 02/27/2019 21:33 02/28/2019 08:06  Glucose-Capillary Latest Ref Range: 70 - 99 mg/dL 236 (H) 402 (H) 316 (H) 356 (H) 115 (H)   Diabetes history: DM 2 Outpatient Diabetes medications:  Humalog 2-10 units tid with meals, Metformin 500 mg with breakfast,  Current orders for Inpatient glycemic control:  Lantus 35 units daily, Novolog moderate tid with meals and HS Solumedrol 60 mg q 12 hours  Inpatient Diabetes Program Recommendations:   If appropriate, consider adding Novolog 3 units tid with meals-meal coverage (Hold if patient eats less than 50%).   Thanks  Adah Perl, RN, BC-ADM Inpatient Diabetes Coordinator Pager [phone] (8a-5p)

## 2019-02-28 NOTE — Progress Notes (Signed)
   D/w Dr Ventura Sellers  - of [facility] - ccm can sign off. [facility] will call back if needed    SIGNATURE    Dr. Brand Males, M.D., F.C.C.P,  Pulmonary and Critical Care Medicine Staff Physician, [place] Director - Interstitial Lung Disease  Program  Pulmonary [place] at [place], [place], [address]  Pager: [phone], If no answer or between  15:00h - 7:00h: call [phone] Telephone: [phone]  8:03 AM 02/28/2019

## 2019-03-01 DIAGNOSIS — R509 Fever, unspecified: Secondary | ICD-10-CM | POA: Diagnosis not present

## 2019-03-01 DIAGNOSIS — I959 Hypotension, unspecified: Secondary | ICD-10-CM | POA: Diagnosis not present

## 2019-03-01 DIAGNOSIS — A419 Sepsis, unspecified organism: Secondary | ICD-10-CM | POA: Diagnosis not present

## 2019-03-01 DIAGNOSIS — N390 Urinary tract infection, site not specified: Secondary | ICD-10-CM | POA: Diagnosis not present

## 2019-03-01 DIAGNOSIS — F0391 Unspecified dementia with behavioral disturbance: Secondary | ICD-10-CM | POA: Diagnosis not present

## 2019-03-01 LAB — CULTURE, BLOOD (ROUTINE X 2)
Culture: NO GROWTH
Culture: NO GROWTH
Special Requests: ADEQUATE
Special Requests: ADEQUATE

## 2019-03-01 LAB — GLUCOSE, CAPILLARY
Glucose-Capillary: 61 mg/dL — ABNORMAL LOW (ref 70–99)
Glucose-Capillary: 80 mg/dL (ref 70–99)
Glucose-Capillary: 153 mg/dL — ABNORMAL HIGH (ref 70–99)
Glucose-Capillary: 256 mg/dL — ABNORMAL HIGH (ref 70–99)
Glucose-Capillary: 244 mg/dL — ABNORMAL HIGH (ref 70–99)

## 2019-03-01 MED ORDER — SODIUM CHLORIDE 0.9 % IV SOLN
2.0000 g | INTRAVENOUS | Status: DC
  Administered 2019-03-01 – 2019-03-02 (×2): 2 g via INTRAVENOUS
  Filled 2019-03-01: qty 2
  Filled 2019-03-01 (×2): qty 20

## 2019-03-01 MED ORDER — INSULIN GLARGINE 100 UNIT/ML ~~LOC~~ SOLN
18.0000 [IU] | Freq: Every day | SUBCUTANEOUS | Status: DC
  Administered 2019-03-01 – 2019-03-02 (×2): 18 [IU] via SUBCUTANEOUS
  Filled 2019-03-01 (×3): qty 0.18

## 2019-03-01 NOTE — Progress Notes (Signed)
   03/01/19 1200  Clinical Encounter Type  Visited With Patient and family together;Health care provider  Visit Type Follow-up  Referral From Nurse  Consult/Referral To Chaplain  Spiritual Encounters  Spiritual Needs Other (Comment) (AD info)   Chaplain responded to consult for AD.  Chaplain and Daughter [name] had a miss communication. Chaplain was told that Lennis is competently able to make her own decisions. However, today when Chaplain and RN asked Aneira competency questions she expressed great difficulty with answering the questions. Daughter [name] said, "she has to be prompted and given information a head of time." Daughter [name] also expressed confusion with "the doctor's" comments about Sylver having ability to make her own decisions and Cheria's actual legal ability to do so. Chaplain and RN decided that the AD cannot be properly completed at this time. Daughter [name] said that Jami has an [place] already field out from a previous hospital stay in [facility]. RN and Chaplain advised [name] to bring the currently standing AD for [facility] staff to scan into Julieanna's chart. Chaplain remains available for support as needs arise.   Chaplain Resident, Evelene Croon, M Div. [phone] on-call pager

## 2019-03-01 NOTE — Progress Notes (Signed)
Palliative Medicine RN Note: Note plan for home with [facility], and liaison has spoken with family. As d/c plan is set for home with hospice, PMT will follow from a distance. If new needs arise, please contact our office. Otherwise, we will watch for the need for our re-engagement.  Marjie Skiff Jadarrius Maselli, RN, BSN, [facility] Palliative Medicine Team 03/01/2019 9:04 AM Office [phone]

## 2019-03-01 NOTE — Progress Notes (Addendum)
Physical Therapy Treatment Patient Details Name: Kristen Colon MRN: [id number] DOB: 17-Jan-1951 Today's Date: 03/01/2019    History of Present Illness Pt is a 69 y.o. female with recent admission for COVID-19 PNA with d/c to [facility] SNF (02/08/19), now readmitted 02/21/19 with fever and tachycardia. Worked up for urosepsis. Worsening pulmonary status 2/4; chest CTA negative for PE. 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   PT Comments    Pt slowly progressing with mobility. Able to transfer and walk short distances with assist+1. Pt remains limited by fatigue, generalized weakness and decreased activity tolerance. Plans to d/c home with hospice and family assist. See below for DME recommendations.   SpO2 down to 83% on RA with activity; returning to >/92% on 3L O2 HR up to 116  Seated BP 100/59 Post-mobility BP 97/53   Follow Up Recommendations  Supervision/Assistance - 24 hour(pt returning home with hospice)     Equipment Recommendations  Rolling walker with 5" wheels;3in1 (PT);Wheelchair (measurements PT);Wheelchair cushion (measurements PT); Hospital Bed    Recommendations for Other Services       Precautions / Restrictions Precautions Precautions: Fall;Other (comment) Precaution Comments: Watch vitals Restrictions Weight Bearing Restrictions: No    Mobility  Bed Mobility Overal bed mobility: Needs Assistance Bed Mobility: Supine to Sit     Supine to sit: Supervision     General bed mobility comments: increased time and effort, no physical assist required  Transfers Overall transfer level: Needs assistance Equipment used: 1 person hand held assist Transfers: Sit to/from Stand Sit to Stand: Min assist;Min guard         General transfer comment: MinA for HHA to elevate trunk and maintain balance standing from EOB; stood from elevated BSC with min guard, heavy reliance on UE  support  Ambulation/Gait Ambulation/Gait assistance: Min guard;Min assist Gait Distance (Feet): 6 Feet Assistive device: 1 person hand held assist;None Gait Pattern/deviations: Step-to pattern;Trunk flexed   Gait velocity interpretation: <1.31 ft/sec, indicative of household ambulator General Gait Details: Slow, unsteady gait with intermitent HHA, pt reaching to furniture for added stability; pt declined use of RW. SpO2 down to 83% on RA, HR up to 116   Stairs             Wheelchair Mobility    Modified Rankin (Stroke Patients Only)       Balance Overall balance assessment: Needs assistance Sitting-balance support: Feet supported;No upper extremity supported Sitting balance-Leahy Scale: Good     Standing balance support: Single extremity supported Standing balance-Leahy Scale: Fair Standing balance comment: Can static stand without UE support; dynamic stability improved with UE support                            Cognition Arousal/Alertness: Awake/alert Behavior During Therapy: WFL for tasks assessed/performed Overall Cognitive Status: History of cognitive impairments - at baseline Area of Impairment: Orientation;Attention;Memory;Safety/judgement;Awareness;Problem solving;Following commands                 Orientation Level: Disoriented to;Place;Time;Situation Current Attention Level: Sustained Memory: Decreased short-term memory Following Commands: Follows one step commands with increased time;Follows one step commands consistently Safety/Judgement: Decreased awareness of safety;Decreased awareness of deficits Awareness: Intellectual Problem Solving: Slow processing;Decreased initiation;Requires verbal cues        Exercises      General Comments        Pertinent Vitals/Pain Pain Assessment: No/denies pain  Pain Intervention(s): Monitored during session    Home Living                      Prior Function            PT  Goals (current goals can now be found in the care plan section) Progress towards PT goals: Progressing toward goals    Frequency    Min 2X/week      PT Plan Current plan remains appropriate    Co-evaluation              AM-PAC PT "6 Clicks" Mobility   Outcome Measure  Help needed turning from your back to your side while in a flat bed without using bedrails?: None Help needed moving from lying on your back to sitting on the side of a flat bed without using bedrails?: None Help needed moving to and from a bed to a chair (including a wheelchair)?: A Little Help needed standing up from a chair using your arms (e.g., wheelchair or bedside chair)?: A Little Help needed to walk in hospital room?: A Little Help needed climbing 3-5 steps with a railing? : A Little 6 Click Score: 20    End of Session Equipment Utilized During Treatment: Oxygen Activity Tolerance: Patient tolerated treatment well;Patient limited by fatigue Patient left: in chair;with call bell/phone within reach;with chair alarm set Nurse Communication: Mobility status PT Visit Diagnosis: Unsteadiness on feet (R26.81);Muscle weakness (generalized) (M62.81)     Time: [id number] PT Time Calculation (min) (ACUTE ONLY): 21 min  Charges:  $Therapeutic Activity: 8-22 mins                     Mabeline Caras, PT, DPT Acute Rehabilitation Services  Pager [phone] Office [phone]  Derry Lory 03/01/2019, 12:46 PM

## 2019-03-01 NOTE — Progress Notes (Addendum)
PROGRESS NOTE  Kristen Colon [id number] DOB: [birth year] DOA: 02/21/2019 PCP: Everardo Beals, NP  Brief History   The patient is a 69 yr old woman who is a resident of a SNF who was sent to the ED for evaluation of fever and tachycardia. She had been diagnosed with COVID-19 on 01/27/2019 and discharged back to the [facility] on 02/08/2019. She carries a past medical history significant for hypertension, DM II, schizophrenia, left epithelial ovarian cancer, dementia, and anxiety.   In the ED the patient was found to be septic with HR in the 100's, Hypotension with systolic blood pressure in the 90's, She was requiring 2L O2 by nasal cannula. She was afebrile, although per the SNF, she had been febrile there. CXR demonstrated increasing upper lobe interstitial and perihilar ground glass opacities. Procalcitonin was negative. UA was positive for UTI. She has been started on Rocephin. Blood cultures x 2 and Urine cultures have been ordered.  The patient was initially admitted to 5W on telemetry. However, as she is now outside the 21 day window for COVID 19, she will be transferred to a telemetry bed. Today she remains tachycardic with HR 120-140. EKG is pending. Her Oxygen requirements have increased to 3L. She has been confused and agitated while on 5W.  This morning the patient has had greatly increased oxygen requirements, fevers, and hypotension. She has been placed on a sepsis protocol, CTA chest was performed and was consistent with volume overload. However, pt had greatly increased oxygen requirements. Pulmonology was consulted. They also recommended lasix, trial of steroids, as well as chest physiotherapy. Rocephin has been continued. Azithromycin has been added.  I have discussed the patient with her Daughter, [name]. [name] has decided to make her Mom a DNR. She has also decided that she will be taking the patient home with 24 hour supervision.  The plan is for the patient to go home with hospice.    Consultants  . PCCM  Procedures  . None  Antibiotics   Anti-infectives (From admission, onward)   Start     Dose/Rate Route Frequency Ordered Stop   02/26/19 1100  vancomycin (VANCOCIN) IVPB 1000 mg/200 mL premix     1,000 mg 200 mL/hr over 60 Minutes Intravenous Every 24 hours 02/26/19 1010     02/26/19 1015  ceFEPIme (MAXIPIME) 2 g in sodium chloride 0.9 % 100 mL IVPB     2 g 200 mL/hr over 30 Minutes Intravenous Every 12 hours 02/26/19 1010     02/25/19 1530  azithromycin (ZITHROMAX) 500 mg in sodium chloride 0.9 % 250 mL IVPB  Status:  Discontinued     500 mg 250 mL/hr over 60 Minutes Intravenous Every 24 hours 02/25/19 1435 02/26/19 0936   02/22/19 1500  vancomycin (VANCOREADY) IVPB 750 mg/150 mL  Status:  Discontinued     750 mg 150 mL/hr over 60 Minutes Intravenous Every 24 hours 02/21/19 1452 02/21/19 1957   02/22/19 1500  cefTRIAXone (ROCEPHIN) 1 g in sodium chloride 0.9 % 100 mL IVPB  Status:  Discontinued     1 g 200 mL/hr over 30 Minutes Intravenous Every 24 hours 02/21/19 1957 02/26/19 0936   02/21/19 1445  aztreonam (AZACTAM) 2 g in sodium chloride 0.9 % 100 mL IVPB  Status:  Discontinued     2 g 200 mL/hr over 30 Minutes Intravenous  Once 02/21/19 1432 02/21/19 1439   02/21/19 1445  ceFEPIme (MAXIPIME) 2 g in sodium chloride 0.9 % 100 mL IVPB  Status:  Discontinued     2 g 200 mL/hr over 30 Minutes Intravenous Every 8 hours 02/21/19 1444 02/21/19 1957   02/21/19 1445  vancomycin (VANCOCIN) IVPB 1000 mg/200 mL premix     1,000 mg 200 mL/hr over 60 Minutes Intravenous  Once 02/21/19 1444 02/21/19 1710     Subjective  The patient is awake, alert, and in good spirits today. No new complaints.  Objective   Vitals:  Vitals:   03/01/19 1007 03/01/19 1200  BP: 109/64 91/77  Pulse: (!) 107 (!) 106  Resp:  14  Temp:    SpO2:  95%   Exam:  Constitutional:  . The patient is resting comfortably in bed. She is Counsellor. No acute  distress. Respiratory:  . No increased work of breathing. . No wheezes, rales, or rhonchi . No tactile fremitus Cardiovascular:  . Regular rate and rhythm . No murmurs, ectopy, or gallups. . No lateral PMI. No thrills. Abdomen:  . Abdomen is soft, non-tender, non-distended . No hernias, masses, or organomegaly . Normoactive bowel sounds.  Musculoskeletal:  . No cyanosis, clubbing, or edema Skin:  . No rashes, lesions, ulcers . palpation of skin: no induration or nodules Neurologic:  . CN 2-12 intact . She is moving all extremities Psychiatric: Pt is awake and alert.  I have personally reviewed the following:   Today's Data  . Vitals, BMP  Micro Data  . BC x 2 drawn on 2/1 and 2/2 and 2/4 have had no growth . Urine culture collected on 02/22/2019 has had insignificant growth.  Imaging  CXR (02/21/2019) : Increasing upper lobe predominant interstitial and perihilar ground-glass opacities. Findings could reflect interstitial edema or atypical viral pneumonia. CTA Chest: Bilateral upper and lower lobe opacities. No PE.   Cardiology Data  . EKG - sinus tachycardia  Scheduled Meds: . acetaminophen  650 mg Oral TID  . buPROPion  150 mg Oral Daily  . Chlorhexidine Gluconate Cloth  6 each Topical Daily  . cholecalciferol  2,000 Units Oral Daily  . cyclobenzaprine  5 mg Oral Daily  . enoxaparin (LOVENOX) injection  40 mg Subcutaneous Q24H  . fentaNYL  1 patch Transdermal Q48H  . insulin aspart  0-15 Units Subcutaneous TID WC  . insulin aspart  0-5 Units Subcutaneous QHS  . insulin glargine  18 Units Subcutaneous Daily  . lactulose  10 g Oral Daily  . letrozole  2.5 mg Oral Daily  . linaclotide  290 mcg Oral QAC breakfast  . lubiprostone  24 mcg Oral Q breakfast  . memantine  5 mg Oral BID  . methylPREDNISolone (SOLU-MEDROL) injection  60 mg Intravenous Q12H  . metoCLOPramide (REGLAN) injection  5 mg Intravenous TID AC & HS  . metoprolol tartrate  12.5 mg Oral BID  .  multivitamin with minerals  1 tablet Oral Daily  . senna  1 tablet Oral BID  . temazepam  15 mg Oral QHS  . thiamine  100 mg Oral Daily   Continuous Infusions: . sodium chloride 1,000 mL (02/24/19 2000)  . ceFEPime (MAXIPIME) IV 2 g (03/01/19 1028)  . vancomycin 1,000 mg (02/28/19 1121)    Active Problems:   Left ovarian epithelial cancer ([place])   Fever   Sepsis ([place])   Severe comorbid illness   Palliative care by specialist   Goals of care, counseling/discussion   Chronic pain due to neoplasm   LOS: 7 days   A & P  Sepsis: Resolved. Tachycardia, hypotension, fever, lactic acidosis, hypoxia on  admission. Resolved.  Likely due to UTI, although BC x 2 is pending, and CXR demonstrated opacities consistent with either pulmonary edema or infiltrated. procalcitonin is negative. Pt is receiving empiric Rocephin pending cultures. She was diagnosed with COVID 19 on 01/27/2019. However, on the morning of 02/24/2019 the patient again is appearing septic with fever, hypotension, tachycardia. She was started on steroids. Sepsis protocol was initiated. Although her blood pressure initially responded to bolus, the patient's oxygen requirements increased quickly from room air to 9 liters on that day. PCCM was consulted. She was started on antibiotics to cover for HCAP. She has steadily improving since that time.  Acute hypoxic respiratory failure: Resolved. This morning the  patient was saturating at 90% on RA by [place], however, this afternoon she has desated to the 80's on room air and is again requiring 2L by [place] to maintain saturations in the 90's. She has been de-escalated from  vancomycin and cefepime after being switched from rocephin and azithromycin initially. CTA chest has ruled out pulmonary embolus. PCCM was also consulted, but have now signed off. They recommended continuation of antibiotics, initiation of steroids and a trial of diuresis.  Tachycardia: Resolved.   DM II: Elevated glucoses due to  steroids which have been restarted. FSBS for the last 24 hours have run between 61 - 287. She is receiving correction insulin as well as Lantus 30 units daily. I will decrease dose of lantus due to hypoglycemia this morning. This will need to be carefully monitored as she comes off of steroids on 03/05/2019.    Hypotension: Resolved.   Chronic constipation: Miralax bid. Monitor. Last documented BM 02/28/2019.   Dementia: Noted. The patient is awake and alert today.   I have seen and examined this patient myself. I have spent 42 minutes in her evaluation and care. More than 590% of this was spent in counseling with the patient's daughter.  DVT prophylaxis: Lovenox CODE STATUS: DNR Family Communication: The patient's daughter, [name], was at bedside.  Disposition: The patient is from SNF. However, the patient's daughter, [name] states that she intends to take the patient home with hospice. I have asked for the patient to be ambulated and SaO2 to be recorded, and for PT to re-evaluate the patient for DME needs at home.  Nova Evett, DO [facility] Direct contact: see [email or web address]  7PM-7AM contact night coverage as above 03/01/2019, 1:03 PM  LOS: 0 days   ADDENDUM: The patient's antibiotics have now been de-escalated to Rocephin only. Although the daughter wants for the patient to come home with her, and not to rehab, she has expressed reservations about taking her mother home when she "looks so weak". I have explained that the patient will only get weaker as inpatient. Daughter understands that her mother will be coming home and will need 24 hour supervision.

## 2019-03-01 NOTE — Progress Notes (Addendum)
  Speech Language Pathology Treatment: Dysphagia  Patient Details Name: Kristen Colon MRN: [id number] DOB: Oct 28, 1950 Today's Date: 03/01/2019 Time: [id number] SLP Time Calculation (min) (ACUTE ONLY): 14 min  Assessment / Plan / Recommendation Clinical Impression  Patient seen at bedside for skilled ST targeting dysphagia focusing on caregiver education, daughter present at bedside. Patient upright in chair, oxygen via [place]. Daughter reports patient was coughing with thin liquids earlier this date.  Pt seen with cup and straw sips of thin liquids (water). Adequate oral acceptance, no overt s/sx aspiration observed despite thorough challenging. Pt with belching x1. Daughter reports she believes patient may have been sleepy when observed to be coughing earlier. ST provided edu re: ensuring patient upright and alert for PO intake, small bites/sips, frequent oral care. Daughter verbalized understanding. Pt seen with ice chips: adequate oral acceptance, limited bolus manipulation, allowing ice to melt prior to initiation of the swallow, no overt s/sx aspiration. Pt refused solid puree trials of applesauce, session ended 2/2 patient care needs.  Recommend continue dysphagia 1/thin liquids.   HPI HPI: 69yo female admitted 02/21/19 from SNF with tachycardia and fever. PMH: HTN, DM2, schizophrenia, dementia, anxiety, recent Covid. CXR = Chronic interstitial lung disease with increased airspace opacitiesin the right upper lobe and throughout the left lung worrisome forsuperimposed pneumonia.      SLP Plan          Recommendations  Diet recommendations: Thin liquid;Dysphagia 1 (puree) Medication Administration: Whole meds with liquid Supervision: Full supervision/cueing for compensatory strategies Compensations: Slow rate;Small sips/bites;Minimize environmental distractions Postural Changes and/or Swallow Maneuvers: Seated upright 90 degrees       24 hr supervision         Oral Care  Recommendations: Oral care BID Follow up Recommendations: 24 hour supervision/assistance SLP Visit Diagnosis: Dysphagia, unspecified (R13.10)       GO                Kristen Colon 03/01/2019, 4:15 PM  Marina Goodell, M.Ed., [facility] Acute Rehabilitation [phone]: Acute Rehab office [phone] - pager

## 2019-03-02 DIAGNOSIS — C562 Malignant neoplasm of left ovary: Secondary | ICD-10-CM | POA: Diagnosis not present

## 2019-03-02 DIAGNOSIS — A419 Sepsis, unspecified organism: Secondary | ICD-10-CM | POA: Diagnosis not present

## 2019-03-02 DIAGNOSIS — R509 Fever, unspecified: Secondary | ICD-10-CM | POA: Diagnosis not present

## 2019-03-02 DIAGNOSIS — Z7189 Other specified counseling: Secondary | ICD-10-CM | POA: Diagnosis not present

## 2019-03-02 DIAGNOSIS — G893 Neoplasm related pain (acute) (chronic): Secondary | ICD-10-CM | POA: Diagnosis not present

## 2019-03-02 LAB — GLUCOSE, CAPILLARY
Glucose-Capillary: 175 mg/dL — ABNORMAL HIGH (ref 70–99)
Glucose-Capillary: 167 mg/dL — ABNORMAL HIGH (ref 70–99)
Glucose-Capillary: 175 mg/dL — ABNORMAL HIGH (ref 70–99)
Glucose-Capillary: 91 mg/dL (ref 70–99)

## 2019-03-02 LAB — CBC WITH DIFFERENTIAL/PLATELET
Abs Immature Granulocytes: 0.22 10*3/uL — ABNORMAL HIGH (ref 0.00–0.07)
Basophils Absolute: 0 10*3/uL (ref 0.0–0.1)
Basophils Relative: 0 %
Eosinophils Absolute: 0 10*3/uL (ref 0.0–0.5)
Eosinophils Relative: 0 %
HCT: 33.2 % — ABNORMAL LOW (ref 36.0–46.0)
Hemoglobin: 10.6 g/dL — ABNORMAL LOW (ref 12.0–15.0)
Immature Granulocytes: 2 %
Lymphocytes Relative: 12 %
Lymphs Abs: 1.3 10*3/uL (ref 0.7–4.0)
MCH: 27.7 pg (ref 26.0–34.0)
MCHC: 31.9 g/dL (ref 30.0–36.0)
MCV: 86.9 fL (ref 80.0–100.0)
Monocytes Absolute: 0.1 10*3/uL (ref 0.1–1.0)
Monocytes Relative: 1 %
Neutro Abs: 9.1 10*3/uL — ABNORMAL HIGH (ref 1.7–7.7)
Neutrophils Relative %: 85 %
Platelets: 463 10*3/uL — ABNORMAL HIGH (ref 150–400)
RBC: 3.82 MIL/uL — ABNORMAL LOW (ref 3.87–5.11)
RDW: 17.6 % — ABNORMAL HIGH (ref 11.5–15.5)
WBC: 10.8 10*3/uL — ABNORMAL HIGH (ref 4.0–10.5)
nRBC: 0 % (ref 0.0–0.2)

## 2019-03-02 LAB — BASIC METABOLIC PANEL
Anion gap: 9 (ref 5–15)
BUN: 13 mg/dL (ref 8–23)
CO2: 26 mmol/L (ref 22–32)
Calcium: 9.3 mg/dL (ref 8.9–10.3)
Chloride: 102 mmol/L (ref 98–111)
Creatinine, Ser: 0.55 mg/dL (ref 0.44–1.00)
GFR calc Af Amer: >60 mL/min (ref >60)
GFR calc non Af Amer: >60 mL/min (ref >60)
Glucose, Bld: 159 mg/dL — ABNORMAL HIGH (ref 70–99)
Potassium: 4.3 mmol/L (ref 3.5–5.1)
Sodium: 137 mmol/L (ref 135–145)

## 2019-03-02 MED ORDER — METOPROLOL TARTRATE 25 MG PO TABS: 12.5000 mg | ORAL_TABLET | Freq: Two times a day (BID) | ORAL | 0 refills | Status: DC

## 2019-03-02 MED ORDER — HYDROCODONE-ACETAMINOPHEN 5-325 MG PO TABS: 1.0000 | ORAL_TABLET | Freq: Four times a day (QID) | ORAL | 0 refills | Status: DC | PRN

## 2019-03-02 MED ORDER — FENTANYL 50 MCG/HR TD PT72: 1.0000 | MEDICATED_PATCH | TRANSDERMAL | 0 refills | Status: DC

## 2019-03-02 MED ORDER — PREDNISONE 5 MG PO TABS: ORAL_TABLET | ORAL | 0 refills | Status: AC

## 2019-03-02 MED ORDER — MEMANTINE HCL 5 MG PO TABS: 5.0000 mg | ORAL_TABLET | Freq: Two times a day (BID) | ORAL | 0 refills | Status: DC

## 2019-03-02 MED ORDER — METHYLPREDNISOLONE SODIUM SUCC 40 MG IJ SOLR
40.0000 mg | Freq: Every day | INTRAMUSCULAR | Status: DC
  Administered 2019-03-03: 40 mg via INTRAVENOUS
  Filled 2019-03-02: qty 1

## 2019-03-02 MED ORDER — GLIPIZIDE 5 MG PO TABS: 5.0000 mg | ORAL_TABLET | Freq: Two times a day (BID) | ORAL | 0 refills | Status: DC

## 2019-03-02 MED ORDER — ACETAMINOPHEN 325 MG PO TABS: 650.0000 mg | ORAL_TABLET | Freq: Three times a day (TID) | ORAL | 0 refills | Status: DC | PRN

## 2019-03-02 MED ORDER — MIRTAZAPINE 15 MG PO TABS: 15.0000 mg | ORAL_TABLET | Freq: Every day | ORAL | 0 refills | Status: DC

## 2019-03-02 MED ORDER — LACTULOSE 10 GM/15ML PO SOLN: 30.0000 g | Freq: Two times a day (BID) | ORAL | 0 refills | Status: AC

## 2019-03-02 NOTE — Care Management (Addendum)
Attempted to speak with patient's daughter [name] about discharge plans today with [facility]. She had concerns about being ready to take her mother home stating that she was having problems with her heat, and she doesn't know how to care for her. I asked if she had been to the hospital to observe or assist the nurses caring for her mother in the past 8 days, and she said she had not because she was waiting for people to come fix her heat.  She had many specific questions and I told her I would have the MD call her. Her tone escalated quickly and became very aggressive. She began calling this CM names such as "Raunchy" and I informed her again I would have the MD call her and I had to terminate the conversation.  I have reported this to Olga Coaster RN [place] Supervisor.

## 2019-03-02 NOTE — Care Management (Signed)
Spoke w MD who states we will DC tomorrow, and this has been discussed and agreed upon with the daughter [name]. Confirmed w Harmon Pier of [facility] that DME was ordered yesterday including hospital bed, table, oxygen. She will add RW to the order today. Delivery should take place today pending DME company being able to arrange this with [name]. [facility] has advised her to answer calls as they may be related to scheduling delivery of said equipment.

## 2019-03-02 NOTE — Progress Notes (Signed)
PROGRESS NOTE  [name]  [id number] DOB: 08-Jun-1950 DOA: 02/21/2019 PCP: Everardo Beals, NP   Brief Narrative: Kristen Colon is a 69 y.o. female with a history of dementia, schizophrenia, T2DM, HTN, as well as advanced ovarian cancer not tolerant of chemotherapy per daughter, previously under hospice care who was diagnosed with covid-19 on 1/7, treated and discharged to [facility] 1/19, and returned to the ED on 2/1 with tachycardia, tachypnea, recurrent hypoxia, and hypotension. Urinalysis suggested UTI as a source of sepsis and she was admitted with IV fluids and antibiotics initially to 5[facility], later transferred from there given prolonged time from covid diagnosis. On 2/5 she became hypoxemic, with CTA chest showing no PE and severe emphysema, scattered volume loss vs. infiltrates in an almost NSIP pattern with diffuse GGO c/w pulmonary edema. Aspiration was also considered due to constipation/GI fullness from tumor mass effect. She was treated with broadened antibiotics, steroids, and diuresis with pulmonology consultation assisting with management. There has been some improvement though prognosis is felt to remain poor, and palliative care was consulted. Given her severe life-limiting comorbidities, namely advanced ovarian CA, and previous hospice enrollment, the patient and her daughter wish to return home with hospice support. This is being arranged.   Assessment & Plan: Active Problems:   Left ovarian epithelial cancer (HCC)   Fever   Sepsis ([place])   Severe comorbid illness   Palliative care by specialist   Goals of care, counseling/discussion   Chronic pain due to neoplasm  Sepsis, POA: Due to UTI. Tachycardia, hypotension, fever, lactic acidosis, hypoxia on admission. Resolved.  Acute hypoxemic respiratory failure due to aspiration pneumonia and pulmonary edema:  - Continue SLP to optimize diet recommendations at home.  - Continue antibiotics. Will receive 5th day of abx directed  at aspiration today, will not need further treatment after that.  - Appears euvolemic now that diuresis has been achieved. - Continue supplemental oxygen, will arrange for home O2, though needs have improved.  - For possibility of organizing pneumonia and to avoid adrenal insufficiency, will taper steroids over course of 6 days as outpatient.   T2DM with hyperglycemia:  - Continue lantus + SSI while in house, with steroid tapering and comfort care will not initiate insulin at home.   Terminal metastatic ovarian CA: Imaging at [facility] shows large peritoneal metastatic burden and large adnexal mass creating mass effect on bowel loops without clear obstruction (Oct 2020).  - Hospice care has been recommended. Will continue pain control and bowel regimen (sent to pharmacy and printed today). Patient's daughter has reported plan to use vitamins/herbal remedies to help with this condition. Also on letrozole which we'll continue.   Dementia: Noted.  - Delirium precautions.  - Continue namenda  DVT prophylaxis: Lovenox Code Status: DNR Family Communication: Daughter at bedside Disposition Plan: DC home with hospice care and significant DME on 2/11. Patient's daughter's home does not currently have functional heating and does not have required DME, so is not a safe disposition at this time.   Consultants:   Pulmonology  Palliative care  Subjective: Confused but without complaints. Denies any shortness of breath or chest pain.  Objective: Vitals:   03/01/19 2325 03/02/19 0300 03/02/19 0900 03/02/19 1116  BP:   128/70 120/67  Pulse:   70 75  Resp:   17 12  Temp: 98.1 F (36.7 C) 98.1 F (36.7 C)  (!) 97.5 F (36.4 C)  TempSrc: Oral Oral  Oral  SpO2:   99% 94%  Weight:  Height:        Intake/Output Summary (Last 24 hours) at 03/02/2019 1526 Last data filed at 03/02/2019 0800 Gross per 24 hour  Intake 480 ml  Output 400 ml  Net 80 ml   Filed Weights   02/21/19 2212  Weight: 50  kg    Gen: 69 y.o. female in no distress, thin Pulm: Non-labored breathing supplemental oxygen. Clear to auscultation bilaterally.  CV: Regular rate and rhythm. No murmur, rub, or gallop. No JVD, no pitting pedal edema. GI: Abdomen appears and feels full but nontender with +BS.  Ext: Warm, no deformities Skin: No rashes, lesions or ulcers on visualized skin Neuro: Alert and oriented x1. No focal neurological deficits. Psych: Judgement and insight appear impaired. Mood & affect appropriate.   Data Reviewed: I have personally reviewed following labs and imaging studies  CBC: Recent Labs  Lab 02/24/19 0907 02/25/19 0212 02/26/19 0232 03/02/19 0216  WBC 9.5 11.2* 13.1* 10.8*  NEUTROABS 6.2 10.4* 11.1* 9.1*  HGB 11.7* 10.8* 10.5* 10.6*  HCT 35.8* 33.0* 31.8* 33.2*  MCV 85.9 85.1 85.3 86.9  PLT 259 262 280 [id number]*   Basic Metabolic Panel: Recent Labs  Lab 02/24/19 0907 02/25/19 0212 02/26/19 0232 02/28/19 0300 03/02/19 0216  NA 135 135 140 135 137  K 3.7 4.0 4.1 3.9 4.3  CL 100 98 101 97* 102  CO2 23 22 26 25 26   GLUCOSE 252* 324* 324* 275* 159*  BUN 8 10 11 20 13   CREATININE 0.78 0.62 0.60 0.66 0.55  CALCIUM 9.0 9.0 10.3 9.2 9.3  MG  --  1.5*  --   --   --    GFR: Estimated Creatinine Clearance: 52.4 mL/min (by C-G formula based on SCr of 0.55 mg/dL). Liver Function Tests: Recent Labs  Lab 02/24/19 0907  AST 17  ALT 14  ALKPHOS 61  BILITOT 0.5  PROT 6.2*  ALBUMIN 2.3*   No results for input(s): LIPASE, AMYLASE in the last 168 hours. No results for input(s): AMMONIA in the last 168 hours. Coagulation Profile: Recent Labs  Lab 02/24/19 0907  INR 1.1   Cardiac Enzymes: No results for input(s): CKTOTAL, CKMB, CKMBINDEX, TROPONINI in the last 168 hours. BNP (last 3 results) No results for input(s): PROBNP in the last 8760 hours. HbA1C: No results for input(s): HGBA1C in the last 72 hours. CBG: Recent Labs  Lab 03/01/19 1157 03/01/19 1629 03/01/19 2033  03/02/19 0737 03/02/19 1114  GLUCAP 153* 256* 244* 175* 167*   Lipid Profile: No results for input(s): CHOL, HDL, LDLCALC, TRIG, CHOLHDL, LDLDIRECT in the last 72 hours. Thyroid Function Tests: No results for input(s): TSH, T4TOTAL, FREET4, T3FREE, THYROIDAB in the last 72 hours. Anemia Panel: No results for input(s): VITAMINB12, FOLATE, FERRITIN, TIBC, IRON, RETICCTPCT in the last 72 hours. Urine analysis:    Component Value Date/Time   COLORURINE YELLOW 02/21/2019 1600   APPEARANCEUR CLEAR 02/21/2019 1600   LABSPEC 1.022 02/21/2019 1600   PHURINE 5.0 02/21/2019 1600   GLUCOSEU >=500 (A) 02/21/2019 1600   HGBUR NEGATIVE 02/21/2019 1600   BILIRUBINUR NEGATIVE 02/21/2019 1600   KETONESUR 5 (A) 02/21/2019 1600   PROTEINUR NEGATIVE 02/21/2019 1600   UROBILINOGEN 0.2 10/23/2014 1102   NITRITE NEGATIVE 02/21/2019 1600   LEUKOCYTESUR LARGE (A) 02/21/2019 1600   Recent Results (from the past 240 hour(s))  Blood Culture (routine x 2)     Status: None   Collection Time: 02/21/19 12:26 PM   Specimen: BLOOD  Result Value Ref Range  Status   Specimen Description BLOOD LEFT ANTECUBITAL  Final   Special Requests   Final    BOTTLES DRAWN AEROBIC AND ANAEROBIC Blood Culture adequate volume   Culture   Final    NO GROWTH 5 DAYS Performed at [facility], [address], [place], [place] [address]    Report Status 02/26/2019 FINAL  Final  Blood Culture (routine x 2)     Status: None   Collection Time: 02/21/19 12:29 PM   Specimen: BLOOD RIGHT HAND  Result Value Ref Range Status   Specimen Description BLOOD RIGHT HAND  Final   Special Requests   Final    BOTTLES DRAWN AEROBIC AND ANAEROBIC Blood Culture results may not be optimal due to an excessive volume of blood received in culture bottles   Culture   Final    NO GROWTH 5 DAYS Performed at [facility], [place] [address] [place], [place] [address]    Report Status 02/26/2019 FINAL  Final  MRSA PCR Screening     Status:  None   Collection Time: 02/21/19  4:18 PM   Specimen: Nasopharyngeal  Result Value Ref Range Status   MRSA by PCR NEGATIVE NEGATIVE Final    Comment:        The GeneXpert MRSA Assay (FDA approved for NASAL specimens only), is one component of a comprehensive MRSA colonization surveillance program. It is not intended to diagnose MRSA infection nor to guide or monitor treatment for MRSA infections. Performed at [facility], [place] [address] [place], [place] [address]   Culture, Urine     Status: Abnormal   Collection Time: 02/22/19  8:32 AM   Specimen: Urine, Random  Result Value Ref Range Status   Specimen Description URINE, RANDOM  Final   Special Requests NONE  Final   Culture (A)  Final    <10,000 COLONIES/mL INSIGNIFICANT GROWTH Performed at [facility], [place] [address], [place], [place] [address]    Report Status 02/23/2019 FINAL  Final  Culture, blood (routine x 2)     Status: None   Collection Time: 02/22/19  9:50 AM   Specimen: BLOOD  Result Value Ref Range Status   Specimen Description BLOOD SITE NOT SPECIFIED  Final   Special Requests   Final    BOTTLES DRAWN AEROBIC AND ANAEROBIC Blood Culture adequate volume   Culture   Final    NO GROWTH 5 DAYS Performed at [facility], [address], [place], [place] [address]    Report Status 02/27/2019 FINAL  Final  Culture, blood (routine x 2)     Status: None   Collection Time: 02/22/19  9:55 AM   Specimen: BLOOD  Result Value Ref Range Status   Specimen Description BLOOD SITE NOT SPECIFIED  Final   Special Requests   Final    BOTTLES DRAWN AEROBIC ONLY Blood Culture results may not be optimal due to an inadequate volume of blood received in culture bottles   Culture   Final    NO GROWTH 5 DAYS Performed at [facility], [place] [address] [place], [place] [address]    Report Status 02/27/2019 FINAL  Final  Culture, blood (x 2)     Status: None   Collection Time: 02/24/19  9:44 AM    Specimen: BLOOD LEFT ARM  Result Value Ref Range Status   Specimen Description BLOOD LEFT ARM  Final   Special Requests   Final  BOTTLES DRAWN AEROBIC ONLY Blood Culture adequate volume   Culture   Final    NO GROWTH 5 DAYS Performed at [facility], [place] [address] [place], [place] [address]    Report Status 03/01/2019 FINAL  Final  Culture, blood (x 2)     Status: None   Collection Time: 02/24/19  9:44 AM   Specimen: BLOOD LEFT ARM  Result Value Ref Range Status   Specimen Description BLOOD LEFT ARM  Final   Special Requests   Final    BOTTLES DRAWN AEROBIC ONLY Blood Culture adequate volume   Culture   Final    NO GROWTH 5 DAYS Performed at [facility], [place] [address], [place], [place] [address]    Report Status 03/01/2019 FINAL  Final      Radiology Studies: No results found.  Scheduled Meds: . acetaminophen  650 mg Oral TID  . buPROPion  150 mg Oral Daily  . Chlorhexidine Gluconate Cloth  6 each Topical Daily  . cholecalciferol  2,000 Units Oral Daily  . cyclobenzaprine  5 mg Oral Daily  . enoxaparin (LOVENOX) injection  40 mg Subcutaneous Q24H  . fentaNYL  1 patch Transdermal Q48H  . insulin aspart  0-15 Units Subcutaneous TID WC  . insulin aspart  0-5 Units Subcutaneous QHS  . insulin glargine  18 Units Subcutaneous Daily  . lactulose  10 g Oral Daily  . letrozole  2.5 mg Oral Daily  . linaclotide  290 mcg Oral QAC breakfast  . lubiprostone  24 mcg Oral Q breakfast  . memantine  5 mg Oral BID  . methylPREDNISolone (SOLU-MEDROL) injection  60 mg Intravenous Q12H  . metoCLOPramide (REGLAN) injection  5 mg Intravenous TID AC & HS  . metoprolol tartrate  12.5 mg Oral BID  . multivitamin with minerals  1 tablet Oral Daily  . senna  1 tablet Oral BID  . temazepam  15 mg Oral QHS  . thiamine  100 mg Oral Daily   Continuous Infusions: . sodium chloride Stopped (03/02/19 0041)  . cefTRIAXone (ROCEPHIN)  IV 2 g (03/01/19 1536)     LOS: 8 days     Total time spent 45 minutes in seeing, examining patient, coordinating patient care, greater than 50% of which was spent on the floor and/or at the bedside.   Patrecia Pour, MD [facility] [email or web address] 03/02/2019, 3:26 PM

## 2019-03-02 NOTE — Plan of Care (Signed)

## 2019-03-02 NOTE — Progress Notes (Addendum)
  Speech Language Pathology Treatment: Dysphagia  Patient Details Name: Kristen Colon MRN: [id number] DOB: 1950/12/09 Today's Date: 03/02/2019 Time: 2393-5940 SLP Time Calculation (min) (ACUTE ONLY): 16 min  Assessment / Plan / Recommendation Clinical Impression  Pt received at bedside, alert, sitting upright in chair, on room air. Oxygen saturation stable,upper 90's to 100% per tele throughout session. Pt edentulous, dentures not in oral cavity. Oral mucosa moist, patient managing secretions well.  Pt seen with thin liquids, puree solids (applesauce and blueberry fruit-on-the-bottom yogurt) and chopped solids (chopped peaches). Oral phase remarkable for prolonged mastication and mildly prolonged AP transfer. Swallow initiation appeared timely, no overt s/sx aspiration. Pt with minimal diffuse oral residue after the swallow that cleared with subsequent swallows. No overt s/sx aspiration seen with any trials today. Recommend diet upgrade to dysphagia 2 solids, thin liquids. Patient should be upright/alert for all PO, oral care BID, cue for small bites/sips and cue to slow rate of intake. Patient should avoid wearing current dentures while eating as they are very ill-fitting/loose. Patient with planned discharge to home with home hospice tomorrow. Swallowing goal met.  Patient and RN educated re: diet upgrade to dysphagia 2 solids. Both verbalized understanding. Pr provided edu re: compensatory strategies for safe swallowing. No further ST dysphagia services indicated at this time. ST will sign off.   HPI HPI: 69yo female admitted 02/21/19 from SNF with tachycardia and fever. PMH: HTN, DM2, schizophrenia, dementia, anxiety, recent Covid. CXR = Chronic interstitial lung disease with increased airspace opacitiesin the right upper lobe and throughout the left lung worrisome forsuperimposed pneumonia.      SLP Plan  Discharge SLP treatment due to (comment)       Recommendations  Diet  recommendations: Dysphagia 2 (fine chop);Thin liquid Liquids provided via: Cup;Straw Medication Administration: Whole meds with liquid Supervision: Full supervision/cueing for compensatory strategies Compensations: Slow rate;Small sips/bites;Minimize environmental distractions Postural Changes and/or Swallow Maneuvers: Seated upright 90 degrees;Upright 30-60 min after meal                Oral Care Recommendations: Oral care BID Follow up Recommendations: 24 hour supervision/assistance SLP Visit Diagnosis: Dysphagia, unspecified (R13.10) Plan: Discharge SLP treatment due to (comment)       GO                Aleecia Tapia 03/02/2019, 3:20 PM  Marina Goodell, M.Ed., [facility] Acute Rehabilitation [phone]: Acute Rehab office [phone] - pager

## 2019-03-02 NOTE — Progress Notes (Addendum)
[facility]  Continue to follow for family interest in hospice services after discharge. Spoke with daughter Kristen Colon by phone yesterday to discuss DME. [name] requested hospital bed, over bed table, oxygen setup which has been ordered for delivery to home address in Epic. Kristen Colon is the contact to coordinate DME delivery to the home.   Will continue to follow and coordinate hospice visit at home after discharge. Please advise when discharge date is known.   Thank you,  Erling Conte, LCSW [phone] Hilma Favors are listed daily on AMION under Hospice and [place]   11:42 Addendum Received call from [facility] and voice message from MD requesting RW and wheelchair be added to DME ordered. Order has been updated with request to deliver today in anticipation of discharge to home tomorrow. Daughter Kristen Colon is contact to coordinate with DME company for delivery to home.

## 2019-03-03 DIAGNOSIS — C562 Malignant neoplasm of left ovary: Secondary | ICD-10-CM | POA: Diagnosis not present

## 2019-03-03 DIAGNOSIS — A419 Sepsis, unspecified organism: Secondary | ICD-10-CM | POA: Diagnosis not present

## 2019-03-03 DIAGNOSIS — G893 Neoplasm related pain (acute) (chronic): Secondary | ICD-10-CM | POA: Diagnosis not present

## 2019-03-03 DIAGNOSIS — Z7189 Other specified counseling: Secondary | ICD-10-CM | POA: Diagnosis not present

## 2019-03-03 DIAGNOSIS — R509 Fever, unspecified: Secondary | ICD-10-CM | POA: Diagnosis not present

## 2019-03-03 LAB — GLUCOSE, CAPILLARY
Glucose-Capillary: 58 mg/dL — ABNORMAL LOW (ref 70–99)
Glucose-Capillary: 261 mg/dL — ABNORMAL HIGH (ref 70–99)

## 2019-03-03 NOTE — Progress Notes (Signed)
Bedside report taken, PT pulled off tele leads and refused to allow reapplication. Pt. Setting off bed exit alarm, stating she is leaving but was convinced to return to bed. Refused to dress in hospital gown.

## 2019-03-03 NOTE — Progress Notes (Signed)
Occupational Therapy Treatment Patient Details Name: Kristen Colon MRN: [id number] DOB: 10-11-1950 Today's Date: 03/03/2019    History of present illness Pt is a 69 y.o. female with recent admission for COVID-19 PNA with d/c to [facility] SNF (02/08/19), now readmitted 02/21/19 with fever and tachycardia. Worked up for urosepsis. Worsening pulmonary status 2/4; chest CTA negative for PE. 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   OT comments  Patient seated in recliner and assisted to [facility] for self care needs.  Completing transfers and in room mobility with min guard assist, toileting and grooming with min guard assist.  Patient continues to be limited by decreased activity tolerance, generalized weakness and impaired balance.  She is on RA upon entry with SpO2 92%, desaturated to 83% with self care tasks, but once returned sitting recovered in approx 10 seconds with PLB to 93%; HR 90s-low 100s.  Patient plans to dc home with daughters support and hospice.  Updated dc plan below.     Follow Up Recommendations  Supervision/Assistance - 24 hour(home with hospice)    Equipment Recommendations  3 in 1 bedside commode    Recommendations for Other Services      Precautions / Restrictions Precautions Precautions: Fall;Other (comment) Precaution Comments: Watch vitals Restrictions Weight Bearing Restrictions: No       Mobility Bed Mobility               General bed mobility comments: OOB upon entry  Transfers Overall transfer level: Needs assistance Equipment used: None Transfers: Sit to/from Stand Sit to Stand: Min guard         General transfer comment: min guard for safety and balance    Balance Overall balance assessment: Needs assistance Sitting-balance support: No upper extremity supported;Feet supported Sitting balance-Leahy Scale: Good     Standing balance support: No upper  extremity supported;During functional activity Standing balance-Leahy Scale: Fair Standing balance comment: Can static stand without UE support; dynamic stability improved with UE support                           ADL either performed or assessed with clinical judgement   ADL Overall ADL's : Needs assistance/impaired     Grooming: Wash/dry hands;Min guard;Standing                   Toilet Transfer: Min guard;Ambulation;BSC   Toileting- Water quality scientist and Hygiene: Min guard;Sit to/from stand       Functional mobility during ADLs: Min guard General ADL Comments: patient limited by decreased activity tolerance, generalized weakness      Vision       Perception     Praxis      Cognition Arousal/Alertness: Awake/alert Behavior During Therapy: WFL for tasks assessed/performed Overall Cognitive Status: History of cognitive impairments - at baseline                                 General Comments: slow processing, WLF with simple tasks and know hx of cognitive deficits        Exercises     Shoulder Instructions       General Comments pt on RA upon entry with SpO2 92%, desat to 83% with minimal activity in room but recovered after sitting rest break and PLB to 93% (pt denies SOB)  Pertinent Vitals/ Pain       Pain Assessment: No/denies pain  Home Living                                          Prior Functioning/Environment              Frequency  Min 2X/week        Progress Toward Goals  OT Goals(current goals can now be found in the care plan section)  Progress towards OT goals: Progressing toward goals  Acute Rehab OT Goals Patient Stated Goal: home today  OT Goal Formulation: With patient  Plan Discharge plan needs to be updated;Frequency remains appropriate    Co-evaluation                 AM-PAC OT "6 Clicks" Daily Activity     Outcome Measure   Help from another person  eating meals?: A Little Help from another person taking care of personal grooming?: A Little Help from another person toileting, which includes using toliet, bedpan, or urinal?: A Little Help from another person bathing (including washing, rinsing, drying)?: A Little Help from another person to put on and taking off regular upper body clothing?: A Little Help from another person to put on and taking off regular lower body clothing?: A Little 6 Click Score: 18    End of Session    OT Visit Diagnosis: Unsteadiness on feet (R26.81);Muscle weakness (generalized) (M62.81);Other symptoms and signs involving cognitive function   Activity Tolerance Patient tolerated treatment well   Patient Left in chair;with call bell/phone within reach;with chair alarm set   Nurse Communication Mobility status        Time: [id number] OT Time Calculation (min): 15 min  Charges: OT General Charges $OT Visit: 1 Visit OT Treatments $Self Care/Home Management : 8-22 mins  Jolaine Artist, OT [place] Pager [phone] Office [phone]    Delight Stare 03/03/2019, 9:35 AM

## 2019-03-03 NOTE — Discharge Summary (Signed)
Physician Discharge Summary  Kristen Colon [id number] DOB: 01/09/1951 DOA: 02/21/2019  PCP: Everardo Beals, NP  Admit date: 02/21/2019 Discharge date: 03/03/2019  Admitted From: SNF Disposition: Home with hospice   Recommendations for Outpatient Follow-up:  1. Follow up with hospice at home  Home Health: hospice Equipment/Devices: hospital bed, 3 in 1, wheelchair, rolling walker Discharge Condition: Stable CODE STATUS: DNR Diet recommendation: Carb-modified dysphagia diet  Brief/Interim Summary: Kristen Colon is a 69 y.o. 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 Urinalysis suggested UTI as a source of sepsis and she was admitted with IV fluids and antibiotics initially to [facility], later transferred from there given prolonged time from covid diagnosis. On 2/5 she became hypoxemic, with CTA chest showing no PE and severe emphysema, scattered volume loss vs. infiltrates in an almost NSIP pattern with diffuse GGO c/w pulmonary edema. Aspiration was also considered due to constipation/GI fullness from tumor mass effect. She was treated with broadened antibiotics, steroids, and diuresis with pulmonology consultation assisting with management. There has been some improvement though prognosis is felt to remain poor, and palliative care was consulted. Given her severe life-limiting comorbidities, namely advanced ovarian CA, and previous hospice enrollment, the patient and her daughter wish to return home with hospice support.  Discharge Diagnoses:  Active Problems:   Left ovarian epithelial cancer (HCC)   Fever   Sepsis ([place])   Severe comorbid illness   Palliative care by specialist   Goals  of care, counseling/discussion   Chronic pain due to neoplasm  Sepsis, POA: Due to UTI. Tachycardia, hypotension, fever, lactic acidosis, hypoxia on admission. Resolved.  Acute hypoxemic respiratory failure due to aspiration pneumonia and pulmonary edema:  - Continue dysphagia diet - Completed course of antibiotics in the hospital - Appears euvolemic at discharge - Continue supplemental oxygen, will arrange for home O2, though needs have improved.  - For possibility of organizing pneumonia and to avoid adrenal insufficiency, will taper steroids over course of 6 days as outpatient.   T2DM with hyperglycemia:  - With steroid tapering and comfort care will not initiate insulin at home.   Terminal metastatic ovarian CA: Imaging at [facility] shows large peritoneal metastatic burden and large adnexal mass creating mass effect on bowel loops without clear obstruction (Oct 2020).  - Hospice care has been recommended. Will continue pain control and bowel regimen (sent to pharmacy and printed today). Patient's daughter has reported plan to use vitamins/herbal remedies to help with this condition. Also on letrozole which we'll continue.   Dementia: Noted.  - Delirium precautions.  - Continue namenda  Discharge Instructions Discharge Instructions    Diet - low sodium heart healthy   Complete by: As directed    Discharge instructions   Complete by: As directed    Many medication changes have been made as we have discussed at the bedside this morning. Please see medication list for what to take and what to no longer take.  - The only new medication from this hospitalization is prednisone which will be tapered over the next few days.  - Any medications you reported not having at home have been prescribed/sent to [facility].  - Follow up with Hospice at home for further recommendations. - You will  get a hospital bed, wheelchair, rolling walker, and supplemental oxygen at home.     Allergies as  of 03/03/2019      Reactions   Penicillins Other (See Comments)   Edema Did it involve swelling of the face/tongue/throat, SOB, or low BP? Unknown Did it involve sudden or severe rash/hives, skin peeling, or any reaction on the inside of your mouth or nose? Unknown Did you need to seek medical attention at a hospital or doctor's office? Unknown When did it last happen?unknown If all above answers are "NO", may proceed with cephalosporin use. Daughter was unaware of allergy   5-alpha Reductase Inhibitors    Percocet [oxycodone-acetaminophen] Itching      Medication List    STOP taking these medications   amLODipine 5 MG tablet Commonly known as: NORVASC   Eliquis 2.5 MG Tabs tablet Generic drug: apixaban   insulin lispro 100 UNIT/ML injection Commonly known as: HUMALOG   lisinopril 10 MG tablet Commonly known as: ZESTRIL   metFORMIN 500 MG tablet Commonly known as: GLUCOPHAGE   temazepam 15 MG capsule Commonly known as: RESTORIL     TAKE these medications   acetaminophen 325 MG tablet Commonly known as: TYLENOL Take 2 tablets (650 mg total) by mouth every 8 (eight) hours as needed for mild pain or moderate pain. What changed:   medication strength  how much to take  reasons to take this   baclofen 10 MG tablet Commonly known as: LIORESAL Take 10 mg by mouth daily as needed for muscle spasms.   buPROPion 75 MG tablet Commonly known as: WELLBUTRIN Take 150 mg by mouth daily.   cyclobenzaprine 5 MG tablet Commonly known as: FLEXERIL Take 5 mg by mouth daily.   diazepam 5 MG tablet Commonly known as: VALIUM Take 5 mg by mouth every 8 (eight) hours as needed for anxiety.   fentaNYL 50 MCG/HR Commonly known as: [place] 1 patch onto the skin every 3 (three) days. What changed:   when to take this  additional instructions   glipiZIDE 5 MG tablet Commonly known as: GLUCOTROL Take 1 tablet (5 mg total) by mouth 2 (two) times daily.    HYDROcodone-acetaminophen 5-325 MG tablet Commonly known as: NORCO/VICODIN Take 1 tablet by mouth every 6 (six) hours as needed for moderate pain. What changed: when to take this   [place] 156 MG/ML Susp injection Generic drug: paliperidone Inject 1 mL (156 mg total) into the muscle every 30 (thirty) days.   lactulose 10 GM/15ML solution Commonly known as: CHRONULAC Take 45 mLs (30 g total) by mouth 2 (two) times daily for 11 days. What changed:   how much to take  when to take this   letrozole 2.5 MG tablet Commonly known as: FEMARA Take 2.5 mg by mouth daily.   linaclotide 290 MCG Caps capsule Commonly known as: Linzess Take 1 capsule (290 mcg total) by mouth daily.   lubiprostone 24 MCG capsule Commonly known as: AMITIZA Take 24 mcg by mouth daily.   memantine 5 MG tablet Commonly known as: NAMENDA Take 1 tablet (5 mg total) by mouth 2 (two) times daily.   metoprolol tartrate 25 MG tablet Commonly known as: LOPRESSOR Take 0.5 tablets (12.5 mg total) by mouth 2 (two) times daily.   mirtazapine 15 MG tablet Commonly known as: Remeron Take 1 tablet (15 mg total) by mouth at bedtime.   multivitamin with minerals Tabs tablet Take 1 tablet by mouth daily.   ondansetron 4 MG tablet  Commonly known as: ZOFRAN Take 4 mg by mouth every 8 (eight) hours as needed for nausea or vomiting.   predniSONE 5 MG tablet Commonly known as: DELTASONE Take 4 tablets (20 mg total) by mouth daily for 1 day, THEN 2 tablets (10 mg total) daily for 1 day, THEN 1 tablet (5 mg total) daily for 4 days. Start taking on: March 02, 2019   QUEtiapine 100 MG tablet Commonly known as: SEROQUEL Take 100 mg by mouth every 12 (twelve) hours as needed (sleep/agitation).   risperiDONE 0.25 MG tablet Commonly known as: RISPERDAL Take 0.25 mg by mouth 2 (two) times daily as needed (agitation).   senna 8.6 MG tablet Commonly known as: SENOKOT Take 1 tablet by mouth 2 (two) times  daily.   thiamine 100 MG tablet Take 1 tablet (100 mg total) by mouth daily.   Vitamin C 500 MG Caps Take 1,000 mg by mouth daily.   Vitamin D 50 MCG (2000 UT) tablet Take 2,000 Units by mouth daily.   zinc sulfate 220 (50 Zn) MG capsule Take 220 mg by mouth daily.            Durable Medical Equipment  (From admission, onward)         Start     Ordered   03/02/19 1107  For home use only DME Hospital bed  Once    Question Answer Comment  Length of Need 6 Months   Bed type Semi-electric      03/02/19 1106   03/02/19 1107  For home use only DME Walker rolling  Once    Question Answer Comment  Walker: With [place]   Patient needs a walker to treat with the following condition Muscular deconditioning      03/02/19 1106   03/02/19 1106  For home use only DME standard manual wheelchair with seat cushion  Once    Comments: Patient suffers from deconditioning, ovarian cancer, respiratory failure which impairs their ability to perform daily activities like bathing, dressing, feeding and grooming in the home.  A cane or crutch will not resolve issue with performing activities of daily living. A wheelchair will allow patient to safely perform daily activities. Patient can safely propel the wheelchair in the home or has a caregiver who can provide assistance. Length of need 6 months . Accessories: elevating leg rests (ELRs), wheel locks, extensions and anti-tippers.   03/02/19 1106          Allergies  Allergen Reactions  . Penicillins Other (See Comments)    Edema Did it involve swelling of the face/tongue/throat, SOB, or low BP? Unknown Did it involve sudden or severe rash/hives, skin peeling, or any reaction on the inside of your mouth or nose? Unknown Did you need to seek medical attention at a hospital or doctor's office? Unknown When did it last happen?unknown If all above answers are "NO", may proceed with cephalosporin use. Daughter was unaware of allergy   . 5-Alpha Reductase Inhibitors   . Percocet [Oxycodone-Acetaminophen] Itching    Consultations:  Palliative care   PCCM  Procedures/Studies: CT ANGIO CHEST PE W OR WO CONTRAST  Result Date: 02/24/2019 CLINICAL DATA:  Hypoxemia. EXAM: CT ANGIOGRAPHY CHEST WITH CONTRAST TECHNIQUE: Multidetector CT imaging of the chest was performed using the standard protocol during bolus administration of intravenous contrast. Multiplanar CT image reconstructions and MIPs were obtained to evaluate the vascular anatomy. CONTRAST:  162mL OMNIPAQUE IOHEXOL 350 MG/ML SOLN COMPARISON:  January 27, 2019 FINDINGS: Cardiovascular: There  is mild calcification of the thoracic aorta. Satisfactory opacification of the pulmonary arteries to the segmental level. No evidence of pulmonary embolism. Normal heart size. No pericardial effusion. Mediastinum/Nodes: No enlarged mediastinal, hilar, or axillary lymph nodes. Thyroid gland, trachea, and esophagus demonstrate no significant findings. Lungs/Pleura: Diffuse interstitial thickening is seen. Mild to moderate severity areas of atelectasis and/or infiltrate are seen within the bilateral upper lobes and posterolateral aspect of the right lung base. There is no evidence of a pleural effusion or pneumothorax. Upper Abdomen: Numerous subcentimeter gallstones are seen within the lumen of an otherwise normal-appearing gallbladder. Musculoskeletal: Multilevel degenerative changes seen throughout the thoracic spine. Review of the MIP images confirms the above findings. IMPRESSION: 1. No CT evidence of pulmonary embolism. 2. Diffuse interstitial thickening which may represent sequelae associated with interstitial edema. 3. Mild to moderate severity bilateral upper lobe and right lower lobe atelectasis and/or infiltrate. 4. Cholelithiasis. Aortic Atherosclerosis (ICD10-I70.0). Electronically Signed   By: Virgina Norfolk M.D.   On: 02/24/2019 16:28   DG CHEST PORT 1 VIEW  Result Date:  02/25/2019 CLINICAL DATA:  Proxy a, chest pain and cough. EXAM: PORTABLE CHEST 1 VIEW COMPARISON:  CT chest and single view of the chest 02/24/2019. PA and lateral chest 07/29/2017. FINDINGS: Changes of chronic interstitial lung disease are again seen. There are increased airspace opacities in the upper lobes bilaterally and in the left mid and lower lung zones. Heart size is normal. No pneumothorax or pleural effusion. No acute or focal bony abnormality. IMPRESSION: Chronic interstitial lung disease with increased airspace opacities in the right upper lobe and throughout the left lung worrisome for superimposed pneumonia. Electronically Signed   By: Inge Rise M.D.   On: 02/25/2019 08:15   DG Chest Port 1 View  Result Date: 02/21/2019 CLINICAL DATA:  Tachycardia, tachypnea, febrile EXAM: PORTABLE CHEST 1 VIEW COMPARISON:  01/27/2019, 04/14/2017 FINDINGS: Single frontal view of the chest demonstrates a stable cardiac silhouette. Interval progression of upper lobe predominant interstitial prominence, with mild perihilar ground-glass airspace disease. No effusion or pneumothorax. No acute bony abnormalities. IMPRESSION: 1. Increasing upper lobe predominant interstitial and perihilar ground-glass opacities. Findings could reflect interstitial edema or atypical viral pneumonia. Electronically Signed   By: Randa Ngo M.D.   On: 02/21/2019 13:00   ECHOCARDIOGRAM COMPLETE  Result Date: 02/25/2019   ECHOCARDIOGRAM REPORT   Patient Name:   Kristen Colon Date of Exam: 02/25/2019 Medical Rec #:  [id number]       Height:       65.0 in Accession #:    [id number]      Weight:       110.2 lb Date of Birth:  12/31/1950       BSA:          1.54 m Patient Age:    28 years        BP:           121/71 mmHg Patient Gender: F               HR:           90 bpm. Exam Location:  Inpatient Procedure: 2D Echo, Cardiac Doppler and Color Doppler Indications:    Shortness of breath  History:        Patient has no prior history of  Echocardiogram examinations.                 Signs/Symptoms:Altered Mental Status; Risk Factors:Hypertension.  Sepsis, Schizophrenia.  Sonographer:    Dustin Flock Referring Phys: [place]  1. Left ventricular ejection fraction, by visual estimation, is 55 to 60%. The left ventricle has normal function. There is mildly increased left ventricular hypertrophy.  2. Global right ventricle has normal systolic function.The right ventricular size is normal.  3. Left atrial size was normal.  4. Right atrial size was normal.  5. Trivial pericardial effusion is present.  6. The mitral valve is normal in structure. No evidence of mitral valve regurgitation.  7. The tricuspid valve is normal in structure. Tricuspid valve regurgitation is trivial.  8. The aortic valve is tricuspid. Aortic valve regurgitation is not visualized. No evidence of aortic valve stenosis.  9. The pulmonic valve was not well visualized. Pulmonic valve regurgitation is not visualized. 10. The inferior vena cava is normal in size with greater than 50% respiratory variability, suggesting right atrial pressure of 3 mmHg. 11. The tricuspid regurgitant velocity is 2.47 m/s, and with an assumed right atrial pressure of 3 mmHg, the estimated right ventricular systolic pressure is normal at 27.4 mmHg. FINDINGS  Left Ventricle: Left ventricular ejection fraction, by visual estimation, is 55 to 60%. The left ventricle has normal function. The left ventricle has no regional wall motion abnormalities. There is mildly increased left ventricular hypertrophy. Left ventricular diastolic parameters were normal. Right Ventricle: The right ventricular size is normal. No increase in right ventricular wall thickness. Global RV systolic function is has normal systolic function. The tricuspid regurgitant velocity is 2.47 m/s, and with an assumed right atrial pressure  of 3 mmHg, the estimated right ventricular systolic pressure is  normal at 27.4 mmHg. Left Atrium: Left atrial size was normal in size. Right Atrium: Right atrial size was normal in size Pericardium: Trivial pericardial effusion is present. Mitral Valve: The mitral valve is normal in structure. No evidence of mitral valve regurgitation. Tricuspid Valve: The tricuspid valve is normal in structure. Tricuspid valve regurgitation is trivial. Aortic Valve: The aortic valve is tricuspid. Aortic valve regurgitation is not visualized. The aortic valve is structurally normal, with no evidence of sclerosis or stenosis. Pulmonic Valve: The pulmonic valve was not well visualized. Pulmonic valve regurgitation is not visualized. Pulmonic regurgitation is not visualized. Aorta: The aortic root is normal in size and structure. Venous: The inferior vena cava is normal in size with greater than 50% respiratory variability, suggesting right atrial pressure of 3 mmHg. IAS/Shunts: The interatrial septum was not well visualized.  LEFT VENTRICLE PLAX 2D LVIDd:         4.00 cm  Diastology LVIDs:         3.10 cm  LV e' lateral:   8.27 cm/s LV PW:         1.00 cm  LV E/e' lateral: 8.5 LV IVS:        1.00 cm  LV e' medial:    7.07 cm/s LVOT diam:     1.90 cm  LV E/e' medial:  9.9 LV SV:         32 ml LV SV Index:   21.31 LVOT Area:     2.84 cm  RIGHT VENTRICLE RV Basal diam:  2.40 cm RV S prime:     12.70 cm/s TAPSE (M-mode): 1.6 cm LEFT ATRIUM             Index       RIGHT ATRIUM           Index LA diam:  3.00 cm 1.95 cm/m  RA Area:     12.70 cm LA Vol (A2C):   27.8 ml 18.10 ml/m RA Volume:   31.20 ml  20.32 ml/m LA Vol (A4C):   21.5 ml 14.00 ml/m LA Biplane Vol: 26.4 ml 17.19 ml/m  AORTIC VALVE LVOT Vmax:   86.80 cm/s LVOT Vmean:  59.900 cm/s LVOT VTI:    0.133 m  AORTA Ao Root diam: 2.80 cm MITRAL VALVE                         TRICUSPID VALVE MV Area (PHT): 9.60 cm              TR Peak grad:   24.4 mmHg MV PHT:        22.91 msec            TR Vmax:        247.00 cm/s MV Decel Time: 79 msec  MV E velocity: 70.30 cm/s  103 cm/s  SHUNTS MV A velocity: 101.00 cm/s 70.3 cm/s Systemic VTI:  0.13 m MV E/A ratio:  0.70        1.5       Systemic Diam: 1.90 cm  Oswaldo Milian MD Electronically signed by Oswaldo Milian MD Signature Date/Time: 02/25/2019/4:01:45 PM    Final      Subjective: No complaints, no pain or dyspnea. Wants to go home.   Discharge Exam: Vitals:   03/03/19 0744 03/03/19 1149  BP: 100/84 117/82  Pulse: 91 69  Resp: 18 17  Temp: 98.3 F (36.8 C) 98.5 F (36.9 C)  SpO2: 93% 94%   General: Frail female in no acute distress Cardiovascular: RRR, S1/S2 +, no rubs, no gallops Respiratory: Nonlabored and clear Abdominal: Soft, NT, ND, bowel sounds + Extremities: No edema, no cyanosis  Labs: BNP (last 3 results) Recent Labs    02/21/19 1235 02/24/19 1831  BNP 31.4 0000000   Basic Metabolic Panel: Recent Labs  Lab 02/25/19 0212 02/26/19 0232 02/28/19 0300 03/02/19 0216  NA 135 140 135 137  K 4.0 4.1 3.9 4.3  CL 98 101 97* 102  CO2 22 26 25 26   GLUCOSE 324* 324* 275* 159*  BUN 10 11 20 13   CREATININE 0.62 0.60 0.66 0.55  CALCIUM 9.0 10.3 9.2 9.3  MG 1.5*  --   --   --    Liver Function Tests: No results for input(s): AST, ALT, ALKPHOS, BILITOT, PROT, ALBUMIN in the last 168 hours. No results for input(s): LIPASE, AMYLASE in the last 168 hours. No results for input(s): AMMONIA in the last 168 hours. CBC: Recent Labs  Lab 02/25/19 0212 02/26/19 0232 03/02/19 0216  WBC 11.2* 13.1* 10.8*  NEUTROABS 10.4* 11.1* 9.1*  HGB 10.8* 10.5* 10.6*  HCT 33.0* 31.8* 33.2*  MCV 85.1 85.3 86.9  PLT 262 280 463*   Cardiac Enzymes: No results for input(s): CKTOTAL, CKMB, CKMBINDEX, TROPONINI in the last 168 hours. BNP: Invalid input(s): POCBNP CBG: Recent Labs  Lab 03/02/19 1114 03/02/19 1621 03/02/19 2228 03/03/19 0654 03/03/19 1145  GLUCAP 167* 175* 91 58* 261*   D-Dimer No results for input(s): DDIMER in the last 72 hours. Hgb  A1c No results for input(s): HGBA1C in the last 72 hours. Lipid Profile No results for input(s): CHOL, HDL, LDLCALC, TRIG, CHOLHDL, LDLDIRECT in the last 72 hours. Thyroid function studies No results for input(s): TSH, T4TOTAL, T3FREE, THYROIDAB in the last 72 hours.  Invalid input(s): FREET3  Anemia work up No results for input(s): VITAMINB12, FOLATE, FERRITIN, TIBC, IRON, RETICCTPCT in the last 72 hours. Urinalysis    Component Value Date/Time   COLORURINE YELLOW 02/21/2019 1600   APPEARANCEUR CLEAR 02/21/2019 1600   LABSPEC 1.022 02/21/2019 1600   PHURINE 5.0 02/21/2019 1600   GLUCOSEU >=500 (A) 02/21/2019 1600   HGBUR NEGATIVE 02/21/2019 1600   BILIRUBINUR NEGATIVE 02/21/2019 1600   KETONESUR 5 (A) 02/21/2019 1600   PROTEINUR NEGATIVE 02/21/2019 1600   UROBILINOGEN 0.2 10/23/2014 1102   NITRITE NEGATIVE 02/21/2019 1600   LEUKOCYTESUR LARGE (A) 02/21/2019 1600    Microbiology Recent Results (from the past 240 hour(s))  Blood Culture (routine x 2)     Status: None   Collection Time: 02/21/19 12:26 PM   Specimen: BLOOD  Result Value Ref Range Status   Specimen Description BLOOD LEFT ANTECUBITAL  Final   Special Requests   Final    BOTTLES DRAWN AEROBIC AND ANAEROBIC Blood Culture adequate volume   Culture   Final    NO GROWTH 5 DAYS Performed at [facility], [address] [place], [place] [address]    Report Status 02/26/2019 FINAL  Final  Blood Culture (routine x 2)     Status: None   Collection Time: 02/21/19 12:29 PM   Specimen: BLOOD RIGHT HAND  Result Value Ref Range Status   Specimen Description BLOOD RIGHT HAND  Final   Special Requests   Final    BOTTLES DRAWN AEROBIC AND ANAEROBIC Blood Culture results may not be optimal due to an excessive volume of blood received in culture bottles   Culture   Final    NO GROWTH 5 DAYS Performed at [facility], [place] [address], [place], [place] [address]    Report Status 02/26/2019 FINAL  Final  MRSA PCR  Screening     Status: None   Collection Time: 02/21/19  4:18 PM   Specimen: Nasopharyngeal  Result Value Ref Range Status   MRSA by PCR NEGATIVE NEGATIVE Final    Comment:        The GeneXpert MRSA Assay (FDA approved for NASAL specimens only), is one component of a comprehensive MRSA colonization surveillance program. It is not intended to diagnose MRSA infection nor to guide or monitor treatment for MRSA infections. Performed at [facility], [place] [address], [place], [place] [address]   Culture, Urine     Status: Abnormal   Collection Time: 02/22/19  8:32 AM   Specimen: Urine, Random  Result Value Ref Range Status   Specimen Description URINE, RANDOM  Final   Special Requests NONE  Final   Culture (A)  Final    <10,000 COLONIES/mL INSIGNIFICANT GROWTH Performed at [facility], [place] [address] [place], [place] [address]    Report Status 02/23/2019 FINAL  Final  Culture, blood (routine x 2)     Status: None   Collection Time: 02/22/19  9:50 AM   Specimen: BLOOD  Result Value Ref Range Status   Specimen Description BLOOD SITE NOT SPECIFIED  Final   Special Requests   Final    BOTTLES DRAWN AEROBIC AND ANAEROBIC Blood Culture adequate volume   Culture   Final    NO GROWTH 5 DAYS Performed at [facility], [address], [place],  [address]    Report Status 02/27/2019 FINAL  Final  Culture, blood (routine x 2)     Status: None   Collection Time: 02/22/19  9:55 AM   Specimen: BLOOD  Result Value Ref Range Status   Specimen Description BLOOD SITE NOT SPECIFIED  Final   Special Requests   Final    BOTTLES DRAWN AEROBIC ONLY Blood Culture results may not be optimal due to an inadequate volume of blood received in culture bottles   Culture   Final    NO GROWTH 5 DAYS Performed at [facility], [place] [address], [place], [place] [address]    Report Status 02/27/2019 FINAL  Final  Culture, blood (x 2)     Status: None   Collection Time:  02/24/19  9:44 AM   Specimen: BLOOD LEFT ARM  Result Value Ref Range Status   Specimen Description BLOOD LEFT ARM  Final   Special Requests   Final    BOTTLES DRAWN AEROBIC ONLY Blood Culture adequate volume   Culture   Final    NO GROWTH 5 DAYS Performed at [facility], [place] [address], [place], [place] [address]    Report Status 03/01/2019 FINAL  Final  Culture, blood (x 2)     Status: None   Collection Time: 02/24/19  9:44 AM   Specimen: BLOOD LEFT ARM  Result Value Ref Range Status   Specimen Description BLOOD LEFT ARM  Final   Special Requests   Final    BOTTLES DRAWN AEROBIC ONLY Blood Culture adequate volume   Culture   Final    NO GROWTH 5 DAYS Performed at [facility], [place] [address] [place],  [address]    Report Status 03/01/2019 FINAL  Final    Time coordinating discharge: Approximately 40 minutes  Patrecia Pour, MD  [facility] 03/03/2019, 12:24 PM

## 2019-03-03 NOTE — TOC Transition Note (Signed)
Transition of Care [facility]) - CM/SW Discharge Note   Patient Details  Name: Kristen Colon MRN: [id number] Date of Birth: 1950/06/13  Transition of Care [facility]) CM/SW Contact:  Pollie Friar, RN Phone Number: 03/03/2019, 10:29 AM   Clinical Narrative:    Pt discharging home with hospice through [facility]. Anderson Malta with [facility] left CM a message that the DME needed for home has been delivered.  Anderson Malta states the daughter to provide transportation home around noon. D/c packet started and in the chart. Bedside RN updated.   Final next level of care: Home w Hospice Care Barriers to Discharge: No Barriers Identified   Patient Goals and CMS Choice   CMS Medicare.gov Compare Post Acute Care list provided to:: Patient Represenative (must comment) Choice offered to / list presented to : Adult Children  Discharge Placement                       Discharge Plan and Services                                     Social Determinants of Health (SDOH) Interventions     Readmission Risk Interventions No flowsheet data found.

## 2019-03-03 NOTE — Progress Notes (Addendum)
Pt continuously and frequently pulls heart monitor leads off, o2 probe off, turns monitors off by pressing the power button - telemetry informed, lines and wires replaced and hidden each time.   0526: Extensive phone conversation with daughter. Daughter would like her mother fully dressed before her arrival - would also like disposable pads available for her at that time - and then undress her when she arrives to put depends on and redress her. She does not want her mother in a patient gown.   Daughter says she will arrive at around 68.  She has a few questions -  -Can she have a small o2 tank to transport mother -Will she "be okay" without o2 for 10 minutes -Hospice has not called her back after she called repeatedly   Wants MD to call her when they round  [phone]

## 2019-04-27 ENCOUNTER — Emergency Department (HOSPITAL_COMMUNITY)
Admission: EM | Admit: 2019-04-27 | Discharge: 2019-04-28 | Disposition: A | Discharge status: Home / Self Care | Payer: Medicare Other | Attending: Emergency Medicine | Admitting: Emergency Medicine

## 2019-04-27 ENCOUNTER — Emergency Department (HOSPITAL_COMMUNITY): Payer: Medicare Other

## 2019-04-27 ENCOUNTER — Encounter (HOSPITAL_COMMUNITY): Payer: Self-pay | Admitting: Pediatrics

## 2019-04-27 ENCOUNTER — Other Ambulatory Visit: Payer: Self-pay

## 2019-04-27 DIAGNOSIS — M7989 Other specified soft tissue disorders: Secondary | ICD-10-CM

## 2019-04-27 DIAGNOSIS — I10 Essential (primary) hypertension: Secondary | ICD-10-CM | POA: Insufficient documentation

## 2019-04-27 DIAGNOSIS — R0789 Other chest pain: Secondary | ICD-10-CM | POA: Insufficient documentation

## 2019-04-27 DIAGNOSIS — F0390 Unspecified dementia without behavioral disturbance: Secondary | ICD-10-CM | POA: Diagnosis not present

## 2019-04-27 DIAGNOSIS — Z8543 Personal history of malignant neoplasm of ovary: Secondary | ICD-10-CM | POA: Insufficient documentation

## 2019-04-27 DIAGNOSIS — R079 Chest pain, unspecified: Secondary | ICD-10-CM

## 2019-04-27 DIAGNOSIS — F17210 Nicotine dependence, cigarettes, uncomplicated: Secondary | ICD-10-CM | POA: Insufficient documentation

## 2019-04-27 DIAGNOSIS — Z8616 Personal history of COVID-19: Secondary | ICD-10-CM | POA: Insufficient documentation

## 2019-04-27 LAB — CBC
HCT: 35.2 % — ABNORMAL LOW (ref 36.0–46.0)
Hemoglobin: 11 g/dL — ABNORMAL LOW (ref 12.0–15.0)
MCH: 28 pg (ref 26.0–34.0)
MCHC: 31.3 g/dL (ref 30.0–36.0)
MCV: 89.6 fL (ref 80.0–100.0)
Platelets: 421 10*3/uL — ABNORMAL HIGH (ref 150–400)
RBC: 3.93 MIL/uL (ref 3.87–5.11)
RDW: 15.7 % — ABNORMAL HIGH (ref 11.5–15.5)
WBC: 7.9 10*3/uL (ref 4.0–10.5)
nRBC: 0 % (ref 0.0–0.2)

## 2019-04-27 LAB — BASIC METABOLIC PANEL
Anion gap: 11 (ref 5–15)
BUN: 7 mg/dL — ABNORMAL LOW (ref 8–23)
CO2: 22 mmol/L (ref 22–32)
Calcium: 9.5 mg/dL (ref 8.9–10.3)
Chloride: 104 mmol/L (ref 98–111)
Creatinine, Ser: 0.53 mg/dL (ref 0.44–1.00)
GFR calc Af Amer: >60 mL/min (ref >60)
GFR calc non Af Amer: >60 mL/min (ref >60)
Glucose, Bld: 110 mg/dL — ABNORMAL HIGH (ref 70–99)
Potassium: 4 mmol/L (ref 3.5–5.1)
Sodium: 137 mmol/L (ref 135–145)

## 2019-04-27 LAB — TROPONIN I (HIGH SENSITIVITY): Troponin I (High Sensitivity): 3 ng/L (ref <18)

## 2019-04-27 MED ORDER — IOHEXOL 350 MG/ML SOLN
75.0000 mL | Freq: Once | INTRAVENOUS | Status: AC | PRN
  Administered 2019-04-27: 75 mL via INTRAVENOUS

## 2019-04-27 MED ORDER — SODIUM CHLORIDE 0.9% FLUSH: 3.0000 mL | Freq: Once | INTRAVENOUS | Status: DC

## 2019-04-27 NOTE — ED Provider Notes (Signed)
Emergency Department Provider Note   I have reviewed the triage vital signs and the nursing notes.   HISTORY  Chief Complaint Chest Pain   HPI Kristen Colon is a 69 y.o. 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  Patient has dementia and history schizophrenia is unable to offer much history as she is agitated to be here.  Her daughter states that she started having some left lower extremity swelling a couple weeks ago she had D-dimer checked which was positive so they did an ultrasound of her left lower extremity without any evidence of DVT.  Approximately 10 days ago the patient had CT of her chest abdomen pelvis in order to evaluate for spread of her tumor burden.  I cannot review this but it appears that she does have some significant spread in the abdomen pelvis but none in the chest besides known pleural effusions.  Patient was doing well until earlier today when she started having chest pain.  She has some associated shortness of breath but not significantly worse than her baseline.  Left lower extremity still swollen but not as bad.  Right lower extremity is not.  She had no fevers or cough.  No dyspnea on exertion.  No other associated symptoms.   No other associated or modifying symptoms.    Past Medical History:  Diagnosis Date  . Anxiety   . Dementia ([place])   . Dyspnea   . Headache   . Hypertension   . Schizophrenia [facility])    paranoid    Patient Active Problem List   Diagnosis Date Noted  . Palliative care by specialist   . Goals of care, counseling/discussion   . Chronic pain due to neoplasm   . Severe comorbid illness   . Sepsis ([place]) 02/22/2019  . Fever 02/21/2019  . Pneumonia due to COVID-19 virus 01/29/2019  . Acute respiratory failure with hypoxia  ([place]) 01/27/2019  . COVID-19 virus infection 01/27/2019  . Pelvic mass in female 06/30/2017  . 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 Elevated CA-125 05/12/2017  . 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 Left ovarian epithelial cancer ([place]) 04/14/2017  . Ovarian mass 09/20/2016  . Non-intractable vomiting with nausea 09/20/2016  . Hypertension, uncontrolled 09/20/2016  . Diabetes mellitus type 2 in nonobese ([place]) 09/20/2016  . Acute encephalopathy 09/19/2016  . Schizoaffective disorder, chronic condition with acute exacerbation ([place])   . Schizophrenia, undifferentiated ([place]) 06/21/2014  . Delusional disorder ([place])   . Psychoses ([place])   . Gallbladder polyp 07/21/2011  . Liver mass 07/21/2011    Past Surgical History:  Procedure Laterality Date  . DIAGNOSTIC LAPAROSCOPY     pelvic mass Dr. Denman George 06-30-17  . LAPAROSCOPY N/A 06/30/2017   Procedure: LAPAROSCOPY DIAGNOSTIC WITH BIOPSIES;  Surgeon: Everitt Amber, MD;  Location: [facility] ORS;  Service: Gynecology;  Laterality: N/A;    Current Outpatient Rx  . Order #: [id number] Class: Normal  . Order #: [id number] Class: Historical Med  . Order #: [id number] Class: Historical Med  . Order #: [id number] Class: Historical Med  . Order #: [id number] Class: Historical Med  . Order #: [id number] Class: Historical Med  . Order #: [id number] Class: Historical Med  . Order #: [id number] Class: Print  . Order #: [id number] Class: Normal  . Order #: [id number] Class: Print  . Order #: [id number] Class: Print  . Order #: [id number] Class:  Historical Med  . Order #: [id number] Class: Normal  . Order #: [id number] Class: Historical Med  . Order #: [id number] Class: Normal  . Order #: [id number] Class: Normal  . Order #: [id number] Class: Normal  . Order #: [id number] Class: Historical Med  . Order #: [id number] Class: Historical Med  . Order #: [id number] Class: Historical Med  . Order #: [id number] Class: Historical Med  . Order #: [id number] Class: Historical Med  . Order #:  [id number] Class: Print  . Order #: [id number] Class: Historical Med    Allergies Penicillins, 5-alpha reductase inhibitors, and Percocet [oxycodone-acetaminophen]  Family History  Problem Relation Age of Onset  . Heart disease Mother   . Diabetes Sister     Social History Social History   Tobacco Use  . Smoking status: Current Every Day Smoker    Packs/day: 1.00    Years: 5.00    Pack years: 5.00    Types: Cigarettes  . Smokeless tobacco: Never Used  Substance Use Topics  . Alcohol use: Not Currently  . Drug use: Not Currently    Types: Marijuana    Review of Systems  All other systems negative except as documented in the HPI. All pertinent positives and negatives as reviewed in the HPI. ____________________________________________   PHYSICAL EXAM:  VITAL SIGNS: ED Triage Vitals  Enc Vitals Group     BP 04/27/19 1748 113/72     Pulse Rate 04/27/19 1748 80     Resp 04/27/19 1748 14     Temp 04/27/19 1748 98.4 F (36.9 C)     Temp Source 04/27/19 1748 Oral     SpO2 04/27/19 1748 99 %     Weight 04/27/19 1752 128 lb (58.1 kg)     Height 04/27/19 1752 5\' 5"  (1.651 m)    Constitutional: Alert and oriented. Well appearing and in no acute distress. Eyes: Conjunctivae are normal. PERRL. EOMI. Head: Atraumatic. Nose: No congestion/rhinnorhea. Mouth/Throat: Mucous membranes are moist.  Oropharynx non-erythematous. Neck: No stridor.  No meningeal signs.   Cardiovascular: Normal rate, regular rhythm. Good peripheral circulation. Grossly normal heart sounds.   Respiratory: Normal respiratory effort.  No retractions. Lungs CTAB. Gastrointestinal: Soft and nontender. No distention.  Musculoskeletal: No lower extremity tenderness but has 1+ nonpitting edema in left lower extremity. No gross deformities of extremities. Neurologic:  Normal speech and language. No gross focal neurologic deficits are appreciated.  Skin:  Skin is warm, dry and intact. No rash  noted.   ____________________________________________   LABS (all labs ordered are listed, but only abnormal results are displayed)  Labs Reviewed  BASIC METABOLIC PANEL - Abnormal; Notable for the following components:      Result Value   Glucose, Bld 110 (*)    BUN 7 (*)    All other components within normal limits  CBC - Abnormal; Notable for the following components:   Hemoglobin 11.0 (*)    HCT 35.2 (*)    RDW 15.7 (*)    Platelets 421 (*)    All other components within normal limits  TROPONIN I (HIGH SENSITIVITY)  TROPONIN I (HIGH SENSITIVITY)   ____________________________________________  EKG   EKG Interpretation  Date/Time:  Wednesday April 27 2019 17:49:26 EDT Ventricular Rate:  81 PR Interval:  170 QRS Duration: 74 QT Interval:  384 QTC Calculation: 446 R Axis:   31 Text Interpretation: Normal sinus rhythm Low voltage QRS Nonspecific T wave abnormality Abnormal ECG TWI in V2 and V3 appear new from february 4 Confirmed by Merrily Pew [phone]) on 04/27/2019 11:32:08 PM  ____________________________________________  RADIOLOGY  DG Chest 2 View  Result Date: 04/27/2019 CLINICAL DATA:  Chest pain EXAM: CHEST - 2 VIEW COMPARISON:  02/25/2019 FINDINGS: Diffuse interstitial opacities and thickening throughout the lungs, stable since prior study compatible with chronic interstitial lung disease. Heart is normal size. No acute confluent opacities. No effusions or acute bony abnormality. IMPRESSION: Chronic interstitial lung disease.  No active disease. Electronically Signed   By: Rolm Baptise M.D.   On: 04/27/2019 18:17   CT Angio Chest PE W and/or Wo Contrast  Result Date: 04/28/2019 CLINICAL DATA:  Elevated D-dimer with chest pain. EXAM: CT ANGIOGRAPHY CHEST WITH CONTRAST TECHNIQUE: Multidetector CT imaging of the chest was performed using the standard protocol during bolus administration of intravenous contrast. Multiplanar CT image reconstructions and MIPs  were obtained to evaluate the vascular anatomy. CONTRAST:  62mL OMNIPAQUE IOHEXOL 350 MG/ML SOLN COMPARISON:  April 15, 2019. FINDINGS: Cardiovascular: There is mild calcification of the aortic arch. Satisfactory opacification of the pulmonary arteries to the segmental level. No evidence of pulmonary embolism. Normal heart size. No pericardial effusion. Mediastinum/Nodes: There is mild right hilar lymphadenopathy. Lungs/Pleura: Mild emphysematous lung disease is seen involving the bilateral upper lobes. Mild, stable hazy areas of scarring and/or atelectasis are seen along the posterior aspect of the bilateral upper lobes. Very mild atelectasis and/or early infiltrate is seen within the anteromedial aspect of the right lower lobe. There is no evidence of a pleural effusion or pneumothorax. Upper Abdomen: A stable 5.2 cm x 1.9 cm well-defined area of fluid attenuation is seen along the posterolateral aspect of the right lobe of the liver. Musculoskeletal: No chest wall abnormality. No acute or significant osseous findings. Review of the MIP images confirms the above findings. IMPRESSION: 1. No evidence of pulmonary embolism. 2. Very mild right middle lobe atelectasis and/or early infiltrate. 3. Mild emphysematous lung disease. 4. Stable areas of scarring and/or atelectasis along the posterior aspect of the bilateral upper lobes. 5. Stable well-defined capsular implant seen along the posterolateral aspect of the right lobe of the liver. Aortic Atherosclerosis (ICD10-I70.0) and Emphysema (ICD10-J43.9). Electronically Signed   By: Virgina Norfolk M.D.   On: 04/28/2019 00:27    ____________________________________________   PROCEDURES  Procedure(s) performed:   Procedures   ____________________________________________   INITIAL IMPRESSION / ASSESSMENT AND PLAN / ED COURSE  We will evaluate for PE however find it unlikely at this time.  Otherwise patient can be discharged.  Her EKG is unremarkable with  unremarkable troponins.  Low risk for ACS.  No evidence of infection unless one is found CT scan.  Will also order repeat ultrasound as an outpatient for left lower extremity however this could just be related to her tumor burden.  Overall ct reassurring. Plan for outpatient follow up as scheduled. Will repeat US LLE to definitively rule out dvt. Otherwise PCP follow up.      Pertinent labs & imaging results that were available during my care of the patient were reviewed by me and considered in my medical decision making (see chart for details).  A medical screening exam was performed and I feel the patient has had an appropriate workup for their chief complaint at this time and likelihood of emergent condition existing is low. They have been counseled on decision, discharge, follow up and which symptoms necessitate immediate return to the emergency department. They or their family verbally stated understanding and agreement with plan and discharged in stable condition.   ____________________________________________  FINAL CLINICAL IMPRESSION(S) /  ED DIAGNOSES  Final diagnoses:  Nonspecific chest pain  Leg swelling     MEDICATIONS GIVEN DURING THIS VISIT:  Medications  iohexol (OMNIPAQUE) 350 MG/ML injection 75 mL (75 mLs Intravenous Contrast Given 04/27/19 2359)     NEW OUTPATIENT MEDICATIONS STARTED DURING THIS VISIT:  Discharge Medication List as of 04/28/2019 12:55 AM      Note:  This note was prepared with assistance of Dragon voice recognition software. Occasional wrong-word or sound-a-like substitutions may have occurred due to the inherent limitations of voice recognition software.   Rodricus Candelaria, Corene Cornea, MD 04/28/19 [phone]

## 2019-04-27 NOTE — ED Notes (Signed)
Pt called for labs, no answer x1

## 2019-04-27 NOTE — ED Triage Notes (Signed)
Patient arrived to ED accompanied by daughter. Reported was told by UC that patient has elevated D dimer. Daughter reported c/o of chest pain as well. Noted patient is on hospice services; daughter stated d/t Ovarian Cancer

## 2019-04-28 ENCOUNTER — Ambulatory Visit (HOSPITAL_BASED_OUTPATIENT_CLINIC_OR_DEPARTMENT_OTHER)
Admission: RE | Admit: 2019-04-28 | Discharge: 2019-04-28 | Disposition: A | Discharge status: Home / Self Care | Payer: Medicare Other | Source: Ambulatory Visit | Attending: Emergency Medicine | Admitting: Emergency Medicine

## 2019-04-28 DIAGNOSIS — M7989 Other specified soft tissue disorders: Secondary | ICD-10-CM | POA: Diagnosis not present

## 2019-04-28 DIAGNOSIS — R0789 Other chest pain: Secondary | ICD-10-CM | POA: Diagnosis not present

## 2019-04-28 LAB — TROPONIN I (HIGH SENSITIVITY): Troponin I (High Sensitivity): 3 ng/L (ref <18)

## 2019-04-28 NOTE — Progress Notes (Signed)
Left lower extremity venous duplex completed. Refer to "CV Proc" under chart review to view preliminary results.  04/28/2019 11:52 AM Kelby Aline., MHA, RVT, RDCS, RDMS

## 2019-06-12 ENCOUNTER — Other Ambulatory Visit: Payer: Self-pay

## 2019-06-12 ENCOUNTER — Emergency Department (HOSPITAL_COMMUNITY)
Admission: EM | Admit: 2019-06-12 | Discharge: 2019-06-17 | Disposition: A | Discharge status: Home / Self Care | Payer: Medicare Other | Attending: Emergency Medicine | Admitting: Emergency Medicine

## 2019-06-12 ENCOUNTER — Encounter (HOSPITAL_COMMUNITY): Payer: Self-pay | Admitting: Emergency Medicine

## 2019-06-12 ENCOUNTER — Ambulatory Visit (HOSPITAL_COMMUNITY)
Admission: AD | Admit: 2019-06-12 | Discharge: 2019-06-12 | Disposition: A | Discharge status: Home / Self Care | Payer: Medicare Other | Attending: Psychiatry | Admitting: Psychiatry

## 2019-06-12 DIAGNOSIS — R450 Nervousness: Secondary | ICD-10-CM | POA: Insufficient documentation

## 2019-06-12 DIAGNOSIS — R451 Restlessness and agitation: Secondary | ICD-10-CM | POA: Insufficient documentation

## 2019-06-12 DIAGNOSIS — Z20822 Contact with and (suspected) exposure to covid-19: Secondary | ICD-10-CM | POA: Insufficient documentation

## 2019-06-12 DIAGNOSIS — F17210 Nicotine dependence, cigarettes, uncomplicated: Secondary | ICD-10-CM | POA: Insufficient documentation

## 2019-06-12 DIAGNOSIS — F0391 Unspecified dementia with behavioral disturbance: Secondary | ICD-10-CM

## 2019-06-12 DIAGNOSIS — F209 Schizophrenia, unspecified: Secondary | ICD-10-CM | POA: Insufficient documentation

## 2019-06-12 DIAGNOSIS — F29 Unspecified psychosis not due to a substance or known physiological condition: Secondary | ICD-10-CM

## 2019-06-12 DIAGNOSIS — Z79899 Other long term (current) drug therapy: Secondary | ICD-10-CM | POA: Diagnosis not present

## 2019-06-12 DIAGNOSIS — F0390 Unspecified dementia without behavioral disturbance: Secondary | ICD-10-CM | POA: Diagnosis not present

## 2019-06-12 DIAGNOSIS — F203 Undifferentiated schizophrenia: Secondary | ICD-10-CM | POA: Insufficient documentation

## 2019-06-12 DIAGNOSIS — R2681 Unsteadiness on feet: Secondary | ICD-10-CM | POA: Insufficient documentation

## 2019-06-12 DIAGNOSIS — I10 Essential (primary) hypertension: Secondary | ICD-10-CM | POA: Insufficient documentation

## 2019-06-12 DIAGNOSIS — R2689 Other abnormalities of gait and mobility: Secondary | ICD-10-CM | POA: Diagnosis not present

## 2019-06-12 DIAGNOSIS — Z046 Encounter for general psychiatric examination, requested by authority: Secondary | ICD-10-CM | POA: Diagnosis present

## 2019-06-12 DIAGNOSIS — F419 Anxiety disorder, unspecified: Secondary | ICD-10-CM | POA: Insufficient documentation

## 2019-06-12 NOTE — ED Notes (Signed)
Pt agreed to give urine but not at this time.
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
## 2019-06-13 ENCOUNTER — Emergency Department (HOSPITAL_COMMUNITY): Payer: Medicare Other

## 2019-06-13 ENCOUNTER — Encounter (HOSPITAL_COMMUNITY): Payer: Self-pay | Admitting: Registered Nurse

## 2019-06-13 DIAGNOSIS — F203 Undifferentiated schizophrenia: Secondary | ICD-10-CM

## 2019-06-13 LAB — COMPREHENSIVE METABOLIC PANEL
ALT: 24 U/L (ref 0–44)
AST: 24 U/L (ref 15–41)
Albumin: 3.6 g/dL (ref 3.5–5.0)
Alkaline Phosphatase: 55 U/L (ref 38–126)
Anion gap: 10 (ref 5–15)
BUN: 18 mg/dL (ref 8–23)
CO2: 23 mmol/L (ref 22–32)
Calcium: 8.8 mg/dL — ABNORMAL LOW (ref 8.9–10.3)
Chloride: 109 mmol/L (ref 98–111)
Creatinine, Ser: 0.71 mg/dL (ref 0.44–1.00)
GFR calc Af Amer: >60 mL/min (ref >60)
GFR calc non Af Amer: >60 mL/min (ref >60)
Glucose, Bld: 145 mg/dL — ABNORMAL HIGH (ref 70–99)
Potassium: 3.9 mmol/L (ref 3.5–5.1)
Sodium: 142 mmol/L (ref 135–145)
Total Bilirubin: 0.4 mg/dL (ref 0.3–1.2)
Total Protein: 6.8 g/dL (ref 6.5–8.1)

## 2019-06-13 LAB — RAPID URINE DRUG SCREEN, HOSP PERFORMED
Amphetamines: NOT DETECTED
Barbiturates: NOT DETECTED
Benzodiazepines: POSITIVE — AB
Cocaine: NOT DETECTED
Opiates: NOT DETECTED
Tetrahydrocannabinol: NOT DETECTED

## 2019-06-13 LAB — URINALYSIS, ROUTINE W REFLEX MICROSCOPIC
Bilirubin Urine: NEGATIVE
Glucose, UA: NEGATIVE mg/dL
Hgb urine dipstick: NEGATIVE
Ketones, ur: NEGATIVE mg/dL
Nitrite: NEGATIVE
Protein, ur: NEGATIVE mg/dL
Specific Gravity, Urine: 1.025 (ref 1.005–1.030)
pH: 5 (ref 5.0–8.0)

## 2019-06-13 LAB — CBC
HCT: 40.1 % (ref 36.0–46.0)
Hemoglobin: 12.4 g/dL (ref 12.0–15.0)
MCH: 27.6 pg (ref 26.0–34.0)
MCHC: 30.9 g/dL (ref 30.0–36.0)
MCV: 89.3 fL (ref 80.0–100.0)
Platelets: 312 10*3/uL (ref 150–400)
RBC: 4.49 MIL/uL (ref 3.87–5.11)
RDW: 15.2 % (ref 11.5–15.5)
WBC: 8.1 10*3/uL (ref 4.0–10.5)
nRBC: 0 % (ref 0.0–0.2)

## 2019-06-13 LAB — ACETAMINOPHEN LEVEL: Acetaminophen (Tylenol), Serum: <10 ug/mL — ABNORMAL LOW (ref 10–30)

## 2019-06-13 LAB — ETHANOL: Alcohol, Ethyl (B): <10 mg/dL (ref <10)

## 2019-06-13 LAB — SARS CORONAVIRUS 2 (TAT 6-24 HRS): SARS Coronavirus 2: NEGATIVE

## 2019-06-13 LAB — SALICYLATE LEVEL: Salicylate Lvl: <7 mg/dL — ABNORMAL LOW (ref 7.0–30.0)

## 2019-06-13 MED ORDER — BUPROPION HCL 75 MG PO TABS
150.0000 mg | ORAL_TABLET | Freq: Every day | ORAL | Status: DC
  Administered 2019-06-13 – 2019-06-17 (×5): 150 mg via ORAL
  Filled 2019-06-13 (×5): qty 2

## 2019-06-13 MED ORDER — MIRTAZAPINE 7.5 MG PO TABS
15.0000 mg | ORAL_TABLET | Freq: Every day | ORAL | Status: DC
  Administered 2019-06-13 – 2019-06-16 (×5): 15 mg via ORAL
  Filled 2019-06-13 (×5): qty 2

## 2019-06-13 MED ORDER — MEMANTINE HCL 5 MG PO TABS
5.0000 mg | ORAL_TABLET | Freq: Two times a day (BID) | ORAL | Status: DC
  Administered 2019-06-13 – 2019-06-17 (×10): 5 mg via ORAL
  Filled 2019-06-13 (×11): qty 1

## 2019-06-13 MED ORDER — RISPERIDONE 0.5 MG PO TABS
0.2500 mg | ORAL_TABLET | Freq: Two times a day (BID) | ORAL | Status: DC | PRN
  Administered 2019-06-13: 0.25 mg via ORAL
  Filled 2019-06-13: qty 1

## 2019-06-13 MED ORDER — DIAZEPAM 5 MG PO TABS
5.0000 mg | ORAL_TABLET | Freq: Three times a day (TID) | ORAL | Status: DC | PRN
  Administered 2019-06-13 – 2019-06-15 (×3): 5 mg via ORAL
  Filled 2019-06-13 (×3): qty 1

## 2019-06-13 NOTE — Progress Notes (Signed)
Pt remains calm and cooperative. Alert and orientated x4 able to verbalize needs. Ambulates within the room and to the restroom as needed. Gait steady, no complaints or concerns voiced. Will continue to monitor.

## 2019-06-13 NOTE — Consult Note (Signed)
[facility] Psych ED Progress Note  06/13/2019 3:45 PM Kristen Colon  MRN:  [id number]   Subjective:  ""I didn't know I had a baby; it wasn't with me it was with the father."  Kristen Colon, 69 y.o., female patient seen via tele psych by this provider, Dr. Dwyane Dee; and chart reviewed on 06/13/19.  On evaluation Kristen Colon reports she is fine and doesn't know why she is in the hospital.  States that she has a roommate who has something against her.  States she is not sleeping well in the hospital related to "Strange people coming in and I don't feel comfortable with strange people around."  Patient states that she is having pain on her left side/back. During evaluation [name] is alert/oriented x 4; calm/cooperative; and mood is congruent with affect.  She does not appear to be responding to internal/external stimuli; but delusional statements about her roommate. Patient denies suicidal/self-harm/homicidal ideation.     Principal Problem: Schizophrenia, undifferentiated ([place]) Diagnosis:  Principal Problem:   Schizophrenia, undifferentiated ([place])  Total Time spent with patient: 30 minutes  Past Psychiatric History: Schizophrenia  Past Medical History:  Past Medical History:  Diagnosis Date  . Anxiety   . Dementia ([place])   . Dyspnea   . Headache   . Hypertension   . Schizophrenia ([place])    paranoid    Past Surgical History:  Procedure Laterality Date  . DIAGNOSTIC LAPAROSCOPY     pelvic mass Dr. Denman George 06-30-17  . LAPAROSCOPY N/A 06/30/2017   Procedure: LAPAROSCOPY DIAGNOSTIC WITH BIOPSIES;  Surgeon: Everitt Amber, MD;  Location: [facility] ORS;  Service: Gynecology;  Laterality: N/A;   Family History:  Family History  Problem Relation Age of Onset  . Heart disease Mother   . Diabetes Sister    Family Psychiatric  History: Unaware Social History:  Social History   Substance and Sexual Activity  Alcohol Use Not Currently     Social History   Substance and Sexual Activity  Drug  Use Not Currently  . Types: Marijuana    Social History   Socioeconomic History  . Marital status: Divorced    Spouse name: Not on file  . Number of children: Not on file  . Years of education: Not on file  . Highest education level: Not on file  Occupational History  . Not on file  Tobacco Use  . Smoking status: Current Every Day Smoker    Packs/day: 1.00    Years: 5.00    Pack years: 5.00    Types: Cigarettes  . Smokeless tobacco: Never Used  Substance and Sexual Activity  . Alcohol use: Not Currently  . Drug use: Not Currently    Types: Marijuana  . Sexual activity: Not Currently    Comment: quit 5 years ago  Other Topics Concern  . Not on file  Social History Narrative  . Not on file   Social Determinants of Health   Financial Resource Strain:   . Difficulty of Paying Living Expenses:   Food Insecurity:   . Worried About Charity fundraiser in the Last Year:   . Arboriculturist in the Last Year:   Transportation Needs:   . Film/video editor (Medical):   Marland Kitchen Lack of Transportation (Non-Medical):   Physical Activity:   . Days of Exercise per Week:   . Minutes of Exercise per Session:   Stress:   . Feeling of Stress :   Social Connections:   .  Frequency of Communication with Friends and Family:   . Frequency of Social Gatherings with Friends and Family:   . Attends Religious Services:   . Active Member of Clubs or Organizations:   . Attends Archivist Meetings:   Marland Kitchen Marital Status:     Sleep: Poor  Appetite:  Fair  Current Medications: Current Facility-Administered Medications  Medication Dose Route Frequency Provider Last Rate Last Admin  . buPROPion [facility]) tablet 150 mg  150 mg Oral Daily Ripley Fraise, MD   150 mg at 06/13/19 0814  . diazepam (VALIUM) tablet 5 mg  5 mg Oral Q8H PRN Ripley Fraise, MD   5 mg at 06/13/19 0157  . memantine (NAMENDA) tablet 5 mg  5 mg Oral BID Ripley Fraise, MD   5 mg at 06/13/19 0814  .  mirtazapine (REMERON) tablet 15 mg  15 mg Oral QHS Ripley Fraise, MD   15 mg at 06/13/19 0156  . risperiDONE (RISPERDAL) tablet 0.25 mg  0.25 mg Oral BID PRN Ripley Fraise, MD   0.25 mg at 06/13/19 0158   Current Outpatient Medications  Medication Sig Dispense Refill  . acetaminophen (TYLENOL) 325 MG tablet Take 2 tablets (650 mg total) by mouth every 8 (eight) hours as needed for mild pain or moderate pain. 30 tablet 0  . amLODipine (NORVASC) 5 MG tablet Take 5 mg by mouth daily.    . Ascorbic Acid (VITAMIN C) 500 MG CAPS Take 1,000 mg by mouth daily.     . baclofen (LIORESAL) 10 MG tablet Take 10 mg by mouth daily.     . benztropine (COGENTIN) 0.5 MG tablet Take 0.5 mg by mouth daily.    Marland Kitchen buPROPion (WELLBUTRIN XL) 150 MG 24 hr tablet Take 150 mg by mouth daily.    . Cholecalciferol (VITAMIN D) 50 MCG (2000 UT) tablet Take 2,000 Units by mouth daily.    . cyclobenzaprine (FLEXERIL) 5 MG tablet Take 5 mg by mouth at bedtime.     . diazepam (VALIUM) 5 MG tablet Take 5 mg by mouth every 8 (eight) hours as needed for anxiety.    . fentaNYL (DURAGESIC) 50 MCG/HR Place 1 patch onto the skin every 3 (three) days. 2 patch 0  . glipiZIDE (GLUCOTROL) 5 MG tablet Take 1 tablet (5 mg total) by mouth 2 (two) times daily. 60 tablet 0  . HYDROcodone-acetaminophen (NORCO/VICODIN) 5-325 MG tablet Take 1 tablet by mouth every 6 (six) hours as needed for moderate pain. 5 tablet 0  . INVEGA SUSTENNA 156 MG/ML SUSP injection Inject 1 mL (156 mg total) into the muscle every 30 (thirty) days. 0.9 mL 0  . linaclotide (LINZESS) 290 MCG CAPS capsule Take 1 capsule (290 mcg total) by mouth daily. 30 capsule 0  . lisinopril (ZESTRIL) 10 MG tablet Take 10 mg by mouth 2 (two) times daily.    Marland Kitchen lubiprostone (AMITIZA) 24 MCG capsule Take 24 mcg by mouth daily.    . memantine (NAMENDA) 5 MG tablet Take 1 tablet (5 mg total) by mouth 2 (two) times daily. 60 tablet 0  . metoprolol tartrate (LOPRESSOR) 25 MG tablet Take  0.5 tablets (12.5 mg total) by mouth 2 (two) times daily. 30 tablet 0  . mirtazapine (REMERON) 15 MG tablet Take 1 tablet (15 mg total) by mouth at bedtime. 30 tablet 0  . Multiple Vitamin (MULTIVITAMIN WITH MINERALS) TABS tablet Take 1 tablet by mouth daily.    . ondansetron (ZOFRAN) 4 MG tablet Take 4 mg by  mouth daily at 6 (six) AM. Per daughter takes every morning with morning meds due to meds making her vomit.    . pantoprazole (PROTONIX) 40 MG tablet Take 40 mg by mouth daily.    . QUEtiapine (SEROQUEL) 100 MG tablet Take 100 mg by mouth every 12 (twelve) hours as needed (sleep/agitation).     . risperiDONE (RISPERDAL) 0.25 MG tablet Take 0.25 mg by mouth 2 (two) times daily as needed (agitation).     . thiamine 100 MG tablet Take 1 tablet (100 mg total) by mouth daily. 30 tablet 3  . zinc sulfate 220 (50 Zn) MG capsule Take 220 mg by mouth daily.      Lab Results:  Results for orders placed or performed during the hospital encounter of 06/12/19 (from the past 48 hour(s))  Rapid urine drug screen (hospital performed)     Status: Abnormal   Collection Time: 06/13/19 12:08 AM  Result Value Ref Range   Opiates NONE DETECTED NONE DETECTED   Cocaine NONE DETECTED NONE DETECTED   Benzodiazepines POSITIVE (A) NONE DETECTED   Amphetamines NONE DETECTED NONE DETECTED   Tetrahydrocannabinol NONE DETECTED NONE DETECTED   Barbiturates NONE DETECTED NONE DETECTED    Comment: (NOTE) DRUG SCREEN FOR MEDICAL PURPOSES ONLY.  IF CONFIRMATION IS NEEDED FOR ANY PURPOSE, NOTIFY LAB WITHIN 5 DAYS. LOWEST DETECTABLE LIMITS FOR URINE DRUG SCREEN Drug Class                     Cutoff (ng/mL) Amphetamine and metabolites    1000 Barbiturate and metabolites    200 Benzodiazepine                 [id number] Tricyclics and metabolites     300 Opiates and metabolites        300 Cocaine and metabolites        300 THC                            50 Performed at [facility], [place] [address], [place], [place] [address]   Urinalysis, Routine w reflex microscopic     Status: Abnormal   Collection Time: 06/13/19 12:45 AM  Result Value Ref Range   Color, Urine YELLOW YELLOW   APPearance CLEAR CLEAR   Specific Gravity, Urine 1.025 1.005 - 1.030   pH 5.0 5.0 - 8.0   Glucose, UA NEGATIVE NEGATIVE mg/dL   Hgb urine dipstick NEGATIVE NEGATIVE   Bilirubin Urine NEGATIVE NEGATIVE   Ketones, ur NEGATIVE NEGATIVE mg/dL   Protein, ur NEGATIVE NEGATIVE mg/dL   Nitrite NEGATIVE NEGATIVE   Leukocytes,Ua TRACE (A) NEGATIVE   RBC / HPF 0-5 0 - 5 RBC/hpf   WBC, UA 6-10 0 - 5 WBC/hpf   Bacteria, UA RARE (A) NONE SEEN   Squamous Epithelial / LPF 0-5 0 - 5   Mucus PRESENT    Ca Oxalate Crys, UA PRESENT     Comment: Performed at [facility], [place] [address], [place], [place] [address]  Comprehensive metabolic panel     Status: Abnormal   Collection Time: 06/13/19  2:41 AM  Result Value Ref Range   Sodium 142 135 - 145 mmol/L   Potassium 3.9 3.5 - 5.1 mmol/L   Chloride 109 98 - 111 mmol/L   CO2 23 22 - 32 mmol/L   Glucose, Bld 145 (H) 70 - 99 mg/dL    Comment: Glucose  reference range applies only to samples taken after fasting for at least 8 hours.   BUN 18 8 - 23 mg/dL   Creatinine, Ser 0.71 0.44 - 1.00 mg/dL   Calcium 8.8 (L) 8.9 - 10.3 mg/dL   Total Protein 6.8 6.5 - 8.1 g/dL   Albumin 3.6 3.5 - 5.0 g/dL   AST 24 15 - 41 U/L   ALT 24 0 - 44 U/L   Alkaline Phosphatase 55 38 - 126 U/L   Total Bilirubin 0.4 0.3 - 1.2 mg/dL   GFR calc non Af Amer >60 >60 mL/min   GFR calc Af Amer >60 >60 mL/min   Anion gap 10 5 - 15    Comment: Performed at [facility], [place] [address] [place], [place] [address]  cbc     Status: None   Collection Time: 06/13/19  2:41 AM  Result Value Ref Range   WBC 8.1 4.0 - 10.5 K/uL   RBC 4.49 3.87 - 5.11 MIL/uL   Hemoglobin 12.4 12.0 - 15.0 g/dL   HCT 40.1 36.0 - 46.0 %   MCV 89.3 80.0 - 100.0 fL   MCH 27.6 26.0 -  34.0 pg   MCHC 30.9 30.0 - 36.0 g/dL   RDW 15.2 11.5 - 15.5 %   Platelets 312 150 - 400 K/uL   nRBC 0.0 0.0 - 0.2 %    Comment: Performed at [facility], [place] [address] [place], [place] [address]  Ethanol     Status: None   Collection Time: 06/13/19  2:42 AM  Result Value Ref Range   Alcohol, Ethyl (B) <10 <10 mg/dL    Comment: (NOTE) Lowest detectable limit for serum alcohol is 10 mg/dL. For medical purposes only. Performed at [facility], [place] [address] [place], [place] [id number]   Salicylate level     Status: Abnormal   Collection Time: 06/13/19  2:42 AM  Result Value Ref Range   Salicylate Lvl [id number] (L) 7.0 - 30.0 mg/dL    Comment: Performed at [facility], [place] [address], [place], [place] [address]  Acetaminophen level     Status: Abnormal   Collection Time: 06/13/19  2:42 AM  Result Value Ref Range   Acetaminophen (Tylenol), Serum <10 (L) 10 - 30 ug/mL    Comment: (NOTE) Therapeutic concentrations vary significantly. A range of 10-30 ug/mL  may be an effective concentration for many patients. However, some  are best treated at concentrations outside of this range. Acetaminophen concentrations >150 ug/mL at 4 hours after ingestion  and >50 ug/mL at 12 hours after ingestion are often associated with  toxic reactions. Performed at [facility], [place] [address], [place], [place] [address]   SARS CORONAVIRUS 2 (TAT 6-24 HRS) Nasopharyngeal Nasopharyngeal Swab     Status: None   Collection Time: 06/13/19  2:42 AM   Specimen: Nasopharyngeal Swab  Result Value Ref Range   SARS Coronavirus 2 NEGATIVE NEGATIVE    Comment: (NOTE) SARS-CoV-2 target nucleic acids are NOT DETECTED. The SARS-CoV-2 RNA is generally detectable in upper and lower respiratory specimens during the acute phase of infection. Negative results do not preclude SARS-CoV-2 infection, do not rule out co-infections with other  pathogens, and should not be used as the sole basis for treatment or other patient management decisions. Negative results must be combined with clinical observations, patient history, and epidemiological information. The expected result is Negative. Fact Sheet for Patients: SugarRoll.be Fact Sheet for  Healthcare Providers: https://www.woods-mathews.com/ This test is not yet approved or cleared by the [place] and  has been authorized for detection and/or diagnosis of SARS-CoV-2 by FDA under an Emergency Use Authorization (EUA). This EUA will remain  in effect (meaning this test can be used) for the duration of the COVID-19 declaration under Section 56 4(b)(1) of the Act, 21 U.S.C. section 360bbb-3(b)(1), unless the authorization is terminated or revoked sooner. Performed at [facility], [place] [address] [place], [place] [address]     Blood Alcohol level:  Lab Results  Component Value Date   ETH <10 06/13/2019   ETH <10 05/20/2017    Physical Findings: AIMS:  , ,  ,  ,    CIWA:    COWS:     Musculoskeletal: Strength & Muscle Tone: Unable to assess via telepsych Gait & Station: Did not see ambulate Patient leans: N/A  Psychiatric Specialty Exam: Physical Exam Vitals and nursing note reviewed. Exam conducted with a chaperone present.  Pulmonary:     Effort: Pulmonary effort is normal.  Neurological:     Mental Status: She is alert.  Psychiatric:        Attention and Perception: Attention normal.        Mood and Affect: Mood is anxious and depressed.        Speech: Speech normal.        Behavior: Behavior is cooperative.        Thought Content: Thought content is paranoid and delusional.        Cognition and Memory: Cognition normal.        Judgment: Judgment is impulsive.     Review of Systems  Psychiatric/Behavioral: Negative for suicidal ideas. Hallucinations: Denies.       Patient denies  suicidal/self-harm/homicidal ideation, psychosis, and delusion.  But, appears to be having paranoid delusions related to her roommate stating "living with a female who had a problem with me and not tell me about it but had evidence to show my involvement."  All other systems reviewed and are negative.   Blood pressure (!) 167/78, pulse 72, temperature 97.6 F (36.4 C), temperature source Oral, resp. rate 20, height 5\' 2"  (1.575 m), weight 49.4 kg, SpO2 95 %.Body mass index is 19.92 kg/m.  General Appearance: Casual  Eye Contact:  Good  Speech:  Clear and Coherent and Normal Rate  Volume:  Normal  Mood:  Anxious  Affect:  Labile  Thought Process:  Coherent and Disorganized  Orientation:  Other:  Oriented to self and place  Thought Content:  Delusions and Paranoid Ideation  Suicidal Thoughts:  No  Homicidal Thoughts:  No  Memory:  Immediate;   Fair Recent;   Fair  Judgement:  Impaired  Insight:  Lacking  Psychomotor Activity:  Normal  Concentration:  Concentration: Fair and Attention Span: Fair  Recall:  Poor  Fund of Knowledge:  Fair  Language:  Fair  Akathisia:  No  Handed:  Right  AIMS (if indicated):     Assets:  Communication Skills Desire for Improvement Housing Social Support  ADL's:  Intact  Cognition:  WNL  Sleep:      Treatment Plan Summary: Daily contact with patient to assess and evaluate symptoms and progress in treatment, Medication management and Plan Inpatient psychiatric treatment.  Will continue to see [facility] psychiatric inpatient treatment  Continue Risperdal 0.25 mg bid for agitation Remeron 15 mg Q hs Valium 5 mg Q 8 hrs anxiety  Daley Gosse, NP  06/13/2019, 3:45 PM

## 2019-06-13 NOTE — BH Assessment (Signed)
[facility] Assessment Progress Note  Per Shuvon Rankin, FNP, this pt requires psychiatric hospitalization at this time.  Pt presents under IVC initiated by pt's daughter and upheld by EDP Ripley Fraise, MD.  The following facilities have been contacted to seek placement for this pt, with results as noted:  Beds available, information sent, decision pending: [facility]  Unable to reach: [facility] (left message at 14:05) [facility] (left message at 14:10) [facility] (left message at 14:11)  Not referred: [facility] (dementia is exclusionary) [facility] (dementia is exclusionary) [facility] (dementia is exclusionary) [facility] (dementia is exclusionary)  At capacity: [facility] (geriatric currently under Covid-19 quarantine) [facility] (geriatric unit currently closed) [facility] (geriatric unit currently closed)   Jalene Mullet, [place] Coordinator [phone]

## 2019-06-13 NOTE — H&P (Signed)
Behavioral Health Medical Screening Exam  Kristen Colon is an 69 y.o. female.  Total Time spent with patient: 15 minutes  Psychiatric Specialty Exam: Physical Exam  Constitutional: She is oriented to person, place, and time. She appears well-developed and well-nourished. No distress.  HENT:  Head: Normocephalic.  Eyes: Pupils are equal, round, and reactive to light. Right eye exhibits no discharge. Left eye exhibits no discharge.  Cardiovascular: Normal rate.  Respiratory: Effort normal. No respiratory distress.  Musculoskeletal:        General: Normal range of motion.  Neurological: She is alert and oriented to person, place, and time.  Skin: She is not diaphoretic.  Psychiatric: Her mood appears anxious. Thought content is delusional. Thought content is not paranoid. She exhibits a depressed mood. She expresses no homicidal and no suicidal ideation.    Review of Systems  Constitutional: Negative for activity change, appetite change, chills, diaphoresis, fatigue, fever and unexpected weight change.  Respiratory: Negative for cough and shortness of breath.   Cardiovascular: Negative for chest pain and palpitations.  Gastrointestinal: Negative for diarrhea, nausea and vomiting.  Psychiatric/Behavioral: Positive for agitation, confusion, decreased concentration, dysphoric mood, hallucinations and sleep disturbance. Negative for self-injury and suicidal ideas. The patient is nervous/anxious.   All other systems reviewed and are negative.   Blood pressure (!) 146/86, pulse 98, temperature 98.1 F (36.7 C), resp. rate 16, SpO2 98 %.There is no height or weight on file to calculate BMI.  General Appearance: Fairly Groomed  Eye Contact:  Fair  Speech:  Clear and Coherent and Normal Rate  Volume:  Normal  Mood:  Anxious and Depressed  Affect:  Congruent  Thought Process:  Coherent  Orientation:  Full (Time, Place, and Person)  Thought Content:  Hallucinations: Auditory Visual   Suicidal Thoughts:  No  Homicidal Thoughts:  No  Memory:  Immediate;   Fair Recent;   Fair Remote;   Fair  Judgement:  Intact  Insight:  Lacking  Psychomotor Activity:  Normal  Concentration: Concentration: Fair and Attention Span: Fair  Recall:  [employer] of Knowledge:Fair  Language: Good  Akathisia:  Negative  Handed:  Right  AIMS (if indicated):     Assets:  Financial Resources/Insurance Housing Leisure Time Physical Health  Sleep:       Musculoskeletal: Strength & Muscle Tone: within normal limits Gait & Station: normal Patient leans: N/A  Blood pressure (!) 146/86, pulse 98, temperature 98.1 F (36.7 C), resp. rate 16, SpO2 98 %.  Recommendations:  Based on my evaluation the patient does not appear to have an emergency medical condition.  Rozetta Nunnery, NP 06/13/2019, 12:42 AM
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
## 2019-06-14 DIAGNOSIS — F203 Undifferentiated schizophrenia: Secondary | ICD-10-CM | POA: Diagnosis not present

## 2019-06-14 NOTE — Consult Note (Signed)
[facility] Psych ED Progress Note  06/14/2019 2:04 PM Kristen Colon  MRN:  [id number]   Subjective: "I am feeling a little better today." Patient reports that she is doing ok. She states that she slept better last night. She denies any paranoia, denies any suicidal or homicidal ideations, denies any hallucinations. She states that she lives with her daughter and that they have some arguments sometimes. She denies talking to pictures, today and states that she was only talking about the pictures, not talking to them.   Kristen Colon, 69 y.o., female presents in her bed lying down but sits up for the evaluation. She presents with a flat affect but is not making any bizarre comments and shows no signs of psychosis. She is pleasant and cooperative. There have been no complaints about the patient in the ED today. Her daughter was requested to see the patient, but she stated that she would speak to her on the phone. The patient seems to show significant improvement today and feel that she is stabilizing to the point of discharge by tomorrow possibly. Will wait till daughter speaks to her for determination. At this time we will continue to seek inpatient treatment for the patient.   06/13/19: Patient seen via tele psych by this provider, Dr. Dwyane Dee; and chart reviewed on 06/14/19.  On evaluation Kristen Colon reports she is fine and doesn't know why she is in the hospital.  States that she has a roommate who has something against her.  States she is not sleeping well in the hospital related to "Strange people coming in and I don't feel comfortable with strange people around."  Patient states that she is having pain on her left side/back. During evaluation [name] is alert/oriented x 4; calm/cooperative; and mood is congruent with affect.  She does not appear to be responding to internal/external stimuli; but delusional statements about her roommate. Patient denies suicidal/self-harm/homicidal ideation.      Principal Problem: Schizophrenia, undifferentiated ([place]) Diagnosis:  Principal Problem:   Schizophrenia, undifferentiated ([place])  Total Time spent with patient: 30 minutes  Past Psychiatric History: Schizophrenia  Past Medical History:  Past Medical History:  Diagnosis Date  . Anxiety   . Dementia ([place])   . Dyspnea   . Headache   . Hypertension   . Schizophrenia ([place])    paranoid    Past Surgical History:  Procedure Laterality Date  . DIAGNOSTIC LAPAROSCOPY     pelvic mass Dr. Denman George 06-30-17  . LAPAROSCOPY N/A 06/30/2017   Procedure: LAPAROSCOPY DIAGNOSTIC WITH BIOPSIES;  Surgeon: Everitt Amber, MD;  Location: [facility];  Service: Gynecology;  Laterality: N/A;   Family History:  Family History  Problem Relation Age of Onset  . Heart disease Mother   . Diabetes Sister    Family Psychiatric  History: Unaware Social History:  Social History   Substance and Sexual Activity  Alcohol Use Not Currently     Social History   Substance and Sexual Activity  Drug Use Not Currently  . Types: Marijuana    Social History   Socioeconomic History  . Marital status: Divorced    Spouse name: Not on file  . Number of children: Not on file  . Years of education: Not on file  . Highest education level: Not on file  Occupational History  . Not on file  Tobacco Use  . Smoking status: Current Every Day Smoker    Packs/day: 1.00    Years: 5.00  Pack years: 5.00    Types: Cigarettes  . Smokeless tobacco: Never Used  Substance and Sexual Activity  . Alcohol use: Not Currently  . Drug use: Not Currently    Types: Marijuana  . Sexual activity: Not Currently    Comment: quit 5 years ago  Other Topics Concern  . Not on file  Social History Narrative  . Not on file   Social Determinants of Health   Financial Resource Strain:   . Difficulty of Paying Living Expenses:   Food Insecurity:   . Worried About Charity fundraiser in the Last Year:   . Arboriculturist in the Last  Year:   Transportation Needs:   . Film/video editor (Medical):   Marland Kitchen Lack of Transportation (Non-Medical):   Physical Activity:   . Days of Exercise per Week:   . Minutes of Exercise per Session:   Stress:   . Feeling of Stress :   Social Connections:   . Frequency of Communication with Friends and Family:   . Frequency of Social Gatherings with Friends and Family:   . Attends Religious Services:   . Active Member of Clubs or Organizations:   . Attends Archivist Meetings:   Marland Kitchen Marital Status:     Sleep: Poor  Appetite:  Fair  Current Medications: Current Facility-Administered Medications  Medication Dose Route Frequency Provider Last Rate Last Admin  . buPROPion [facility]) tablet 150 mg  150 mg Oral Daily Ripley Fraise, MD   150 mg at 06/14/19 0831  . diazepam (VALIUM) tablet 5 mg  5 mg Oral Q8H PRN Ripley Fraise, MD   5 mg at 06/13/19 2102  . memantine (NAMENDA) tablet 5 mg  5 mg Oral BID Ripley Fraise, MD   5 mg at 06/14/19 0831  . mirtazapine (REMERON) tablet 15 mg  15 mg Oral QHS Ripley Fraise, MD   15 mg at 06/13/19 2252  . risperiDONE (RISPERDAL) tablet 0.25 mg  0.25 mg Oral BID PRN Ripley Fraise, MD   0.25 mg at 06/13/19 0158   Current Outpatient Medications  Medication Sig Dispense Refill  . acetaminophen (TYLENOL) 325 MG tablet Take 2 tablets (650 mg total) by mouth every 8 (eight) hours as needed for mild pain or moderate pain. 30 tablet 0  . amLODipine (NORVASC) 5 MG tablet Take 5 mg by mouth daily.    . Ascorbic Acid (VITAMIN C) 500 MG CAPS Take 1,000 mg by mouth daily.     . baclofen (LIORESAL) 10 MG tablet Take 10 mg by mouth daily.     . benztropine (COGENTIN) 0.5 MG tablet Take 0.5 mg by mouth daily.    Marland Kitchen buPROPion (WELLBUTRIN XL) 150 MG 24 hr tablet Take 150 mg by mouth daily.    . Cholecalciferol (VITAMIN D) 50 MCG (2000 UT) tablet Take 2,000 Units by mouth daily.    . cyclobenzaprine (FLEXERIL) 5 MG tablet Take 5 mg by mouth at  bedtime.     . diazepam (VALIUM) 5 MG tablet Take 5 mg by mouth every 8 (eight) hours as needed for anxiety.    . fentaNYL (DURAGESIC) 50 MCG/HR Place 1 patch onto the skin every 3 (three) days. 2 patch 0  . glipiZIDE (GLUCOTROL) 5 MG tablet Take 1 tablet (5 mg total) by mouth 2 (two) times daily. 60 tablet 0  . HYDROcodone-acetaminophen (NORCO/VICODIN) 5-325 MG tablet Take 1 tablet by mouth every 6 (six) hours as needed for moderate pain. 5 tablet 0  .  INVEGA SUSTENNA 156 MG/ML SUSP injection Inject 1 mL (156 mg total) into the muscle every 30 (thirty) days. 0.9 mL 0  . linaclotide (LINZESS) 290 MCG CAPS capsule Take 1 capsule (290 mcg total) by mouth daily. 30 capsule 0  . lisinopril (ZESTRIL) 10 MG tablet Take 10 mg by mouth 2 (two) times daily.    Marland Kitchen lubiprostone (AMITIZA) 24 MCG capsule Take 24 mcg by mouth daily.    . memantine (NAMENDA) 5 MG tablet Take 1 tablet (5 mg total) by mouth 2 (two) times daily. 60 tablet 0  . metoprolol tartrate (LOPRESSOR) 25 MG tablet Take 0.5 tablets (12.5 mg total) by mouth 2 (two) times daily. 30 tablet 0  . mirtazapine (REMERON) 15 MG tablet Take 1 tablet (15 mg total) by mouth at bedtime. 30 tablet 0  . Multiple Vitamin (MULTIVITAMIN WITH MINERALS) TABS tablet Take 1 tablet by mouth daily.    . ondansetron (ZOFRAN) 4 MG tablet Take 4 mg by mouth daily at 6 (six) AM. Per daughter takes every morning with morning meds due to meds making her vomit.    . pantoprazole (PROTONIX) 40 MG tablet Take 40 mg by mouth daily.    . QUEtiapine (SEROQUEL) 100 MG tablet Take 100 mg by mouth every 12 (twelve) hours as needed (sleep/agitation).     . risperiDONE (RISPERDAL) 0.25 MG tablet Take 0.25 mg by mouth 2 (two) times daily as needed (agitation).     . thiamine 100 MG tablet Take 1 tablet (100 mg total) by mouth daily. 30 tablet 3  . zinc sulfate 220 (50 Zn) MG capsule Take 220 mg by mouth daily.      Lab Results:  Results for orders placed or performed during the  hospital encounter of 06/12/19 (from the past 48 hour(s))  Rapid urine drug screen (hospital performed)     Status: Abnormal   Collection Time: 06/13/19 12:08 AM  Result Value Ref Range   Opiates NONE DETECTED NONE DETECTED   Cocaine NONE DETECTED NONE DETECTED   Benzodiazepines POSITIVE (A) NONE DETECTED   Amphetamines NONE DETECTED NONE DETECTED   Tetrahydrocannabinol NONE DETECTED NONE DETECTED   Barbiturates NONE DETECTED NONE DETECTED    Comment: (NOTE) DRUG SCREEN FOR MEDICAL PURPOSES ONLY.  IF CONFIRMATION IS NEEDED FOR ANY PURPOSE, NOTIFY LAB WITHIN 5 DAYS. LOWEST DETECTABLE LIMITS FOR URINE DRUG SCREEN Drug Class                     Cutoff (ng/mL) Amphetamine and metabolites    1000 Barbiturate and metabolites    200 Benzodiazepine                 [id number] Tricyclics and metabolites     300 Opiates and metabolites        300 Cocaine and metabolites        300 THC                            50 Performed at [facility], [place] [address] [place], [place] [address]   Urinalysis, Routine w reflex microscopic     Status: Abnormal   Collection Time: 06/13/19 12:45 AM  Result Value Ref Range   Color, Urine YELLOW YELLOW   APPearance CLEAR CLEAR   Specific Gravity, Urine 1.025 1.005 - 1.030   pH 5.0 5.0 - 8.0   Glucose, UA NEGATIVE NEGATIVE mg/dL   Hgb urine dipstick NEGATIVE NEGATIVE  Bilirubin Urine NEGATIVE NEGATIVE   Ketones, ur NEGATIVE NEGATIVE mg/dL   Protein, ur NEGATIVE NEGATIVE mg/dL   Nitrite NEGATIVE NEGATIVE   Leukocytes,Ua TRACE (A) NEGATIVE   RBC / HPF 0-5 0 - 5 RBC/hpf   WBC, UA 6-10 0 - 5 WBC/hpf   Bacteria, UA RARE (A) NONE SEEN   Squamous Epithelial / LPF 0-5 0 - 5   Mucus PRESENT    Ca Oxalate Crys, UA PRESENT     Comment: Performed at [facility], [place] [address], [place], [place] [address]  Comprehensive metabolic panel     Status: Abnormal   Collection Time: 06/13/19  2:41 AM  Result Value Ref Range   Sodium  142 135 - 145 mmol/L   Potassium 3.9 3.5 - 5.1 mmol/L   Chloride 109 98 - 111 mmol/L   CO2 23 22 - 32 mmol/L   Glucose, Bld 145 (H) 70 - 99 mg/dL    Comment: Glucose reference range applies only to samples taken after fasting for at least 8 hours.   BUN 18 8 - 23 mg/dL   Creatinine, Ser 0.71 0.44 - 1.00 mg/dL   Calcium 8.8 (L) 8.9 - 10.3 mg/dL   Total Protein 6.8 6.5 - 8.1 g/dL   Albumin 3.6 3.5 - 5.0 g/dL   AST 24 15 - 41 U/L   ALT 24 0 - 44 U/L   Alkaline Phosphatase 55 38 - 126 U/L   Total Bilirubin 0.4 0.3 - 1.2 mg/dL   GFR calc non Af Amer >60 >60 mL/min   GFR calc Af Amer >60 >60 mL/min   Anion gap 10 5 - 15    Comment: Performed at [facility], [place] [address], [place], [place] [address]  cbc     Status: None   Collection Time: 06/13/19  2:41 AM  Result Value Ref Range   WBC 8.1 4.0 - 10.5 K/uL   RBC 4.49 3.87 - 5.11 MIL/uL   Hemoglobin 12.4 12.0 - 15.0 g/dL   HCT 40.1 36.0 - 46.0 %   MCV 89.3 80.0 - 100.0 fL   MCH 27.6 26.0 - 34.0 pg   MCHC 30.9 30.0 - 36.0 g/dL   RDW 15.2 11.5 - 15.5 %   Platelets 312 150 - 400 K/uL   nRBC 0.0 0.0 - 0.2 %    Comment: Performed at [facility], [place] [address] [place], [place] [address]  Ethanol     Status: None   Collection Time: 06/13/19  2:42 AM  Result Value Ref Range   Alcohol, Ethyl (B) <10 <10 mg/dL    Comment: (NOTE) Lowest detectable limit for serum alcohol is 10 mg/dL. For medical purposes only. Performed at [facility], [place] [address] [place], [place] [id number]   Salicylate level     Status: Abnormal   Collection Time: 06/13/19  2:42 AM  Result Value Ref Range   Salicylate Lvl [id number] (L) 7.0 - 30.0 mg/dL    Comment: Performed at [facility], [place] [address] [place], [place] [address]  Acetaminophen level     Status: Abnormal   Collection Time: 06/13/19  2:42 AM  Result Value Ref Range   Acetaminophen (Tylenol), Serum <10 (L) 10 - 30 ug/mL     Comment: (NOTE) Therapeutic concentrations vary significantly. A range of 10-30 ug/mL  may be an effective concentration for many patients. However, some  are best treated at concentrations outside of this range. Acetaminophen  concentrations >150 ug/mL at 4 hours after ingestion  and >50 ug/mL at 12 hours after ingestion are often associated with  toxic reactions. Performed at [facility], [place] [address], [place], [place] [address]   SARS CORONAVIRUS 2 (TAT 6-24 HRS) Nasopharyngeal Nasopharyngeal Swab     Status: None   Collection Time: 06/13/19  2:42 AM   Specimen: Nasopharyngeal Swab  Result Value Ref Range   SARS Coronavirus 2 NEGATIVE NEGATIVE    Comment: (NOTE) SARS-CoV-2 target nucleic acids are NOT DETECTED. The SARS-CoV-2 RNA is generally detectable in upper and lower respiratory specimens during the acute phase of infection. Negative results do not preclude SARS-CoV-2 infection, do not rule out co-infections with other pathogens, and should not be used as the sole basis for treatment or other patient management decisions. Negative results must be combined with clinical observations, patient history, and epidemiological information. The expected result is Negative. Fact Sheet for Patients: SugarRoll.be Fact Sheet for Healthcare Providers: https://www.woods-mathews.com/ This test is not yet approved or cleared by the [place] FDA and  has been authorized for detection and/or diagnosis of SARS-CoV-2 by FDA under an Emergency Use Authorization (EUA). This EUA will remain  in effect (meaning this test can be used) for the duration of the COVID-19 declaration under Section 56 4(b)(1) of the Act, 21 U.S.C. section 360bbb-3(b)(1), unless the authorization is terminated or revoked sooner. Performed at [facility], [place] [name], [place], [place] [address]     Blood Alcohol level:  Lab Results   Component Value Date   ETH <10 06/13/2019   ETH <10 05/20/2017    Physical Findings: AIMS:  , ,  ,  ,    CIWA:    COWS:     Musculoskeletal: Strength & Muscle Tone: Unable to assess via telepsych Gait & Station: Did not see ambulate Patient leans: N/A  Psychiatric Specialty Exam: Physical Exam Vitals and nursing note reviewed. Exam conducted with a chaperone present.  Constitutional:      Appearance: She is well-developed.  Cardiovascular:     Rate and Rhythm: Normal rate.  Pulmonary:     Effort: Pulmonary effort is normal.  Musculoskeletal:        General: Normal range of motion.  Skin:    General: Skin is warm.  Neurological:     Mental Status: She is alert and oriented to person, place, and time.  Psychiatric:        Attention and Perception: Attention normal.        Mood and Affect: Mood is anxious and depressed.        Speech: Speech normal.        Behavior: Behavior is cooperative.        Thought Content: Thought content is paranoid and delusional.        Cognition and Memory: Cognition normal.        Judgment: Judgment is impulsive.     Review of Systems  Constitutional: Negative.   HENT: Negative.   Eyes: Negative.   Respiratory: Negative.   Cardiovascular: Negative.   Gastrointestinal: Negative.   Genitourinary: Negative.   Musculoskeletal: Negative.   Skin: Negative.   Neurological: Negative.   Psychiatric/Behavioral: Negative for suicidal ideas. Hallucinations: Denies.    Blood pressure (!) 153/81, pulse 93, temperature 97.9 F (36.6 C), temperature source Oral, resp. rate 18, height 5\' 2"  (1.575 m), weight 49.4 kg, SpO2 97 %.Body mass index is 19.92 kg/m.  General Appearance: Casual  Eye Contact:  Good  Speech:  Clear and Coherent and Normal Rate  Volume:  Normal  Mood:  Anxious  Affect:  Flat  Thought Process:  Coherent and Disorganized  Orientation:  Other:  Oriented to self and place  Thought Content:  WDL  Suicidal Thoughts:  No   Homicidal Thoughts:  No  Memory:  Immediate;   Fair Recent;   Fair  Judgement:  Impaired  Insight:  Lacking  Psychomotor Activity:  Normal  Concentration:  Concentration: Fair and Attention Span: Fair  Recall:  [employer] of Knowledge:  Fair  Language:  Fair  Akathisia:  No  Handed:  Right  AIMS (if indicated):     Assets:  Communication Skills Desire for Improvement Housing Social Support  ADL's:  Intact  Cognition:  WNL  Sleep:      Treatment Plan Summary: Daily contact with patient to assess and evaluate symptoms and progress in treatment, Medication management and Plan Inpatient psychiatric treatment.  Will continue to seek gero psychiatric inpatient treatment. However, will have daughter speak with patient to evaluate baseline determination.  Continue Risperdal 0.25 mg bid for agitation Remeron 15 mg Q hs Valium 5 mg Q 8 hrs anxiety  Lewis Shock, FNP 06/14/2019, 2:04 PM

## 2019-06-14 NOTE — BH Assessment (Signed)
Per Kristen Pickles, NP, patient continues to meet inpatient criteria. Patient referred to the following facilities for placement:  [facility] Details    [facility] Details    [facility] Details    [facility] Details    [facility] Details    [facility] Details    [facility] Details    [facility] Details    [facility] Details    [facility] Details    [facility] Details    [facility] Details    [facility] Details    [facility] Details    [place] Details    [facility] Details    [place] Details    [facility] Details    [facility] Details    [facility] Details    [facility] Details    [place] Details    [place]

## 2019-06-15 DIAGNOSIS — F203 Undifferentiated schizophrenia: Secondary | ICD-10-CM | POA: Diagnosis not present

## 2019-06-15 NOTE — ED Notes (Signed)
Pt has been able to ambulate this shift with out assist. Pt is able to ambulate to the bathroom when needed and frequently.

## 2019-06-15 NOTE — Progress Notes (Signed)
CSW spoke to pt's daughter Miss Whitman Hero at ph: [phone] who stated sheworks tonight, needs help w/ memory care placement needs/respources to get pt placed and can't p/u the pt until 3pm on 5/27.  CSW provided pt's daughter with the number for [facility] and pt's daughter will call tomorrow.  CSW stated CSW will be glad to provide any needed documentation to any facility, [facility] included, who can assist pt by admitting her into their memory care unit.  2nd shift ED CSW will leave handoff for 1st shift ED CSW.  CSW will continue to follow for D/C needs.  Alphonse Guild. Chauncy Mangiaracina  MSW, LCSW, LCAS, CCS Transitions of Care Clinical Social Worker Care Coordination Department Ph: [phone]

## 2019-06-15 NOTE — Progress Notes (Signed)
Consult request has been received. CSW attempting to follow up at present time  [place] M. Christasia Angeletti LCSWA Transitions of Care  Clinical Social Worker  Ph: [phone] 

## 2019-06-15 NOTE — Progress Notes (Addendum)
CSW received a consult for SNF placement but pt apparently is able to ambulate without difficulty, per the RN's note from 5/24 (see below):  06/13/19: Pt remains calm and cooperative. Alert and orientated x4 able to verbalize needs. Ambulates within the room and to the restroom as needed. Gait steady, no complaints or concerns voiced. Will continue to monitor.  Per the note from 5/23 when pt was BIB [employer] information was relayed that pt, "wonders off occasionally".  CSW spoke to pt's RN today (5/26) who states she has observed the pt ambulating without difficulty and without DME, back and forth to the bathroom on a regular basis, which would result a SNF consult being viewed as inappropriate by the CSW.  CSW will continue to follow for D/C needs.  Alphonse Guild. Fidela Cieslak  MSW, LCSW, LCAS, CCS Transitions of Care Clinical Social Worker Care Coordination Department Ph: [phone]

## 2019-06-15 NOTE — Progress Notes (Signed)
CSW made multiple attempts to reach TTS (was informed no TTS coverage for tonight), psychiatric NP's office and the AC's office to request pt's psyche hold be rescinded as per the psychiatric NP's note from 5/26 at 4:29pm states pt is psyche cleared as of the writing of that note (see below):  "Will psych clear at this time. Social work placed for SNF placement. Unable to reach daughter to provide new update."  CSW attempted to reach daughter and left a hippa-compliant VM requesting a return call and then called dispatch requesting they arrive to the pt's home to alert them pt is ready for D/C.   CSW awaiting return call from [employer].  CSW will continue to follow for D/C needs.  Alphonse Guild. [place]  MSW, LCSW, LCAS, CCS Transitions of Care Clinical Social Worker Care Coordination Department Ph: [phone]

## 2019-06-15 NOTE — ED Notes (Signed)
Pt alert and calm this shift. Pt cooperative. Pt quiet, guarded.  Pt resting comfortably
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
## 2019-06-15 NOTE — Progress Notes (Signed)
CSW reviewed chart and sees pt has been in the ED since 06/12/19 but is as of 4:29pm today (5/26) cleared by psychiatry (see below):  Will psych clear at this time. Social work placed for SNF placement. Unable to reach daughter to provide new update. Did advised patient that social work consult has been placed however considering the length of time it takes to find facilities she may have to go home until placement is found. Patient verbalized understanding and stated that she may no longer be welcomed.   CSW will continue to follow for D/C needs.  Alphonse Guild. Noah Pelaez  MSW, LCSW, LCAS, CCS Transitions of Care Clinical Social Worker Care Coordination Department Ph: [phone]    \

## 2019-06-15 NOTE — Progress Notes (Signed)
Patient is awaiting geri-psych placement. Will continue to follow for social work related needs  [place] Transitions of Care  Clinical Social Worker  Ph: [phone]

## 2019-06-15 NOTE — Progress Notes (Signed)
Received Kristen Colon last PM at the change of shift asleep in her bed with the sitter at the bedside. She continued to sleep throughout the evening until her VS were monitored and she received her HS medications. She received a snack of her choice before drifting back off to sleep. She slept throughout the night and woke up at 0430 hrs to use the restroom.

## 2019-06-16 DIAGNOSIS — F203 Undifferentiated schizophrenia: Secondary | ICD-10-CM | POA: Diagnosis not present

## 2019-06-16 MED ORDER — TUBERCULIN PPD 5 UNIT/0.1ML ID SOLN
5.0000 [IU] | INTRADERMAL | Status: DC
  Administered 2019-06-16: 5 [IU] via INTRADERMAL
  Filled 2019-06-16: qty 0.1

## 2019-06-16 NOTE — NC FL2 (Deleted)
[place] LEVEL OF CARE SCREENING TOOL     IDENTIFICATION  Patient Name: Kristen Colon Birthdate: Nov 07, 1950 Sex: female Admission Date (Current Location): 06/12/2019  [place] and [place] Number:  Kathleen Argue [id number] [place] and Address:         Provider Number: [id number]  Attending Physician Name and Address:  Default, Provider, MD  Relative Name and Phone Number:       Current Level of Care: Hospital Recommended Level of Care: [place] Prior Approval Number:    Date Approved/Denied:   PASRR Number:    Discharge Plan: (ALF)    Current Diagnoses: Patient Active Problem List   Diagnosis Date Noted  . Palliative care by specialist   . Goals of care, counseling/discussion   . Chronic pain due to neoplasm   . Severe comorbid illness   . Sepsis ([place]) 02/22/2019  . Fever 02/21/2019  . Pneumonia due to COVID-19 virus 01/29/2019  . Acute respiratory failure with hypoxia ([place]) 01/27/2019  . COVID-19 virus infection 01/27/2019  . Pelvic mass in female 06/30/2017  . 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 Elevated CA-125 05/12/2017  . Peritoneal carcinomatosis ([place]) 05/12/2017  . 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 Ovarian mass 09/20/2016  . Non-intractable vomiting with nausea 09/20/2016  . Hypertension, uncontrolled 09/20/2016  . Diabetes mellitus type 2 in nonobese ([place]) 09/20/2016  . Acute encephalopathy 09/19/2016  . Schizoaffective disorder, chronic condition with acute exacerbation ([place])   . Schizophrenia, undifferentiated ([place]) 06/21/2014  . Delusional disorder ([place])   . Psychoses ([place])   . Gallbladder polyp 07/21/2011  . Liver mass 07/21/2011    Orientation RESPIRATION BLADDER Height & Weight     Self, Time, Place(Fluctuating orientation)  Normal Continent Weight: 108 lb 14.5 oz (49.4 kg) Height:  5\' 2"  (157.5 cm)  BEHAVIORAL SYMPTOMS/MOOD NEUROLOGICAL BOWEL NUTRITION STATUS      Continent Diet(Regular)   AMBULATORY STATUS COMMUNICATION OF NEEDS Skin   Independent Verbally Normal                       Personal Care Assistance Level of Assistance              Functional Limitations Info             SPECIAL CARE FACTORS FREQUENCY                       Contractures Contractures Info: Not present    Additional Factors Info  Code Status Code Status Info: DNR Allergies Info: Penicillins, 5-alpha Reductase Inhibitors, Percocet (Oxycodone-acetaminophen)           Current Medications (06/16/2019):  This is the current hospital active medication list Current Facility-Administered Medications  Medication Dose Route Frequency Provider Last Rate Last Admin  . buPROPion [facility]) tablet 150 mg  150 mg Oral Daily Ripley Fraise, MD   150 mg at 06/16/19 1043  . diazepam (VALIUM) tablet 5 mg  5 mg Oral Q8H PRN Ripley Fraise, MD   5 mg at 06/15/19 2127  . memantine (NAMENDA) tablet 5 mg  5 mg Oral BID Ripley Fraise, MD   5 mg at 06/16/19 1042  . mirtazapine (REMERON) tablet 15 mg  15 mg Oral QHS Ripley Fraise, MD   15 mg at 06/15/19 2127  . risperiDONE (RISPERDAL) tablet 0.25 mg  0.25 mg Oral BID PRN Ripley Fraise, MD   0.25 mg at 06/13/19 0158   Current Outpatient Medications  Medication  Sig Dispense Refill  . acetaminophen (TYLENOL) 325 MG tablet Take 2 tablets (650 mg total) by mouth every 8 (eight) hours as needed for mild pain or moderate pain. 30 tablet 0  . amLODipine (NORVASC) 5 MG tablet Take 5 mg by mouth daily.    . Ascorbic Acid (VITAMIN C) 500 MG CAPS Take 1,000 mg by mouth daily.     . baclofen (LIORESAL) 10 MG tablet Take 10 mg by mouth daily.     . benztropine (COGENTIN) 0.5 MG tablet Take 0.5 mg by mouth daily.    Marland Kitchen buPROPion (WELLBUTRIN XL) 150 MG 24 hr tablet Take 150 mg by mouth daily.    . Cholecalciferol (VITAMIN D) 50 MCG (2000 UT) tablet Take 2,000 Units by mouth daily.    . cyclobenzaprine (FLEXERIL) 5 MG tablet Take 5 mg by mouth  at bedtime.     . diazepam (VALIUM) 5 MG tablet Take 5 mg by mouth every 8 (eight) hours as needed for anxiety.    . fentaNYL (DURAGESIC) 50 MCG/HR Place 1 patch onto the skin every 3 (three) days. 2 patch 0  . glipiZIDE (GLUCOTROL) 5 MG tablet Take 1 tablet (5 mg total) by mouth 2 (two) times daily. 60 tablet 0  . HYDROcodone-acetaminophen (NORCO/VICODIN) 5-325 MG tablet Take 1 tablet by mouth every 6 (six) hours as needed for moderate pain. 5 tablet 0  . INVEGA SUSTENNA 156 MG/ML SUSP injection Inject 1 mL (156 mg total) into the muscle every 30 (thirty) days. 0.9 mL 0  . linaclotide (LINZESS) 290 MCG CAPS capsule Take 1 capsule (290 mcg total) by mouth daily. 30 capsule 0  . lisinopril (ZESTRIL) 10 MG tablet Take 10 mg by mouth 2 (two) times daily.    Marland Kitchen lubiprostone (AMITIZA) 24 MCG capsule Take 24 mcg by mouth daily.    . memantine (NAMENDA) 5 MG tablet Take 1 tablet (5 mg total) by mouth 2 (two) times daily. 60 tablet 0  . metoprolol tartrate (LOPRESSOR) 25 MG tablet Take 0.5 tablets (12.5 mg total) by mouth 2 (two) times daily. 30 tablet 0  . mirtazapine (REMERON) 15 MG tablet Take 1 tablet (15 mg total) by mouth at bedtime. 30 tablet 0  . Multiple Vitamin (MULTIVITAMIN WITH MINERALS) TABS tablet Take 1 tablet by mouth daily.    . ondansetron (ZOFRAN) 4 MG tablet Take 4 mg by mouth daily at 6 (six) AM. Per daughter takes every morning with morning meds due to meds making her vomit.    . pantoprazole (PROTONIX) 40 MG tablet Take 40 mg by mouth daily.    . QUEtiapine (SEROQUEL) 100 MG tablet Take 100 mg by mouth every 12 (twelve) hours as needed (sleep/agitation).     . risperiDONE (RISPERDAL) 0.25 MG tablet Take 0.25 mg by mouth 2 (two) times daily as needed (agitation).     . thiamine 100 MG tablet Take 1 tablet (100 mg total) by mouth daily. 30 tablet 3  . zinc sulfate 220 (50 Zn) MG capsule Take 220 mg by mouth daily.       Discharge Medications: Please see discharge summary for a list  of discharge medications.  Relevant Imaging Results:  Relevant Lab Results:   Additional Information [id number]  Alphonse Guild Dynesha Woolen, LCSW

## 2019-06-16 NOTE — Progress Notes (Signed)
CSW spoke to pt's daughter [name] who stated she cannot pick up the pt who she has stated previously lives with Miss O'[facility].  CSW explained that while the CSW would be glad to assist the pt's daughter with placement needs the pt cannot remain in the ED until placement has been completed due to the long-term nature of ALF placement.  Pt's daughter then stated she can't pick up the pt.  CSW then stated that again, the CSW would be glad to help but pt is up for D/C.  Pt's daughter then stated she "can't pick her up because I'm having chest pains and they are taking me to the emergency room for help".  CSW encouraged pt's daughter to hang up immediately and go to the emergency room but instead the pt's daughter continued to ask questions regarding placement and a lengthy conversation ensued at the insistence of the pt's daughter about options including the advantage of the pt seeking FL-2/referral information from a PCP for assistance in ALF placement.  Pt's daughter then hung up and called back and stated she had called the PCP who was "amazed" that the pt was being D/C'd and that the pt's daughter was being asked to pick up the pt.  CSW then called the following facility at the pt's daughter's request to forward the pt's needed referral information to   [facility] in [place], [place] Address: [address], [place], [place] [address] Phone: [phone] Fax: [phone]  CSW spoke to [place] in admissions at [facility] who stated the following:  1. Pt will have to be fully vaccinated (pt's daughter stated pt is fully vaccinated).  2. Pt will have to have a TB test.  3.  Pt will have to have a COVID test.  4. Pt will have to have proof of vaccination for COVID (pt's daughter states pt's pharmacy can provide to pt's daughter).  5. Dementia will have to be pt's primary diagnosis  6. Facility will have to have equivalent of pt's H&P faxed to them  at fax: [phone].  CSW will continue to follow for D/C needs.  Alphonse Guild. Crestina Strike  MSW, LCSW, LCAS, CCS Transitions of Care Clinical Social Worker Care Coordination Department Ph: [phone]

## 2019-06-16 NOTE — Progress Notes (Addendum)
CSW sent pt's referral to [facility] with pt's daughter's verbal permission.  CSW reached out to pt x 2 and pt returned call and stated pt's daughter intended to pick up the pt on 06/17/19 by 4pm.  Pt's daughter then stated that she did not feel safe taking the pt back to her house and that the pt's daughter had needed to talk to people about the situation to attempt placement for the pt.  CSW thanked the pt's daughter for letting the CSW know she would be at the ED on 5/28 to p/u the pt but reminded the pt's daughter that on 5/26 that she had said she would p/u the pt on 5/27 but that on 5/27 she did not and CSW asked her again to confirm she would p/u the pt on 5/28 and pt's daughter confirmed again she would p/u the pt on 5/28 by 4pm.     Pt's daughter initially denied she had spoken to the RN CM as the RN CM had recounted but then acknowledged she did but stated Medicaid had never been discussed.  CSW again confirmed he had sent pt's referral to the [facility] ALF.  Pt's daughter was appreciative and thanked the CSW.  CSW will continue to follow for D/C needs.  Alphonse Guild. Elinora Weigand  MSW, LCSW, LCAS, CCS Transitions of Care Clinical Social Worker Care Coordination Department Ph: [phone]

## 2019-06-16 NOTE — Progress Notes (Signed)
TOC CM spoke to pt's dtr, [name] and states she is on her way to ED to seek care. She is unable to pick up pt until tomorrow. She wanted TOC CM/CSW to send FL2 to [facility] ALF. Explained the process for getting pt placed in a ALF can be a long process as ALF will need to secure payment prior to pt being admitted. States she does not want pt to come back to live in her home due to her behavior. [place], [place] ED TOC CM [phone]

## 2019-06-16 NOTE — Progress Notes (Signed)
CSW received a call from the [facility] ED TOC RN CM stating pt's daughter has asked that an FL-2 be "sent" over to the [facility] ALF to assist her in placing the pt after the pt D/C's.  CSW called pt's daughter who, per notes, is stating that although she agreed yesterday (5/26) to this writer to p/u the pt today (5/27), per the notes the pt's daughter is stating she can now only p/u pt on 5/28.  CSW will continue to follow for D/C needs.  Alphonse Guild. Teresa Lemmerman  MSW, LCSW, LCAS, CCS Transitions of Care Clinical Social Worker Care Coordination Department Ph: [phone]

## 2019-06-16 NOTE — Progress Notes (Signed)
Manufacturing engineer [facility]) hospital liaison note  This RN was contacted by T. Ysidro Evert, [facility] assessment counselor, to assess [facility] involvement in pt's care.  Per [facility] chart review pt was discharged on 06/13/19 due to being out of service area. Per daughter pt was taken to non-contracted facility in [place].    In further conversation with Edwin Cap, CM, we discussed re-admitting pt, pending approval by [facility] MD, once pt is placed or returned home.  I appears discharge planning is ongoing at this time.  Please do not hesitate to call with any questions or concerns.  Thank you for the opportunity to participate in this patient's care.  Domenic Moras, BSN, [facility]) [phone] (24h on call)

## 2019-06-16 NOTE — Progress Notes (Addendum)
CSW almost immediately received a call from pt's daughter stating she was now at an urgent care and that they had taken her blood pressure, had told pt's daughter "they are going to admit me" due to the pt's daughter's high-blood pressure and pt's daughter stated that she was going to be admitted by the urgent care "into [facility]".  Pt's daughter when asked by the CSW stated that the pt has been fully vaccinated  CSW then wished the pt's daughter well and the pt's daughter asked when the CSW was to leave for the day, was told 11pm, replied, "Ok, that's good, I will call you back then".  CSW will continue to follow for D/C needs.  Alphonse Guild. Riffey  MSW, LCSW, LCAS, [facility] Transitions of Care Clinical Social Worker Care Coordination Department Ph: [phone]

## 2019-06-16 NOTE — Progress Notes (Signed)
06/16/2019  1504  Patient's daughter [name] called stating she needed to speak with SW because she would not be able to pick up patient. She stated she told them she would be here tomorrow to pick up patient. I let her know she said that on yesterday 5/26 so tomorrow would be today 5/27. Gave message to CM to call back daughter.

## 2019-06-16 NOTE — BH Assessment (Signed)
[place] Assessment Progress Note  Per Merlyn Lot, NP, this pt does not require psychiatric hospitalization at this time.  Pt presents under IVC initiated by pt's daughter and upheld by EDP Ripley Fraise, MD, which has been rescinded by Hampton Abbot, MD.  Pt is to be discharged from [facility].  Pt's daughter will reportedly present around 15:00 today to pick pt up.  Discharge instructions advise pt to continue treatment with her current outpatient provider, with whom this writer is unfamiliar.  Pt's nurse, Tilda Franco, has been notified.  Earlier today pt had an Building control surveyor indicating that pt receives services from [facility] and requesting that they be contacted to coordinate care.  Pt verbally consents to this.  At 11:48 I called them and spoke to their hospital liaison, Chrislyn Edison Pace [phone]).  She reports that during the interval when inpatient geriatric psychiatry placement was being sought for her, they were required to discontinue their services, even though the banner still appeared in EPIC.  If pt continues to need their services, she will need to be re-submitted.  TOC has already consulted on pt to provide the daughter with information about higher level of care facilities.  I have called TOC and spoken to [place], notifying her of this additional need.  Jalene Mullet, [place] Triage Specialist [phone]

## 2019-06-16 NOTE — NC FL2 (Addendum)
[place] LEVEL OF CARE SCREENING TOOL     IDENTIFICATION  Patient Name: Kristen Colon Birthdate: [birth year] Sex: female Admission Date (Current Location): 06/12/2019  [facility] and [place] Number:  [place] Penicillins, 5-alpha Reductase Inhibitors, Percocet (Oxycodone-acetaminophen) Facility and Address:         Provider Number: [id number]  Attending Physician Name and Address:  Default, Provider, MD  Relative Name and Phone Number:       Current Level of Care: Hospital Recommended Level of Care: [place] Prior Approval Number:    Date Approved/Denied:   PASRR Number:    Discharge Plan: Home    Current Diagnoses: Patient Active Problem List   Diagnosis Date Noted  . Dementia   . Goals of care, counseling/discussion   . Chronic pain due to neoplasm   . Severe comorbid illness   . Sepsis ([place]) 02/22/2019  . Fever 02/21/2019  . Pneumonia due to COVID-19 virus 01/29/2019  . Acute respiratory failure with hypoxia ([place]) 01/27/2019  . COVID-19 virus infection 01/27/2019  . Pelvic mass in female 06/30/2017  . 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 Elevated CA-125 05/12/2017  . Peritoneal carcinomatosis ([place]) 05/12/2017  . 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 Ovarian mass 09/20/2016  . Non-intractable vomiting with nausea 09/20/2016  . Hypertension, uncontrolled 09/20/2016  . Diabetes mellitus type 2 in nonobese ([place]) 09/20/2016  . Acute encephalopathy 09/19/2016  . Schizoaffective disorder, chronic condition with acute exacerbation ([place])   . Schizophrenia, undifferentiated ([place]) 06/21/2014  . Delusional disorder ([place])   . Psychoses ([place])   . Gallbladder polyp 07/21/2011  . Liver mass Palliative care by specialist 07/21/2011    Orientation RESPIRATION BLADDER Height & Weight     Self, Time, Place(Fluctuating Orientation)  Normal Continent Weight: 108 lb 14.5 oz (49.4 kg) Height:  5\' 2"  (157.5 cm)  BEHAVIORAL  SYMPTOMS/MOOD NEUROLOGICAL BOWEL NUTRITION STATUS      Continent Diet(Regular)  AMBULATORY STATUS COMMUNICATION OF NEEDS Skin   Independent Verbally Normal                       Personal Care Assistance Level of Assistance              Functional Limitations Info             SPECIAL CARE FACTORS FREQUENCY                       Contractures Contractures Info: Not present    Additional Factors Info  Code Status, Allergies Code Status Info: DNR Allergies Info: Penicillins, 5-alpha Reductase Inhibitors, Percocet (Oxycodone-acetaminophen)           Current Medications (06/16/2019):  This is the current hospital active medication list Current Facility-Administered Medications  Medication Dose Route Frequency Provider Last Rate Last Admin  . buPROPion [facility]) tablet 150 mg  150 mg Oral Daily Ripley Fraise, MD   150 mg at 06/16/19 1043  . diazepam (VALIUM) tablet 5 mg  5 mg Oral Q8H PRN Ripley Fraise, MD   5 mg at 06/15/19 2127  . memantine (NAMENDA) tablet 5 mg  5 mg Oral BID Ripley Fraise, MD   5 mg at 06/16/19 1042  . mirtazapine (REMERON) tablet 15 mg  15 mg Oral QHS Ripley Fraise, MD   15 mg at 06/15/19 2127  . risperiDONE (RISPERDAL) tablet 0.25 mg  0.25 mg Oral BID PRN Ripley Fraise, MD   0.25 mg at 06/13/19 0158  .  tuberculin injection 5 Units  5 Units Intradermal STAT Fredia Sorrow, MD       Current Outpatient Medications  Medication Sig Dispense Refill  . acetaminophen (TYLENOL) 325 MG tablet Take 2 tablets (650 mg total) by mouth every 8 (eight) hours as needed for mild pain or moderate pain. 30 tablet 0  . amLODipine (NORVASC) 5 MG tablet Take 5 mg by mouth daily.    . Ascorbic Acid (VITAMIN C) 500 MG CAPS Take 1,000 mg by mouth daily.     . baclofen (LIORESAL) 10 MG tablet Take 10 mg by mouth daily.     . benztropine (COGENTIN) 0.5 MG tablet Take 0.5 mg by mouth daily.    Marland Kitchen buPROPion (WELLBUTRIN XL) 150 MG 24 hr tablet Take 150  mg by mouth daily.    . Cholecalciferol (VITAMIN D) 50 MCG (2000 UT) tablet Take 2,000 Units by mouth daily.    . cyclobenzaprine (FLEXERIL) 5 MG tablet Take 5 mg by mouth at bedtime.     . diazepam (VALIUM) 5 MG tablet Take 5 mg by mouth every 8 (eight) hours as needed for anxiety.    . fentaNYL (DURAGESIC) 50 MCG/HR Place 1 patch onto the skin every 3 (three) days. 2 patch 0  . glipiZIDE (GLUCOTROL) 5 MG tablet Take 1 tablet (5 mg total) by mouth 2 (two) times daily. 60 tablet 0  . HYDROcodone-acetaminophen (NORCO/VICODIN) 5-325 MG tablet Take 1 tablet by mouth every 6 (six) hours as needed for moderate pain. 5 tablet 0  . INVEGA SUSTENNA 156 MG/ML SUSP injection Inject 1 mL (156 mg total) into the muscle every 30 (thirty) days. 0.9 mL 0  . linaclotide (LINZESS) 290 MCG CAPS capsule Take 1 capsule (290 mcg total) by mouth daily. 30 capsule 0  . lisinopril (ZESTRIL) 10 MG tablet Take 10 mg by mouth 2 (two) times daily.    Marland Kitchen lubiprostone (AMITIZA) 24 MCG capsule Take 24 mcg by mouth daily.    . memantine (NAMENDA) 5 MG tablet Take 1 tablet (5 mg total) by mouth 2 (two) times daily. 60 tablet 0  . metoprolol tartrate (LOPRESSOR) 25 MG tablet Take 0.5 tablets (12.5 mg total) by mouth 2 (two) times daily. 30 tablet 0  . mirtazapine (REMERON) 15 MG tablet Take 1 tablet (15 mg total) by mouth at bedtime. 30 tablet 0  . Multiple Vitamin (MULTIVITAMIN WITH MINERALS) TABS tablet Take 1 tablet by mouth daily.    . ondansetron (ZOFRAN) 4 MG tablet Take 4 mg by mouth daily at 6 (six) AM. Per daughter takes every morning with morning meds due to meds making her vomit.    . pantoprazole (PROTONIX) 40 MG tablet Take 40 mg by mouth daily.    . QUEtiapine (SEROQUEL) 100 MG tablet Take 100 mg by mouth every 12 (twelve) hours as needed (sleep/agitation).     . risperiDONE (RISPERDAL) 0.25 MG tablet Take 0.25 mg by mouth 2 (two) times daily as needed (agitation).     . thiamine 100 MG tablet Take 1 tablet (100 mg  total) by mouth daily. 30 tablet 3  . zinc sulfate 220 (50 Zn) MG capsule Take 220 mg by mouth daily.       Discharge Medications: Please see discharge summary for a list of discharge medications.  Relevant Imaging Results:  Relevant Lab Results:   Additional Information [id number]  Alphonse Guild Riffey, LCSW

## 2019-06-16 NOTE — Progress Notes (Signed)
Pt's COVID test results are negatice as of 06/16/19.  EDP agrees to request a PT consult for ALF placement and to request that a "skin prick" TB test be initiated as requested by the [facility].  EDP agrees to sign pt's FL-2 and CSW will send pt's referral info per pt's daughter's request to both [facility] and [facility].  CSW spoke with pt's daughter whom is POA and confirmed pt's daughter's plan to be discharged to an ALF to live for LTC at discharge.  CSW provided active listening and validated pt's daughter's concerns.   CSW was given permission to complete FL-2 and send referrals out to SNF facilities via the hub per pt's request and to [facility].  Pt has been living independently with her daughter as caretaker prior to being admitted to [facility] ED.  CSW will continue to follow for D/C needs.  Alphonse Guild. Haji Delaine  MSW, LCSW, LCAS, CCS Transitions of Care Clinical Social Worker Care Coordination Department Ph: [phone]

## 2019-06-16 NOTE — Progress Notes (Addendum)
CSW was informed by the RN CM that pt's daughter had requested assistance with switching py's Medicaid to LTC Medicaid and that the RN CM had informed pt's daughter the [facility] ED cannot facilitate pt staying until this is done nor could the [facility] ED initiate this process.  CSW called pt's daughter x 2 and did not get a reply.  CSW will continue to follow for D/C needs.  Alphonse Guild. Traniya Prichett  MSW, LCSW, LCAS, CCS Transitions of Care Clinical Social Worker Care Coordination Department Ph: [phone]

## 2019-06-16 NOTE — Discharge Instructions (Signed)
For your behavioral health needs, you are advised to continue treatment with your current outpatient provider. °

## 2019-06-17 DIAGNOSIS — F203 Undifferentiated schizophrenia: Secondary | ICD-10-CM | POA: Diagnosis not present

## 2019-06-17 MED ORDER — ACETAMINOPHEN 325 MG PO TABS
650.0000 mg | ORAL_TABLET | Freq: Once | ORAL | Status: AC
  Administered 2019-06-17: 650 mg via ORAL
  Filled 2019-06-17: qty 2

## 2019-06-17 NOTE — Progress Notes (Signed)
CSW received phone call from Venia Minks, placement social worker from [place]: [phone]. Natale Milch states that she was calling to get an update on the status of patients placement. CSW provides most recent update in the placement process for patient. CSW goes into detail and explains that CSW recently facilitated facetime assessment with [facility].   Natale Milch adds on patients Daughter [name] on the call. [name] states that patient requires a locked unit due to her dementia diagnosis. Natale Milch states that all facilities including [facility], [facility] and [facility] all include a locked unit.   CSW awaiting decision from the above stated facilities.   [facility] Transitions of Care  Clinical Social Worker  Ph: [phone]

## 2019-06-17 NOTE — Progress Notes (Addendum)
CSW spoke to pt's daughter and updated her as to pt's being denied at 3 facilities pt was referred to and counseled pt's daughter on contacting [facility] on Tuesday and requesting placement.  Pt's daughter now states she, "has a flat tire and can't pick her up" and states CSW will have to call [facility] and request [facility] transport pt as pt was BIB [facility] under IVC.  CSW called dispatch who stated [facility] will be en route shortly to transport pt.  RN/EDP updated.  CSW will continue to follow for D/C needs.  Alphonse Guild. Riffey  MSW, LCSW, LCAS, CCS Transitions of Care Clinical Social Worker Care Coordination Department Ph: [phone]

## 2019-06-17 NOTE — Progress Notes (Signed)
CSW received call from [facility] facility in regards to patient. Kristen Colon would like to send one of her staff members, Freada Bergeron Barns to [place] to visit patient for an assessment.   CSW will accompany staff member of [facility] once she arrives.   TOC team will continue to follow patient for placement needs  [facility] Transitions of Care  Clinical Social Worker  Ph: [phone]

## 2019-06-17 NOTE — Progress Notes (Signed)
4:25- CSW in contact with [facility] in regards to decision regarding patient admission. Mrs. Barns states that the information has been relayed to [facility], Clinical biochemist. CSW provided 2nd shift [facility] CSW contact information. Mrs. Drema Dallas states that Hassan Rowan will be in contact with [facility] team before this evening  4:30- CSW later receives call from Patients daughter [name] who states that she has been in contact with Hassan Rowan, Mudlogger for [employer]. [name] goes into detail and explains that Hassan Rowan states that patient is considered high functioning and therefore would cost an additional $1,000 to reside at facility and will not be able to transition there until Tuesday. [name] states that she is distraught because she is unsure of what to do with her mother until then. CSW explains that she will staff this information with leadership.  [facility] Transitions of Care  Clinical Social Worker  Ph: [phone]

## 2019-06-17 NOTE — Progress Notes (Addendum)
CSW received a call from the following ALF's who have denied the pt because either there are no female beds available [facility]) or because the pt is considered, "too high-functioning".  [place] 2. [facility] 3.   Per Hassan Rowan in admissions at [facility] this means Medicaid will not fully pay for ALF and which means the pt's daughter would have to pay out of pocket for personal care aide (PCA) services which at [facility] in [place], [place] would be a monthly cost of $1,388.  Per Hassan Rowan at [facility] in [place], [place], a request for approval of this private pay arrangement has not been made to their corporate office and cannot be made until Tuesday when their corporate office reopens and per Hassan Rowan, this has never been done, and it is not known if their office will agree to this.  At this time pt has no known placement options other than [facility] and if [facility] makes a decision on Tuesday 06/17/19.  CSW will continue to follow for D/C needs.  Kristen Colon. Riffey  MSW, LCSW, LCAS, CCS Transitions of Care Clinical Social Worker Care Coordination Department Ph: [phone]

## 2019-06-17 NOTE — Progress Notes (Signed)
CSW in contact with Pamala Hurry from [place] and Tori from [place]. CSW sent over referral information via [facility] email.   Herbert Spires explains that she will review patient information and give me a call bck. Pamala Hurry states that she has one Medicaid bed available and will know after 12pm if it can be available for patient.   TOC team will continue to follow patient for discharge related needs  [facility] Transitions of Care  Clinical Social Worker  Ph: [phone]

## 2019-06-17 NOTE — Progress Notes (Addendum)
CSW consulted with CSW Leadership who states all attempts at placement have been made at the request of pt's daughter and three of four have been denied, the fourth states a decision cannot be made to allow pt's daughter to private pay until their office opens on Tuesday and as has been the case at the other three ALF's (all four are owned and operated by the same company) the pt has been otherwise denied due to the pt being seen as "too high-functioning" physically for ALF care.  CSW Leadership stated pt does not need to remain in the ED until Tuesday for a decision to be made as pt has been psychiatrically and medically cleared and pt's daughter has thrice agreed to p/u the pt and has not kept her word all three times and as pt's daughter's requests have all been accommodated but have not resulted in placement in a timely manner.  Pt can still go to [facility] if D/C'd but pt will have to go to a PCP or doctor's office for a new FL-2 if [facility] agrees to placement using pt's partial private-pay option.  Per Hassan Rowan at [employer] pt will not need a new TB test as this has been done in the ED, per the chart on 06/16/19.  CSW will continue to follow for D/C needs.  Alphonse Guild. Riffey  MSW, LCSW, LCAS, CCS Transitions of Care Clinical Social Worker Care Coordination Department Ph: [phone]

## 2019-06-17 NOTE — ED Provider Notes (Signed)
69 yo female w/ hx of schizophrenia and bizarre behavior here with concern for possible psychosis.  Medically cleared and placed in [facility] on 06/13/19 by ED provider.  Patient evaluated by psych team and subsequently had IVC rescinded by Dr Rayfield Citizen MD, and felt not to require inpatient hospitalization per most recent note on 5/27.  Ongoing discussions with patient's daughter about placement in assisted living facility.  2 days of ongoing discussions between CSW/SW and daughter.  Anticipate discharge home with her daughter this afternoon.    Otherwise patient stable overnight, no acute complaints, vitals reviewed.    Wyvonnia Dusky, MD 06/17/19 [phone]

## 2019-06-17 NOTE — Progress Notes (Signed)
06/17/2019  1830  Per SW patient's daughter would like a call from nursing staff. Called patient's daughter [name] to notify per that patient will be returning home. Patient daught states she had already heard the "bad news" that her mom was coming home and now she is "depressed". [name] had no questions for me to answer.
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
## 2019-06-17 NOTE — Progress Notes (Signed)
CSW spoke to Braden in admissions at [facility] who states that since pt can complete her ADL's and is considered, "high-functioning" with everything except her thinking and that the pt's family has to pay an additional $1,388 per month for PCA services since Medicaid will not, and that the pt's daughter has agreed to pay this as pt's daughter insists pt goes to [facility].  Hassan Rowan states that since it is a holiday weekend Hassan Rowan has to wait until Tuesday for Hassan Rowan to call their corporate office to seek a final decision as to whether the company will agree to the pt's daughter's decision to pay out-of-pocket and allow this to be done, a contract drawn up, etc.  Hassan Rowan in admissions states she has no idea if corporate at [employer] will agree to this as no one has ever agreed to pay out of pocket in the past for $1,388 per month for PCA services.  Hassan Rowan states that if the pt returns home the pt will have to seek a different FL-2 and have pt begin a new referral to go to [facility] but that the pt's TB test, if read, will still stand.  CSW called Pamala Hurry at the [facility] who states they have no available beds for the pt or a female bed at all for the forseeable future.  Pamala Hurry agreed to call Brantley Persons at [facility] to ask if they have a female medicaid bed available.  CSW will continue to follow for D/C needs.  Alphonse Guild. Javonna Balli  MSW, LCSW, LCAS, CCS Transitions of Care Clinical Social Worker Care Coordination Department Ph: [phone]

## 2019-06-17 NOTE — Progress Notes (Signed)
Manufacturing engineer [facility])  Ms. Harton is not a current patient of [facility].  The pt was discharged from hospice services when her daughter [name] told us she was residing at an ALF in [place], which is not in our services area.    [name] would like her mother placed at facility in [place], which we cannot services as it is out of our service area.  Thank you, Venia Carbon RN, BSN, [facility] Liaison

## 2019-06-22 NOTE — Progress Notes (Signed)
Late entry for 5/28   CSW at bedside to facilitate Facetime phone call between patient and staff at [facility] ALF/Memory Care unit. Staff assessed for patients orientation levels and ability to perform ADLs. Elmer states that she will inform Hassan Rowan of [facility] of their findings and will contact us back with a decision regarding admission.   TOC will continue to follow patient for placement needs  [place] Transitions of Care  Clinical Social Worker  Ph: [phone]

## 2019-06-24 ENCOUNTER — Telehealth: Payer: Self-pay | Admitting: *Deleted

## 2019-06-24 NOTE — Telephone Encounter (Signed)
TOC CM received call from Jacelyn Grip, CSW DSS requesting FL2 and progress notes. They are assisting pt with placement. TOC CM contacted dtr, [name] to verify.

## 2020-03-21 ENCOUNTER — Emergency Department (HOSPITAL_COMMUNITY)
Admission: EM | Admit: 2020-03-21 | Discharge: 2020-03-22 | Disposition: A | Discharge status: Home / Self Care | Payer: Medicare Other | Attending: Emergency Medicine | Admitting: Emergency Medicine

## 2020-03-21 DIAGNOSIS — Z8543 Personal history of malignant neoplasm of ovary: Secondary | ICD-10-CM | POA: Diagnosis not present

## 2020-03-21 DIAGNOSIS — F0390 Unspecified dementia without behavioral disturbance: Secondary | ICD-10-CM | POA: Insufficient documentation

## 2020-03-21 DIAGNOSIS — F200 Paranoid schizophrenia: Secondary | ICD-10-CM | POA: Diagnosis not present

## 2020-03-21 DIAGNOSIS — Z8616 Personal history of COVID-19: Secondary | ICD-10-CM | POA: Insufficient documentation

## 2020-03-21 DIAGNOSIS — Z7984 Long term (current) use of oral hypoglycemic drugs: Secondary | ICD-10-CM | POA: Diagnosis not present

## 2020-03-21 DIAGNOSIS — F17210 Nicotine dependence, cigarettes, uncomplicated: Secondary | ICD-10-CM | POA: Diagnosis not present

## 2020-03-21 DIAGNOSIS — Z046 Encounter for general psychiatric examination, requested by authority: Secondary | ICD-10-CM | POA: Diagnosis present

## 2020-03-21 DIAGNOSIS — Z79899 Other long term (current) drug therapy: Secondary | ICD-10-CM | POA: Diagnosis not present

## 2020-03-21 DIAGNOSIS — E119 Type 2 diabetes mellitus without complications: Secondary | ICD-10-CM | POA: Insufficient documentation

## 2020-03-21 DIAGNOSIS — I10 Essential (primary) hypertension: Secondary | ICD-10-CM | POA: Diagnosis not present

## 2020-03-21 LAB — URINALYSIS, ROUTINE W REFLEX MICROSCOPIC
Bilirubin Urine: NEGATIVE
Glucose, UA: 150 mg/dL — AB
Hgb urine dipstick: NEGATIVE
Ketones, ur: 5 mg/dL — AB
Leukocytes,Ua: NEGATIVE
Nitrite: NEGATIVE
Protein, ur: NEGATIVE mg/dL
Specific Gravity, Urine: 1.021 (ref 1.005–1.030)
pH: 5 (ref 5.0–8.0)

## 2020-03-21 LAB — CBC WITH DIFFERENTIAL/PLATELET
Abs Immature Granulocytes: 0.01 10*3/uL (ref 0.00–0.07)
Basophils Absolute: 0 10*3/uL (ref 0.0–0.1)
Basophils Relative: 0 %
Eosinophils Absolute: 0 10*3/uL (ref 0.0–0.5)
Eosinophils Relative: 1 %
HCT: 40.5 % (ref 36.0–46.0)
Hemoglobin: 13.5 g/dL (ref 12.0–15.0)
Immature Granulocytes: 0 %
Lymphocytes Relative: 26 %
Lymphs Abs: 2 10*3/uL (ref 0.7–4.0)
MCH: 29 pg (ref 26.0–34.0)
MCHC: 33.3 g/dL (ref 30.0–36.0)
MCV: 87.1 fL (ref 80.0–100.0)
Monocytes Absolute: 0.5 10*3/uL (ref 0.1–1.0)
Monocytes Relative: 7 %
Neutro Abs: 5 10*3/uL (ref 1.7–7.7)
Neutrophils Relative %: 66 %
Platelets: 311 10*3/uL (ref 150–400)
RBC: 4.65 MIL/uL (ref 3.87–5.11)
RDW: 15.8 % — ABNORMAL HIGH (ref 11.5–15.5)
WBC: 7.5 10*3/uL (ref 4.0–10.5)
nRBC: 0 % (ref 0.0–0.2)

## 2020-03-21 LAB — COMPREHENSIVE METABOLIC PANEL
ALT: 23 U/L (ref 0–44)
AST: 30 U/L (ref 15–41)
Albumin: 3.9 g/dL (ref 3.5–5.0)
Alkaline Phosphatase: 69 U/L (ref 38–126)
Anion gap: 14 (ref 5–15)
BUN: 9 mg/dL (ref 8–23)
CO2: 20 mmol/L — ABNORMAL LOW (ref 22–32)
Calcium: 9.8 mg/dL (ref 8.9–10.3)
Chloride: 105 mmol/L (ref 98–111)
Creatinine, Ser: 0.74 mg/dL (ref 0.44–1.00)
GFR, Estimated: >60 mL/min (ref >60)
Glucose, Bld: 262 mg/dL — ABNORMAL HIGH (ref 70–99)
Potassium: 3.6 mmol/L (ref 3.5–5.1)
Sodium: 139 mmol/L (ref 135–145)
Total Bilirubin: 0.4 mg/dL (ref 0.3–1.2)
Total Protein: 7.4 g/dL (ref 6.5–8.1)

## 2020-03-21 LAB — SALICYLATE LEVEL: Salicylate Lvl: <7 mg/dL — ABNORMAL LOW (ref 7.0–30.0)

## 2020-03-21 LAB — ETHANOL: Alcohol, Ethyl (B): <10 mg/dL (ref <10)

## 2020-03-21 LAB — ACETAMINOPHEN LEVEL: Acetaminophen (Tylenol), Serum: <10 ug/mL — ABNORMAL LOW (ref 10–30)

## 2020-03-21 MED ORDER — GLIPIZIDE 10 MG PO TABS
5.0000 mg | ORAL_TABLET | Freq: Two times a day (BID) | ORAL | Status: DC
  Administered 2020-03-22: 5 mg via ORAL
  Filled 2020-03-21 (×3): qty 0.5

## 2020-03-21 MED ORDER — RISPERIDONE 0.5 MG PO TABS
0.2500 mg | ORAL_TABLET | Freq: Two times a day (BID) | ORAL | Status: DC | PRN
  Administered 2020-03-21: 0.25 mg via ORAL
  Filled 2020-03-21: qty 1

## 2020-03-21 MED ORDER — BENZTROPINE MESYLATE 1 MG PO TABS
0.5000 mg | ORAL_TABLET | Freq: Every day | ORAL | Status: DC
  Administered 2020-03-22: 0.5 mg via ORAL
  Filled 2020-03-21: qty 1

## 2020-03-21 MED ORDER — QUETIAPINE FUMARATE 50 MG PO TABS
100.0000 mg | ORAL_TABLET | Freq: Two times a day (BID) | ORAL | Status: DC | PRN
  Administered 2020-03-21: 100 mg via ORAL
  Filled 2020-03-21: qty 2

## 2020-03-21 MED ORDER — AMLODIPINE BESYLATE 5 MG PO TABS
5.0000 mg | ORAL_TABLET | Freq: Every day | ORAL | Status: DC
  Administered 2020-03-22: 5 mg via ORAL
  Filled 2020-03-21: qty 1

## 2020-03-21 MED ORDER — LUBIPROSTONE 24 MCG PO CAPS: 24.0000 ug | ORAL_CAPSULE | Freq: Every day | ORAL | Status: DC

## 2020-03-21 MED ORDER — BUPROPION HCL ER (XL) 150 MG PO TB24
150.0000 mg | ORAL_TABLET | Freq: Every day | ORAL | Status: DC
  Administered 2020-03-22: 150 mg via ORAL
  Filled 2020-03-21: qty 1

## 2020-03-21 MED ORDER — HALOPERIDOL LACTATE 5 MG/ML IJ SOLN: 2.0000 mg | Freq: Once | INTRAMUSCULAR | Status: AC

## 2020-03-21 MED ORDER — GLIPIZIDE 5 MG PO TABS
5.0000 mg | ORAL_TABLET | Freq: Two times a day (BID) | ORAL | Status: DC
  Filled 2020-03-21: qty 1

## 2020-03-21 MED ORDER — INVEGA SUSTENNA 156 MG/ML IM SUSP: 156.0000 mg | INTRAMUSCULAR | Status: DC

## 2020-03-21 MED ORDER — LISINOPRIL 10 MG PO TABS
10.0000 mg | ORAL_TABLET | Freq: Two times a day (BID) | ORAL | Status: DC
  Administered 2020-03-21 – 2020-03-22 (×2): 10 mg via ORAL
  Filled 2020-03-21 (×2): qty 1

## 2020-03-21 MED ORDER — HALOPERIDOL LACTATE 5 MG/ML IJ SOLN
INTRAMUSCULAR | Status: AC
  Administered 2020-03-21: 5 mg
  Filled 2020-03-21: qty 1

## 2020-03-21 NOTE — ED Notes (Signed)
Pt is being TTS at this time 

## 2020-03-21 NOTE — Progress Notes (Signed)
Manufacturing engineer [facility]) Hospital Liaison note.  This is a current hospice patient with [facility]. This patient is enrolled in hospice services, admitted 10/07/2017  with a terminal diagnosis of ovarian cancer. [facility] will continue to follow for any discharge planning needs and to coordinate continuation of hospice care.  A Please do not hesitate to call with questions.   Thank you,   Farrel Gordon, RN, [place] (listed on [facility] under [place])    [phone]

## 2020-03-21 NOTE — Progress Notes (Signed)
CSW spoke with daughter, [name] @[phone] to inform her that Pt has been medically cleared and psych cleared. Daughter states that this call from [place] is the first that she has been informed that Pt is cleared and therefore she has not planned for a night aide. Daughter works overnight and cannot come and get Pt tonight.. Daughter will be in the ED to pick up Pt tomorrow morning 03/23/20 between 9:30 and 10 am.

## 2020-03-21 NOTE — Progress Notes (Signed)
Manufacturing engineer [facility])      Per report from hospice homecare RN pt has been having urinary urgency in past two days. Also, pt has been off her long-standing fentanyl patch since Sunday, which could be adding to the agitation.  Pt has been on Invega for years.    Thank you for the opportunity to participate in this patients care.     Domenic Moras, BSN, RN [facility] Liaison   [phone] (24 h on call)

## 2020-03-21 NOTE — BH Assessment (Signed)
Comprehensive Clinical Assessment (CCA) Note  03/21/2020 Kristen Colon [id number]   Patient is a 70 year old female presenting voluntarily to [facility] ED with her daughter/ legal guardian, Larkin Ina, with a chief complaint of increased confusion and agitation. Patient gives a limited history due to AMS. Her thought process is disorganized. She is able to state her name and date but is not able to tell me where she is or how she got there. Patient rambles incoherently at times in assessment. Patient denies SI/HI/AVH. This counselor asked his [name] could be contacted and she states "Yea, I don't know what she is but she brings me to the hospital when I don't agree with her." Additional history obtained from legal guardian.  Per Larkin Ina [phone]: Patient is diagnosed with schizophrenia, alzheimer's, and ovarian cancer. She lives with patient but feels she needs placement as she is struggling to care for her. She states patient got her Invega injection yesterday and at that time her doctor suggested patient may have a UTI. Urinalysis has not been completed. Additionally, she states patient has not taken any of her medications, psychiatric or otherwise, in 2 weeks as she refuses them. She states patient is "acting out," refusing to bath, take meds, destroying property, and throwing things in the home. Patient is also in hospice care.  Per Harriett Sine, PMHNP patient does not meet in patient care criteria and is psych cleared with the recommendation of a TOC consult.  Chief Complaint:  Chief Complaint  Patient presents with  . Medical Clearance   Visit Diagnosis: Dementia    F20.9 Schizophrenia (per history)  CCA Biopsychosocial Intake/Chief Complaint:  NA  Current Symptoms/Problems: NA   Patient Reported Schizophrenia/Schizoaffective Diagnosis in Past: Yes   Strengths: NA  Preferences: NA  Abilities: NA   Type of Services Patient Feels are Needed: NA   Initial Clinical  Notes/Concerns: NA   Mental Health Symptoms Depression:  None   Duration of Depressive symptoms: No data recorded  Mania:  None   Anxiety:   None   Psychosis:  Delusions; Grossly disorganized speech   Duration of Psychotic symptoms: Greater than six months   Trauma:  None   Obsessions:  None   Compulsions:  None   Inattention:  None   Hyperactivity/Impulsivity:  N/A   Oppositional/Defiant Behaviors:  N/A   Emotional Irregularity:  N/A   Other Mood/Personality Symptoms:  No data recorded   Mental Status Exam Appearance and self-care  Stature:  Average   Weight:  Thin   Clothing:  Neat/clean   Grooming:  Normal   Cosmetic use:  None   Posture/gait:  Slumped   Motor activity:  Not Remarkable   Sensorium  Attention:  Normal; Confused   Concentration:  Scattered   Orientation:  Person; Object   Recall/memory:  Defective in Recent; Defective in Short-term; Defective in Immediate; Defective in Remote   Affect and Mood  Affect:  Flat   Mood:  Dysphoric   Relating  Eye contact:  None   Facial expression:  Constricted   Attitude toward examiner:  Guarded   Thought and Language  Speech flow: Flight of Ideas   Thought content:  Delusions   Preoccupation:  None   Hallucinations:  None   Organization:  No data recorded  [employer] of Knowledge:  Good   Intelligence:  Average   Abstraction:  Concrete   Judgement:  Impaired   Reality Testing:  Distorted   Insight:  Lacking  Decision Making:  Confused   Social Functioning  Social Maturity:  Irresponsible   Social Judgement:  Heedless   Stress  Stressors:  Illness   Coping Ability:  Programme researcher, broadcasting/film/video Deficits:  Activities of daily living; Communication; Self-care; Self-control   Supports:  Family; Friends/Service system     Religion: Religion/Spirituality Are You A Religious Person?: No  Leisure/Recreation: Leisure / Recreation Do You Have Hobbies?:  No  Exercise/Diet: Exercise/Diet Do You Exercise?: No Have You Gained or Lost A Significant Amount of Weight in the Past Six Months?: No Do You Follow a Special Diet?: No Do You Have Any Trouble Sleeping?: No   CCA Employment/Education Employment/Work Situation: Employment / Work Copywriter, advertising Employment situation: Retired Archivist job has been impacted by current illness: No What is the longest time patient has a held a job?: UTA Where was the patient employed at that time?: UTA Has patient ever been in the [employer]?: No  Education: Education Is Patient Currently Attending School?: No Name of High School: UTA Did Teacher, adult education From [employer]?:  (UTA) Did You Attend College?:  (UTA) Did You Attend Graduate School?:  (UTA) Did You Have Any Special Interests In School?: UTA Did You Have An Individualized Education Program (IIEP):  (UTA) Did You Have Any Difficulty At School?:  (UTA) Patient's Education Has Been Impacted by Current Illness:  (UTA)   CCA Family/Childhood History Family and Relationship History: Family history Marital status: Divorced Divorced, when?: UTA What types of issues is patient dealing with in the relationship?: UTA Additional relationship information: UTA Are you sexually active?: No What is your sexual orientation?: NA Has your sexual activity been affected by drugs, alcohol, medication, or emotional stress?: NA Does patient have children?: Yes How many children?: 1 (atleast) How is patient's relationship with their children?: Patient's daughter is legal guardian  Childhood History:  Childhood History By whom was/is the patient raised?:  (UTA) Additional childhood history information: UTA Description of patient's relationship with caregiver when they were a child: UTA Patient's description of current relationship with people who raised him/her: UTA How were you disciplined when you got in trouble as a child/adolescent?: UTA Does patient have  siblings?:  (UTA) Did patient suffer any verbal/emotional/physical/sexual abuse as a child?:  (UTA) Did patient suffer from severe childhood neglect?:  (UTA) Has patient ever been sexually abused/assaulted/raped as an adolescent or adult?:  (UTA) Was the patient ever a victim of a crime or a disaster?:  (UTA) Witnessed domestic violence?:  (UTA) Has patient been affected by domestic violence as an adult?:  Special educational needs teacher)  Child/Adolescent Assessment:     CCA Substance Use Alcohol/Drug Use: Alcohol / Drug Use Pain Medications: see MAR Prescriptions: see MAR Over the Counter: see MAR History of alcohol / drug use?:  (denies)                         ASAM's:  Six Dimensions of Multidimensional Assessment  Dimension 1:  Acute Intoxication and/or Withdrawal Potential:      Dimension 2:  Biomedical Conditions and Complications:      Dimension 3:  Emotional, Behavioral, or Cognitive Conditions and Complications:     Dimension 4:  Readiness to Change:     Dimension 5:  Relapse, Continued use, or Continued Problem Potential:     Dimension 6:  Recovery/Living Environment:     ASAM Severity Score:    ASAM Recommended Level of Treatment:     Substance use Disorder (SUD)  Recommendations for Services/Supports/Treatments:    DSM5 Diagnoses: Patient Active Problem List   Diagnosis Date Noted  . Palliative care by specialist   . Goals of care, counseling/discussion   . Chronic pain due to neoplasm   . Severe comorbid illness   . Sepsis ([place]) 02/22/2019  . Fever 02/21/2019  . Pneumonia due to COVID-19 virus 01/29/2019  . Acute respiratory failure with hypoxia ([place]) 01/27/2019  . COVID-19 virus infection 01/27/2019  . Pelvic mass in female 06/30/2017  . 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 Elevated CA-125 05/12/2017  . Peritoneal carcinomatosis ([place]) 05/12/2017  . 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 Ovarian mass 09/20/2016  . Non-intractable vomiting with nausea  09/20/2016  . Hypertension, uncontrolled 09/20/2016  . Diabetes mellitus type 2 in nonobese ([place]) 09/20/2016  . Acute encephalopathy 09/19/2016  . Schizoaffective disorder, chronic condition with acute exacerbation ([place])   . Schizophrenia, undifferentiated ([place]) 06/21/2014  . Delusional disorder ([place])   . 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 Gallbladder polyp 07/21/2011  . Liver mass 07/21/2011    Patient Centered Plan: Patient is on the following Treatment Plan(s):  Referrals to Alternative Service(s): Referred to Alternative Service(s):   Place:   Date:   Time:    Referred to Alternative Service(s):   Place:   Date:   Time:    Referred to Alternative Service(s):   Place:   Date:   Time:    Referred to Alternative Service(s):   Place:   Date:   Time:     Orvis Brill, LCSW

## 2020-03-21 NOTE — BHH Counselor (Signed)
Per Harriett Sine, PMHNP patient does not meet in patient care criteria and is psych cleared with the recommendation of a TOC consult. Attempted to reach RN by phone to inform of disposition. This counselor will send secure chat.

## 2020-03-21 NOTE — ED Triage Notes (Addendum)
Patient brought in by daughter for paranoia and increased disorientation. Patient has history of schizophrenia, received first dose of invega yesterday. Patient refuses to eat for fear of poisoning. Patient uncooperative during triage, refusing vital signs, blood work, placement of hospital ID bracelet.

## 2020-03-21 NOTE — Progress Notes (Signed)
CSW contacted Chrislyn from [facility] to discuss patient needs.

## 2020-03-21 NOTE — ED Notes (Signed)
Pt has 3 bags of belongings in purple zone locker 12 Pt IVC paperwork is being completed now

## 2020-03-21 NOTE — ED Notes (Signed)
Pt is in blue scrubs she refused to wear burgundy scurbs

## 2020-03-21 NOTE — ED Notes (Signed)
Pt refuses to be hooked up to cardiac monitor

## 2020-03-21 NOTE — ED Provider Notes (Signed)
[facility] EMERGENCY DEPARTMENT Provider Note   CSN: [id number] Arrival date & time: 03/21/20  7628     History Chief Complaint  Patient presents with  . Medical Clearance    Kristen Colon is a 70 y.o. female possible history of dementia, hypertension, schizophrenia who presents with daughter for concerns of paranoia and increased agitation.  Daughter reports that patient has a history of schizophrenia.  She currently lives with the daughter.  There is a home health nurse who comes out and gives patient her medications.  Daughter states that for the last 2 weeks, she has had noted some increased paranoia, agitation.  She states that yesterday, she got an [place] shot but has not had improvement.  Today, she did not want to take any of her medications.  Daughter brought patient into the ED for evaluation and possible placement.  Patient states she does not need to be here.  She states that she has been taking her medications but she feels like they are not dissolving in her stomach and are causing her to have pain.  She states that she did not take her medications today because "I did not want anybody else making that decision for me."  She states that today, her daughter served her cereal that was raisin Bran.  She states that she did not want the reasons because she did not want to eat them.  She states that she did not think that they were poisoning her and when it was not concerned that the food was poisoned.  She denies any SI, HI, auditory or visual hallucinations.  She states she has been having some dysuria.  No vomiting, fevers, difficulty breathing.  The history is provided by the patient and a relative.       Past Medical History:  Diagnosis Date  . Anxiety   . Dementia ([place])   . Dyspnea   . Headache   . Hypertension   . Schizophrenia [facility])    paranoid    Patient Active Problem List   Diagnosis Date Noted  . Palliative care by specialist   . Goals of  care, counseling/discussion   . Chronic pain due to neoplasm   . Severe comorbid illness   . Sepsis ([place]) 02/22/2019  . Fever 02/21/2019  . Pneumonia due to COVID-19 virus 01/29/2019  . Acute respiratory failure with hypoxia ([place]) 01/27/2019  . COVID-19 virus infection 01/27/2019  . Pelvic mass in female 06/30/2017  . 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 Elevated CA-125 05/12/2017  . Peritoneal carcinomatosis ([place]) 05/12/2017  . Left ovarian epithelial cancer ([place]) 04/14/2017  . Ovarian mass 09/20/2016  . Non-intractable vomiting with nausea 09/20/2016  . Hypertension, uncontrolled 09/20/2016  . Diabetes mellitus type 2 in nonobese ([place]) 09/20/2016  . Acute encephalopathy 09/19/2016  . Schizoaffective disorder, chronic condition with acute exacerbation ([place])   . Schizophrenia, undifferentiated ([place]) 06/21/2014  . Delusional disorder ([place])   . Psychoses ([place])   . Gallbladder polyp 07/21/2011  . Liver mass 07/21/2011    Past Surgical History:  Procedure Laterality Date  . DIAGNOSTIC LAPAROSCOPY     pelvic mass Dr. Denman George 06-30-17  . LAPAROSCOPY N/A 06/30/2017   Procedure: LAPAROSCOPY DIAGNOSTIC WITH BIOPSIES;  Surgeon: Everitt Amber, MD;  Location: [facility]S;  Service: Gynecology;  Laterality: N/A;     OB History   No obstetric history on file.    Obstetric Comments  Pt is unsure how many times she has been pregnant and do not  know how many children because she did not raise any of them.        Family History  Problem Relation Age of Onset  . Heart disease Mother   . Diabetes Sister     Social History   Tobacco Use  . Smoking status: Current Every Day Smoker    Packs/day: 1.00    Years: 5.00    Pack years: 5.00    Types: Cigarettes  . Smokeless tobacco: Never Used  Vaping Use  . Vaping Use: Never used  Substance Use Topics  . Alcohol use: Not Currently  . Drug use: Not Currently    Types: Marijuana    Home Medications Prior to Admission medications   Medication  Sig Start Date End Date Taking? Authorizing Provider  baclofen (LIORESAL) 10 MG tablet Take 10 mg by mouth daily.   Yes [provider]  buPROPion (WELLBUTRIN XL) 150 MG 24 hr tablet Take 150 mg by mouth daily. 06/10/19  Yes [provider]  fentaNYL (DURAGESIC) 75 MCG/HR Place 1 patch onto the skin every 3 (three) days.   Yes [provider]  glipiZIDE (GLUCOTROL) 5 MG tablet Take 1 tablet (5 mg total) by mouth 2 (two) times daily. 03/02/19 06/13/19 Yes Patrecia Pour, MD  INVEGA SUSTENNA 156 MG/ML SUSP injection Inject 1 mL (156 mg total) into the muscle every 30 (thirty) days. 09/05/14  Yes Dorie Rank, MD  linaclotide [facility]) 290 MCG CAPS capsule Take 1 capsule (290 mcg total) by mouth daily. 04/16/17 06/13/19 Yes Arrien, Jimmy Picket, MD  memantine (NAMENDA) 5 MG tablet Take 1 tablet (5 mg total) by mouth 2 (two) times daily. 03/02/19  Yes Patrecia Pour, MD  metoprolol tartrate (LOPRESSOR) 25 MG tablet Take 0.5 tablets (12.5 mg total) by mouth 2 (two) times daily. 03/02/19  Yes Patrecia Pour, MD  mirtazapine (REMERON) 15 MG tablet Take 1 tablet (15 mg total) by mouth at bedtime. 03/02/19 06/13/19 Yes Patrecia Pour, MD  prochlorperazine (COMPAZINE) 10 MG tablet Take 10 mg by mouth daily.   Yes [provider]  risperiDONE (RISPERDAL) 0.25 MG tablet Take 0.25 mg by mouth 2 (two) times daily as needed (agitation).    Yes [provider]  acetaminophen (TYLENOL) 325 MG tablet Take 2 tablets (650 mg total) by mouth every 8 (eight) hours as needed for mild pain or moderate pain. 03/02/19   Patrecia Pour, MD  amLODipine (NORVASC) 5 MG tablet Take 5 mg by mouth daily. 05/16/19   [provider]  Ascorbic Acid (VITAMIN C) 500 MG CAPS Take 1,000 mg by mouth daily.     [provider]  benztropine (COGENTIN) 0.5 MG tablet Take 0.5 mg by mouth daily. 05/20/19   [provider]  Cholecalciferol (VITAMIN D) 50 MCG (2000 UT) tablet Take 2,000 Units  by mouth daily.    [provider]  cyclobenzaprine (FLEXERIL) 5 MG tablet Take 5 mg by mouth at bedtime.  12/10/18   [provider]  diazepam (VALIUM) 5 MG tablet Take 5 mg by mouth every 8 (eight) hours as needed for anxiety.    [provider]  fentaNYL (DURAGESIC) 50 MCG/HR Place 1 patch onto the skin every 3 (three) days. Patient not taking: Reported on 03/21/2020 03/02/19   Patrecia Pour, MD  HYDROcodone-acetaminophen (NORCO/VICODIN) 5-325 MG tablet Take 1 tablet by mouth every 6 (six) hours as needed for moderate pain. 03/02/19   Patrecia Pour, MD  lactulose ([place]) 10  GM/15ML solution Take 15 mLs by mouth. 03/09/20   [provider]  lisinopril (ZESTRIL) 10 MG tablet Take 10 mg by mouth 2 (two) times daily. 06/10/19   [provider]  lubiprostone (AMITIZA) 24 MCG capsule Take 24 mcg by mouth daily.    [provider]  Multiple Vitamin (MULTIVITAMIN WITH MINERALS) TABS tablet Take 1 tablet by mouth daily.    [provider]  ondansetron (ZOFRAN) 4 MG tablet Take 4 mg by mouth daily at 6 (six) AM. Per daughter takes every morning with morning meds due to meds making her vomit.    [provider]  pantoprazole (PROTONIX) 40 MG tablet Take 40 mg by mouth daily. 06/10/19   [provider]  QUEtiapine (SEROQUEL) 100 MG tablet Take 100 mg by mouth every 12 (twelve) hours as needed (sleep/agitation).  12/27/18   [provider]  thiamine 100 MG tablet Take 1 tablet (100 mg total) by mouth daily. 09/24/16   Rai, Ripudeep K, MD  zinc sulfate 220 (50 Zn) MG capsule Take 220 mg by mouth daily.    [provider]    Allergies    Penicillins, 5-alpha reductase inhibitors, and Percocet [oxycodone-acetaminophen]  Review of Systems   Review of Systems  Constitutional: Negative for fever.  Respiratory: Negative for shortness of breath.   Gastrointestinal: Negative for abdominal pain, nausea and vomiting.   Genitourinary: Positive for dysuria. Negative for hematuria.  All other systems reviewed and are negative.   Physical Exam Updated Vital Signs BP (!) 172/68   Pulse 78   Temp (!) 97 F (36.1 C) (Oral)   Resp 18   LMP  (LMP Unknown) Comment: years ago per Pt  SpO2 100%   Physical Exam Vitals and nursing note reviewed.  Constitutional:      Appearance: Normal appearance. She is well-developed and well-nourished.  HENT:     Head: Normocephalic and atraumatic.     Mouth/Throat:     Mouth: Oropharynx is clear and moist and mucous membranes are normal.  Eyes:     General: Lids are normal.     Extraocular Movements: EOM normal.     Conjunctiva/sclera: Conjunctivae normal.     Pupils: Pupils are equal, round, and reactive to light.  Cardiovascular:     Rate and Rhythm: Normal rate and regular rhythm.     Pulses: Normal pulses.     Heart sounds: Normal heart sounds. No murmur heard. No friction rub. No gallop.   Pulmonary:     Effort: Pulmonary effort is normal.     Breath sounds: Normal breath sounds.     Comments: Lungs clear to auscultation bilaterally.  Symmetric chest rise.  No wheezing, rales, rhonchi. Abdominal:     Palpations: Abdomen is soft. Abdomen is not rigid.     Tenderness: There is no abdominal tenderness. There is no guarding.     Comments: Abdomen is soft, non-distended, non-tender. No rigidity, No guarding. No peritoneal signs.  Musculoskeletal:        General: Normal range of motion.     Cervical back: Full passive range of motion without pain.  Skin:    General: Skin is warm and dry.     Capillary Refill: Capillary refill takes less than 2 seconds.  Neurological:     Mental Status: She is alert and oriented to person, place, and time.     Comments: Patient answers questions appropriately.  She went home her name and what year it is.  When  asked where we are at she said I do not know but when I said is at the hospital she said yes I am in the emergency  hospital.  Psychiatric:        Mood and Affect: Mood and affect normal.     Comments: Slightly rapid speech.  Able to be redirected.  No SI,HI or A/V hallucinations     ED Results / Procedures / Treatments   Labs (all labs ordered are listed, but only abnormal results are displayed) Labs Reviewed  COMPREHENSIVE METABOLIC PANEL - Abnormal; Notable for the following components:      Result Value   CO2 20 (*)    Glucose, Bld 262 (*)    All other components within normal limits  CBC WITH DIFFERENTIAL/PLATELET - Abnormal; Notable for the following components:   RDW 15.8 (*)    All other components within normal limits  URINALYSIS, ROUTINE W REFLEX MICROSCOPIC - Abnormal; Notable for the following components:   APPearance HAZY (*)    Glucose, UA 150 (*)    Ketones, ur 5 (*)    Bacteria, UA MANY (*)    All other components within normal limits  ACETAMINOPHEN LEVEL - Abnormal; Notable for the following components:   Acetaminophen (Tylenol), Serum <10 (*)    All other components within normal limits  SALICYLATE LEVEL - Abnormal; Notable for the following components:   Salicylate Lvl <4.8 (*)    All other components within normal limits  ETHANOL    EKG EKG Interpretation  Date/Time:  Wednesday March 21 2020 11:31:00 EST Ventricular Rate:  89 PR Interval:    QRS Duration: 90 QT Interval:  382 QTC Calculation: 468 R Axis:   70 Text Interpretation: Sinus rhythm Low voltage, precordial leads Baseline wander in lead(s) V5 Confirmed by Davonna Belling [phone]) on 03/21/2020 12:40:15 PM   Radiology No results found.  Procedures Procedures   Medications Ordered in ED Medications  amLODipine (NORVASC) tablet 5 mg (has no administration in time range)  benztropine (COGENTIN) tablet 0.5 mg (has no administration in time range)  buPROPion (WELLBUTRIN XL) 24 hr tablet 150 mg (has no administration in time range)  glipiZIDE (GLUCOTROL) tablet 5 mg (has no administration in time  range)  Lorayne Bender Sustenna SUSP 156 mg (has no administration in time range)  lisinopril (ZESTRIL) tablet 10 mg (has no administration in time range)  lubiprostone (AMITIZA) capsule 24 mcg (has no administration in time range)  QUEtiapine (SEROQUEL) tablet 100 mg (has no administration in time range)  risperiDONE (RISPERDAL) tablet 0.25 mg (has no administration in time range)  haloperidol lactate (HALDOL) injection 2 mg (5 mg Intramuscular Given 03/21/20 1100)    ED Course  I have reviewed the triage vital signs and the nursing notes.  Pertinent labs & imaging results that were available during my care of the patient were reviewed by me and considered in my medical decision making (see chart for details).    MDM Rules/Calculators/A&P                          70 year old female past medical history of dementia, schizophrenia who presents v with daughter for concerns of increased paranoia and agitation.  Patient currently lives with daughter at this time.  Daughter states she did not take her medications today.  Daughter has noted some increased agitation over the last 2 weeks.  Patient states that she has been taking her meds.  She did not want  to take today because she "wanted to make that decision for herself."  She denies any SI, HI with me.  Patient is able to answer all my questions appropriately and is able to tell me her name, what year it is.  When asked her where she was she said I do not know but then was able to identify that this was a hospital.  Daughter wanted an IVC in place given patient's increased agitation.  At this time, patient is calm, cooperative and expresses no SI, HI, auditory or visual hallucinations.  We will hold off on IVC at this time.  We will have occult clearance and TTS evaluate patient.  We will also consult social work.  Patient became combative and agitated. I discussed with him the need for blood work, urine as well as evaluation.  At this point, patient said  "that's not my daughter, that someone who takes all my money and tries to hurt me."  I asked if there is anybody else we could call and she said no.  She states that she does not want to live with the daughter anymore and that daughter does not want her.  I tried to discuss with patient regarding work-up here in the ED.  At this time, patient became uncooperative, paranoid.  Patient reports that "I am feeling sick because my pills are not dissolving in my body because they are trying to poison me."  Patient could not be redirected at this time. She still denies any SI, HI.  I attempted to de-escalate the situation and discussed with patient but she told me "this is all in your mind."  At this point, patient was not cooperative and there was concern about patient paranoia, CP.  IVC was initiated given my concerns that patient cannot make a clear, coherent decision and my concern that she having some worsening of her paranoid schizophrenia.   CMP shows glucose of 262. Tylenol and Salicylate level are normal. Ethanol is normal. CBC is within normal limits. UA is negative for infectious etiology.   TTS has evaluated patient and feels that patient does not meet criteria for inpatient admission. I feel this is reasonable given that patient is not having SI/HI. She was agitated earlier but has been cooperative.   TOC has been consulted. At this time, patient does not meet immediate placement needs. We will discuss with the daugther.   Discussed with social worker who has discussed with daughter. Daughter is at work and cannot pick patient up. She will pick her up tomorrow.   Patient is resting comfortably at this time. Patient's meds have been ordered.   Larkin Ina (Daughter): [phone] or [phone]  Portions of this note were generated with Dragon dictation software. Dictation errors may occur despite best attempts at proofreading.   Final Clinical Impression(s) / ED Diagnoses Final diagnoses:   Paranoid schizophrenia, chronic condition [facility])    Rx / DC Orders ED Discharge Orders    None       Desma Mcgregor 03/21/20 1847    Davonna Belling, MD 03/22/20 2355

## 2020-03-21 NOTE — ED Notes (Signed)
Pt is refusing to put on ID band.

## 2020-03-22 DIAGNOSIS — F200 Paranoid schizophrenia: Secondary | ICD-10-CM | POA: Diagnosis not present

## 2020-03-22 NOTE — Discharge Instructions (Signed)
Follow-up with your primary care doctor and psychiatrist for further care and treatment as planned and as needed.

## 2020-03-22 NOTE — ED Notes (Signed)
PT agreed to take meds before her Daughter arrived to take her home.

## 2020-03-22 NOTE — ED Notes (Signed)
Daughter called and update given. Daughter will come at 1500 today to pick up PT.

## 2020-03-22 NOTE — ED Notes (Signed)
2nd attempt to have Pt allow staff  To check BP. Pt reported to this writer that I better get the hell out of here because she can not be held against her will. Pt reported she was not no patient.

## 2020-03-22 NOTE — ED Notes (Signed)
Pt refuses to wear a Pt arm band because she is not a PT.

## 2020-03-22 NOTE — ED Notes (Signed)
PT refuses to have  Vitals done.

## 2020-03-22 NOTE — ED Provider Notes (Addendum)
Emergency Medicine Observation Re-evaluation Note  Kristen Colon is a 70 y.o. female, seen on rounds today.  Pt initially presented to the ED for complaints of Paranoid Currently, the patient is alert and interactive.  She is a poor historian.  Physical Exam  BP (!) 150/72   Pulse 76   Temp (!) 97.4 F (36.3 C) (Oral)   Resp 16   LMP  (LMP Unknown) Comment: years ago per Pt  SpO2 99%  Physical Exam General: Elderly Cardiac: Normal heart Lungs: Normal respiratory rate Psych: Delusional and paranoid  ED Course / MDM  EKG:EKG Interpretation  Date/Time:  Wednesday March 21 2020 11:31:00 EST Ventricular Rate:  89 PR Interval:    QRS Duration: 90 QT Interval:  382 QTC Calculation: 468 R Axis:   70 Text Interpretation: Sinus rhythm Low voltage, precordial leads Baseline wander in lead(s) V5 Confirmed by Davonna Belling [phone]) on 03/21/2020 12:40:15 PM    I have reviewed the labs performed to date as well as medications administered while in observation.  Recent changes in the last 24 hours include she has remained stable.  Her daughter was contacted and plans on picking her up at 3:00 today.  Plan  Current plan is for ongoing management by hospice at her home, where she is being followed for ovarian cancer, and dementia. Patient is not under full IVC at this time.   Daleen Bo, MD 03/22/20 1011  3:30 PM-IVC has been rescinded by me.  Awaiting daughter to come and get the patient to take her home.   Daleen Bo, MD 03/22/20 [phone]

## 2020-03-22 NOTE — Progress Notes (Signed)
Manufacturing engineer [facility])  This patient currently receives hospice services in the community.   [facility] will continue to follow for any discharge planning needs and to coordinate continued hospice care.    Thank you for the opportunity to participate in this patient's care.     Domenic Moras, BSN, RN [facility] Liaison [phone]   [phone] (24h on call)

## 2021-07-31 IMAGING — CT CT ANGIO CHEST
2 of 6 series · 18 of 46 positions shown · IV contrast (omnipaque)
Comparison: September 29, 2018

CLINICAL DATA: Tachycardia and elevated D-dimer.

EXAM:
CT ANGIOGRAPHY CHEST WITH CONTRAST
TECHNIQUE: Multidetector CT imaging of the chest was performed using the
standard protocol during bolus administration of intravenous
contrast. Multiplanar CT image reconstructions and MIPs were
obtained to evaluate the vascular anatomy.
CONTRAST:  75mL OMNIPAQUE IOHEXOL 350 MG/ML SOLN

[Series 6: thins · axial · 0.64mm/px · z∈[+1029,+1224]mm · 15 of 215 slices shown]
[im 10/215  lung]
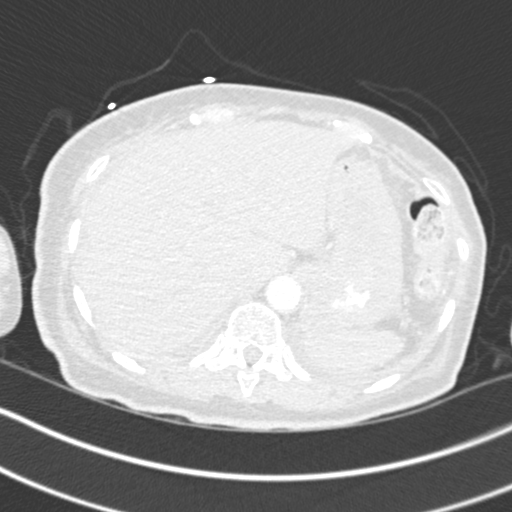
[im 28/215  soft-tissue]
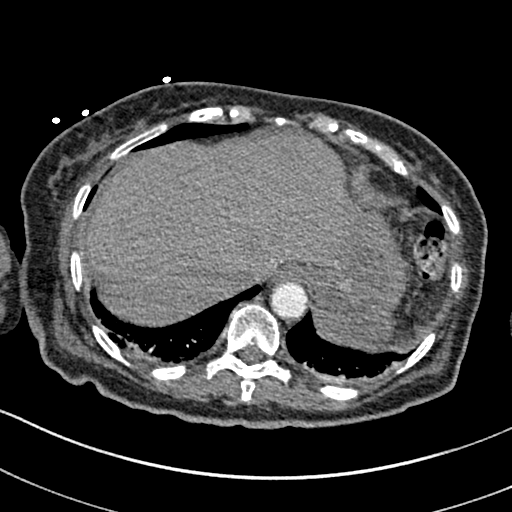
[im 38/215  lung]
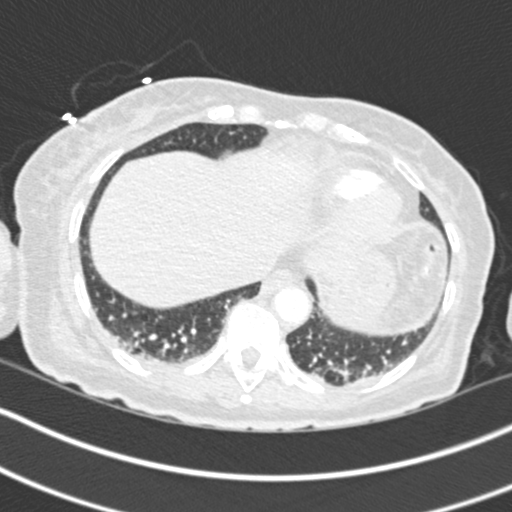
[im 56/215  soft-tissue]
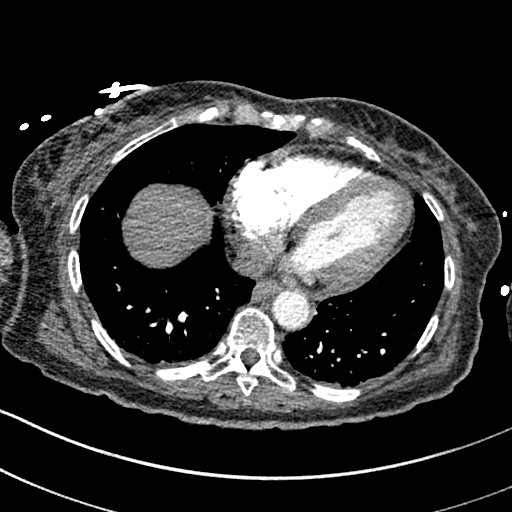
[im 66/215  lung]
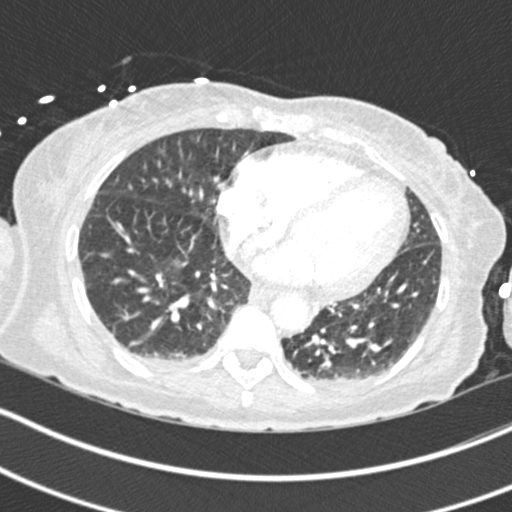
[im 84/215  soft-tissue]
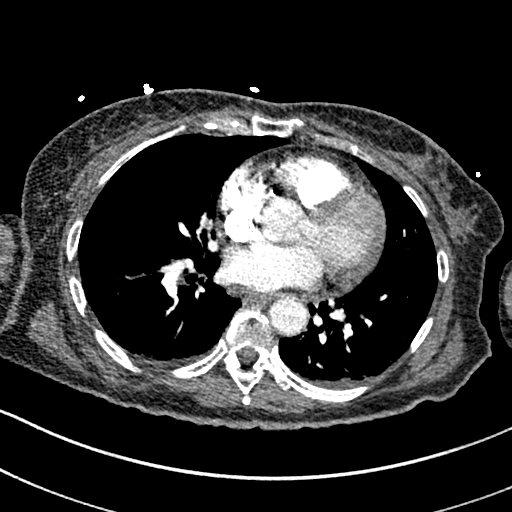
[im 94/215  lung]
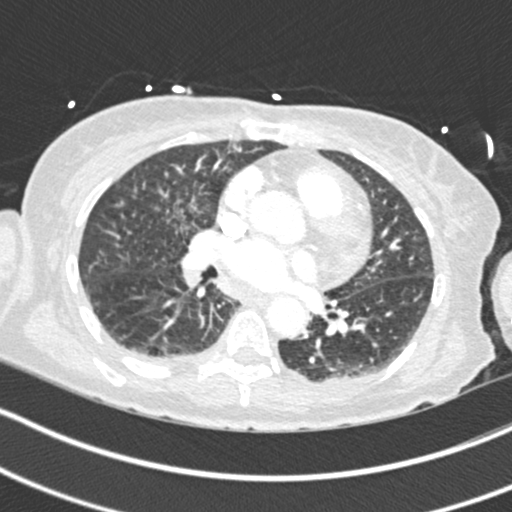
[im 112/215  soft-tissue]
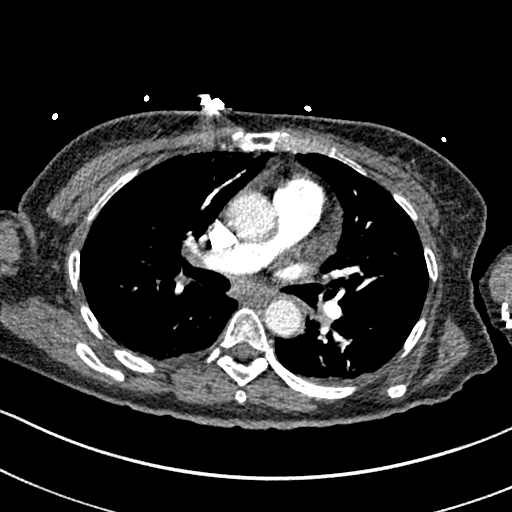
[im 121/215  lung]
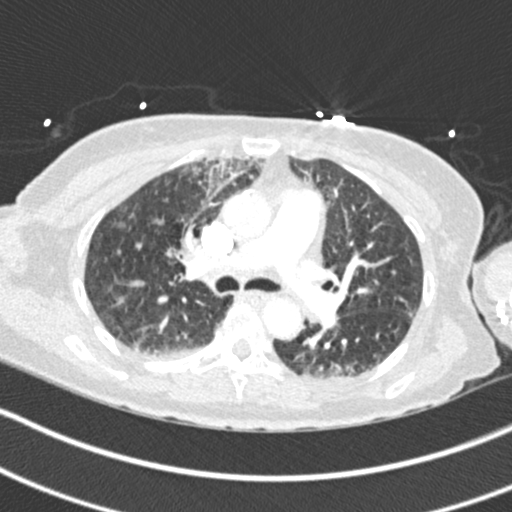
[im 131/215  soft-tissue]
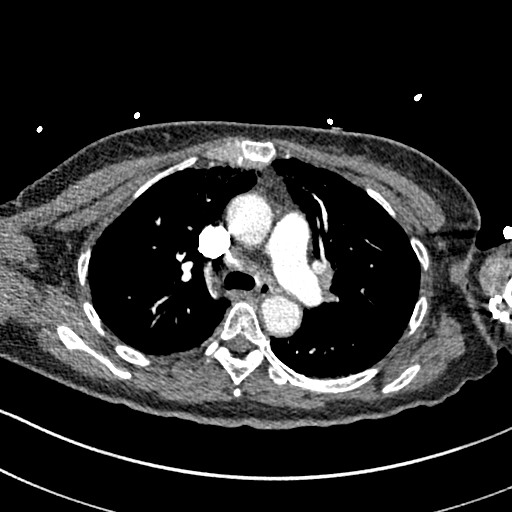
[im 149/215  lung]
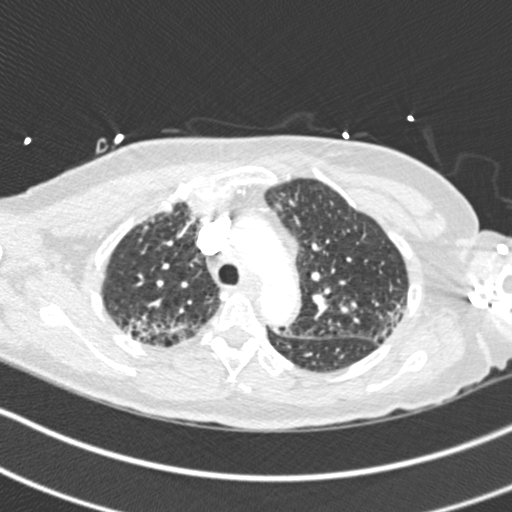
[im 159/215  soft-tissue]
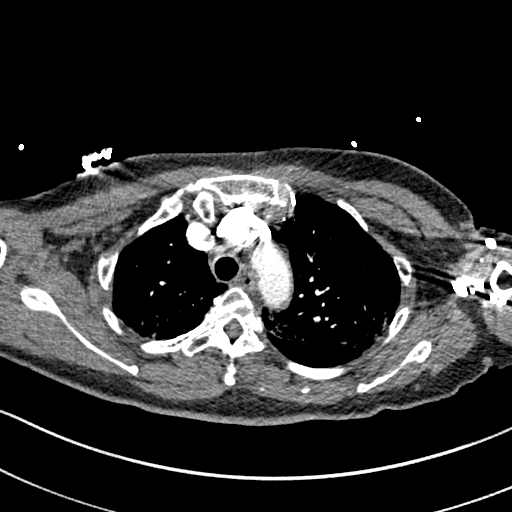
[im 177/215  lung]
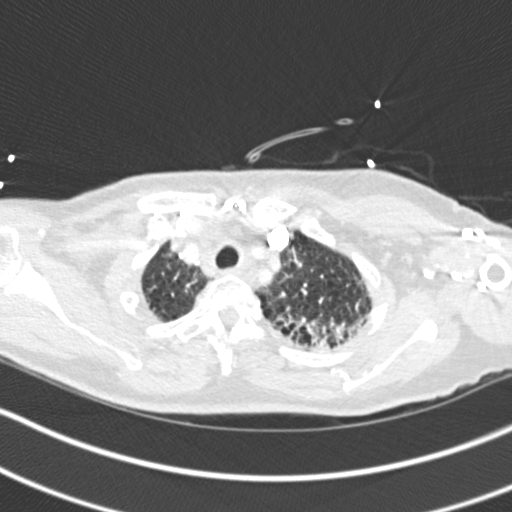
[im 187/215  soft-tissue]
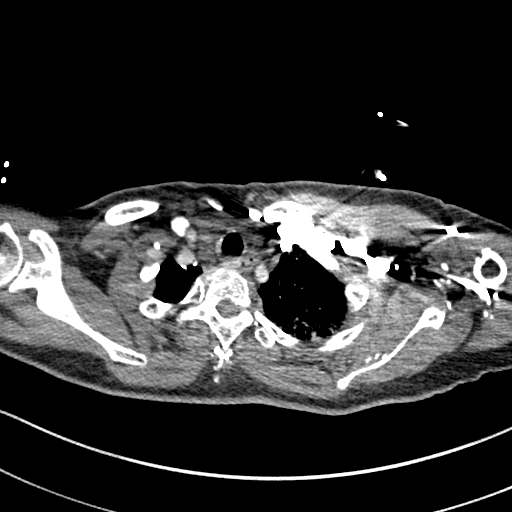
[im 205/215  lung]
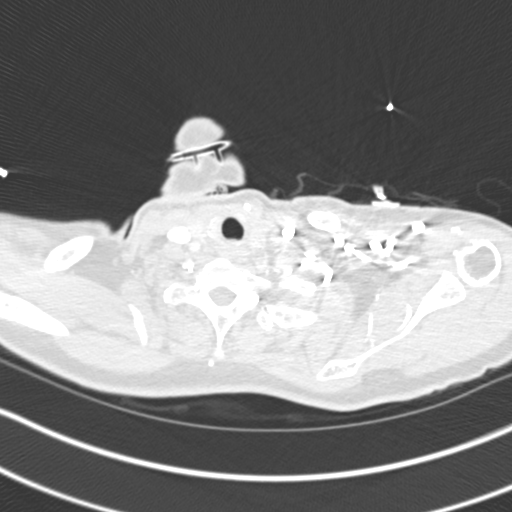

[Series 8: coronal mpr · coronal · 0.44mm/px · 3 of 128 slices shown]
[im 32/128  soft-tissue]
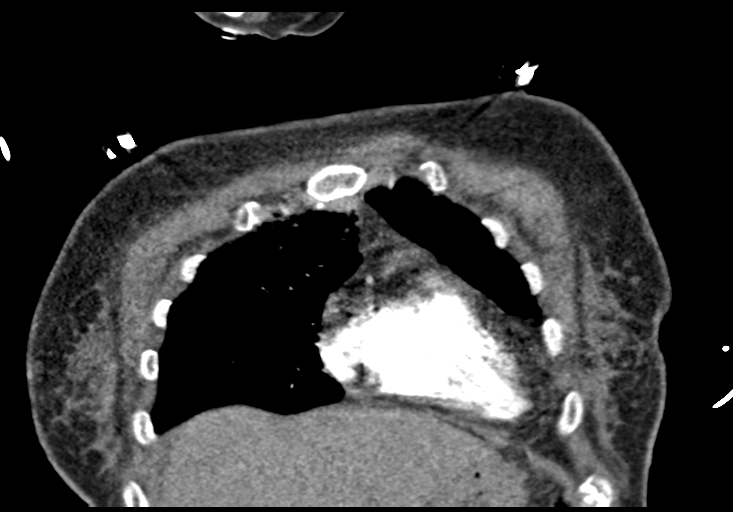
[im 64/128  soft-tissue]
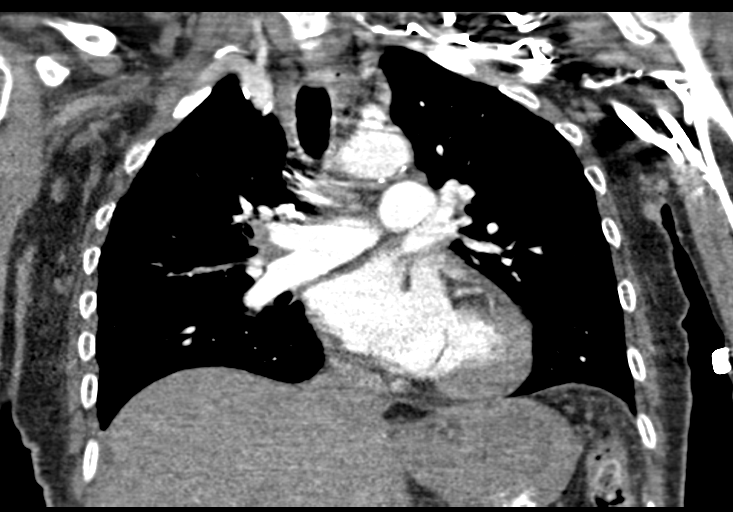
[im 96/128  soft-tissue]
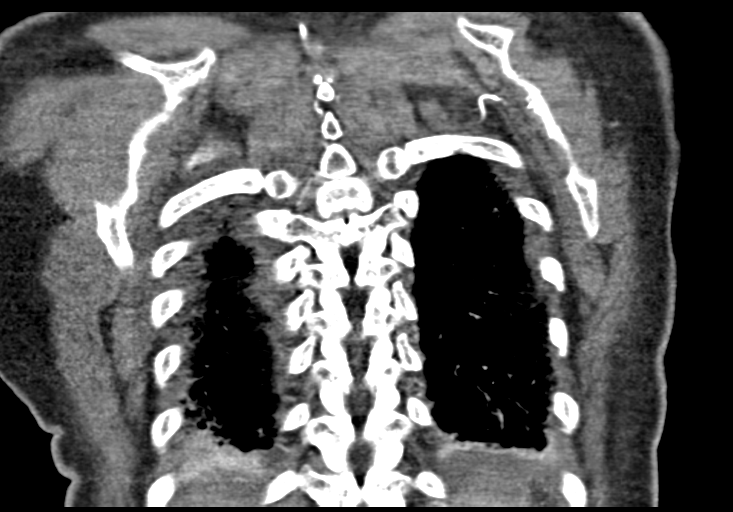

[18 of 46 positions shown; findings below may reference images not displayed]

FINDINGS: Cardiovascular: Satisfactory opacification of the pulmonary arteries
to the segmental level. No evidence of pulmonary embolism. Normal
heart size. No pericardial effusion.

Mediastinum/Nodes: No enlarged mediastinal, hilar, or axillary lymph
nodes. Thyroid gland, trachea, and esophagus demonstrate no
significant findings.

Lungs/Pleura: Mild emphysematous lung disease is seen involving the
posterior aspect of the bilateral upper lobes. Mild atelectatic
changes are also seen within the posterior aspect of the bilateral
upper lobes, anteromedial aspect of the right upper lobe and
posterior aspect of the bilateral lung bases.

There is no evidence of a pleural effusion or pneumothorax.

Upper Abdomen: No acute abnormality.

Musculoskeletal: No chest wall abnormality. No acute or significant
osseous findings.

Review of the MIP images confirms the above findings.
IMPRESSION: 1. No evidence of pulmonary embolus.
2. Mild bilateral upper lobe and bibasilar atelectasis.

Emphysema ([id number]).

## 2021-10-27 ENCOUNTER — Inpatient Hospital Stay (HOSPITAL_COMMUNITY)
Admission: EM | Admit: 2021-10-27 | Discharge: 2021-10-30 | DRG: 683 | Disposition: A | Discharge status: Hospice | Attending: Internal Medicine | Admitting: Internal Medicine

## 2021-10-27 ENCOUNTER — Emergency Department (HOSPITAL_COMMUNITY)

## 2021-10-27 DIAGNOSIS — Z886 Allergy status to analgesic agent status: Secondary | ICD-10-CM

## 2021-10-27 DIAGNOSIS — J432 Centrilobular emphysema: Secondary | ICD-10-CM | POA: Diagnosis present

## 2021-10-27 DIAGNOSIS — I10 Essential (primary) hypertension: Secondary | ICD-10-CM | POA: Diagnosis present

## 2021-10-27 DIAGNOSIS — C7960 Secondary malignant neoplasm of unspecified ovary: Secondary | ICD-10-CM | POA: Diagnosis present

## 2021-10-27 DIAGNOSIS — I959 Hypotension, unspecified: Secondary | ICD-10-CM | POA: Diagnosis present

## 2021-10-27 DIAGNOSIS — R1114 Bilious vomiting: Secondary | ICD-10-CM | POA: Diagnosis present

## 2021-10-27 DIAGNOSIS — R4189 Other symptoms and signs involving cognitive functions and awareness: Secondary | ICD-10-CM | POA: Diagnosis present

## 2021-10-27 DIAGNOSIS — F209 Schizophrenia, unspecified: Secondary | ICD-10-CM | POA: Diagnosis present

## 2021-10-27 DIAGNOSIS — N179 Acute kidney failure, unspecified: Secondary | ICD-10-CM | POA: Diagnosis not present

## 2021-10-27 DIAGNOSIS — Z87891 Personal history of nicotine dependence: Secondary | ICD-10-CM

## 2021-10-27 DIAGNOSIS — R180 Malignant ascites: Secondary | ICD-10-CM | POA: Diagnosis present

## 2021-10-27 DIAGNOSIS — K219 Gastro-esophageal reflux disease without esophagitis: Secondary | ICD-10-CM | POA: Diagnosis present

## 2021-10-27 DIAGNOSIS — Z888 Allergy status to other drugs, medicaments and biological substances status: Secondary | ICD-10-CM

## 2021-10-27 DIAGNOSIS — R531 Weakness: Principal | ICD-10-CM

## 2021-10-27 DIAGNOSIS — J189 Pneumonia, unspecified organism: Secondary | ICD-10-CM

## 2021-10-27 DIAGNOSIS — Z1152 Encounter for screening for COVID-19: Secondary | ICD-10-CM

## 2021-10-27 DIAGNOSIS — Z66 Do not resuscitate: Secondary | ICD-10-CM | POA: Diagnosis present

## 2021-10-27 DIAGNOSIS — R8271 Bacteriuria: Secondary | ICD-10-CM | POA: Diagnosis present

## 2021-10-27 DIAGNOSIS — E119 Type 2 diabetes mellitus without complications: Secondary | ICD-10-CM | POA: Diagnosis present

## 2021-10-27 DIAGNOSIS — J449 Chronic obstructive pulmonary disease, unspecified: Secondary | ICD-10-CM | POA: Diagnosis present

## 2021-10-27 DIAGNOSIS — Z885 Allergy status to narcotic agent status: Secondary | ICD-10-CM

## 2021-10-27 DIAGNOSIS — R0602 Shortness of breath: Secondary | ICD-10-CM | POA: Diagnosis present

## 2021-10-27 DIAGNOSIS — F319 Bipolar disorder, unspecified: Secondary | ICD-10-CM | POA: Diagnosis present

## 2021-10-27 DIAGNOSIS — Z8543 Personal history of malignant neoplasm of ovary: Secondary | ICD-10-CM

## 2021-10-27 DIAGNOSIS — K5903 Drug induced constipation: Secondary | ICD-10-CM | POA: Diagnosis present

## 2021-10-27 DIAGNOSIS — Z79899 Other long term (current) drug therapy: Secondary | ICD-10-CM

## 2021-10-27 DIAGNOSIS — Z880 Allergy status to penicillin: Secondary | ICD-10-CM

## 2021-10-27 LAB — CBC WITH DIFFERENTIAL/PLATELET
Abs Immature Granulocytes: 0.02 10*3/uL (ref 0.00–0.07)
Basophils Absolute: 0 10*3/uL (ref 0.0–0.1)
Basophils Relative: 0 %
Eosinophils Absolute: 0.3 10*3/uL (ref 0.0–0.5)
Eosinophils Relative: 3 %
HCT: 37.3 % (ref 36.0–46.0)
Hemoglobin: 11.9 g/dL — ABNORMAL LOW (ref 12.0–15.0)
Immature Granulocytes: 0 %
Lymphocytes Relative: 20 %
Lymphs Abs: 2 10*3/uL (ref 0.7–4.0)
MCH: 27.5 pg (ref 26.0–34.0)
MCHC: 31.9 g/dL (ref 30.0–36.0)
MCV: 86.1 fL (ref 80.0–100.0)
Monocytes Absolute: 1.1 10*3/uL — ABNORMAL HIGH (ref 0.1–1.0)
Monocytes Relative: 11 %
Neutro Abs: 6.5 10*3/uL (ref 1.7–7.7)
Neutrophils Relative %: 66 %
Platelets: 351 10*3/uL (ref 150–400)
RBC: 4.33 MIL/uL (ref 3.87–5.11)
RDW: 15.9 % — ABNORMAL HIGH (ref 11.5–15.5)
WBC: 10 10*3/uL (ref 4.0–10.5)
nRBC: 0 % (ref 0.0–0.2)

## 2021-10-27 LAB — COMPREHENSIVE METABOLIC PANEL
ALT: 18 U/L (ref 0–44)
AST: 46 U/L — ABNORMAL HIGH (ref 15–41)
Albumin: 3.1 g/dL — ABNORMAL LOW (ref 3.5–5.0)
Alkaline Phosphatase: 53 U/L (ref 38–126)
Anion gap: 11 (ref 5–15)
BUN: 24 mg/dL — ABNORMAL HIGH (ref 8–23)
CO2: 30 mmol/L (ref 22–32)
Calcium: 8.6 mg/dL — ABNORMAL LOW (ref 8.9–10.3)
Chloride: 98 mmol/L (ref 98–111)
Creatinine, Ser: 2.23 mg/dL — ABNORMAL HIGH (ref 0.44–1.00)
GFR, Estimated: 23 mL/min — ABNORMAL LOW (ref >60)
Glucose, Bld: 156 mg/dL — ABNORMAL HIGH (ref 70–99)
Potassium: 4.2 mmol/L (ref 3.5–5.1)
Sodium: 139 mmol/L (ref 135–145)
Total Bilirubin: 0.3 mg/dL (ref 0.3–1.2)
Total Protein: 6.2 g/dL — ABNORMAL LOW (ref 6.5–8.1)

## 2021-10-27 LAB — URINALYSIS, ROUTINE W REFLEX MICROSCOPIC
Bilirubin Urine: NEGATIVE
Glucose, UA: NEGATIVE mg/dL
Ketones, ur: NEGATIVE mg/dL
Nitrite: NEGATIVE
Protein, ur: 30 mg/dL — AB
Specific Gravity, Urine: 1.019 (ref 1.005–1.030)
pH: 5 (ref 5.0–8.0)

## 2021-10-27 LAB — AMMONIA: Ammonia: 28 umol/L (ref 9–35)

## 2021-10-27 LAB — MAGNESIUM: Magnesium: 2.2 mg/dL (ref 1.7–2.4)

## 2021-10-27 MED ORDER — SODIUM CHLORIDE 0.9 % IV SOLN
1.0000 g | Freq: Once | INTRAVENOUS | Status: AC
  Administered 2021-10-27: 1 g via INTRAVENOUS
  Filled 2021-10-27: qty 10

## 2021-10-27 MED ORDER — SODIUM CHLORIDE 0.9 % IV SOLN
500.0000 mg | INTRAVENOUS | Status: DC
  Administered 2021-10-27: 500 mg via INTRAVENOUS
  Filled 2021-10-27: qty 5

## 2021-10-27 MED ORDER — SODIUM CHLORIDE 0.9 % IV BOLUS
1000.0000 mL | Freq: Once | INTRAVENOUS | Status: AC
  Administered 2021-10-27: 1000 mL via INTRAVENOUS

## 2021-10-27 NOTE — ED Notes (Signed)
Pts name was called by registration 3x with no response.

## 2021-10-27 NOTE — ED Triage Notes (Signed)
Pt here from home with c/o weakness,. Pt is on hospice for cancer was just recently in Hospital for same ,

## 2021-10-27 NOTE — ED Provider Notes (Addendum)
[facility] EMERGENCY DEPARTMENT Provider Note   CSN: [id number] Arrival date & time: 10/27/21  1725     History  Chief Complaint  Patient presents with   Weakness    Kristen Colon is a 71 y.o. female.  Pt's daughter reports pt was having abdominal pain earlier today.  She reports pt has not had a bowel movement in several days. She reports she gave pt an enema but she had no relief.  Pt called hospice rn who advised her to give Morphine.  Daughter reports pt had decreased blood pressure after morphine.   She called Hospice who advised her to call EMS to come to the hospital.  Pt recently had a paracentesis at [place].   Pt has a history of ovarian cancer with metastatic disease.  Daughter reports pt was doing well until fluid started building up.    The history is provided by the patient. No language interpreter was used.  Weakness Associated symptoms: abdominal pain        Home Medications Prior to Admission medications   Medication Sig Start Date End Date Taking? Authorizing Provider  acetaminophen (TYLENOL) 325 MG tablet Take 2 tablets (650 mg total) by mouth every 8 (eight) hours as needed for mild pain or moderate pain. 03/02/19   Patrecia Pour, MD  amLODipine (NORVASC) 5 MG tablet Take 5 mg by mouth daily. 05/16/19   [provider]  baclofen (LIORESAL) 10 MG tablet Take 10 mg by mouth daily as needed for muscle spasms.    [provider]  benztropine (COGENTIN) 0.5 MG tablet Take 0.5 mg by mouth daily. 05/20/19   [provider]  buPROPion (WELLBUTRIN XL) 150 MG 24 hr tablet Take 150 mg by mouth daily. 06/10/19   [provider]  diazepam (VALIUM) 5 MG tablet Take 5 mg by mouth every 8 (eight) hours as needed for anxiety.    [provider]  fentaNYL (DURAGESIC) 50 MCG/HR Place 1 patch onto the skin every 3 (three) days. Patient not taking: No sig reported 03/02/19   Patrecia Pour, MD  fentaNYL  (DURAGESIC) 75 MCG/HR Place 1 patch onto the skin every 3 (three) days.    [provider]  glipiZIDE (GLUCOTROL) 5 MG tablet Take 1 tablet (5 mg total) by mouth 2 (two) times daily. 03/02/19 06/13/19  Patrecia Pour, MD  HYDROcodone-acetaminophen (NORCO/VICODIN) 5-325 MG tablet Take 1 tablet by mouth every 6 (six) hours as needed for moderate pain. 03/02/19   Patrecia Pour, MD  INVEGA SUSTENNA 156 MG/ML SUSP injection Inject 1 mL (156 mg total) into the muscle every 30 (thirty) days. 09/05/14   Dorie Rank, MD  linaclotide Rolan Lipa) 290 MCG CAPS capsule Take 1 capsule (290 mcg total) by mouth daily. 04/16/17 06/13/19  Arrien, Jimmy Picket, MD  lisinopril (ZESTRIL) 10 MG tablet Take 10 mg by mouth 2 (two) times daily. 06/10/19   [provider]  lubiprostone (AMITIZA) 24 MCG capsule Take 24 mcg by mouth daily.    [provider]  memantine (NAMENDA) 5 MG tablet Take 1 tablet (5 mg total) by mouth 2 (two) times daily. 03/02/19   Patrecia Pour, MD  metoprolol tartrate (LOPRESSOR) 25 MG tablet Take 0.5 tablets (12.5 mg total) by mouth 2 (two) times daily. 03/02/19   Patrecia Pour, MD  mirtazapine (REMERON) 15 MG tablet Take 1 tablet (15 mg total) by mouth at bedtime. 03/02/19 06/13/19  Patrecia Pour, MD  pantoprazole (PROTONIX) 40 MG tablet Take 40 mg by mouth daily. 06/10/19   [provider]  prochlorperazine (COMPAZINE) 10 MG tablet Take 10 mg by mouth daily.    [provider]  QUEtiapine (SEROQUEL) 100 MG tablet Take 100 mg by mouth 2 (two) times daily. 12/27/18   [provider]  risperiDONE (RISPERDAL) 0.25 MG tablet Take 0.25 mg by mouth daily as needed (anxiety).    [provider]      Allergies    Penicillins, Hydrocodone, 5-alpha reductase inhibitors, and Percocet [oxycodone-acetaminophen]    Review of Systems   Review of Systems  Gastrointestinal:  Positive for abdominal distention and abdominal pain.  Neurological:  Positive for  weakness.  All other systems reviewed and are negative.   Physical Exam Updated Vital Signs BP (!) 104/58   Pulse 94   Temp 98.1 F (36.7 C)   Resp 16   LMP  (LMP Unknown) Comment: years ago per Pt  SpO2 92%  Physical Exam Vitals and nursing note reviewed.  Constitutional:      Appearance: She is well-developed.  HENT:     Head: Normocephalic.  Cardiovascular:     Rate and Rhythm: Normal rate and regular rhythm.  Pulmonary:     Effort: Pulmonary effort is normal.  Abdominal:     General: There is distension.  Musculoskeletal:        General: Normal range of motion.     Cervical back: Normal range of motion. Rigidity present.  Skin:    General: Skin is warm.  Neurological:     General: No focal deficit present.     Mental Status: She is alert.  Psychiatric:        Mood and Affect: Mood normal.     ED Results / Procedures / Treatments   Labs (all labs ordered are listed, but only abnormal results are displayed) Labs Reviewed  URINALYSIS, ROUTINE W REFLEX MICROSCOPIC - Abnormal; Notable for the following components:      Result Value   APPearance HAZY (*)    Hgb urine dipstick SMALL (*)    Protein, ur 30 (*)    Leukocytes,Ua TRACE (*)    Bacteria, UA RARE (*)    All other components within normal limits  CBC WITH DIFFERENTIAL/PLATELET - Abnormal; Notable for the following components:   Hemoglobin 11.9 (*)    RDW 15.9 (*)    Monocytes Absolute 1.1 (*)    All other components within normal limits  COMPREHENSIVE METABOLIC PANEL - Abnormal; Notable for the following components:   Glucose, Bld 156 (*)    BUN 24 (*)    Creatinine, Ser 2.23 (*)    Calcium 8.6 (*)    Total Protein 6.2 (*)    Albumin 3.1 (*)    AST 46 (*)    GFR, Estimated 23 (*)    All other components within normal limits  URINE CULTURE  MAGNESIUM  AMMONIA    EKG EKG Interpretation  Date/Time:  Sunday October 27 2021 19:49:25 EDT Ventricular Rate:  99 PR Interval:  148 QRS  Duration: 74 QT Interval:  328 QTC Calculation: 420 R Axis:   180 Text Interpretation: Normal sinus rhythm Low voltage QRS Lateral infarct , age undetermined Abnormal ECG When compared with ECG of 21-Mar-2020 11:31, PREVIOUS ECG IS PRESENT No significant change since last tracing Confirmed by Isla Pence [phone]) on 10/27/2021 9:41:27 PM  Radiology DG Chest 2 View  Result Date: 10/27/2021 CLINICAL DATA:  Weakness EXAM: CHEST -  2 VIEW COMPARISON:  10/24/2021 FINDINGS: New patchy infiltrate is seen in right lower lung field suggesting pneumonia. There are no signs of alveolar pulmonary edema. There is no pleural effusion or pneumothorax. IMPRESSION: New patchy infiltrate in medial right lower lung fields suggests atelectasis/pneumonia. Electronically Signed   By: Elmer Picker M.D.   On: 10/27/2021 19:51    Procedures Procedures    Medications Ordered in ED Medications - No data to display  ED Course/ Medical Decision Making/ A&P                           Medical Decision Making Pt has ovarian cancer.  Pt had decreased blood pressure at home today   Amount and/or Complexity of Data Reviewed Independent Historian:     Details: Daughter is here with pt.  Pt and daughter do not want cpr or intubation however they do want full medical care,  antibiotics, fluids...  External Data Reviewed: notes. Labs: ordered. Decision-making details documented in ED Course.    Details: Pt has  a bun of 24, creatine of 2.23 Radiology: ordered and independent interpretation performed. Decision-making details documented in ED Course.    Details: New patchy infiltrate   Risk Risk Details: Primary care notes reviewed     MDM:  Iv fluid bolus ordered,  Iv antibiotics.     Hospitalist consulted.   Pt's care turned over to Dr. Leonette Monarch pending hospitalist consult    Final Clinical Impression(s) / ED Diagnoses Final diagnoses:  Weakness  Pneumonia due to infectious organism, unspecified  laterality, unspecified part of lung  AKI (acute kidney injury) ([place])  Metastatic adenocarcinoma of ovary, unspecified laterality [facility])    Rx / DC Orders ED Discharge Orders     None         Fransico Meadow, PA-C 10/27/21 2257    Fransico Meadow, PA-C 10/27/21 2341    Isla Pence, MD 10/31/21 [phone]

## 2021-10-27 NOTE — Hospital Course (Signed)
  71yo metastat  AMS   AKI   Anemia MCV 86

## 2021-10-28 ENCOUNTER — Observation Stay (HOSPITAL_COMMUNITY)

## 2021-10-28 DIAGNOSIS — K219 Gastro-esophageal reflux disease without esophagitis: Secondary | ICD-10-CM | POA: Diagnosis present

## 2021-10-28 DIAGNOSIS — Z87891 Personal history of nicotine dependence: Secondary | ICD-10-CM | POA: Diagnosis not present

## 2021-10-28 DIAGNOSIS — Z1152 Encounter for screening for COVID-19: Secondary | ICD-10-CM | POA: Diagnosis not present

## 2021-10-28 DIAGNOSIS — R180 Malignant ascites: Secondary | ICD-10-CM | POA: Diagnosis present

## 2021-10-28 DIAGNOSIS — K5903 Drug induced constipation: Secondary | ICD-10-CM | POA: Diagnosis present

## 2021-10-28 DIAGNOSIS — Z79899 Other long term (current) drug therapy: Secondary | ICD-10-CM | POA: Diagnosis not present

## 2021-10-28 DIAGNOSIS — I10 Essential (primary) hypertension: Secondary | ICD-10-CM | POA: Diagnosis present

## 2021-10-28 DIAGNOSIS — I959 Hypotension, unspecified: Secondary | ICD-10-CM | POA: Diagnosis present

## 2021-10-28 DIAGNOSIS — R8271 Bacteriuria: Secondary | ICD-10-CM

## 2021-10-28 DIAGNOSIS — Z886 Allergy status to analgesic agent status: Secondary | ICD-10-CM | POA: Diagnosis not present

## 2021-10-28 DIAGNOSIS — E119 Type 2 diabetes mellitus without complications: Secondary | ICD-10-CM | POA: Diagnosis present

## 2021-10-28 DIAGNOSIS — Z885 Allergy status to narcotic agent status: Secondary | ICD-10-CM | POA: Diagnosis not present

## 2021-10-28 DIAGNOSIS — J449 Chronic obstructive pulmonary disease, unspecified: Secondary | ICD-10-CM | POA: Diagnosis present

## 2021-10-28 DIAGNOSIS — R0602 Shortness of breath: Secondary | ICD-10-CM | POA: Diagnosis present

## 2021-10-28 DIAGNOSIS — J432 Centrilobular emphysema: Secondary | ICD-10-CM | POA: Diagnosis present

## 2021-10-28 DIAGNOSIS — Z880 Allergy status to penicillin: Secondary | ICD-10-CM | POA: Diagnosis not present

## 2021-10-28 DIAGNOSIS — Z8543 Personal history of malignant neoplasm of ovary: Secondary | ICD-10-CM | POA: Diagnosis not present

## 2021-10-28 DIAGNOSIS — R4189 Other symptoms and signs involving cognitive functions and awareness: Secondary | ICD-10-CM | POA: Diagnosis present

## 2021-10-28 DIAGNOSIS — Z888 Allergy status to other drugs, medicaments and biological substances status: Secondary | ICD-10-CM | POA: Diagnosis not present

## 2021-10-28 DIAGNOSIS — F209 Schizophrenia, unspecified: Secondary | ICD-10-CM | POA: Diagnosis present

## 2021-10-28 DIAGNOSIS — N179 Acute kidney failure, unspecified: Secondary | ICD-10-CM | POA: Diagnosis present

## 2021-10-28 DIAGNOSIS — Z66 Do not resuscitate: Secondary | ICD-10-CM | POA: Diagnosis present

## 2021-10-28 DIAGNOSIS — F319 Bipolar disorder, unspecified: Secondary | ICD-10-CM | POA: Diagnosis present

## 2021-10-28 DIAGNOSIS — C7960 Secondary malignant neoplasm of unspecified ovary: Secondary | ICD-10-CM | POA: Diagnosis present

## 2021-10-28 LAB — CBC
HCT: 38.5 % (ref 36.0–46.0)
Hemoglobin: 11.9 g/dL — ABNORMAL LOW (ref 12.0–15.0)
MCH: 27.4 pg (ref 26.0–34.0)
MCHC: 30.9 g/dL (ref 30.0–36.0)
MCV: 88.7 fL (ref 80.0–100.0)
Platelets: 334 10*3/uL (ref 150–400)
RBC: 4.34 MIL/uL (ref 3.87–5.11)
RDW: 16 % — ABNORMAL HIGH (ref 11.5–15.5)
WBC: 7.8 10*3/uL (ref 4.0–10.5)
nRBC: 0 % (ref 0.0–0.2)

## 2021-10-28 LAB — BASIC METABOLIC PANEL
Anion gap: 12 (ref 5–15)
BUN: 17 mg/dL (ref 8–23)
CO2: 23 mmol/L (ref 22–32)
Calcium: 8.9 mg/dL (ref 8.9–10.3)
Chloride: 107 mmol/L (ref 98–111)
Creatinine, Ser: 1.17 mg/dL — ABNORMAL HIGH (ref 0.44–1.00)
GFR, Estimated: 50 mL/min — ABNORMAL LOW (ref >60)
Glucose, Bld: 107 mg/dL — ABNORMAL HIGH (ref 70–99)
Potassium: 4.4 mmol/L (ref 3.5–5.1)
Sodium: 142 mmol/L (ref 135–145)

## 2021-10-28 LAB — URINE CULTURE

## 2021-10-28 LAB — PROCALCITONIN: Procalcitonin: <0.1 ng/mL

## 2021-10-28 LAB — SARS CORONAVIRUS 2 BY RT PCR: SARS Coronavirus 2 by RT PCR: NEGATIVE

## 2021-10-28 MED ORDER — RISPERIDONE 0.5 MG PO TABS: 0.5000 mg | ORAL_TABLET | Freq: Two times a day (BID) | ORAL | Status: DC | PRN

## 2021-10-28 MED ORDER — MEMANTINE HCL 10 MG PO TABS
5.0000 mg | ORAL_TABLET | Freq: Two times a day (BID) | ORAL | Status: DC
  Administered 2021-10-28 – 2021-10-30 (×4): 5 mg via ORAL
  Filled 2021-10-28 (×4): qty 1

## 2021-10-28 MED ORDER — ONDANSETRON HCL 4 MG PO TABS: 4.0000 mg | ORAL_TABLET | Freq: Four times a day (QID) | ORAL | Status: DC | PRN

## 2021-10-28 MED ORDER — ACETAMINOPHEN 650 MG RE SUPP: 650.0000 mg | Freq: Four times a day (QID) | RECTAL | Status: DC | PRN

## 2021-10-28 MED ORDER — BENZTROPINE MESYLATE 0.5 MG PO TABS
0.5000 mg | ORAL_TABLET | Freq: Every day | ORAL | Status: DC
  Administered 2021-10-28 – 2021-10-29 (×2): 0.5 mg via ORAL
  Filled 2021-10-28 (×3): qty 1

## 2021-10-28 MED ORDER — ACETAMINOPHEN 325 MG PO TABS: 650.0000 mg | ORAL_TABLET | Freq: Four times a day (QID) | ORAL | Status: DC | PRN

## 2021-10-28 MED ORDER — SENNOSIDES-DOCUSATE SODIUM 8.6-50 MG PO TABS
1.0000 | ORAL_TABLET | Freq: Every day | ORAL | Status: DC
  Administered 2021-10-28 – 2021-10-30 (×3): 1 via ORAL
  Filled 2021-10-28 (×3): qty 1

## 2021-10-28 MED ORDER — BISACODYL 10 MG RE SUPP: 10.0000 mg | Freq: Every day | RECTAL | Status: DC | PRN

## 2021-10-28 MED ORDER — SENNOSIDES-DOCUSATE SODIUM 8.6-50 MG PO TABS: 1.0000 | ORAL_TABLET | Freq: Every evening | ORAL | Status: DC | PRN

## 2021-10-28 MED ORDER — LACTATED RINGERS IV SOLN: INTRAVENOUS | Status: AC

## 2021-10-28 MED ORDER — ONDANSETRON HCL 4 MG/2ML IJ SOLN: 4.0000 mg | Freq: Four times a day (QID) | INTRAMUSCULAR | Status: DC | PRN

## 2021-10-28 MED ORDER — BISACODYL 10 MG RE SUPP
10.0000 mg | Freq: Once | RECTAL | Status: AC
  Administered 2021-10-28: 10 mg via RECTAL
  Filled 2021-10-28: qty 1

## 2021-10-28 MED ORDER — DIAZEPAM 5 MG PO TABS
5.0000 mg | ORAL_TABLET | Freq: Every day | ORAL | Status: DC
  Administered 2021-10-28 – 2021-10-29 (×2): 5 mg via ORAL
  Filled 2021-10-28 (×2): qty 1

## 2021-10-28 MED ORDER — ALBUTEROL SULFATE (2.5 MG/3ML) 0.083% IN NEBU: 2.5000 mg | INHALATION_SOLUTION | Freq: Four times a day (QID) | RESPIRATORY_TRACT | Status: DC | PRN

## 2021-10-28 MED ORDER — BUPROPION HCL ER (XL) 150 MG PO TB24
150.0000 mg | ORAL_TABLET | Freq: Every day | ORAL | Status: DC
  Administered 2021-10-29 – 2021-10-30 (×2): 150 mg via ORAL
  Filled 2021-10-28 (×2): qty 1

## 2021-10-28 MED ORDER — ENOXAPARIN SODIUM 30 MG/0.3ML IJ SOSY
30.0000 mg | PREFILLED_SYRINGE | Freq: Every day | INTRAMUSCULAR | Status: DC
  Administered 2021-10-28 – 2021-10-29 (×2): 30 mg via SUBCUTANEOUS
  Filled 2021-10-28 (×3): qty 0.3

## 2021-10-28 MED ORDER — HYDROMORPHONE HCL 1 MG/ML IJ SOLN: 0.5000 mg | Freq: Four times a day (QID) | INTRAMUSCULAR | Status: DC | PRN

## 2021-10-28 MED ORDER — MIRTAZAPINE 15 MG PO TABS
15.0000 mg | ORAL_TABLET | Freq: Every day | ORAL | Status: DC
  Administered 2021-10-28 – 2021-10-29 (×2): 15 mg via ORAL
  Filled 2021-10-28 (×2): qty 1

## 2021-10-28 NOTE — H&P (Addendum)
Date: 10/28/2021               Patient Name:  Kristen Colon MRN: [id number]  DOB: 1950-07-09 Age / Sex: 71 y.o., female   PCP: Everardo Beals, NP         Medical Service: Internal Medicine Teaching Service         Attending Physician: : Dr. Jimmye Norman    First Contact: Drucie Opitz, MD      Pager: [phone]      Second Contact: Rick Duff, MD      Pager: [phone]           After Hours (After 5p/  First Contact Pager: [phone]  weekends / holidays): Second Contact Pager: [phone]   SUBJECTIVE   Chief Complaint: sob and abdominal pain  History of Present Illness:   Kristen Colon is a 71yo woman living with a history of metastatic ovarian carcinoma c/b malignant ascites on hospice care, schizophrenia, HTN, DM, presenting to the ED complaining of sob and abdominal pain  Patient is only oriented to self at baseline and is able to communicate how she feels. The majority of this information was obtained from interviewing patient's daughter, Kristen Colon, and chart review.  Patient was in her usual state of health until about ~1 week ago when she began experiencing worsening abdominal pain after which she got a CT abdomen that revealed disease progression, large volume ascites and elevation of her diaphragm. Pt underwent 5L removal, improving her dyspnea. Patient was discharged home. Her abdominal pain has remained unchanged.  Kristen Colon reports that her mother had been having episodes of n/v that started prior to the paracentesis. She reports just in the past 48 hrs, pt has had multiple episodes of bilious emesis and has not been able to have PO intake. Her last meal was at the hospital ~3 days ago. She tried to give patient water and Ensure but patient has not tolerated. She denied fevers, chills, runny nose, sore throat, but endorsed a new cough with some phlegm. No purulent sputum or bloody secretions.  Patient's abdominal pain has been unchanged.  Kristen Colon  communicated this to Hospice and instructed her to give pt morphine. Patient became hypotensive and worsened her SOB, so daughter called EMS.  Of note, Kristen Colon reports patient has not had a bowel movement in ~6 days. This morning daughter and hospice nurse tried a soap sud enema and manual bowel disimpaction, with some relief. Patient is still feeling full and uncomfortable.   Daughter would like to get mom's pain and constipation under control.   Patient currently denies chest pain, sob, blood per mouth or rectum.    ED Course: +SOB and abdominal pain, with constipation. No leukocytosis. Cr. 2.23, CXR new patchy infiltrate in the medial RLL. Patient received IV fluids and IV ceftriaxone and azithromycin. Patient was admitted to IMTS for further evaluation and management.  Meds:  Current Meds  Medication Sig   albuterol (PROVENTIL) (2.5 MG/3ML) 0.083% nebulizer solution Take 2.5 mg by nebulization every 6 (six) hours as needed for wheezing or shortness of breath.   amLODipine (NORVASC) 5 MG tablet Take 5 mg by mouth daily.   baclofen (LIORESAL) 10 MG tablet Take 10 mg by mouth daily as needed for muscle spasms.   benzonatate (TESSALON) 200 MG capsule Take 200 mg by mouth at bedtime.   benztropine (COGENTIN) 0.5 MG tablet Take 0.5 mg by mouth at bedtime.   buPROPion (WELLBUTRIN XL) 150 MG 24 hr tablet Take  150 mg by mouth daily.   diazepam (VALIUM) 5 MG tablet Take 5 mg by mouth at bedtime.   fentaNYL (DURAGESIC) 75 MCG/HR Place 1 patch onto the skin every 3 (three) days.   glipiZIDE (GLUCOTROL) 5 MG tablet Take 5 mg by mouth 2 (two) times daily before a meal.   HYDROcodone-acetaminophen (NORCO/VICODIN) 5-325 MG tablet Take 1 tablet by mouth every 6 (six) hours as needed for moderate pain. (Patient taking differently: Take 1 tablet by mouth every other day.)   INVEGA SUSTENNA 234 MG/1.5ML injection Inject 234 mg into the muscle every 30 (thirty) days.   linaclotide (LINZESS) 290 MCG CAPS  capsule Take 290 mcg by mouth daily before breakfast.   lisinopril (ZESTRIL) 10 MG tablet Take 10 mg by mouth 2 (two) times daily.   lubiprostone (AMITIZA) 24 MCG capsule Take 24 mcg by mouth daily.   memantine (NAMENDA) 5 MG tablet Take 1 tablet (5 mg total) by mouth 2 (two) times daily.   metoCLOPramide (REGLAN) 10 MG tablet Take 10 mg by mouth 3 (three) times daily.   metoprolol tartrate (LOPRESSOR) 25 MG tablet Take 0.5 tablets (12.5 mg total) by mouth 2 (two) times daily.   mirtazapine (REMERON) 15 MG tablet Take 15 mg by mouth at bedtime.   omeprazole (PRILOSEC) 20 MG capsule Take 20 mg by mouth daily.   polyethylene glycol powder (GLYCOLAX/MIRALAX) 17 GM/SCOOP powder Take 0.5 Containers by mouth daily as needed for moderate constipation.   risperiDONE (RISPERDAL) 0.5 MG tablet Take 0.5 mg by mouth 2 (two) times daily as needed (bipolar).   traMADol (ULTRAM) 50 MG tablet Take 50 mg by mouth every 6 (six) hours.   VENTOLIN HFA 108 (90 Base) MCG/ACT inhaler Inhale 2 puffs into the lungs every 6 (six) hours as needed for wheezing or shortness of breath.    Past Medical History  Past Surgical History:  Procedure Laterality Date   DIAGNOSTIC LAPAROSCOPY     pelvic mass Dr. Denman George 06-30-17   LAPAROSCOPY N/A 06/30/2017   Procedure: LAPAROSCOPY DIAGNOSTIC WITH BIOPSIES;  Surgeon: Everitt Amber, MD;  Location: [facility];  Service: Gynecology;  Laterality: N/A;    Social:  Lives with her daughter, who monitors her with the help of family members. Patient is supervised with webcams at their hourse. Hospice nurse checks frequently to assess patient's needs. Support: Daughter and family Level of Function: independent with ADLs with frequent redirection, dependent for iADLs PCP: Everardo Beals, NP Substances: hx of polysubtance use, none currently. Former smoker, marijuana and cocaine use.  Family History: Not obtained  Allergies: Allergies as of 10/27/2021 - Review Complete 03/22/2020   Allergen Reaction Noted   Penicillins Swelling and Other (See Comments) 12/23/2018   Hydrocodone Itching    5-alpha reductase inhibitors Other (See Comments) 01/28/2019   Percocet [oxycodone-acetaminophen] Itching 12/24/2018    Review of Systems: A complete ROS was negative except as per HPI.   OBJECTIVE:   Physical Exam: Vitals:   10/28/21 0300 10/28/21 0306  BP: 108/66   Pulse: 90   Resp: 14   Temp:  98.9 F (37.2 C)  SpO2: 94%   0.5L O2  Constitutional: Ill-appearing woman laying in bed, in no acute distress HENT: normocephalic atraumatic, mucous membranes moist Cardiovascular: regular rate and rhythm, no m/r/g, No JVD. Radial and DP pulses were 1+ bilateral and symmetric Pulmonary/Chest: normal work of breathing on room air, decreased breath sounds on the R lung base, otherwise clear to auscultation. No crackles  Abdominal: Distended, mild tenderness  of palpation, no guarding or rebound tenderness. No fluid wave. No asterixis Neurological: alert & oriented to self, able to answer questions about her symptoms appropriately. Able to recognize daughter and express wishes. Lack of insight about medical condition. MSK: no gross abnormalities. No pitting edema Skin: dry and cool to the touch. Capillary refill in upper extremities 2s. Unable to assess in lower extremities in setting of nail varnish. Decreased skin turgor.  Psych: Pleasant mood and affect  Labs: CBC    Component Value Date/Time   WBC 10.0 10/27/2021 1907   RBC 4.33 10/27/2021 1907   HGB 11.9 (L) 10/27/2021 1907   HCT 37.3 10/27/2021 1907   PLT 351 10/27/2021 1907   MCV 86.1 10/27/2021 1907   MCH 27.5 10/27/2021 1907   MCHC 31.9 10/27/2021 1907   RDW 15.9 (H) 10/27/2021 1907   LYMPHSABS 2.0 10/27/2021 1907   MONOABS 1.1 (H) 10/27/2021 1907   EOSABS 0.3 10/27/2021 1907   BASOSABS 0.0 10/27/2021 1907     CMP     Component Value Date/Time   NA 139 10/27/2021 1907   K 4.2 10/27/2021 1907   CL 98  10/27/2021 1907   CO2 30 10/27/2021 1907   GLUCOSE 156 (H) 10/27/2021 1907   BUN 24 (H) 10/27/2021 1907   CREATININE 2.23 (H) 10/27/2021 1907   CALCIUM 8.6 (L) 10/27/2021 1907   PROT 6.2 (L) 10/27/2021 1907   ALBUMIN 3.1 (L) 10/27/2021 1907   AST 46 (H) 10/27/2021 1907   ALT 18 10/27/2021 1907   ALKPHOS 53 10/27/2021 1907   BILITOT 0.3 10/27/2021 1907   GFRNONAA 23 (L) 10/27/2021 1907   GFRAA >60 06/13/2019 0241    Imaging: DG Chest 2 View  Result Date: 10/27/2021 CLINICAL DATA:  Weakness EXAM: CHEST - 2 VIEW COMPARISON:  10/24/2021 FINDINGS: New patchy infiltrate is seen in right lower lung field suggesting pneumonia. There are no signs of alveolar pulmonary edema. There is no pleural effusion or pneumothorax. IMPRESSION: New patchy infiltrate in medial right lower lung fields suggests atelectasis/pneumonia. Electronically Signed   By: Elmer Picker M.D.   On: 10/27/2021 19:51      EKG: personally reviewed my interpretation is sinus tachycardia, right axis deviation, no evidence of arrhythmia or new ischemic changes. R axis deviation not seen in prior EKG in 03/2020.   ASSESSMENT & PLAN:   Assessment & Plan by Problem: Principal Problem:   AKI (acute kidney injury) ([place])  Kristen Colon is a 71yo woman living with a history of metastatic ovarian carcinoma c/b malignant ascites on hospice care, schizophrenia, HTN, DM, presenting to the ED complaining of sob and abdominal painand admitted for AKI and worsening SOB and abdominal pain on hospital day 0  AKI Asymptomatic bacteriuria On admission, Cr. 2.23. 10/24/2021 was Cr 0.73. U/A showed trace leukocytes with hyaline casts; patient is asymptomatic. Suspect pre-renal AKI in the setting of recurrent emesis in the past 48 hrs and poor po intake in the past 4 days. Patient is s/p 1L LR. BP is normalized since fluid resuscitation. Not concerned about HRS at this time; however, continue to monitor renal function -S/p 1L  LR -100cc/HR maintenance fluid -PRN zofran IV -CTM BMP  Metastatic ovarian Cancer, 2016 Malignant Ascites Abdominal Pain Dx in 2016, s/p letrozole in 2019 and Tamoxifen in 2021, then transition to [facility] for hospice. 10/3 CT abdomen at OSH with progression of disease, enlargement of adnexal masses, and large ascites s/p therapeutic paracentesis after new dyspnea and presyncopal episode on  10/6. Patient currently afebrile, without leukocytosis, there is no signs of acute abdomen at this time. However, patient with constant abdominal pain likely in the setting of progression of disease, worsened by constipation. Given worsening n/v, poor po tolerance, and constipation hx, will obtain KUB to rule out SBO. Currently has normal absolute neutrophil count--will order soap sud enema and dulcolax suppository. -100cc/HR maintenance fluids -IV dilaudid 0.5 q6HR -IV Zofran -abdominal XR, pending -Soap suds enema x1, ordered -Dulcolase suppository PRN  Dyspnea ?aspiration pneumonia vs pneumonitis vs atelectasis Patient presenting with new shortness of breath, now improving on 0.5L of O2 s/p 1x ceftriaxone and azithromycin in ED. CXR with a new infiltrate in medial RLL concerning for pneumonia vs atelectasis vs aspiration pneumonitis in the setting of multiple episodes of emesis. However, suspect that her malignant ascites is also contributing to sob. Patient is afebrile, cbc without leukocytosis, and O2 requirement is improving, decreasing my suspicion for PNA at this time. Follow up procalcitonin, cbc, and CTM clinical status.Consider re-starting empiric treatment for aspiration pneumonia should infection is suspected -CTM vitals and cbc -follow up procalcitonin -Continue in 0.5L O2 for comfort -Incentive spirometer  Schizophrenia Bipolar disorder Cognitive impairment Followed by psychiatry. Chronic, stable on Invega sustenna (paliperidone) monthly, Risperidone 0.5 mg BID PRN, Trazadone 50 mg  qhs,  diazepam 5 mg qhs, benztropine 0.5 mg QHS, Wellbutrin 150 mg, daily, memantine 5 mg. -Delirium precautions  HTN Chronic, stable. On home Metoprolol '25mg'$  and amlodipine '5mg'$ . -Continue to hold home regimen in setting of low BP  COPD Former smoker with evidence of centrilobular emphysema on CT chest. No recent PFTs. Not on home O2 therapy. Low suspicion for acute exacerbation at this time -Continue albuterol duonebs q6HR PRN -Continue albuterol inhaler PRN  GERD - resume home omeprazole 20 mg when able to tolerate PO   Diet: NPO VTE: Enoxaparin Code: DNR  Prior to Admission Living Arrangement: Home, living daughter Anticipated Discharge Location:  pending evaluation Barriers to Discharge: Clinical improvement  Dispo: Admit patient to Observation with expected length of stay less than 2 midnights.  Signed:   Romana Juniper, MD Internal Medicine Resident PGY-1 10/28/2021, 3:30 AM

## 2021-10-28 NOTE — Progress Notes (Signed)
[facility] ED [facility]) Liaison Note Hospitalized Patient   Kristen Colon, current [facility] hospice at home patient, came to [facility] ED w/ complaints of weakness.  Patient's terminal diagnosis is metastatic ovarian cancer with malignant ascites.  Patient was recently discharged from [facility] w/ shortness of breath s/p Paracentesis yielding 5L.  Upon ED evaluation CXR showed new patchy infiltrate suggesting Pneumonia.  Palliative and attending MD at last admission talked with family about placement of Pleurx drain, but family deferred at that time.  Per Dr. Cherie Ouch, [facility] MD, this is a related hospital admission.    Upon arrival, patient was sleeping.  Called and spoke with daughter, [name].  No needs or concerns addressed at this time.  Informed daughter we would be following patient throughout hospital stay.    Patient Is NPO, receiving IVF, IV antibiotics, IV pain management; making inpatient appropriate.   VS 98.1 oral 104/65 HR 81 RR 11 95%  Labs Cr 1.17 GFR 50 Hgb 11.9  Diagnostics CXR: new patchy inflitrate in medial right lower fields suggesting atelectasis/pneumonia.   Abd XR: non obstructive bowel gas pattern   IV Medications: Lactated Ringers @ 160m/h continuous Azithromycin IVPB '500mg'$  QD  Problems List:  Pneumonia - patient c/o weakness, CXR indicating pneumonia.  Currently receiving IV antibiotics. AKI - Cr 2.23 on admission, baseline appears to be around 0.7, receiving LR '@100mL'$ /h.  Abdominal pain - Abd XR showed normal bowel gas pattern.  Soaps Suds enema ordered.  No BM x 6 days.   Metastatic Ovarian Cancer: no current treatment, under hospice services.    Goals of Care:  Treat pneumonia and discharge back home with hospice   Family Contact: LVaughan Basta([phone] Report exchanged with hospital staff.    Hospice IDT updated.    Anticipated discharge date pending  Medication list and transfer summary placed in pt chart.     Thank you for allowing [facility] care for this patient.    Please call [facility] for transport at time of discharge as [facility].    EKenna GilbertBSN, RN  [facility] Hospital[facility]iaison RN  [phone]

## 2021-10-28 NOTE — Progress Notes (Signed)
[facility])   Patient is an active [facility] hospice patient followed at home.    Patient is DNR.    Please reach out with any questions.  Kenna Gilbert BSN, RN  [facility] Nurse Liaison  [phone]

## 2021-10-28 NOTE — Progress Notes (Signed)
NEW ADMISSION NOTE New Admission Note:   Arrival Method: ED stretcher Mental Orientation: AAox1 Telemetry: Assessment: Completed Skin: To be completed IV: RFA Pain: 0/10 Tubes: n/a Safety Measures: Safety Fall Prevention Plan has been given, discussed and signed Admission: Completed [facility] [place] Orientation: Patient has been orientated to the room, unit and staff.  Family:none at bedside  Orders have been reviewed and implemented. Will continue to monitor the patient. Call light has been placed within reach and bed alarm has been activated.   Vira Agar, RN

## 2021-10-28 NOTE — ED Notes (Signed)
Warm blankets provided.

## 2021-10-28 NOTE — Evaluation (Signed)
Clinical/Bedside Swallow Evaluation Patient Details  Name: Kristen Colon MRN: [id number] Date of Birth: 1950/08/14  Today's Date: 10/28/2021 Time: SLP Start Time (ACUTE ONLY): 1700 SLP Stop Time (ACUTE ONLY): 1713 SLP Time Calculation (min) (ACUTE ONLY): 13 min  Past Medical History:  Past Medical History:  Diagnosis Date   Anxiety    Dementia ([place])    Dyspnea    Headache    Hypertension    Schizophrenia ([place])    paranoid   Past Surgical History:  Past Surgical History:  Procedure Laterality Date   DIAGNOSTIC LAPAROSCOPY     pelvic mass Dr. Denman George 06-30-17   LAPAROSCOPY N/A 06/30/2017   Procedure: LAPAROSCOPY DIAGNOSTIC WITH BIOPSIES;  Surgeon: Everitt Amber, MD;  Location: [facility];  Service: Gynecology;  Laterality: N/A;   HPI:  Pt is a 71 yo female currently receiving hospice care, presenting 10/8 with shortness of breath and abdominal pain with N/V. She is admitted with AKI and concern for PNA given new infiltrate in the medial RLL. BSE in February 2021 noted prolonged oral preparation and ill-fitting dentures. She did need pureed foods briefly, but advanced to Dys 2 diet and thin liquids. PMH includes: metastatic ovarian carcinoma c/b malignant ascites, schizophrenia, HTN, DM, dementia, anxiety    Assessment / Plan / Recommendation  Clinical Impression  Pt's oropharyngeal swallow appears to be grossly functional considering edentulous status. She does not have dentures in her belongings bag and she is not sure if they came to the hospital with her. Note that in the past her dentures were ill-fitting and she was eating with dentures at that time, but she says that she has been using them for all meals at home. Pt did have mild difficulty with a piece of graham cracker, appearing to not fully masticate before swallowing (based on time spent in oral cavity) and immediately having c/o it getting stuck in her chest. She points to her sternum and began to have a lot of belching. Pt was  encouraged to take small sips of water and even bites of puree with no regurgitation noted and pt reoprted that the feeling of the cracker being stuck slowly started to subside. Recommend starting with Dys 2 diet (finely chopped) and thin liquids based on current presentation. If family is able to bring in her dentures, advancement could be considered. SLP will f/u at least briefly as concern for aspiration is present but felt to be largely post-prandial in nature given recent emesis and c/o more esophageal symptoms today. SLP Visit Diagnosis: Dysphagia, unspecified (R13.10)    Aspiration Risk  Mild aspiration risk    Diet Recommendation Dysphagia 2 (Fine chop);Thin liquid   Liquid Administration via: Cup;Straw Medication Administration: Whole meds with liquid (may want to crush larger pills) Supervision: Patient able to self feed;Intermittent supervision to cue for compensatory strategies;Comment (set-up assist for meals) Compensations: Minimize environmental distractions;Slow rate;Small sips/bites;Follow solids with liquid Postural Changes: Seated upright at 90 degrees;Remain upright for at least 30 minutes after po intake    Other  Recommendations Oral Care Recommendations: Oral care BID    Recommendations for follow up therapy are one component of a multi-disciplinary discharge planning process, led by the attending physician.  Recommendations may be updated based on patient status, additional functional criteria and insurance authorization.  Follow up Recommendations No SLP follow up      Assistance Recommended at Discharge PRN  Functional Status Assessment Patient has had a recent decline in their functional status and demonstrates the ability to  make significant improvements in function in a reasonable and predictable amount of time.  Frequency and Duration min 2x/week  1 week       Prognosis Prognosis for Safe Diet Advancement: Good      Swallow Study   General HPI: Pt is a  71 yo female currently receiving hospice care, presenting 10/8 with shortness of breath and abdominal pain with N/V. She is admitted with AKI and concern for PNA given new infiltrate in the medial RLL. BSE in February 2021 noted prolonged oral preparation and ill-fitting dentures. She did need pureed foods briefly, but advanced to Dys 2 diet and thin liquids. PMH includes: metastatic ovarian carcinoma c/b malignant ascites, schizophrenia, HTN, DM, dementia, anxiety Type of Study: Bedside Swallow Evaluation Previous Swallow Assessment: see HPI Diet Prior to this Study: NPO Temperature Spikes Noted: No Respiratory Status: Room air History of Recent Intubation: No Behavior/Cognition: Alert;Cooperative;Pleasant mood Oral Cavity Assessment: Within Functional Limits Oral Care Completed by SLP: No Oral Cavity - Dentition: Edentulous;Dentures, not available Vision: Functional for self-feeding Self-Feeding Abilities: Able to feed self Patient Positioning: Upright in bed Baseline Vocal Quality: Normal Volitional Cough: Strong Volitional Swallow: Able to elicit    Oral/Motor/Sensory Function Overall Oral Motor/Sensory Function: Within functional limits   Ice Chips Ice chips: Not tested   Thin Liquid Thin Liquid: Within functional limits Presentation: Cup;Self Fed;Straw    Nectar Thick Nectar Thick Liquid: Not tested   Honey Thick Honey Thick Liquid: Not tested   Puree Puree: Within functional limits Presentation: Self Fed;Spoon   Solid     Solid: Impaired Presentation: Self Fed Oral Phase Impairments: Impaired mastication Other Comments: pt reporting that it got stuck in her chest      Osie Bond., M.A. [place] Office [phone]  Secure chat preferred  10/28/2021,5:27 PM

## 2021-10-28 NOTE — Progress Notes (Signed)
Subjective:   Summary: Kristen Colon is a 71 y.o. year old female currently admitted on the [facility] HD#0 for shortness of breath and abdominal pain.  Overnight Events: NOE   Pt was seen bedside in the AM. She was drowsy and was unable to communicate with [place]. She was able to open her eyes, and speak slightly, but no more than that. Per daughter, this is patient's baseline.   Objective:  Vital signs in last 24 hours: Vitals:   10/28/21 0900 10/28/21 1000 10/28/21 1247 10/28/21 1304  BP: (!) 127/59 104/65 113/69   Pulse: 74 81 80   Resp: '19 11 12   '$ Temp: 98.1 F (36.7 C)   98 F (36.7 C)  TempSrc:      SpO2: 98% 95% 92%    Supplemental O2: Room Air SpO2: 92 %   Physical Exam:  Constitutional: Ill-appearing woman laying in bed, in no acute distress Cardiovascular: RRR, no murmurs, rubs or gallops Pulmonary/Chest: normal work of breathing on room air, lungs clear to auscultation bilaterally Abdominal: Distended abdomen , no tenderness to palpation, no fluid wave, shifting dullness present Skin: warm and dry Extremities: upper/lower extremity pulses 2+, no lower extremity edema present  There were no vitals filed for this visit.   Intake/Output Summary (Last 24 hours) at 10/28/2021 1356 Last data filed at 10/28/2021 0111 Gross per 24 hour  Intake 1100.09 ml  Output --  Net 1100.09 ml   Net IO Since Admission: 1,100.09 mL [10/28/21 1356]  Pertinent Labs:    Latest Ref Rng & Units 10/28/2021    3:10 AM 10/27/2021    7:07 PM 03/21/2020   11:35 AM  CBC  WBC 4.0 - 10.5 K/uL 7.8  10.0  7.5   Hemoglobin 12.0 - 15.0 g/dL 11.9  11.9  13.5   Hematocrit 36.0 - 46.0 % 38.5  37.3  40.5   Platelets 150 - 400 K/uL 334  351  311        Latest Ref Rng & Units 10/28/2021    3:10 AM 10/27/2021    7:07 PM 03/21/2020   11:35 AM  CMP  Glucose 70 - 99 mg/dL 107  156  262   BUN 8 - 23 mg/dL '17  24  9   '$ Creatinine 0.44 - 1.00 mg/dL 1.17  2.23  0.74   Sodium 135 - 145  mmol/L 142  139  139   Potassium 3.5 - 5.1 mmol/L 4.4  4.2  3.6   Chloride 98 - 111 mmol/L 107  98  105   CO2 22 - 32 mmol/L '23  30  20   '$ Calcium 8.9 - 10.3 mg/dL 8.9  8.6  9.8   Total Protein 6.5 - 8.1 g/dL  6.2  7.4   Total Bilirubin 0.3 - 1.2 mg/dL  0.3  0.4   Alkaline Phos 38 - 126 U/L  53  69   AST 15 - 41 U/L  46  30   ALT 0 - 44 U/L  18  23      Imaging: Abd 1 View (KUB)  Result Date: 10/28/2021 CLINICAL DATA:  Abdominal pain EXAM: ABDOMEN - 1 VIEW COMPARISON:  None Available. FINDINGS: The examination is slightly limited by underpenetration. The bowel gas pattern is normal. Probable vascular calcifications noted within the pelvis bilaterally. No radio-opaque calculi or other significant radiographic abnormality are seen. IMPRESSION: Nonobstructive bowel gas pattern.  Electronically Signed   By: Fidela Salisbury M.D.   On: 10/28/2021 03:05   DG Chest 2 View  Result Date: 10/27/2021 CLINICAL DATA:  Weakness EXAM: CHEST - 2 VIEW COMPARISON:  10/24/2021 FINDINGS: New patchy infiltrate is seen in right lower lung field suggesting pneumonia. There are no signs of alveolar pulmonary edema. There is no pleural effusion or pneumothorax. IMPRESSION: New patchy infiltrate in medial right lower lung fields suggests atelectasis/pneumonia. Electronically Signed   By: Elmer Picker M.D.   On: 10/27/2021 19:51    Assessment/Plan:   Principal Problem:   AKI (acute kidney injury) ([place])   Patient Summary: Kristen Colon is a 71 y.o. with a pertinent PMH of metastatic ovarian carcinoma c/b malignant ascites on hospice care, schizophrenia, HTN, DM, who presented with shortness of breath and admitted for AKI and worsening SOB and abdominal pain.    #AKI Pt's initial creatinine upon admission was 2.23, and her baseline seems to be around .7. She is S/P 1L LR, and is given 100cc/LR maintenance fluid. Most recent creatinine level was 1.17. Blood pressure has been stable.   Plan: - Continue to  monitor patient's kidney function   #Metastatic Ovarian Cancer  Malignant Ascites  Abdominal Pain  Pt received her first paracentesis last week, and per daughter pt received relief. She does not have any leukocytosis at the moment. Her abdomen was distended on exam today, with shifting dullness present, which could indicate ascites is recurring already. Will discuss with patient's daughter use of Pleurx drain, however of note at her last admission at [facility] the topic was brought up and the family deferred.    #Dyspnea  #Aspiration Pneumonia vs Pneumonitis vs Atelectasis Chest X-Ray in the emergency department revealed a new infiltrate in the medial RLL that was concerning for pneumonia vs atelectasis vs aspiration pneumonitis in the setting of multiple episodes of emesis. Malignant ascites could also be playing a role here for this patient. Unlikely bacterial in nature due to negative pro calcitonin, will continue to monitor vital and CBC with supportive treatment.   #Goals of Care Patient is a hospice patient and has the diagnosis of Metastatic Ovarian Cancer that's been complicated by Malignant Ascites. Was able to speak with daughter about goals of care at this visit, and daughter says that she wants patient to be able to have bowel movements (has been constipated for 8 days), decreased shortness of breath, as well as having pain under control. We will continue to help with patient's daughters needs for patient. She has not had a bowel movement as of yet, even after enema was given.  Symptoms such as shortness of breath and constipation could be attributed to recurrent ascites secondary to metastatic ovarian cancer.   Plan:  - Continue to discuss goals of care with family   Code: DNR   Dispo: Anticipated discharge in less than two midnights  Drucie Opitz, MD PGY-1 Internal Medicine Resident Pager Number [phone] Please contact the on call pager after 5 pm and on weekends at  [phone].

## 2021-10-29 MED ORDER — BISACODYL 10 MG RE SUPP: 10.0000 mg | Freq: Every day | RECTAL | Status: DC | PRN

## 2021-10-29 MED ORDER — LACTATED RINGERS IV BOLUS
1000.0000 mL | Freq: Once | INTRAVENOUS | Status: AC
  Administered 2021-10-29: 1000 mL via INTRAVENOUS

## 2021-10-29 MED ORDER — SORBITOL 70 % SOLN
400.0000 mL | TOPICAL_OIL | Freq: Once | ORAL | Status: DC
  Filled 2021-10-29: qty 120

## 2021-10-29 NOTE — Progress Notes (Signed)
Subjective:   Summary: Kristen Colon is a 71 y.o. year old female currently admitted on the IMTS HD#1 for shortness of breath and abdominal pain.  Overnight Events: No overnight events  Patient was seen bedside in the a.m.  She was able to communicate much more effectively than yesterday.  She states she is feeling some abdominal pain, and has had 1 bowel movement.  Objective:  Vital signs in last 24 hours: Vitals:   10/28/21 1700 10/28/21 2108 10/29/21 0441 10/29/21 1003  BP: 124/74 129/74 (!) 142/65 114/75  Pulse: 87 94 83 84  Resp: '17 18 18 16  '$ Temp: 98.1 F (36.7 C) 98.6 F (37 C) 98.3 F (36.8 C) 98.1 F (36.7 C)  TempSrc: Oral Oral Oral   SpO2: 93% 94% 98% 98%  Weight: 59.8 kg     Height: '5\' 2"'$  (1.575 m)      Supplemental O2: Room Air SpO2: 98 % O2 Flow Rate (L/min): 0.5 L/min   Physical Exam:  Constitutional: Ill-appearing woman lying in bed, no acute distress Cardiovascular: RRR, no murmurs, rubs or gallops Pulmonary/Chest: normal work of breathing on room air, lungs clear to auscultation bilaterally Abdominal: Distended abdomen, tender to palpation in the epigastric region, shifting dullness present Skin: warm and dry Extremities: upper/lower extremity pulses 2+, no lower extremity edema present  Filed Weights   10/28/21 1700  Weight: 59.8 kg     Intake/Output Summary (Last 24 hours) at 10/29/2021 1426 Last data filed at 10/29/2021 0800 Gross per 24 hour  Intake 120 ml  Output 200 ml  Net -80 ml   Net IO Since Admission: 1,020.09 mL [10/29/21 1426]  Pertinent Labs:    Latest Ref Rng & Units 10/28/2021    3:10 AM 10/27/2021    7:07 PM 03/21/2020   11:35 AM  CBC  WBC 4.0 - 10.5 K/uL 7.8  10.0  7.5   Hemoglobin 12.0 - 15.0 g/dL 11.9  11.9  13.5   Hematocrit 36.0 - 46.0 % 38.5  37.3  40.5   Platelets 150 - 400 K/uL 334  351  311        Latest Ref Rng & Units 10/28/2021    3:10 AM 10/27/2021    7:07 PM 03/21/2020   11:35 AM   CMP  Glucose 70 - 99 mg/dL 107  156  262   BUN 8 - 23 mg/dL '17  24  9   '$ Creatinine 0.44 - 1.00 mg/dL 1.17  2.23  0.74   Sodium 135 - 145 mmol/L 142  139  139   Potassium 3.5 - 5.1 mmol/L 4.4  4.2  3.6   Chloride 98 - 111 mmol/L 107  98  105   CO2 22 - 32 mmol/L '23  30  20   '$ Calcium 8.9 - 10.3 mg/dL 8.9  8.6  9.8   Total Protein 6.5 - 8.1 g/dL  6.2  7.4   Total Bilirubin 0.3 - 1.2 mg/dL  0.3  0.4   Alkaline Phos 38 - 126 U/L  53  69   AST 15 - 41 U/L  46  30   ALT 0 - 44 U/L  18  23      Assessment/Plan:   Principal Problem:   AKI (acute kidney injury) ([place])   Patient Summary: Kristen Colon is a 71 y.o. with a pertinent PMH of metastatic ovarian carcinoma complicated by malignant  ascites on hospice care, schizophrenia, hypertension, diabetes mellitus, who presented with shortness of breath and admitted for AKI, worsening shortness of breath, and abdominal pain.    #AKI On able to ascertain creatinine level, as patient has been refusing labs.  However last creatinine was 1.17, and on admission was 2.23 status post 2 L of LR.  #Metastatic ovarian cancer #Malignant ascites #Abdominal pain On exam today patient was in pain in the epigastric region is tender to palpation.  Patient received her first paracentesis last week and per daughter she received relief.  However it seems like ascites is coming back as patient is on hospice care and is currently not on any chemotherapy for the ovarian cancer.  PleurX drain was discussed with daughter at her previous admission, however at the time daughter was not interested.  We will continue to monitor pain levels  #Dyspnea #Aspiration pneumonia versus pneumonitis versus atelectasis Unable to tell most recent white blood cell count as patient has been refusing labs.  Chest x-ray in emergency department revealed a new infiltrate in the medial right lower lobe without concerning for pneumonia versus atelectasis versus aspiration pneumonitis in  the setting of multiple episodes of emesis.  On exam today she did not seem dyspneic, and lungs were clear to examination.  #Goals of care Patient is a hospice patient has a diagnosis of metastatic ovarian cancer has been complicated by malignant ascites.  Goals of care remain: Relieve constipation, pain control, decrease shortness of breath.  Daughter requested an SLP evaluation as she has noticed her mother has not been able to tolerate a lot of the food that she used to be able to eat, and SLP recommended dysphagia 2 diet.  We will continue to discuss goals of care with patient's family, as well as working in conjunction with hospice services.  Code: DNR Dispo: Anticipated discharge in less than three midnights.  Drucie Opitz, MD PGY-1 Internal Medicine Resident Pager Number [phone] Please contact the on call pager after 5 pm and on weekends at [phone].

## 2021-10-29 NOTE — Progress Notes (Signed)
SLP Cancellation Note  Patient Details Name: Kristen Colon MRN: [id number] DOB: 1950-02-12   Cancelled treatment:       Reason Eval/Treat Not Completed: Medical issues which prohibited therapy. Pt declined any POs at this time, stating that she was nauseous. She does say that she was able to eat some of her chopped foods already without trouble chewing or swallowing. SLP will f/u as able.     Osie Bond., M.A. [place] Office [phone]  Secure chat preferred  10/29/2021, 10:29 AM

## 2021-10-29 NOTE — Progress Notes (Signed)
Patient refuses lab to draw her blood this morning, she is educated.

## 2021-10-29 NOTE — Progress Notes (Signed)
[facility]) Hospital Note   Kristen Colon, current [facility] hospice at home patient, came to [facility] ED w/ complaints of weakness.  Patient's terminal diagnosis is metastatic ovarian cancer with malignant ascites.  Patient was recently discharged from [facility] w/ shortness of breath s/p Paracentesis yielding 5L.  Upon ED evaluation CXR showed new patchy infiltrate.  Palliative and attending MD at last admission talked with family about placement of Pleurx drain, but family deferred at that time.  Per Dr. Cherie Ouch, [facility] MD, this is a related hospital admission.    Visited patient this morning at the bedside.  Patient just waking up, in good spirits, alert and oriented.  No complaints of pain or nausea.  Two small bowel movements documented in patient chart.  I also called and spoke with daughter, [name].  [name] voiced concern of caregiver burnout.  [name] had a niece come to stay and help take care of her mother but this arrangement has not worked out well so far.  [name] is also working on funeral arrangements in [place] per her mothers wishes.  [name] is also concerned that too much information said to her mother will make her too paranoid.    Patient has been seen by SLP and recommended a Dysphagia II diet w/ thin liquids.  Patient stated she has not had any trouble with her food since the diet was changed.    VS 98.1 oral  114/75 HR 84 RR 16 98 RA   Labs Patient refused morning labs.   Diagnostics CXR: new patchy inflitrate in medial right lower fields suggesting atelectasis/pneumonia.   Abd XR: non obstructive bowel gas pattern    IV/PRN Medications IVF and IV antibiotics dc'd 10.9.   Problems List Atelectasis/ Aspiration Pneumonitis: IV antibiotics d/c'd due to labs not supporting infection.  SLP evaluation completed and recommend Dysphagia II diet with thin liquids.   AKI - Cr 2.23 on admission, baseline appears to be around 0.7.  IVF  d/c'd.   Abdominal pain - Abd XR showed normal bowel gas pattern.  Soaps Suds enema ordered.  Pt had two small BMs over night.   Metastatic Ovarian Cancer: no current treatment, under hospice services.   Goals of Care: Treat constipation, SLP evaluated and recommended diet. Plan to dc back home w/ hospice.    Family Contact:  [name] [phone] updated.    Hospice IDT updated.  Thank you for allowing [place] to participate in this patient's care.  Please call [facility] for transport at time of discharge as [facility] contracts this service with [facility] for our active hospice patients.    Kenna Gilbert BSN, RN  [facility] Liaison RN  [phone]

## 2021-10-29 NOTE — Progress Notes (Signed)
Patient's daughter called asking for social worker to call because she wanted to update her mom's POA document and also to discuss POC.

## 2021-10-29 NOTE — Progress Notes (Signed)
Mobility Specialist Progress Note:   10/29/21 1600  Mobility  Activity Ambulated with assistance in room  Level of Assistance Contact guard assist, steadying assist  Assistive Device None  Distance Ambulated (ft) 15 ft  Activity Response Tolerated well  Mobility Referral Yes  $Mobility charge 1 Mobility   Responded to bed alarm, pt requesting assistance with bath set-up. Ambulated with no physical assistance required. Pt left sitting EOB with all needs met.  Nelta Numbers Acute Rehab Secure Chat or Office Phone: [phone]

## 2021-10-30 LAB — BASIC METABOLIC PANEL
Anion gap: 7 (ref 5–15)
BUN: 7 mg/dL — ABNORMAL LOW (ref 8–23)
CO2: 23 mmol/L (ref 22–32)
Calcium: 8.7 mg/dL — ABNORMAL LOW (ref 8.9–10.3)
Chloride: 108 mmol/L (ref 98–111)
Creatinine, Ser: 0.72 mg/dL (ref 0.44–1.00)
GFR, Estimated: >60 mL/min (ref >60)
Glucose, Bld: 119 mg/dL — ABNORMAL HIGH (ref 70–99)
Potassium: 3.8 mmol/L (ref 3.5–5.1)
Sodium: 138 mmol/L (ref 135–145)

## 2021-10-30 LAB — CBC
HCT: 36.1 % (ref 36.0–46.0)
Hemoglobin: 11.7 g/dL — ABNORMAL LOW (ref 12.0–15.0)
MCH: 27.7 pg (ref 26.0–34.0)
MCHC: 32.4 g/dL (ref 30.0–36.0)
MCV: 85.5 fL (ref 80.0–100.0)
Platelets: 400 10*3/uL (ref 150–400)
RBC: 4.22 MIL/uL (ref 3.87–5.11)
RDW: 15.6 % — ABNORMAL HIGH (ref 11.5–15.5)
WBC: 6.6 10*3/uL (ref 4.0–10.5)
nRBC: 0 % (ref 0.0–0.2)

## 2021-10-30 MED ORDER — ENOXAPARIN SODIUM 40 MG/0.4ML IJ SOSY: 40.0000 mg | PREFILLED_SYRINGE | Freq: Every day | INTRAMUSCULAR | Status: DC

## 2021-10-30 MED ORDER — FENTANYL 75 MCG/HR TD PT72
1.0000 | MEDICATED_PATCH | TRANSDERMAL | Status: DC
  Administered 2021-10-30: 1 via TRANSDERMAL
  Filled 2021-10-30: qty 1

## 2021-10-30 NOTE — Progress Notes (Signed)
[facility] Note  Ni[place] will discharge today via [place].  Daughter [name] has requested transport to be set up for 5pm as she is having heating repairs done at home.   Please call [facility] to set up transport as we have a contract with them to transport.   Please reach out if there are any questions.    Kenna Gilbert BSN, RN  [facility] Liaison  [phone]  Amion-Hospice

## 2021-10-30 NOTE — Progress Notes (Signed)
Mobility Specialist Progress Note:   10/30/21 1500  Mobility  Activity Ambulated with assistance to bathroom  Level of Assistance Minimal assist, patient does 75% or more  Assistive Device Other (Comment) ([facility])  Distance Ambulated (ft) 30 ft  Activity Response Tolerated well  Mobility Referral Yes  $Mobility charge 1 Mobility   Pt requesting to go to BR. Required minA through [facility] to stand, then steadying assist with gait. Pt back in bed with all needs met. Bed alarm on.  Nelta Numbers Acute Rehab Secure Chat or Office Phone: [phone]

## 2021-10-30 NOTE — Progress Notes (Addendum)
Received call for update from daughter. Daughter began having aggressive attitude. When informed pt had not received pain medication this shift and wasn't on scheduled pain medication. Pt daughter began cursing at this Probation officer later stated "How can yall be so stupid she's a cancer patient. Earlie Server are suppose to be Doctors and Nurses" Requested to speak with provider tonight. Pt daughter informed patient had denied pain and had been resting this shift. Informed daughter this is my first shift and I wasn't aware of her request to receive scheduled pain medication but I could relay message to provider. She requested me to take phone to pt. Took phone to patient then she again wanted a list of medication that was given already provided. Statin she's drugged. Informed daughter that I had woke her up. Which was her request. Pt told the daughter that she was not in pain. Another RN at bedside and heard pt state not in pain. Daughter requested that MD reorder fentanyl patch and to call with an update. Fentanyl patch to right upper arm. Upon finishing call daughter was appreciative of care since mother denied pain.

## 2021-10-30 NOTE — Progress Notes (Signed)
Speech Language Pathology Treatment: Dysphagia  Patient Details Name: Kristen Colon MRN: [id number] DOB: [birth year] Today's Date: 10/30/2021 Time: 7622-6333 SLP Time Calculation (min) (ACUTE ONLY): 20 min  Assessment / Plan / Recommendation Clinical Impression  Patient seen by SLP for skilled treatment focused on dysphagia. Patient awake and alert but complaining of abdominal pain "pins and needles". She was agreeable to having some apple juice and vanilla pudding per her request. When SLP returned to room, patient using call bell to request bathroom assistance. Nursing student arrived and after SLP assisted with standing up from bed, patient was then able to ambulate with +1 assist. She was then agreeable to eating and consumed 90% of minced sausage and approximately 50% of scrambled eggs. Trace amount of anterior spillage of bits of eggs on lips but otherwise tolerating without significant oral residuals and no cough or throat clearing. She did exhibit a few instances of hiccups. Patient does not have dentures present and as she appears to be tolerating Dys 2, thin liquids diet, SLP recommending to continue with this. She likely would be fine to return to her PO diet that she was consuming at home upon discharge. SLP will continue to follow while admitted.   HPI HPI: Pt is a 71 yo female currently receiving hospice care, presenting 10/8 with shortness of breath and abdominal pain with N/V. She is admitted with AKI and concern for PNA given new infiltrate in the medial RLL. BSE in February 2021 noted prolonged oral preparation and ill-fitting dentures. She did need pureed foods briefly, but advanced to Dys 2 diet and thin liquids. PMH includes: metastatic ovarian carcinoma c/b malignant ascites, schizophrenia, HTN, DM, dementia, anxiety      SLP Plan  Continue with current plan of care      Recommendations for follow up therapy are one component of a multi-disciplinary discharge planning  process, led by the attending physician.  Recommendations may be updated based on patient status, additional functional criteria and insurance authorization.    Recommendations  Diet recommendations: Dysphagia 2 (fine chop);Thin liquid Liquids provided via: Cup;Straw Medication Administration: Whole meds with liquid Supervision: Patient able to self feed Compensations: Minimize environmental distractions;Slow rate;Small sips/bites;Follow solids with liquid Postural Changes and/or Swallow Maneuvers: Seated upright 90 degrees                Oral Care Recommendations: Oral care BID Follow Up Recommendations: No SLP follow up Assistance recommended at discharge: PRN SLP Visit Diagnosis: Dysphagia, unspecified (R13.10) Plan: Continue with current plan of care          Sonia Baller, MA, CCC-SLP Speech Therapy

## 2021-10-30 NOTE — Discharge Summary (Addendum)
Name: Kristen Colon MRN: [id number] DOB: Oct 19, 1950 71 y.o. PCP: Everardo Beals, NP  Date of Admission: 10/27/2021  6:45 PM Date of Discharge: 10/30/2021 Attending Physician: Angelica Pou, MD  Discharge Diagnosis: Metastatic ovarian cancer Principal problems: Uncontrolled abdominal pain Constipation Metastatic ovarian cancer Acute kidney injury   Discharge Medications: Allergies as of 10/30/2021       Reactions   Hydrocodone Itching   Per hospice   5-alpha Reductase Inhibitors Other (See Comments)   Unknown reaction   Percocet [oxycodone-acetaminophen] Itching        Medication List     STOP taking these medications    amLODipine 5 MG tablet Commonly known as: NORVASC   lisinopril 10 MG tablet Commonly known as: ZESTRIL   metoprolol tartrate 25 MG tablet Commonly known as: LOPRESSOR       TAKE these medications    acetaminophen 325 MG tablet Commonly known as: TYLENOL Take 2 tablets (650 mg total) by mouth every 8 (eight) hours as needed for mild pain or moderate pain.   albuterol (2.5 MG/3ML) 0.083% nebulizer solution Commonly known as: PROVENTIL Take 2.5 mg by nebulization every 6 (six) hours as needed for wheezing or shortness of breath.   Ventolin HFA 108 (90 Base) MCG/ACT inhaler Generic drug: albuterol Inhale 2 puffs into the lungs every 6 (six) hours as needed for wheezing or shortness of breath.   baclofen 10 MG tablet Commonly known as: LIORESAL Take 10 mg by mouth daily as needed for muscle spasms.   benzonatate 200 MG capsule Commonly known as: TESSALON Take 200 mg by mouth at bedtime.   benztropine 0.5 MG tablet Commonly known as: COGENTIN Take 0.5 mg by mouth at bedtime.   bisacodyl 10 MG suppository Commonly known as: DULCOLAX Place 10 mg rectally daily as needed for moderate constipation.   buPROPion 150 MG 24 hr tablet Commonly known as: WELLBUTRIN XL Take 150 mg by mouth daily.   diazepam 5 MG  tablet Commonly known as: VALIUM Take 5 mg by mouth at bedtime.   fentaNYL 75 MCG/HR Commonly known as: [place] 1 patch onto the skin every 3 (three) days. What changed: Another medication with the same name was removed. Continue taking this medication, and follow the directions you see here.   glipiZIDE 5 MG tablet Commonly known as: GLUCOTROL Take 5 mg by mouth 2 (two) times daily before a meal.   HYDROcodone-acetaminophen 5-325 MG tablet Commonly known as: NORCO/VICODIN Take 1 tablet by mouth every 6 (six) hours as needed for moderate pain. What changed: when to take this   Kirt Boys 234 MG/1.5ML injection Generic drug: paliperidone Inject 234 mg into the muscle every 30 (thirty) days. What changed: Another medication with the same name was removed. Continue taking this medication, and follow the directions you see here.   Linzess 290 MCG Caps capsule Generic drug: linaclotide Take 290 mcg by mouth daily before breakfast.   lubiprostone 24 MCG capsule Commonly known as: AMITIZA Take 24 mcg by mouth daily.   memantine 5 MG tablet Commonly known as: NAMENDA Take 1 tablet (5 mg total) by mouth 2 (two) times daily.   metoCLOPramide 10 MG tablet Commonly known as: REGLAN Take 10 mg by mouth 3 (three) times daily.   mirtazapine 15 MG tablet Commonly known as: REMERON Take 15 mg by mouth at bedtime.   morphine CONCENTRATE 10 mg / 0.5 ml concentrated solution Take 5 mg by mouth every 2 (two) hours as needed for moderate  pain.   omeprazole 20 MG capsule Commonly known as: PRILOSEC Take 20 mg by mouth daily.   ondansetron 4 MG tablet Commonly known as: ZOFRAN Take 4 mg by mouth every 8 (eight) hours as needed for nausea or vomiting.   polyethylene glycol powder 17 GM/SCOOP powder Commonly known as: GLYCOLAX/MIRALAX Take 0.5 Containers by mouth daily as needed for moderate constipation.   promethazine 25 MG suppository Commonly known as:  PHENERGAN Place 25 mg rectally every 6 (six) hours as needed for nausea or vomiting.   promethazine 25 MG tablet Commonly known as: PHENERGAN Take 25 mg by mouth every 6 (six) hours as needed for nausea or vomiting.   risperiDONE 0.5 MG tablet Commonly known as: RISPERDAL Take 0.5 mg by mouth 2 (two) times daily as needed (bipolar).   traMADol 50 MG tablet Commonly known as: ULTRAM Take 50 mg by mouth every 6 (six) hours.        Disposition and follow-up:   Kristen Colon was discharged from [facility] in Good condition.  At the hospital follow up visit please address:  1.  Ongoing consideration of Pleurx drain for recurrent ascites (this was recently discussed with her cancer doctors at [place] and was declined at that time).   2.  Labs / imaging needed at time of follow-up: BMP to monitor kidney function, particularly if aCEI is restarted (kidney function improved with cessation of ACEI and with hydration)  3.  Pending labs/ test needing follow-up: NA  4.  Monitor for ongoing need for antihypertensive, all of which were held during at admission and not resumed at time of discharge, as BP has been normal without.    Hospital Course by problem list:  #AKI Patient presented on admission with a creatinine level of 2.23, and baseline appeared to be around 0.73. UA at the time showed trace leukocytes with hyaline casts, however patient was asymptomatic. Antihypertensive medications (including ACEI) were held, and she was given a bolus of LR, and started on maintenance fluids. Most recent creatinine was 0.72 .  ACEI is not resumed at time of discharge.  #Metastatic Ovarian Cancer, 2016  #Malignant Ascites  #Abdominal Pain  Patient was initially diagnosed with metastatic ovarian cancer in 2016, and was transitioned to [facility] for hospice care in 2021. On 10/6 pt was noted to have new dyspnea and a presyncopal episode, she then was found to have a large ascites  and underwent a therapeutic paracentesis on 10/6. On admission, patient was having severe abdominal pain and constipation, likely caused by the recurrent ascites. KUB was ordered and SBO was ruled out. She was given a sud enema and dulcolax suppository which helped her have two bowel movements while in the hospital. She was also given Dilaudid .'5mg'$  [id number] as needed for pain, however patient remained mostly pain free throughout her course in the hospital.  #Dyspnea, spontaneously resolved Pt initially presented with shortness of breath, and chest x-ray revealed a new infiltrate in the medial RLL that was concerning for pneumonia vs atelectasis vs aspiration pneumonia. CBC at the time was without leukocytosis, and O2 requirements were improving. Procalcitonin was negative. She denied shortness of breath, fevers, or chills throughout her hospital course, and breathing was improving with supportive therapy   #Goals of Care Pt is a hospice patient and has a diagnosis of metastatic ovarian cancer that has been complicated by malignant ascites. Goals of care were discussed with patient's daughter (who is primary care taker), and she listed three  things: Constipation relief, pain control, and resolved shortness of breath. On top of that, she noticed that her mother was not able to tolerate her normal diet, so SLP eval as well. SLP eval recommended a dysphagia 2 diet. Constipation and pain control was achieved successfully.   Discharge Exam:   BP 125/75 (BP Location: Left Arm)   Pulse 74   Temp 98.8 F (37.1 C) (Oral)   Resp 14   Ht '5\' 2"'$  (1.575 m)   Wt 59.8 kg   LMP  (LMP Unknown) Comment: years ago per Pt  SpO2 91%   BMI 24.11 kg/m  Discharge exam:   Constitutional: Ill-appearing woman lying in bed, no acute distress Cardiovascular: RRR, no murmurs, rubs or gallops Pulmonary/Chest: normal work of breathing on room air, lungs clear to auscultation bilaterally Abdominal: Distended abdomen, tender  to palpation in the epigastric region, shifting dullness present Skin: warm and dry Extremities: upper/lower extremity pulses 2+, no lower extremity edema present  Pertinent Labs, Studies, and Procedures:     Latest Ref Rng & Units 10/30/2021    1:32 AM 10/28/2021    3:10 AM 10/27/2021    7:07 PM  BMP  Glucose 70 - 99 mg/dL 119  107  156   BUN 8 - 23 mg/dL '7  17  24   '$ Creatinine 0.44 - 1.00 mg/dL 0.72  1.17  2.23   Sodium 135 - 145 mmol/L 138  142  139   Potassium 3.5 - 5.1 mmol/L 3.8  4.4  4.2   Chloride 98 - 111 mmol/L 108  107  98   CO2 22 - 32 mmol/L '23  23  30   '$ Calcium 8.9 - 10.3 mg/dL 8.7  8.9  8.6        Latest Ref Rng & Units 10/30/2021    1:32 AM 10/28/2021    3:10 AM 10/27/2021    7:07 PM  CBC  WBC 4.0 - 10.5 K/uL 6.6  7.8  10.0   Hemoglobin 12.0 - 15.0 g/dL 11.7  11.9  11.9   Hematocrit 36.0 - 46.0 % 36.1  38.5  37.3   Platelets 150 - 400 K/uL 400  334  351      Discharge Instructions: Discharge Instructions     Call MD for:  persistant nausea and vomiting   Complete by: As directed    Call MD for:  redness, tenderness, or signs of infection (pain, swelling, redness, odor or green/yellow discharge around incision site)   Complete by: As directed    Call MD for:  severe uncontrolled pain   Complete by: As directed    Call MD for:  temperature >100.4   Complete by: As directed    Diet - low sodium heart healthy   Complete by: As directed    Increase activity slowly   Complete by: As directed        Signed: Drucie Opitz, MD 10/30/2021, 2:29 PM   Pager: [phone]   Attending attestation: Patient was evaluated on rounds and I agree with Dr. Quillian Quince documentation.

## 2021-10-30 NOTE — Plan of Care (Signed)
Pt alert to self and situation at times. Pt vitals stable. No prns given. Rested during overnight and denied pain. Pt gets up to bsc with one assist. Msg sent to today's provider to update daughter and her request for fentanyl to be ordered.  Problem: Education: Goal: Knowledge of General Education information will improve Description: Including pain rating scale, medication(s)/side effects and non-pharmacologic comfort measures Outcome: Progressing   Problem: Health Behavior/Discharge Planning: Goal: Ability to manage health-related needs will improve Outcome: Progressing   Problem: Clinical Measurements: Goal: Ability to maintain clinical measurements within normal limits will improve Outcome: Progressing Goal: Will remain free from infection Outcome: Progressing Goal: Diagnostic test results will improve Outcome: Progressing Goal: Respiratory complications will improve Outcome: Progressing Goal: Cardiovascular complication will be avoided Outcome: Progressing   Problem: Activity: Goal: Risk for activity intolerance will decrease Outcome: Progressing   Problem: Nutrition: Goal: Adequate nutrition will be maintained Outcome: Progressing   Problem: Coping: Goal: Level of anxiety will decrease Outcome: Progressing   Problem: Elimination: Goal: Will not experience complications related to bowel motility Outcome: Progressing Goal: Will not experience complications related to urinary retention Outcome: Progressing   Problem: Pain Managment: Goal: General experience of comfort will improve Outcome: Progressing   Problem: Safety: Goal: Ability to remain free from injury will improve Outcome: Progressing   Problem: Skin Integrity: Goal: Risk for impaired skin integrity will decrease Outcome: Progressing

## 2021-10-30 NOTE — Discharge Instructions (Signed)
It was a pleasure being a part of your care. You were brought into the hospital because of some abdominal pain, constipation, and shortness of breath. You were given some pain meds, and also some medications to help move your bowels, which helped you have a bowel movement. I would recommend you think about placing that drain in your abdomen whenever you feel able to do so. Take care.

## 2021-10-30 NOTE — TOC Transition Note (Signed)
Transition of Care [facility]) - CM/SW Discharge Note   Patient Details  Name: Kristen Colon MRN: [id number] Date of Birth: [birth year]  [facility] CM/SW Contact:  Tom-Johnson, Renea Ee, RN Phone Number: 10/30/2021, 1:57 PM   Clinical Narrative:     Patient is scheduled for discharge today with the resumption of Hospice care from [facility]. Transportation scheduled with [facility] for 5 pm.  No further TOC needs noted.    Final next level of care: Home w Hospice Care Barriers to Discharge: Barriers Resolved   Patient Goals and CMS Choice Patient states their goals for this hospitalization and ongoing recovery are:: To return home CMS Medicare.gov Compare Post Acute Care list provided to:: Patient Represenative (must comment) (Daughter, [name]) Choice offered to / list presented to : NA  Discharge Placement                Patient to be transferred to facility by: [facility]      Discharge Plan and Services                DME Arranged: N/A DME Agency: NA       HH Arranged: NA HH Agency: NA        Social Determinants of Health (SDOH) Interventions     Readmission Risk Interventions     No data to display

## 2022-11-10 ENCOUNTER — Encounter (HOSPITAL_COMMUNITY): Payer: Self-pay

## 2022-11-10 ENCOUNTER — Emergency Department (HOSPITAL_COMMUNITY)
Admission: EM | Admit: 2022-11-10 | Discharge: 2022-11-11 | Disposition: A | Discharge status: Home / Self Care | Payer: Medicare Other | Attending: Emergency Medicine | Admitting: Emergency Medicine

## 2022-11-10 DIAGNOSIS — I824Y2 Acute embolism and thrombosis of unspecified deep veins of left proximal lower extremity: Secondary | ICD-10-CM

## 2022-11-10 DIAGNOSIS — F0390 Unspecified dementia without behavioral disturbance: Secondary | ICD-10-CM | POA: Diagnosis not present

## 2022-11-10 DIAGNOSIS — M79605 Pain in left leg: Secondary | ICD-10-CM

## 2022-11-10 DIAGNOSIS — Z7984 Long term (current) use of oral hypoglycemic drugs: Secondary | ICD-10-CM | POA: Insufficient documentation

## 2022-11-10 DIAGNOSIS — R531 Weakness: Secondary | ICD-10-CM | POA: Diagnosis not present

## 2022-11-10 DIAGNOSIS — M7989 Other specified soft tissue disorders: Secondary | ICD-10-CM | POA: Diagnosis present

## 2022-11-10 DIAGNOSIS — E119 Type 2 diabetes mellitus without complications: Secondary | ICD-10-CM | POA: Insufficient documentation

## 2022-11-10 DIAGNOSIS — M79662 Pain in left lower leg: Secondary | ICD-10-CM | POA: Insufficient documentation

## 2022-11-10 DIAGNOSIS — I10 Essential (primary) hypertension: Secondary | ICD-10-CM | POA: Insufficient documentation

## 2022-11-10 DIAGNOSIS — Z8543 Personal history of malignant neoplasm of ovary: Secondary | ICD-10-CM | POA: Diagnosis not present

## 2022-11-10 HISTORY — DX: Deep phlebothrombosis in pregnancy, unspecified trimester: O22.30

## 2022-11-10 LAB — CBC
HCT: 41.3 % (ref 36.0–46.0)
Hemoglobin: 12.9 g/dL (ref 12.0–15.0)
MCH: 28.8 pg (ref 26.0–34.0)
MCHC: 31.2 g/dL (ref 30.0–36.0)
MCV: 92.2 fL (ref 80.0–100.0)
Platelets: 293 10*3/uL (ref 150–400)
RBC: 4.48 MIL/uL (ref 3.87–5.11)
RDW: 15.3 % (ref 11.5–15.5)
WBC: 7.1 10*3/uL (ref 4.0–10.5)
nRBC: 0 % (ref 0.0–0.2)

## 2022-11-10 LAB — PROTIME-INR
INR: 1 (ref 0.8–1.2)
Prothrombin Time: 13.6 s (ref 11.4–15.2)

## 2022-11-10 NOTE — ED Triage Notes (Signed)
Pt arrived POV with c/o left leg swelling for the past week, pt was dx with a DVT in that same leg a year ago. Pt is currently on Eliquis 5mg  BID. Pt left leg is swollen, taunt, and shiny, 2+ pedal pulse present, skin warm and dry. A&O x4, VSS.

## 2022-11-11 ENCOUNTER — Emergency Department (HOSPITAL_BASED_OUTPATIENT_CLINIC_OR_DEPARTMENT_OTHER): Payer: Medicare Other

## 2022-11-11 DIAGNOSIS — M7989 Other specified soft tissue disorders: Secondary | ICD-10-CM | POA: Diagnosis not present

## 2022-11-11 DIAGNOSIS — M79662 Pain in left lower leg: Secondary | ICD-10-CM | POA: Diagnosis not present

## 2022-11-11 LAB — COMPREHENSIVE METABOLIC PANEL
ALT: 20 U/L (ref 0–44)
AST: 25 U/L (ref 15–41)
Albumin: 3.3 g/dL — ABNORMAL LOW (ref 3.5–5.0)
Alkaline Phosphatase: 73 U/L (ref 38–126)
Anion gap: 10 (ref 5–15)
BUN: 9 mg/dL (ref 8–23)
CO2: 22 mmol/L (ref 22–32)
Calcium: 9.1 mg/dL (ref 8.9–10.3)
Chloride: 106 mmol/L (ref 98–111)
Creatinine, Ser: 0.79 mg/dL (ref 0.44–1.00)
GFR, Estimated: >60 mL/min (ref >60)
Glucose, Bld: 114 mg/dL — ABNORMAL HIGH (ref 70–99)
Potassium: 3.9 mmol/L (ref 3.5–5.1)
Sodium: 138 mmol/L (ref 135–145)
Total Bilirubin: 0.3 mg/dL (ref 0.3–1.2)
Total Protein: 6.6 g/dL (ref 6.5–8.1)

## 2022-11-11 LAB — URINALYSIS, ROUTINE W REFLEX MICROSCOPIC
Bilirubin Urine: NEGATIVE
Glucose, UA: NEGATIVE mg/dL
Hgb urine dipstick: NEGATIVE
Ketones, ur: NEGATIVE mg/dL
Nitrite: NEGATIVE
Protein, ur: NEGATIVE mg/dL
Specific Gravity, Urine: 1.016 (ref 1.005–1.030)
pH: 5 (ref 5.0–8.0)

## 2022-11-11 MED ORDER — FENTANYL 75 MCG/HR TD PT72
1.0000 | MEDICATED_PATCH | Freq: Once | TRANSDERMAL | Status: DC
  Administered 2022-11-11: 1 via TRANSDERMAL
  Filled 2022-11-11: qty 1

## 2022-11-11 MED ORDER — APIXABAN 5 MG PO TABS: 5.0000 mg | ORAL_TABLET | Freq: Two times a day (BID) | ORAL | 0 refills | Status: DC

## 2022-11-11 MED ORDER — APIXABAN 5 MG PO TABS
5.0000 mg | ORAL_TABLET | Freq: Once | ORAL | Status: AC
  Administered 2022-11-11: 5 mg via ORAL
  Filled 2022-11-11: qty 1

## 2022-11-11 MED ORDER — ONDANSETRON 4 MG PO TBDP
8.0000 mg | ORAL_TABLET | Freq: Once | ORAL | Status: AC
  Administered 2022-11-11: 8 mg via ORAL
  Filled 2022-11-11: qty 2

## 2022-11-11 NOTE — ED Provider Notes (Signed)
[place] EMERGENCY DEPARTMENT AT [facility] Provider Note   CSN: [id number] Arrival date & time: 11/10/22  2134     History  Chief Complaint  Patient presents with   Leg Swelling    Kristen Colon is a 72 y.o. female.  Patient presents to the emergency department with family complaining of left lower extremity swelling for the past week with associated pain.  Patient with history of DVT in the same leg approximately 1 year ago.  Patient also complains of some increased weakness in the lower extremities bilaterally.  She denies shortness of breath, abdominal pain, chest pain.  Patient does have history of dementia and majority of history is from patient's daughter at bedside.  Patient with extensive past medical history including malignant ascites, type II DM, left ovarian epithelial cancer, hypertension, schizoaffective disorder, dementia  HPI     Home Medications Prior to Admission medications   Medication Sig Start Date End Date Taking? Authorizing Provider  acetaminophen (TYLENOL) 325 MG tablet Take 2 tablets (650 mg total) by mouth every 8 (eight) hours as needed for mild pain or moderate pain. Patient not taking: Reported on 10/28/2021 03/02/19   Tyrone Nine, MD  albuterol (PROVENTIL) (2.5 MG/3ML) 0.083% nebulizer solution Take 2.5 mg by nebulization every 6 (six) hours as needed for wheezing or shortness of breath. 10/12/21   [provider]  baclofen (LIORESAL) 10 MG tablet Take 10 mg by mouth daily as needed for muscle spasms.    [provider]  benzonatate (TESSALON) 200 MG capsule Take 200 mg by mouth at bedtime. 10/10/21   [provider]  benztropine (COGENTIN) 0.5 MG tablet Take 0.5 mg by mouth at bedtime. 05/20/19   [provider]  bisacodyl (DULCOLAX) 10 MG suppository Place 10 mg rectally daily as needed for moderate constipation. 10/26/21   [provider]  buPROPion (WELLBUTRIN XL) 150 MG 24 hr tablet Take 150  mg by mouth daily. 06/10/19   [provider]  diazepam (VALIUM) 5 MG tablet Take 5 mg by mouth at bedtime.    [provider]  fentaNYL (DURAGESIC) 75 MCG/HR Place 1 patch onto the skin every 3 (three) days.    [provider]  glipiZIDE (GLUCOTROL) 5 MG tablet Take 5 mg by mouth 2 (two) times daily before a meal.    [provider]  HYDROcodone-acetaminophen (NORCO/VICODIN) 5-325 MG tablet Take 1 tablet by mouth every 6 (six) hours as needed for moderate pain. Patient taking differently: Take 1 tablet by mouth every other day. 03/02/19   Tyrone Nine, MD  INVEGA SUSTENNA 234 MG/1.5ML injection Inject 234 mg into the muscle every 30 (thirty) days. 10/14/21   [provider]  linaclotide (LINZESS) 290 MCG CAPS capsule Take 290 mcg by mouth daily before breakfast.    [provider]  lubiprostone (AMITIZA) 24 MCG capsule Take 24 mcg by mouth daily.    [provider]  memantine (NAMENDA) 5 MG tablet Take 1 tablet (5 mg total) by mouth 2 (two) times daily. 03/02/19   Tyrone Nine, MD  metoCLOPramide (REGLAN) 10 MG tablet Take 10 mg by mouth 3 (three) times daily. 09/24/21   [provider]  mirtazapine (REMERON) 15 MG tablet Take 15 mg by mouth at bedtime.    [provider]  Morphine Sulfate (MORPHINE CONCENTRATE) 10 mg / 0.5 ml concentrated solution Take 5 mg by mouth every 2 (two) hours as needed for moderate pain. 10/26/21  [provider]  omeprazole (PRILOSEC) 20 MG capsule Take 20 mg by mouth daily. 10/26/21   [provider]  ondansetron (ZOFRAN) 4 MG tablet Take 4 mg by mouth every 8 (eight) hours as needed for nausea or vomiting. 10/26/21   [provider]  polyethylene glycol powder (GLYCOLAX/MIRALAX) 17 GM/SCOOP powder Take 0.5 Containers by mouth daily as needed for moderate constipation. 10/26/21   [provider]  promethazine (PHENERGAN) 25 MG suppository Place 25 mg rectally  every 6 (six) hours as needed for nausea or vomiting. 10/26/21   [provider]  promethazine (PHENERGAN) 25 MG tablet Take 25 mg by mouth every 6 (six) hours as needed for nausea or vomiting. 10/27/21   [provider]  risperiDONE (RISPERDAL) 0.5 MG tablet Take 0.5 mg by mouth 2 (two) times daily as needed (bipolar). 10/07/21   [provider]  traMADol (ULTRAM) 50 MG tablet Take 50 mg by mouth every 6 (six) hours. 10/07/21   [provider]  VENTOLIN HFA 108 (90 Base) MCG/ACT inhaler Inhale 2 puffs into the lungs every 6 (six) hours as needed for wheezing or shortness of breath. 10/17/21   [provider]      Allergies    Hydrocodone, 5-alpha reductase inhibitors, and Percocet [oxycodone-acetaminophen]    Review of Systems   Review of Systems  Physical Exam Updated Vital Signs BP 110/62 (BP Location: Right Arm)   Pulse 61   Temp 98.5 F (36.9 C) (Oral)   Resp 18   Ht 5\' 2"  (1.575 m)   Wt 62.6 kg   LMP  (LMP Unknown) Comment: years ago per Pt  SpO2 92%   BMI 25.24 kg/m  Physical Exam Vitals and nursing note reviewed.  Constitutional:      General: She is not in acute distress.    Appearance: She is well-developed.  HENT:     Head: Normocephalic and atraumatic.  Eyes:     Conjunctiva/sclera: Conjunctivae normal.  Cardiovascular:     Rate and Rhythm: Normal rate and regular rhythm.  Pulmonary:     Effort: Pulmonary effort is normal. No respiratory distress.     Breath sounds: Normal breath sounds.  Abdominal:     Palpations: Abdomen is soft.     Tenderness: There is no abdominal tenderness.  Musculoskeletal:        General: Tenderness present. No swelling.     Cervical back: Neck supple.     Comments: Tenderness to palpation of left calf muscle  Skin:    General: Skin is warm and dry.     Capillary Refill: Capillary refill takes less than 2 seconds.     Findings: No erythema.  Neurological:     Mental Status: She is alert.   Psychiatric:        Mood and Affect: Mood normal.     ED Results / Procedures / Treatments   Labs (all labs ordered are listed, but only abnormal results are displayed) Labs Reviewed  COMPREHENSIVE METABOLIC PANEL - Abnormal; Notable for the following components:      Result Value   Glucose, Bld 114 (*)    Albumin 3.3 (*)    All other components within normal limits  URINALYSIS, ROUTINE W REFLEX MICROSCOPIC - Abnormal; Notable for the following components:   Leukocytes,Ua MODERATE (*)    Bacteria, UA FEW (*)    All other components within normal limits  CBC  PROTIME-INR    EKG None  Radiology No results found.  Procedures Procedures    Medications Ordered in ED Medications - No data to display  ED Course/ Medical Decision Making/ A&P                                 Medical Decision Making Amount and/or Complexity of Data Reviewed Labs: ordered.   This patient presents to the ED for concern of lower extremity pain, this involves an extensive number of treatment options, and is a complaint that carries with it a high risk of complications and morbidity.  The differential diagnosis includes DVT, cellulitis, claudication, others   Co morbidities that complicate the patient evaluation  Dementia, cancer   Additional history obtained:  Additional history obtained from daughter External records from outside source obtained and reviewed including oncology notes in care everywhere   Lab Tests:  I Ordered, and personally interpreted labs.  The pertinent results include: UA with moderate leukocytes, few bacteria; unremarkable BMP; unremarkable CBC   Imaging Studies ordered:  I ordered imaging studies including DVT study of the left lower extremity   Social Determinants of Health:  Patient is on hospice care, lives at home with daughter   Test / Admission - Considered:  Patient with concerned about possible repeat DVT while on anticoagulation and left  lower extremity.  DVT study ordered.  Plan to attempt to ambulate patient after DVT study.  If DVT study negative and patient able to ambulate patient should be able to discharge home.  If patient continues to feel weak/unable to stand may need PT/OT eval.  Patient care being transferred to [facility] at shift handoff.  Disposition pending reassessment after DVT study         Final Clinical Impression(s) / ED Diagnoses Final diagnoses:  Left leg pain    Rx / DC Orders ED Discharge Orders     None         Pamala Duffel 11/11/22 [id number]    Tilden Fossa, MD 11/11/22 [phone]

## 2022-11-11 NOTE — Discharge Instructions (Signed)
Your ultrasound today showed evidence of blood clot.  I have refilled your Eliquis.  As discussed ensure that you are strictly compliant with the Eliquis given that there is concern that you may not be getting the Eliquis as previously prescribed and you may have missed some doses.  Follow-up with the DVT clinic.  For any concerning symptoms return to the emergency room otherwise follow-up with your PCP in the DVT clinic.

## 2022-11-11 NOTE — Progress Notes (Signed)
Left lower ext venous  has been completed. Refer to [facility] under chart review to view preliminary results.   11/11/2022  10:05 AM Starsky Nanna, Gerarda Gunther

## 2022-11-11 NOTE — ED Provider Notes (Signed)
Signout received on this 72 year old female who is pending a DVT study. Physical Exam  BP 97/64   Pulse 64   Temp (!) 97.2 F (36.2 C)   Resp 15   Ht 5\' 2"  (1.575 m)   Wt 62.6 kg   LMP  (LMP Unknown) Comment: years ago per Pt  SpO2 93%   BMI 25.24 kg/m     Procedures  Procedures  ED Course / MDM    Medical Decision Making Amount and/or Complexity of Data Reviewed Labs: ordered.  Risk Prescription drug management.   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  Patient symptoms ongoing for at least 5 days.  She developed swelling and discoloration to her lower leg during this timeframe.  She also has history of DVT in the same leg about a year ago.  Has been taking Eliquis.  However daughter who is at bedside and gives the history states that 2 weeks ago she was at a nursing home and feels like she has not been receiving her Eliquis there and she has around-the-clock caregivers and feels that she is not getting her Eliquis like she is supposed to.  Her plan is to start given her Eliquis and other medications herself.  She does not want to change off of Eliquis at this time.  She would like to continue Eliquis and ensure that she is getting it twice daily as prescribed.  She will follow-up with the DVT clinic and PCP.  Return precautions discussed.  Discharged in stable condition.  They are in agreement with plan.       Marita Kansas, PA-C 11/11/22 1057    Gerhard Munch, MD 11/11/22 1150

## 2022-11-21 ENCOUNTER — Encounter (HOSPITAL_COMMUNITY): Payer: Medicare Other

## 2022-12-20 ENCOUNTER — Emergency Department (HOSPITAL_BASED_OUTPATIENT_CLINIC_OR_DEPARTMENT_OTHER)
Admission: EM | Admit: 2022-12-20 | Discharge: 2022-12-20 | Disposition: A | Discharge status: Home / Self Care | Payer: Medicare Other | Attending: Emergency Medicine | Admitting: Emergency Medicine

## 2022-12-20 ENCOUNTER — Emergency Department (HOSPITAL_BASED_OUTPATIENT_CLINIC_OR_DEPARTMENT_OTHER): Payer: Medicare Other

## 2022-12-20 DIAGNOSIS — F0280 Dementia in other diseases classified elsewhere without behavioral disturbance: Secondary | ICD-10-CM | POA: Insufficient documentation

## 2022-12-20 DIAGNOSIS — R180 Malignant ascites: Secondary | ICD-10-CM | POA: Insufficient documentation

## 2022-12-20 DIAGNOSIS — R0602 Shortness of breath: Secondary | ICD-10-CM | POA: Insufficient documentation

## 2022-12-20 DIAGNOSIS — N3000 Acute cystitis without hematuria: Secondary | ICD-10-CM | POA: Insufficient documentation

## 2022-12-20 DIAGNOSIS — C569 Malignant neoplasm of unspecified ovary: Secondary | ICD-10-CM | POA: Insufficient documentation

## 2022-12-20 DIAGNOSIS — Z7984 Long term (current) use of oral hypoglycemic drugs: Secondary | ICD-10-CM | POA: Insufficient documentation

## 2022-12-20 DIAGNOSIS — G309 Alzheimer's disease, unspecified: Secondary | ICD-10-CM | POA: Diagnosis not present

## 2022-12-20 DIAGNOSIS — E119 Type 2 diabetes mellitus without complications: Secondary | ICD-10-CM | POA: Insufficient documentation

## 2022-12-20 DIAGNOSIS — R109 Unspecified abdominal pain: Secondary | ICD-10-CM | POA: Diagnosis present

## 2022-12-20 DIAGNOSIS — Z7901 Long term (current) use of anticoagulants: Secondary | ICD-10-CM | POA: Insufficient documentation

## 2022-12-20 LAB — COMPREHENSIVE METABOLIC PANEL
ALT: 13 U/L (ref 0–44)
AST: 18 U/L (ref 15–41)
Albumin: 3.9 g/dL (ref 3.5–5.0)
Alkaline Phosphatase: 68 U/L (ref 38–126)
Anion gap: 7 (ref 5–15)
BUN: 7 mg/dL — ABNORMAL LOW (ref 8–23)
CO2: 31 mmol/L (ref 22–32)
Calcium: 9.9 mg/dL (ref 8.9–10.3)
Chloride: 103 mmol/L (ref 98–111)
Creatinine, Ser: 0.84 mg/dL (ref 0.44–1.00)
GFR, Estimated: >60 mL/min (ref >60)
Glucose, Bld: 102 mg/dL — ABNORMAL HIGH (ref 70–99)
Potassium: 3.6 mmol/L (ref 3.5–5.1)
Sodium: 141 mmol/L (ref 135–145)
Total Bilirubin: 0.3 mg/dL (ref <1.2)
Total Protein: 7.4 g/dL (ref 6.5–8.1)

## 2022-12-20 LAB — URINALYSIS, ROUTINE W REFLEX MICROSCOPIC
Bilirubin Urine: NEGATIVE
Glucose, UA: NEGATIVE mg/dL
Hgb urine dipstick: NEGATIVE
Ketones, ur: NEGATIVE mg/dL
Nitrite: NEGATIVE
Specific Gravity, Urine: 1.023 (ref 1.005–1.030)
pH: 7 (ref 5.0–8.0)

## 2022-12-20 LAB — CBC WITH DIFFERENTIAL/PLATELET
Abs Immature Granulocytes: 0.01 10*3/uL (ref 0.00–0.07)
Basophils Absolute: 0 10*3/uL (ref 0.0–0.1)
Basophils Relative: 1 %
Eosinophils Absolute: 0.2 10*3/uL (ref 0.0–0.5)
Eosinophils Relative: 3 %
HCT: 41.4 % (ref 36.0–46.0)
Hemoglobin: 13.3 g/dL (ref 12.0–15.0)
Immature Granulocytes: 0 %
Lymphocytes Relative: 35 %
Lymphs Abs: 2.2 10*3/uL (ref 0.7–4.0)
MCH: 29.4 pg (ref 26.0–34.0)
MCHC: 32.1 g/dL (ref 30.0–36.0)
MCV: 91.4 fL (ref 80.0–100.0)
Monocytes Absolute: 0.4 10*3/uL (ref 0.1–1.0)
Monocytes Relative: 6 %
Neutro Abs: 3.5 10*3/uL (ref 1.7–7.7)
Neutrophils Relative %: 55 %
Platelets: 295 10*3/uL (ref 150–400)
RBC: 4.53 MIL/uL (ref 3.87–5.11)
RDW: 15.2 % (ref 11.5–15.5)
WBC: 6.3 10*3/uL (ref 4.0–10.5)
nRBC: 0 % (ref 0.0–0.2)

## 2022-12-20 LAB — PROTIME-INR
INR: 1 (ref 0.8–1.2)
Prothrombin Time: 13.5 s (ref 11.4–15.2)

## 2022-12-20 LAB — LIPASE, BLOOD: Lipase: 12 U/L (ref 11–51)

## 2022-12-20 MED ORDER — ALBUTEROL SULFATE HFA 108 (90 BASE) MCG/ACT IN AERS: 2.0000 | INHALATION_SPRAY | RESPIRATORY_TRACT | Status: DC | PRN

## 2022-12-20 MED ORDER — CEPHALEXIN 500 MG PO CAPS: 500.0000 mg | ORAL_CAPSULE | Freq: Four times a day (QID) | ORAL | 0 refills | Status: AC

## 2022-12-20 MED ORDER — IOHEXOL 350 MG/ML SOLN
75.0000 mL | Freq: Once | INTRAVENOUS | Status: AC | PRN
  Administered 2022-12-20: 75 mL via INTRAVENOUS

## 2022-12-20 MED ORDER — CEPHALEXIN 500 MG PO CAPS: 500.0000 mg | ORAL_CAPSULE | Freq: Four times a day (QID) | ORAL | 0 refills | Status: DC

## 2022-12-20 MED ORDER — MORPHINE SULFATE (PF) 4 MG/ML IV SOLN
4.0000 mg | Freq: Once | INTRAVENOUS | Status: AC
  Administered 2022-12-20: 4 mg via INTRAVENOUS
  Filled 2022-12-20: qty 1

## 2022-12-20 NOTE — ED Triage Notes (Addendum)
Per daughter at bedside, pt has become SOB related to fluid excess in her belly and mild non-prod cough for 2 weeks. Pt has ovarian cancer and related tumors that have spread and daughter is concerned that excess fluid is r/t tumor growth.  Daughter also states pt's left foot appears swollen  since this morning.

## 2022-12-20 NOTE — ED Provider Notes (Signed)
Handoff received from prior provider, with plan to follow-up workup and likely try to admit to [facility] who has been paged via the PAL line.  This is a 72 year old patient with history metastatic ovarian cancer diagnosed in 2016.  She was previously found not to be candidate for chemotherapy with count oncology due to history of Alzheimer's and schizophrenia.  Also has history of diabetes as well as DVT on Eliquis.  She briefly followed with Dr. Gery Pray with gynecologic oncology, last saw them about a year ago.  There was discussion about having her obtain a Pleurx due to recurrent ascites requiring paracentesis.  She is also briefly on hormonal therapy.  Reportedly was lost to follow-up per notes.  She presents today with her daughter.  Daughter states patient has recently had constipation, vomiting, shortness of breath and able to tolerate p.o.  No fevers.  Daughter understands patient is on call hospice but is frustrated by minimal help from her doctors in terms of keeping her comfortable.  She feels like she has not have appropriate pain control at home and is concerned.  She still wants to prioritize keeping her mom comfortable and she is her mom's POA.  We discussed goals of care, she would want patient to be DNR/DNI and is comfortable on hospice but wants her to be reevaluated by oncology preferably at [facility] to see if there is anything else they can do to help from a palliative standpoint.  I reviewed her CT scan here, she has large intra-abdominal mass as well as significant ascites.  She last had paracentesis attempted by IR a little over a week ago and they found that was not enough fluid but her abdomen has been swelling since then.  Unfortunate she is anticoagulated, with peritoneal metastasis and I do not feel comfortable or safe doing paracentesis here.  Will await reading plan to discuss with oncology.  Discussed with Dr. Lezlie Lye with gynecologic oncology at [facility]  Does not feel  patient needs to be emergently transferred and given she is on hospice feels it would be most appropriate to have her follow-up closely as an outpatient.  Dr. Venetia Maxon states she will call patient's daughter tomorrow to help arrange outpatient paracentesis and make sure she is getting the help she needs at home.  Daughter is comfortable this plan.  I discussed all the findings with the daughter including a large mass, presence of fluid in her abdomen with ascites.  She has some white blood cells and bacteria in her urine, has had some urinary frequency.  Will cover for possible UTI with antibiotics.  Signs of Pilo or sepsis.   Laurence Spates, MD 12/21/22 [phone]

## 2022-12-20 NOTE — Discharge Instructions (Signed)
The gynecologic oncology team should call you tomorrow to schedule outpatient paracentesis and follow-up.  She should continue her home medications.  Her CT scan showed some increasing size of her known abdominal mass.  She also has possible urinary tract infection is being placed on antibiotics.  She developed severe pain that cannot be controlled at home, inability to eat or drink or any other new concerning symptoms she can return to the ED.

## 2022-12-20 NOTE — ED Provider Notes (Signed)
[place] EMERGENCY DEPARTMENT AT [facility] Provider Note   CSN: [id number] Arrival date & time: 12/20/22  1355     History {Add pertinent medical, surgical, social history, OB history to HPI:1} Chief Complaint  Patient presents with   Shortness of Breath    GENESIS AYLSWORTH is a 72 y.o. female.  72 year old female with history of metastatic ovarian cancer diagnosed in 2016, recurrent malignant ascites with serial paracentesis, DVT on Eliquis, Alzheimer's dementia, schizophrenia, and diabetes who presents to the emergency department with abdominal pain, vomiting, shortness of breath, and constipation.  History obtained per the patient's wife.  She is currently on hospice through [facility].  Per her daughter for the past 4 days she has been having increasing abdominal swelling that is worse than she has ever seen it.  Says that she is now having shortness of breath as well.  Has been compliant with her Eliquis.  Says she has had a cough as well.  No fevers.  No bowel movement for the past 4 days.  Mother's been having nausea and vomiting as well.  Says that she wants a CT at this time since it has been over a year since she had 1.  Says that she was frustrated with hospice because they are not treating her cancer at all.  Says that she would also like to reestablish care with an oncologist.       Home Medications Prior to Admission medications   Medication Sig Start Date End Date Taking? Authorizing Provider  acetaminophen (TYLENOL) 325 MG tablet Take 2 tablets (650 mg total) by mouth every 8 (eight) hours as needed for mild pain or moderate pain. Patient not taking: Reported on 10/28/2021 03/02/19   Tyrone Nine, MD  albuterol (PROVENTIL) (2.5 MG/3ML) 0.083% nebulizer solution Take 2.5 mg by nebulization every 6 (six) hours as needed for wheezing or shortness of breath. 10/12/21   [provider]  apixaban (ELIQUIS) 5 MG TABS tablet Take 1 tablet (5 mg total) by mouth  2 (two) times daily. 11/11/22   Marita Kansas, PA-C  baclofen (LIORESAL) 10 MG tablet Take 10 mg by mouth daily as needed for muscle spasms.    [provider]  benzonatate (TESSALON) 200 MG capsule Take 200 mg by mouth at bedtime. 10/10/21   [provider]  benztropine (COGENTIN) 0.5 MG tablet Take 0.5 mg by mouth at bedtime. 05/20/19   [provider]  bisacodyl (DULCOLAX) 10 MG suppository Place 10 mg rectally daily as needed for moderate constipation. 10/26/21   [provider]  buPROPion (WELLBUTRIN XL) 150 MG 24 hr tablet Take 150 mg by mouth daily. 06/10/19   [provider]  diazepam (VALIUM) 5 MG tablet Take 5 mg by mouth at bedtime.    [provider]  fentaNYL (DURAGESIC) 75 MCG/HR Place 1 patch onto the skin every 3 (three) days.    [provider]  glipiZIDE (GLUCOTROL) 5 MG tablet Take 5 mg by mouth 2 (two) times daily before a meal.    [provider]  HYDROcodone-acetaminophen (NORCO/VICODIN) 5-325 MG tablet Take 1 tablet by mouth every 6 (six) hours as needed for moderate pain. Patient taking differently: Take 1 tablet by mouth every other day. 03/02/19   Tyrone Nine, MD  INVEGA SUSTENNA 234 MG/1.5ML injection Inject 234 mg into the muscle every 30 (thirty) days. 10/14/21   [provider]  linaclotide (LINZESS) 290 MCG CAPS capsule Take 290 mcg by mouth daily before breakfast.  [provider]  lubiprostone (AMITIZA) 24 MCG capsule Take 24 mcg by mouth daily.    [provider]  memantine (NAMENDA) 5 MG tablet Take 1 tablet (5 mg total) by mouth 2 (two) times daily. 03/02/19   Tyrone Nine, MD  metoCLOPramide (REGLAN) 10 MG tablet Take 10 mg by mouth 3 (three) times daily. 09/24/21   [provider]  mirtazapine (REMERON) 15 MG tablet Take 15 mg by mouth at bedtime.    [provider]  Morphine Sulfate (MORPHINE CONCENTRATE) 10 mg / 0.5 ml concentrated solution Take 5 mg  by mouth every 2 (two) hours as needed for moderate pain. 10/26/21   [provider]  omeprazole (PRILOSEC) 20 MG capsule Take 20 mg by mouth daily. 10/26/21   [provider]  ondansetron (ZOFRAN) 4 MG tablet Take 4 mg by mouth every 8 (eight) hours as needed for nausea or vomiting. 10/26/21   [provider]  polyethylene glycol powder (GLYCOLAX/MIRALAX) 17 GM/SCOOP powder Take 0.5 Containers by mouth daily as needed for moderate constipation. 10/26/21   [provider]  promethazine (PHENERGAN) 25 MG suppository Place 25 mg rectally every 6 (six) hours as needed for nausea or vomiting. 10/26/21   [provider]  promethazine (PHENERGAN) 25 MG tablet Take 25 mg by mouth every 6 (six) hours as needed for nausea or vomiting. 10/27/21   [provider]  risperiDONE (RISPERDAL) 0.5 MG tablet Take 0.5 mg by mouth 2 (two) times daily as needed (bipolar). 10/07/21   [provider]  traMADol (ULTRAM) 50 MG tablet Take 50 mg by mouth every 6 (six) hours. 10/07/21   [provider]  VENTOLIN HFA 108 (90 Base) MCG/ACT inhaler Inhale 2 puffs into the lungs every 6 (six) hours as needed for wheezing or shortness of breath. 10/17/21   [provider]      Allergies    Hydrocodone, 5-alpha reductase inhibitors, and Percocet [oxycodone-acetaminophen]    Review of Systems   Review of Systems  Physical Exam Updated Vital Signs BP (!) 159/71   Pulse 64   Resp 12   LMP  (LMP Unknown) Comment: years ago per Pt  SpO2 90%  Physical Exam Vitals and nursing note reviewed.  Constitutional:      General: She is not in acute distress.    Appearance: She is well-developed.  HENT:     Head: Normocephalic and atraumatic.     Right Ear: External ear normal.     Left Ear: External ear normal.     Nose: Nose normal.  Eyes:     Extraocular Movements: Extraocular movements intact.     Conjunctiva/sclera: Conjunctivae normal.     Pupils:  Pupils are equal, round, and reactive to light.  Cardiovascular:     Rate and Rhythm: Normal rate and regular rhythm.     Heart sounds: No murmur heard. Pulmonary:     Effort: Pulmonary effort is normal. No respiratory distress.     Breath sounds: Normal breath sounds.  Abdominal:     General: Abdomen is flat. There is distension.     Palpations: Abdomen is soft. There is no mass.     Tenderness: There is abdominal tenderness. There is no guarding.  Musculoskeletal:     Cervical back: Normal range of motion and neck supple.     Right lower leg: No edema.     Left lower leg: Edema present.  Skin:    General: Skin is warm and dry.  Neurological:     Mental Status: She is alert. Mental status is at baseline.     Cranial Nerves: No cranial nerve deficit.     Sensory: No sensory deficit.     Motor: No weakness.     Comments: Alert and oriented to self and year but not place.  No asterixis.  Psychiatric:        Mood and Affect: Mood normal.     ED Results / Procedures / Treatments   Labs (all labs ordered are listed, but only abnormal results are displayed) Labs Reviewed - No data to display  EKG None  Radiology No results found.  Procedures Procedures  {Document cardiac monitor, telemetry assessment procedure when appropriate:1}  Medications Ordered in ED Medications  albuterol (VENTOLIN HFA) 108 (90 Base) MCG/ACT inhaler 2 puff (has no administration in time range)    ED Course/ Medical Decision Making/ A&P   {   Click here for ABCD2, HEART and other calculatorsREFRESH Note before signing :1}                              Medical Decision Making Amount and/or Complexity of Data Reviewed Labs: ordered. Radiology: ordered.  Risk Prescription drug management.   ***  {Document critical care time when appropriate:1} {Document review of labs and clinical decision tools ie heart score, Chads2Vasc2 etc:1}  {Document your independent review of radiology images, and  any outside records:1} {Document your discussion with family members, caretakers, and with consultants:1} {Document social determinants of health affecting pt's care:1} {Document your decision making why or why not admission, treatments were needed:1} Final Clinical Impression(s) / ED Diagnoses Final diagnoses:  None    Rx / DC Orders ED Discharge Orders     None

## 2022-12-21 LAB — URINE CULTURE: Culture: NO GROWTH

## 2022-12-24 ENCOUNTER — Encounter (HOSPITAL_COMMUNITY): Payer: Medicare Other

## 2023-02-27 ENCOUNTER — Encounter: Payer: Self-pay | Admitting: Internal Medicine

## 2023-02-27 ENCOUNTER — Other Ambulatory Visit (HOSPITAL_COMMUNITY): Payer: Self-pay | Admitting: Internal Medicine

## 2023-02-27 DIAGNOSIS — R062 Wheezing: Secondary | ICD-10-CM

## 2023-02-27 DIAGNOSIS — R188 Other ascites: Secondary | ICD-10-CM

## 2023-02-27 DIAGNOSIS — R0602 Shortness of breath: Secondary | ICD-10-CM

## 2023-03-03 ENCOUNTER — Ambulatory Visit (HOSPITAL_COMMUNITY)
Admission: RE | Admit: 2023-03-03 | Discharge: 2023-03-03 | Disposition: A | Discharge status: Home / Self Care | Payer: Medicare Other | Source: Ambulatory Visit | Attending: Interventional Radiology | Admitting: Interventional Radiology

## 2023-03-03 DIAGNOSIS — R062 Wheezing: Secondary | ICD-10-CM | POA: Insufficient documentation

## 2023-03-03 DIAGNOSIS — R188 Other ascites: Secondary | ICD-10-CM | POA: Insufficient documentation

## 2023-03-03 DIAGNOSIS — R0602 Shortness of breath: Secondary | ICD-10-CM | POA: Diagnosis not present

## 2023-03-03 HISTORY — PX: IR PARACENTESIS: IMG2679

## 2023-03-03 MED ORDER — LIDOCAINE HCL 1 % IJ SOLN
20.0000 mL | Freq: Once | INTRAMUSCULAR | Status: AC
  Administered 2023-03-03: 10 mL via INTRADERMAL

## 2023-03-03 MED ORDER — LIDOCAINE HCL 1 % IJ SOLN
INTRAMUSCULAR | Status: AC
  Filled 2023-03-03: qty 20

## 2023-03-03 NOTE — Procedures (Signed)
PROCEDURE SUMMARY:  Successful image-guided paracentesis from the right abdomen.  Yielded 3.65 liters of clear, straw-colored peritoneal fluid.  No immediate complications.  EBL: zero Patient tolerated well.    Please see imaging section of Epic for full dictation.  Sable Feil PA-C 03/03/2023 3:56 PM

## 2023-03-03 NOTE — Procedures (Incomplete)
PROCEDURE SUMMARY:  Successful image-guided paracentesis from the left lower abdomen.  Yielded *** liters of hazy amber fluid.  No immediate complications.  EBL = trace. Patient tolerated well.   Specimen was not sent for labs.  Please see imaging section of Epic for full dictation.   Lynann Bologna Asberry Lascola PA-C 03/03/2023 2:56 PM

## 2023-04-07 ENCOUNTER — Encounter (HOSPITAL_COMMUNITY): Payer: Self-pay | Admitting: Internal Medicine

## 2023-04-07 DIAGNOSIS — R140 Abdominal distension (gaseous): Secondary | ICD-10-CM

## 2023-04-07 DIAGNOSIS — R0600 Dyspnea, unspecified: Secondary | ICD-10-CM

## 2023-04-08 ENCOUNTER — Other Ambulatory Visit (HOSPITAL_COMMUNITY): Payer: Self-pay | Admitting: Internal Medicine

## 2023-04-08 ENCOUNTER — Encounter (HOSPITAL_COMMUNITY): Payer: Self-pay | Admitting: Internal Medicine

## 2023-04-08 ENCOUNTER — Encounter: Payer: Self-pay | Admitting: Internal Medicine

## 2023-04-08 DIAGNOSIS — R188 Other ascites: Secondary | ICD-10-CM

## 2023-04-08 DIAGNOSIS — R140 Abdominal distension (gaseous): Secondary | ICD-10-CM

## 2023-04-10 ENCOUNTER — Ambulatory Visit (HOSPITAL_COMMUNITY)
Admission: RE | Admit: 2023-04-10 | Discharge: 2023-04-10 | Disposition: A | Discharge status: Home / Self Care | Source: Ambulatory Visit | Attending: Internal Medicine | Admitting: Internal Medicine

## 2023-04-10 DIAGNOSIS — R188 Other ascites: Secondary | ICD-10-CM | POA: Diagnosis present

## 2023-04-10 DIAGNOSIS — R140 Abdominal distension (gaseous): Secondary | ICD-10-CM

## 2023-04-10 HISTORY — PX: IR PARACENTESIS: IMG2679

## 2023-04-10 MED ORDER — LIDOCAINE HCL 1 % IJ SOLN
20.0000 mL | Freq: Once | INTRAMUSCULAR | Status: AC
  Administered 2023-04-10: 10 mL

## 2023-04-10 MED ORDER — LIDOCAINE HCL 1 % IJ SOLN
INTRAMUSCULAR | Status: AC
  Filled 2023-04-10: qty 20

## 2023-04-10 NOTE — Procedures (Signed)
 PROCEDURE SUMMARY:  Successful US guided paracentesis from left lateral abdomen.  Yielded 2.7 liters of yellow fluid.  No immediate complications.  Pt tolerated well.   Specimen was sent for labs.  EBL < 5mL  Hoyt Koch PA-C 04/10/2023 2:08 PM

## 2023-05-20 ENCOUNTER — Other Ambulatory Visit (HOSPITAL_COMMUNITY): Payer: Self-pay | Admitting: Internal Medicine

## 2023-05-20 DIAGNOSIS — R180 Malignant ascites: Secondary | ICD-10-CM

## 2023-05-28 ENCOUNTER — Ambulatory Visit (HOSPITAL_COMMUNITY)
Admission: RE | Admit: 2023-05-28 | Discharge: 2023-05-28 | Disposition: A | Discharge status: Home / Self Care | Source: Ambulatory Visit | Attending: Internal Medicine | Admitting: Internal Medicine

## 2023-05-28 DIAGNOSIS — C786 Secondary malignant neoplasm of retroperitoneum and peritoneum: Secondary | ICD-10-CM | POA: Insufficient documentation

## 2023-05-28 DIAGNOSIS — C562 Malignant neoplasm of left ovary: Secondary | ICD-10-CM | POA: Diagnosis present

## 2023-05-28 DIAGNOSIS — R180 Malignant ascites: Secondary | ICD-10-CM | POA: Diagnosis not present

## 2023-05-28 HISTORY — PX: IR PARACENTESIS: IMG2679

## 2023-05-28 MED ORDER — LIDOCAINE HCL 1 % IJ SOLN
INTRAMUSCULAR | Status: AC
  Filled 2023-05-28: qty 20

## 2023-05-28 MED ORDER — LIDOCAINE HCL 1 % IJ SOLN
20.0000 mL | Freq: Once | INTRAMUSCULAR | Status: AC
  Administered 2023-05-28: 10 mL via INTRADERMAL

## 2023-05-28 NOTE — Procedures (Signed)
 PROCEDURE SUMMARY:  Successful image-guided paracentesis from the left abdomen.  Yielded 3.1 liters of yellow fluid.  No immediate complications.  EBL: zero Patient tolerated well.   Specimen not sent for labs.  Please see imaging section of Epic for full dictation.  Jadia Capers NP 05/28/2023 12:38 PM

## 2023-06-22 ENCOUNTER — Emergency Department (HOSPITAL_COMMUNITY)

## 2023-06-22 ENCOUNTER — Other Ambulatory Visit: Payer: Self-pay

## 2023-06-22 ENCOUNTER — Inpatient Hospital Stay (HOSPITAL_COMMUNITY)
Admission: EM | Admit: 2023-06-22 | Discharge: 2023-06-29 | DRG: 375 | Disposition: A | Discharge status: Hospice | Attending: Internal Medicine | Admitting: Internal Medicine

## 2023-06-22 ENCOUNTER — Encounter (HOSPITAL_COMMUNITY): Payer: Self-pay | Admitting: *Deleted

## 2023-06-22 ENCOUNTER — Inpatient Hospital Stay (HOSPITAL_COMMUNITY)

## 2023-06-22 DIAGNOSIS — Z8249 Family history of ischemic heart disease and other diseases of the circulatory system: Secondary | ICD-10-CM | POA: Diagnosis not present

## 2023-06-22 DIAGNOSIS — G893 Neoplasm related pain (acute) (chronic): Secondary | ICD-10-CM | POA: Diagnosis present

## 2023-06-22 DIAGNOSIS — Z7189 Other specified counseling: Secondary | ICD-10-CM | POA: Diagnosis not present

## 2023-06-22 DIAGNOSIS — Z515 Encounter for palliative care: Secondary | ICD-10-CM

## 2023-06-22 DIAGNOSIS — C569 Malignant neoplasm of unspecified ovary: Secondary | ICD-10-CM | POA: Diagnosis present

## 2023-06-22 DIAGNOSIS — E162 Hypoglycemia, unspecified: Secondary | ICD-10-CM | POA: Diagnosis present

## 2023-06-22 DIAGNOSIS — I700 Atherosclerosis of aorta: Secondary | ICD-10-CM | POA: Diagnosis present

## 2023-06-22 DIAGNOSIS — Z833 Family history of diabetes mellitus: Secondary | ICD-10-CM | POA: Diagnosis not present

## 2023-06-22 DIAGNOSIS — F203 Undifferentiated schizophrenia: Secondary | ICD-10-CM | POA: Diagnosis present

## 2023-06-22 DIAGNOSIS — E860 Dehydration: Secondary | ICD-10-CM | POA: Diagnosis present

## 2023-06-22 DIAGNOSIS — I10 Essential (primary) hypertension: Secondary | ICD-10-CM | POA: Diagnosis present

## 2023-06-22 DIAGNOSIS — C786 Secondary malignant neoplasm of retroperitoneum and peritoneum: Principal | ICD-10-CM | POA: Diagnosis present

## 2023-06-22 DIAGNOSIS — Z7984 Long term (current) use of oral hypoglycemic drugs: Secondary | ICD-10-CM | POA: Diagnosis not present

## 2023-06-22 DIAGNOSIS — N839 Noninflammatory disorder of ovary, fallopian tube and broad ligament, unspecified: Secondary | ICD-10-CM | POA: Diagnosis present

## 2023-06-22 DIAGNOSIS — F319 Bipolar disorder, unspecified: Secondary | ICD-10-CM | POA: Diagnosis present

## 2023-06-22 DIAGNOSIS — N179 Acute kidney failure, unspecified: Secondary | ICD-10-CM | POA: Diagnosis present

## 2023-06-22 DIAGNOSIS — K5669 Other partial intestinal obstruction: Secondary | ICD-10-CM | POA: Diagnosis present

## 2023-06-22 DIAGNOSIS — Z7901 Long term (current) use of anticoagulants: Secondary | ICD-10-CM

## 2023-06-22 DIAGNOSIS — E876 Hypokalemia: Secondary | ICD-10-CM | POA: Diagnosis present

## 2023-06-22 DIAGNOSIS — K56609 Unspecified intestinal obstruction, unspecified as to partial versus complete obstruction: Secondary | ICD-10-CM

## 2023-06-22 DIAGNOSIS — D630 Anemia in neoplastic disease: Secondary | ICD-10-CM | POA: Diagnosis present

## 2023-06-22 DIAGNOSIS — Z5320 Procedure and treatment not carried out because of patient's decision for unspecified reasons: Secondary | ICD-10-CM | POA: Diagnosis not present

## 2023-06-22 DIAGNOSIS — E8809 Other disorders of plasma-protein metabolism, not elsewhere classified: Secondary | ICD-10-CM | POA: Diagnosis present

## 2023-06-22 DIAGNOSIS — I745 Embolism and thrombosis of iliac artery: Secondary | ICD-10-CM | POA: Diagnosis present

## 2023-06-22 DIAGNOSIS — F17210 Nicotine dependence, cigarettes, uncomplicated: Secondary | ICD-10-CM | POA: Diagnosis present

## 2023-06-22 DIAGNOSIS — K56600 Partial intestinal obstruction, unspecified as to cause: Principal | ICD-10-CM | POA: Diagnosis present

## 2023-06-22 DIAGNOSIS — F200 Paranoid schizophrenia: Secondary | ICD-10-CM | POA: Diagnosis present

## 2023-06-22 DIAGNOSIS — F0393 Unspecified dementia, unspecified severity, with mood disturbance: Secondary | ICD-10-CM | POA: Diagnosis present

## 2023-06-22 DIAGNOSIS — Z79899 Other long term (current) drug therapy: Secondary | ICD-10-CM | POA: Diagnosis not present

## 2023-06-22 DIAGNOSIS — R180 Malignant ascites: Secondary | ICD-10-CM | POA: Diagnosis present

## 2023-06-22 DIAGNOSIS — Z66 Do not resuscitate: Secondary | ICD-10-CM | POA: Diagnosis present

## 2023-06-22 DIAGNOSIS — Z8543 Personal history of malignant neoplasm of ovary: Secondary | ICD-10-CM

## 2023-06-22 HISTORY — PX: IR PARACENTESIS: IMG2679

## 2023-06-22 LAB — CBC
HCT: 38.5 % (ref 36.0–46.0)
Hemoglobin: 12.4 g/dL (ref 12.0–15.0)
MCH: 28.4 pg (ref 26.0–34.0)
MCHC: 32.2 g/dL (ref 30.0–36.0)
MCV: 88.3 fL (ref 80.0–100.0)
Platelets: 350 10*3/uL (ref 150–400)
RBC: 4.36 MIL/uL (ref 3.87–5.11)
RDW: 15.3 % (ref 11.5–15.5)
WBC: 10 10*3/uL (ref 4.0–10.5)
nRBC: 0 % (ref 0.0–0.2)

## 2023-06-22 LAB — COMPREHENSIVE METABOLIC PANEL WITH GFR
ALT: 18 U/L (ref 0–44)
AST: 24 U/L (ref 15–41)
Albumin: 3 g/dL — ABNORMAL LOW (ref 3.5–5.0)
Alkaline Phosphatase: 68 U/L (ref 38–126)
Anion gap: 15 (ref 5–15)
BUN: 18 mg/dL (ref 8–23)
CO2: 30 mmol/L (ref 22–32)
Calcium: 9.2 mg/dL (ref 8.9–10.3)
Chloride: 93 mmol/L — ABNORMAL LOW (ref 98–111)
Creatinine, Ser: 1.24 mg/dL — ABNORMAL HIGH (ref 0.44–1.00)
GFR, Estimated: 46 mL/min — ABNORMAL LOW (ref >60)
Glucose, Bld: 153 mg/dL — ABNORMAL HIGH (ref 70–99)
Potassium: 3.3 mmol/L — ABNORMAL LOW (ref 3.5–5.1)
Sodium: 138 mmol/L (ref 135–145)
Total Bilirubin: 0.7 mg/dL (ref 0.0–1.2)
Total Protein: 6.7 g/dL (ref 6.5–8.1)

## 2023-06-22 LAB — PROTIME-INR
INR: 1.3 — ABNORMAL HIGH (ref 0.8–1.2)
Prothrombin Time: 16.6 s — ABNORMAL HIGH (ref 11.4–15.2)

## 2023-06-22 LAB — URINALYSIS, W/ REFLEX TO CULTURE (INFECTION SUSPECTED)
Bacteria, UA: NONE SEEN
Bilirubin Urine: NEGATIVE
Glucose, UA: NEGATIVE mg/dL
Ketones, ur: NEGATIVE mg/dL
Nitrite: NEGATIVE
Protein, ur: 30 mg/dL — AB
Specific Gravity, Urine: >1.046 — ABNORMAL HIGH (ref 1.005–1.030)
pH: 6 (ref 5.0–8.0)

## 2023-06-22 LAB — I-STAT CG4 LACTIC ACID, ED: Lactic Acid, Venous: 1.3 mmol/L (ref 0.5–1.9)

## 2023-06-22 LAB — BODY FLUID CELL COUNT WITH DIFFERENTIAL
Eos, Fluid: 2 %
Lymphs, Fluid: 40 %
Monocyte-Macrophage-Serous Fluid: 54 % (ref 50–90)
Neutrophil Count, Fluid: 4 % (ref 0–25)
Total Nucleated Cell Count, Fluid: 355 uL (ref 0–1000)

## 2023-06-22 LAB — LACTATE DEHYDROGENASE, PLEURAL OR PERITONEAL FLUID: LD, Fluid: 309 U/L — ABNORMAL HIGH (ref 3–23)

## 2023-06-22 LAB — AMYLASE, PLEURAL OR PERITONEAL FLUID: Amylase, Fluid: 39 U/L

## 2023-06-22 LAB — LIPASE, BLOOD: Lipase: 22 U/L (ref 11–51)

## 2023-06-22 LAB — GLUCOSE, PLEURAL OR PERITONEAL FLUID: Glucose, Fluid: 119 mg/dL

## 2023-06-22 LAB — ALBUMIN, PLEURAL OR PERITONEAL FLUID: Albumin, Fluid: 2.8 g/dL

## 2023-06-22 MED ORDER — BENZTROPINE MESYLATE 0.5 MG PO TABS
0.5000 mg | ORAL_TABLET | Freq: Every day | ORAL | Status: DC
  Administered 2023-06-24 – 2023-06-28 (×5): 0.5 mg via ORAL
  Filled 2023-06-22 (×8): qty 1

## 2023-06-22 MED ORDER — ONDANSETRON HCL 4 MG/2ML IJ SOLN
4.0000 mg | Freq: Once | INTRAMUSCULAR | Status: AC
  Administered 2023-06-22: 4 mg via INTRAVENOUS
  Filled 2023-06-22: qty 2

## 2023-06-22 MED ORDER — METOPROLOL TARTRATE 12.5 MG HALF TABLET
12.5000 mg | ORAL_TABLET | Freq: Two times a day (BID) | ORAL | Status: DC
  Administered 2023-06-24 – 2023-06-29 (×6): 12.5 mg via ORAL
  Filled 2023-06-22 (×10): qty 1

## 2023-06-22 MED ORDER — POLYETHYLENE GLYCOL 3350 17 G PO PACK
17.0000 g | PACK | Freq: Every day | ORAL | Status: DC | PRN
Start: 2023-06-22 — End: 2023-06-26

## 2023-06-22 MED ORDER — SODIUM CHLORIDE 0.9 % IV BOLUS (SEPSIS): 1000.0000 mL | Freq: Once | INTRAVENOUS | Status: DC

## 2023-06-22 MED ORDER — MEMANTINE HCL 10 MG PO TABS
5.0000 mg | ORAL_TABLET | Freq: Two times a day (BID) | ORAL | Status: DC
  Administered 2023-06-22 – 2023-06-29 (×10): 5 mg via ORAL
  Filled 2023-06-22 (×11): qty 1

## 2023-06-22 MED ORDER — LACTATED RINGERS IV SOLN: INTRAVENOUS | Status: AC

## 2023-06-22 MED ORDER — PIPERACILLIN-TAZOBACTAM 3.375 G IVPB 30 MIN
3.3750 g | Freq: Once | INTRAVENOUS | Status: AC
  Administered 2023-06-22: 3.375 g via INTRAVENOUS
  Filled 2023-06-22: qty 50

## 2023-06-22 MED ORDER — FENTANYL 75 MCG/HR TD PT72: 1.0000 | MEDICATED_PATCH | TRANSDERMAL | Status: DC

## 2023-06-22 MED ORDER — ONDANSETRON HCL 4 MG PO TABS: 4.0000 mg | ORAL_TABLET | Freq: Four times a day (QID) | ORAL | Status: DC | PRN

## 2023-06-22 MED ORDER — MIRTAZAPINE 15 MG PO TABS
15.0000 mg | ORAL_TABLET | Freq: Every day | ORAL | Status: DC
  Administered 2023-06-24 – 2023-06-28 (×5): 15 mg via ORAL
  Filled 2023-06-22 (×5): qty 1

## 2023-06-22 MED ORDER — AMLODIPINE BESYLATE 5 MG PO TABS
5.0000 mg | ORAL_TABLET | Freq: Every day | ORAL | Status: DC
  Administered 2023-06-25: 5 mg via ORAL
  Filled 2023-06-22 (×4): qty 1

## 2023-06-22 MED ORDER — IOHEXOL 350 MG/ML SOLN
75.0000 mL | Freq: Once | INTRAVENOUS | Status: AC | PRN
  Administered 2023-06-22: 75 mL via INTRAVENOUS

## 2023-06-22 MED ORDER — SMOG ENEMA
300.0000 mL | Freq: Once | RECTAL | Status: AC
  Administered 2023-06-22: 300 mL via RECTAL
  Filled 2023-06-22: qty 960

## 2023-06-22 MED ORDER — PROCHLORPERAZINE EDISYLATE 10 MG/2ML IJ SOLN: 10.0000 mg | Freq: Once | INTRAMUSCULAR | Status: DC

## 2023-06-22 MED ORDER — DIAZEPAM 5 MG PO TABS
5.0000 mg | ORAL_TABLET | Freq: Every day | ORAL | Status: DC
  Administered 2023-06-24 – 2023-06-28 (×5): 5 mg via ORAL
  Filled 2023-06-22 (×5): qty 1

## 2023-06-22 MED ORDER — HYDRALAZINE HCL 20 MG/ML IJ SOLN: 5.0000 mg | Freq: Four times a day (QID) | INTRAMUSCULAR | Status: DC | PRN

## 2023-06-22 MED ORDER — RISPERIDONE 0.5 MG PO TABS: 0.5000 mg | ORAL_TABLET | Freq: Two times a day (BID) | ORAL | Status: DC | PRN

## 2023-06-22 MED ORDER — SODIUM CHLORIDE 0.9% FLUSH
3.0000 mL | Freq: Two times a day (BID) | INTRAVENOUS | Status: DC
  Administered 2023-06-22 – 2023-06-24 (×4): 3 mL via INTRAVENOUS

## 2023-06-22 MED ORDER — LINACLOTIDE 145 MCG PO CAPS
290.0000 ug | ORAL_CAPSULE | Freq: Every day | ORAL | Status: DC
  Administered 2023-06-22: 290 ug via ORAL
  Filled 2023-06-22 (×3): qty 2

## 2023-06-22 MED ORDER — FENTANYL 75 MCG/HR TD PT72
1.0000 | MEDICATED_PATCH | TRANSDERMAL | Status: DC
  Administered 2023-06-23 – 2023-06-26 (×2): 1 via TRANSDERMAL
  Filled 2023-06-22 (×2): qty 1

## 2023-06-22 MED ORDER — HYDROXYZINE HCL 25 MG PO TABS
25.0000 mg | ORAL_TABLET | Freq: Three times a day (TID) | ORAL | Status: DC | PRN
Start: 2023-06-22 — End: 2023-06-24

## 2023-06-22 MED ORDER — BUPROPION HCL ER (XL) 150 MG PO TB24
150.0000 mg | ORAL_TABLET | Freq: Every day | ORAL | Status: DC
  Administered 2023-06-22 – 2023-06-29 (×5): 150 mg via ORAL
  Filled 2023-06-22 (×8): qty 1

## 2023-06-22 MED ORDER — LISINOPRIL 20 MG PO TABS
10.0000 mg | ORAL_TABLET | Freq: Every day | ORAL | Status: DC
  Administered 2023-06-25: 10 mg via ORAL
  Filled 2023-06-22 (×3): qty 1

## 2023-06-22 MED ORDER — HEPARIN SODIUM (PORCINE) 5000 UNIT/ML IJ SOLN
5000.0000 [IU] | Freq: Three times a day (TID) | INTRAMUSCULAR | Status: DC
  Administered 2023-06-22 – 2023-06-23 (×2): 5000 [IU] via SUBCUTANEOUS
  Filled 2023-06-22 (×2): qty 1

## 2023-06-22 MED ORDER — FENTANYL CITRATE PF 50 MCG/ML IJ SOSY: 12.5000 ug | PREFILLED_SYRINGE | Freq: Once | INTRAMUSCULAR | Status: DC | PRN

## 2023-06-22 MED ORDER — ALBUTEROL SULFATE (2.5 MG/3ML) 0.083% IN NEBU: 2.5000 mg | INHALATION_SOLUTION | RESPIRATORY_TRACT | Status: DC | PRN

## 2023-06-22 MED ORDER — BISACODYL 10 MG RE SUPP: 10.0000 mg | Freq: Every day | RECTAL | Status: DC | PRN

## 2023-06-22 MED ORDER — BACLOFEN 10 MG PO TABS: 10.0000 mg | ORAL_TABLET | Freq: Every day | ORAL | Status: DC | PRN

## 2023-06-22 MED ORDER — KETOROLAC TROMETHAMINE 15 MG/ML IJ SOLN: 15.0000 mg | Freq: Once | INTRAMUSCULAR | Status: DC

## 2023-06-22 MED ORDER — PANTOPRAZOLE SODIUM 40 MG IV SOLR
40.0000 mg | INTRAVENOUS | Status: DC
  Administered 2023-06-23 – 2023-06-28 (×6): 40 mg via INTRAVENOUS
  Filled 2023-06-22 (×6): qty 10

## 2023-06-22 MED ORDER — LIDOCAINE HCL 1 % IJ SOLN
20.0000 mL | Freq: Once | INTRAMUSCULAR | Status: AC
  Administered 2023-06-22: 10 mL
  Filled 2023-06-22: qty 20

## 2023-06-22 MED ORDER — LUBIPROSTONE 24 MCG PO CAPS
24.0000 ug | ORAL_CAPSULE | Freq: Every day | ORAL | Status: DC
  Filled 2023-06-22: qty 1

## 2023-06-22 MED ORDER — MORPHINE SULFATE (PF) 2 MG/ML IV SOLN
1.0000 mg | INTRAVENOUS | Status: DC | PRN
  Administered 2023-06-22 – 2023-06-24 (×4): 1 mg via INTRAVENOUS
  Filled 2023-06-22 (×4): qty 1

## 2023-06-22 MED ORDER — LIDOCAINE HCL 1 % IJ SOLN
INTRAMUSCULAR | Status: AC
  Filled 2023-06-22: qty 20

## 2023-06-22 MED ORDER — PANTOPRAZOLE SODIUM 40 MG PO TBEC
40.0000 mg | DELAYED_RELEASE_TABLET | Freq: Every day | ORAL | Status: DC
  Administered 2023-06-22: 40 mg via ORAL
  Filled 2023-06-22: qty 1

## 2023-06-22 MED ORDER — ONDANSETRON HCL 4 MG/2ML IJ SOLN
4.0000 mg | Freq: Four times a day (QID) | INTRAMUSCULAR | Status: DC | PRN
  Administered 2023-06-26: 4 mg via INTRAVENOUS
  Filled 2023-06-22: qty 2

## 2023-06-22 MED ORDER — LACTATED RINGERS IV BOLUS (SEPSIS)
1000.0000 mL | Freq: Once | INTRAVENOUS | Status: AC
  Administered 2023-06-22: 1000 mL via INTRAVENOUS

## 2023-06-22 MED ORDER — DIAZEPAM 5 MG/ML IJ SOLN
2.5000 mg | Freq: Once | INTRAMUSCULAR | Status: AC | PRN
  Administered 2023-06-22: 2.5 mg via INTRAVENOUS
  Filled 2023-06-22: qty 2

## 2023-06-22 NOTE — TOC Initial Note (Addendum)
 Transition of Care [facility]) - Initial/Assessment Note    Patient Details  Name: Kristen Colon MRN: [id number] Date of Birth: 1950-04-26  Transition of Care [facility]) CM/SW Contact:    Juliane Och, LCSW Phone Number: 06/22/2023, 2:25 PM  Clinical Narrative:                  2:25 PM Per chart review, patient resides at home where she receives home hospice through [facility]. Patient is also cared for by her daughter, Kristen Colon. Patient has a PCP and insurance. [facility] and TOC will continue to follow patient.  Expected Discharge Plan: Home w Hospice Care Barriers to Discharge: Continued Medical Work up   Patient Goals and CMS Choice            Expected Discharge Plan and Services   Discharge Planning Services: CM Consult Post Acute Care Choice: Hospice                                        Prior Living Arrangements/Services     Patient language and need for interpreter reviewed:: Yes        Need for Family Participation in Patient Care: Yes (Comment) Care giver support system in place?: Yes (comment)   Criminal Activity/Legal Involvement Pertinent to Current Situation/Hospitalization: No - Comment as needed  Activities of Daily Living      Permission Sought/Granted Permission sought to share information with : Facility Medical sales representative, Family Supports Permission granted to share information with : No (Contact information on chart)  Share Information with NAME: Kristen Colon  Permission granted to share info w AGENCY: [facility]  Permission granted to share info w Relationship: Daughter  Permission granted to share info w Contact Information: [phone]  Emotional Assessment         Alcohol / Substance Use: Not Applicable Psych Involvement: No (comment)  Admission diagnosis:  Dehydration [E86.0] Malignant ascites [R18.0] Partial small bowel obstruction (HCC) [K56.600] Small bowel obstruction, partial (HCC)  [K56.600] Patient Active Problem List   Diagnosis Date Noted   Small bowel obstruction, partial (HCC) 06/22/2023   AKI (acute kidney injury) (HCC) 10/28/2021   Palliative care by specialist    Goals of care, counseling/discussion    Chronic pain due to neoplasm    Severe comorbid illness    Sepsis (HCC) 02/22/2019   Fever 02/21/2019   Pneumonia due to COVID-19 virus 01/29/2019   Acute respiratory failure with hypoxia (HCC) 01/27/2019   COVID-19 virus infection 01/27/2019   Pelvic mass in female 06/30/2017   Malignant ascites 05/12/2017   Elevated CA-125 05/12/2017   Peritoneal carcinomatosis (HCC) 05/12/2017   Left ovarian epithelial cancer (HCC) 04/14/2017   Ovarian mass 09/20/2016   Non-intractable vomiting with nausea 09/20/2016   Hypertension, uncontrolled 09/20/2016   Diabetes mellitus type 2 in nonobese (HCC) 09/20/2016   Acute encephalopathy 09/19/2016   Schizoaffective disorder, chronic condition with acute exacerbation (HCC)    Schizophrenia, undifferentiated (HCC) 06/21/2014   Delusional disorder (HCC)    Psychoses (HCC)    Gallbladder polyp 07/21/2011   Liver mass 07/21/2011   PCP:  Verlene Glimpse, NP Pharmacy:   [facility] - [place], [place] - [address] [place] [address] Phone: [phone] Fax: [phone]  [id number] - [place], [place] - [address] AT [facility] [address]  [address] [place] [address] Phone: [phone] Fax: [phone]     Social Drivers of Health (SDOH) Social History: SDOH Screenings   Food Insecurity: Food Insecurity Present (11/02/2018)   Received from [facility]  Social Connections: Unknown (06/02/2021)   Received from [facility], [facility]  Tobacco Use: High Risk (06/22/2023)   SDOH Interventions:     Readmission Risk Interventions     No data to display

## 2023-06-22 NOTE — ED Notes (Signed)
 Patients family at bedside stated patient would require sedation for NG tube placement. General Surgery provider was at bedside for the conversation and was going to speak with hospitalist. RN will also reach out to the hospitalist.   Family also educated the family member on the process of how a NG tube is placed and that we are unable to fully sedate the patient for this process.

## 2023-06-22 NOTE — ED Notes (Signed)
 Fentanyl  patch is due to be changed tomorrow

## 2023-06-22 NOTE — Hospital Course (Addendum)
 The patient is a chronically ill-appearing 73 year old female with a past medical history significant for paranoid schizophrenia, dementia, history of DVT on anticoagulant with apixaban , hypertension, history of metastatic ovarian cancer as diagnosed in 2016 who is currently on home hospice.  She was brought to the hospital given intermittent episodes of nausea vomiting with no bowel movement in about 6 days.    Subsequently she is found to have a bowel obstruction and General Surgery was consulted.  IR was consulted for paracentesis.  NG tube was placed for her partial small bowel obstruction and connected to low intermittent suction.  Per general surgery she is not a surgical candidate and most would recommend IR placement of venting G-tube if she does not improve.    Continues to have output and had 300 mL out of her NG in the canister this morning but abdomen remains distended and she is asking for diet. KUB showed a non-obstructive bowel gas pattern so will initiate small bowel protocol to see if NG can be removed. GYN oncology evaluation to see if they have any other recommendations and they will see the patient in the AM.   Assessment and Plan:  Intractable nausea and vomiting secondary to partial small bowel obstruction.  Patient also has massive ascites.  CT scan also suggest some stool in the rectal vault.  Will volume replete with IV fluids.  Correct underlying electrolyte abnormalities including hypokalemia.  Trial of smog enema will be offered.  NGT Suction. Repeat KUB in the AM. Surgery Consulted for further evaluation. Will have patient undergo SBO protocol. If fails conservative measures is not a candidate for surgical intervention and may likely need a Palliative venting g tube based on GOC Discussions.    Massive ascites likely secondary to metastatic ovarian cancer and peritoneal carcinomatosis: Previously been on letrozole  and tamoxifen in 2019 and 2021 respectively.  Patient has been  receiving periodic abdominal paracentesis.  S/p IR guided abdominal paracentesis for comfort.  Hold anticoagulation in anticipation for procedure.  Anticoagulation w/ Heparin gtt post-procedure. There has been prior discussion about having her obtain a Pleurx due to recurrent ascites requiring paracentesis but will defer to IR   Acute kidney injury most likely due to dehydration: Dehydration caused by intractable nausea and vomiting.  Patient was volume repleted with "Ringer's"  lactate at 60 mL/hr x1 day but will renew  and change to D5 /LR + 20 mEQ for another 12 hours @ 75 mL/hr. BUN/Cr went from 18/1.24 -> 11/0.84. Avoid Nephrotoxic Medications, Contrast Dyes, Hypotension and Dehydration to Ensure Adequate Renal Perfusion and will need to Renally Adjust Meds. CTM and Trend Renal Function carefully and repeat CMP in the AM    Complex cystic pelvic mass: Compatible with metastatic ovarian cancer. Patient daughter wishes to discuss options regarding treatment with oncology. 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  Patient is on chronic anticoagulation with apixaban .  Will hold anticoagulation for now due to pending abdominal paracentesis procedure and because of pSBO. Start Heparin gtt after Paracentesis and continue until SBO improves and tolerating diet  Hypoglycemia: Given NPO status. IVF as above  Normocytic Anemia/Anemia of Malignancy: Hgb/Hct is now 10.8/33.7. Check Anemia Panel in the AM. CTM for S/Sx of Bleeding as patient is on Heparin gtt for [facility]. No overt bleeding noted. Repeat CBC in the AM    Hypokalemia: Likely due to poor oral intake. K+ is  3.2. Replete w/ IV KCL 40 mEQ x1. CTM and Replete as Necessary. Repeat CMP in the AM   History of paranoid schizophrenia, bipolar disorder and cognitive impairment: Patient is on Invega  injections monthly.  Next dose on 06/21.  IV lorazepam  for  anxiety.  Able to take p.o. safely continue with mirtazapine  50 mg p.o. nightly and risperidone  0.5 mg p.o. twice daily as needed for bipolar   Essential Hypertension: Will hold home regimen (ordered on MAR though) for now due to n.p.o. status.  IV hydralazine  as needed. CTM BP per protocol.   GERD/GI prophylaxis: With IV PPI with pantoprazole  40 mg every 24  Hypoalbuminemia: Patient's Albumin  Level went from 3.0 -> 2.3. CTM and Trend and repeat CMP in the AM

## 2023-06-22 NOTE — Procedures (Signed)
 PROCEDURE SUMMARY:  Successful image-guided paracentesis from the left lower abdomen.  Yielded 3.3 liters of hazy amber fluid.  No immediate complications.  EBL = trace. Patient tolerated well.   Specimen was sent for labs.  Please see imaging section of Epic for full dictation.   Grettell Ransdell H Raychell Holcomb PA-C 06/22/2023 3:58 PM

## 2023-06-22 NOTE — ED Notes (Signed)
 Changed pt, moderate amount of stool, liquid slimy

## 2023-06-22 NOTE — ED Notes (Signed)
 Pt is requesting breakfast, she is NPO.  MD contacted by dm

## 2023-06-22 NOTE — Progress Notes (Signed)
 [name] 0[address] [facility] liaison note     This patient is a current hospice patient with [facility]. Please see media tab for detailed hospice report.    Liaison will continue to follow for any discharge planning needs and to coordinate continuation of hospice care.    Please don't hesitate to call with any Hospice related questions or concerns.    Thank you for the opportunity to participate in this patient's care.  [name], BSN, RN, OCN [employer] [phone]

## 2023-06-22 NOTE — ED Provider Notes (Signed)
 [place] EMERGENCY DEPARTMENT AT [facility] Provider Note   CSN: [id number] Arrival date & time: 06/22/23  0244     History  Chief Complaint  Patient presents with   Emesis   Level 5 caveat due to dementia and psychiatric disorder Kristen Colon is a 73 y.o. female.  The history is provided by the patient and a relative. The history is limited by the condition of the patient.  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  Over the past 3 days patient has had increasing abdominal distention and vomiting.  She is also noticed increasing difficulty breathing.  She reports at times the emesis appears dark and looks like stool.  She has had no bowel movement up to 4 days.  Patient is currently on hospice and was evaluated by nurse earlier in the day who suggested starting Phenergan . However the vomiting has continued to the patient was brought for further evaluation. Patient is not getting active treatment for her ovarian cancer, but daughter is now considering reactivating this treatment She receives therapeutic paracentesis on a monthly basis   Past Medical History:  Diagnosis Date   Anxiety    Dementia (HCC)    DVT complicating pregnancy    2023   Dyspnea    Headache    Hypertension    Schizophrenia (HCC)    paranoid    Home Medications Prior to Admission medications   Medication Sig Start Date End Date Taking? Authorizing Provider  acetaminophen  (TYLENOL ) 500 MG tablet Take 500 mg by mouth every 6 (six) hours as needed for mild pain (pain score 1-3) or moderate pain (pain score 4-6).   Yes [provider]  acyclovir (ZOVIRAX) 800 MG tablet Take 800 mg by mouth 5 (five) times daily. 02/26/23  Yes [provider]  albuterol   (ACCUNEB ) 0.63 MG/3ML nebulizer solution Take 1 ampule by nebulization every 6 (six) hours as needed for wheezing or shortness of breath. 04/21/23  Yes [provider]  amLODipine  (NORVASC ) 5 MG tablet Take 5 mg by mouth daily. 03/16/23  Yes [provider]  lactulose  (CHRONULAC ) 10 GM/15ML solution Take 10 g by mouth daily as needed for moderate constipation or mild constipation. 06/10/23  Yes [provider]  lisinopril  (ZESTRIL ) 10 MG tablet Take 10 mg by mouth daily. 04/02/23  Yes [provider]  metoprolol  tartrate (LOPRESSOR ) 25 MG tablet Take 12.5 mg by mouth 2 (two) times daily. 06/10/23  Yes [provider]  mupirocin ointment (BACTROBAN) 2 % Apply 1 Application topically 3 (three) times daily. 05/14/23  Yes [provider]  sodium phosphate  (FLEET) ENEM SMARTSIG:1 Bottle(s) Rectally Once 06/21/23  Yes [provider]  apixaban  (ELIQUIS ) 5 MG TABS tablet Take 1 tablet (5 mg total) by mouth 2 (two) times daily. 11/11/22   [name], PA-C  baclofen  (LIORESAL ) 10 MG tablet Take 10 mg by mouth daily as needed for muscle spasms.    [provider]  benztropine  (COGENTIN ) 0.5 MG tablet Take 0.5 mg by mouth at bedtime. 05/20/19   [provider]  buPROPion  (WELLBUTRIN  XL) 150 MG 24 hr tablet Take 150 mg by mouth daily. 06/10/19   [provider]  diazepam  (VALIUM ) 5 MG tablet Take 5 mg by mouth at bedtime.    [provider]  fentaNYL  (DURAGESIC ) 75  MCG/HR Place 1 patch onto the skin every 3 (three) days.    [provider]  glipiZIDE  (GLUCOTROL ) 5 MG tablet Take 5 mg by mouth 2 (two) times daily before a meal.    [provider]  HYDROcodone -acetaminophen  (NORCO/VICODIN) 5-325 MG tablet Take 1 tablet by mouth every 6 (six) hours as needed for moderate pain. Patient taking differently: Take 1 tablet by mouth every other day. 03/02/19   [name], MD  INVEGA  SUSTENNA 234 MG/1.5ML injection  Inject 234 mg into the muscle every 30 (thirty) days. 10/14/21   [provider]  linaclotide  (LINZESS ) 290 MCG CAPS capsule Take 290 mcg by mouth daily before breakfast.    [provider]  lubiprostone  (AMITIZA ) 24 MCG capsule Take 24 mcg by mouth daily.    [provider]  memantine  (NAMENDA ) 5 MG tablet Take 1 tablet (5 mg total) by mouth 2 (two) times daily. 03/02/19   [name], MD  metoCLOPramide  (REGLAN ) 10 MG tablet Take 10 mg by mouth 3 (three) times daily. 09/24/21   [provider]  mirtazapine  (REMERON ) 15 MG tablet Take 15 mg by mouth at bedtime.    [provider]  Morphine  Sulfate (MORPHINE  CONCENTRATE) 10 mg / 0.5 ml concentrated solution Take 5 mg by mouth every 2 (two) hours as needed for moderate pain. 10/26/21   [provider]  omeprazole (PRILOSEC) 20 MG capsule Take 20 mg by mouth daily. 10/26/21   [provider]  ondansetron  (ZOFRAN ) 4 MG tablet Take 4 mg by mouth every 8 (eight) hours as needed for nausea or vomiting. 10/26/21   [provider]  polyethylene glycol powder (GLYCOLAX /MIRALAX ) 17 GM/SCOOP powder Take 0.5 Containers by mouth daily as needed for moderate constipation. 10/26/21   [provider]  promethazine  (PHENERGAN ) 25 MG suppository Place 25 mg rectally every 6 (six) hours as needed for nausea or vomiting. 10/26/21   [provider]  promethazine  (PHENERGAN ) 25 MG tablet Take 25 mg by mouth every 6 (six) hours as needed for nausea or vomiting. 10/27/21   [provider]  risperiDONE  (RISPERDAL ) 0.5 MG tablet Take 0.5 mg by mouth 2 (two) times daily as needed (bipolar). 10/07/21   [provider]  traMADol  (ULTRAM ) 50 MG tablet Take 50 mg by mouth every 6 (six) hours. 10/07/21   [provider]  VENTOLIN  HFA 108 (90 Base) MCG/ACT inhaler Inhale 2 puffs into the lungs every 6 (six) hours as needed for wheezing or shortness of breath. 10/17/21   [provider]      Allergies    5-alpha reductase inhibitors    Review of Systems   Review of Systems  Unable to perform ROS: Dementia    Physical Exam Updated Vital Signs BP 102/64   Pulse (!) 109   Temp 100.2 F (37.9 C) (Rectal)   Resp 14   Ht 1.575 m (5\' 2" )   Wt 62.6 kg   LMP  (LMP Unknown) Comment: years ago per Pt  SpO2 100%   BMI 25.24 kg/m  Physical Exam CONSTITUTIONAL: Elderly and frail HEAD: Normocephalic/atraumatic EYES: EOMI/PERRL, no icterus ENMT: Mucous membranes moist, no stridor or drooling NECK: supple no meningeal signs SPINE/BACK:entire spine nontender CV: S1/S2 noted, tachycardic LUNGS: Tachypneic with decreased breath sounds bilaterally ABDOMEN: soft, distended, but no tenderness NEURO: Pt is awake/alert, moves all extremitiesx4.  No facial droop.   EXTREMITIES: pulses normal/equal, full ROM, no deformities SKIN: warm, color normal PSYCH: Flat affect  ED Results / Procedures / Treatments   Labs (all labs ordered are listed, but only abnormal results are displayed) Labs Reviewed  COMPREHENSIVE METABOLIC PANEL WITH GFR - Abnormal; Notable for the following components:      Result Value   Potassium 3.3 (*)    Chloride 93 (*)    Glucose, Bld 153 (*)    Creatinine, Ser 1.24 (*)    Albumin  3.0 (*)    GFR, Estimated 46 (*)    All other components within normal limits  PROTIME-INR - Abnormal; Notable for the following components:   Prothrombin Time 16.6 (*)    INR 1.3 (*)    All other components within normal limits  CULTURE, BLOOD (ROUTINE X 2)  CULTURE, BLOOD (ROUTINE X 2)  LIPASE, BLOOD  CBC  URINALYSIS, W/ REFLEX TO CULTURE (INFECTION SUSPECTED)  I-STAT CG4 LACTIC ACID, ED    EKG EKG Interpretation Date/Time:  Monday June 22 2023 03:51:19 EDT Ventricular Rate:  108 PR Interval:  157 QRS Duration:  82 QT Interval:  352 QTC Calculation: 472 R Axis:   0  Text Interpretation: Sinus tachycardia Low voltage, extremity leads  Confirmed by Eldon Greenland ([id number]) on 06/22/2023 3:58:28 AM  Radiology CT ABDOMEN PELVIS W CONTRAST Result Date: 06/22/2023 CLINICAL DATA:  Evaluate for bowel obstruction. History of ovarian cancer. Complains of abdominal pain and distension. * Tracking Code: BO * EXAM: CT ABDOMEN AND PELVIS WITH CONTRAST TECHNIQUE: Multidetector CT imaging of the abdomen and pelvis was performed using the standard protocol following bolus administration of intravenous contrast. RADIATION DOSE REDUCTION: This exam was performed according to the departmental dose-optimization program which includes automated exposure control, adjustment of the mA and/or kV according to patient size and/or use of iterative reconstruction technique. CONTRAST:  75mL OMNIPAQUE  IOHEXOL  350 MG/ML SOLN COMPARISON:  12/20/2022. FINDINGS: Lower chest: No pleural effusion or airspace consolidation. Hepatobiliary: Previous hemangioma within the lateral segment of left lobe of liver measures 0.9 cm, unchanged from previous exam. Small low-density structure within the inferior right lobe of liver measures 7 mm, image 28/3. Also unchanged. The gallbladder appears decompressed containing multiple stones measuring up to 5 mm, image 23/3. Common bile duct measures up to 6 mm. Pancreas: Unremarkable. No pancreatic ductal dilatation or surrounding inflammatory changes. Spleen: Normal in size without focal abnormality. Adrenals/Urinary Tract: Normal appearance of the adrenal glands. No nephrolithiasis, hydronephrosis or suspicious mass. Urinary bladder appears within normal limits. Stomach/Bowel: Stomach appears normal. The small bowel loops within the left hemiabdomen are dilated with air-fluid levels measuring up to 3.8 cm, image 63/3. Transition to decreased caliber distal small bowel noted within the left hemiabdomen, image 38/6. The colon is nondilated. Gas and stool noted up to the level of the rectum. Vascular/Lymphatic: Aortic atherosclerosis. No aneurysm.  No abdominopelvic adenopathy. Chronic occlusion of the left common iliac artery. Reproductive: The complex cystic mass within the left paramidline pelvis measures 13.9 x 12.0 cm, image 70/3. Previously 13.4 by 11.5 cm. The complex cystic mass within the right abdomen measures 22.7 x 16.8 cm, image 51/3. Previously 21.4 by 16.9 cm. Other: Mild increase in moderate volume abdominopelvic ascites. Scattered peritoneal nodules are again seen. -Index nodule within the midline ventral abdomen measures 0.7 cm, image 32/3. Previously 0.6 cm. -Cystic peritoneal nodule within the ventral abdomen measures 2.7 x 2.3 cm, image 45/3. Previously this measured the same. Musculoskeletal: No acute or significant osseous findings. IMPRESSION: 1. The small bowel loops within the left hemiabdomen are dilated with air-fluid levels measuring up  to 3.8 cm. Transition to decreased caliber distal small bowel noted within the left hemiabdomen. Findings are compatible with partial small bowel obstruction. 2. Mild increase in moderate volume abdominopelvic ascites. 3. Stable appearance of complex cystic masses within the pelvis and right abdomen compatible with history of ovarian cancer. 4. Stable appearance of peritoneal nodules compatible with peritoneal carcinomatosis. 5. Cholelithiasis. 6. Chronic occlusion of the left common iliac artery. 7.  Aortic Atherosclerosis (ICD10-I70.0). Electronically Signed   By: Kimberley Penman M.D.   On: 06/22/2023 05:33   DG Abdomen Acute W/Chest Result Date: 06/22/2023 EXAM: XR Acute Abdomen Series with XR Chest, upright and supine views of the abdomen, frontal view of the chest. CLINICAL HISTORY: Vomiting. COMPARISON: CTA chest, CT abdomen/pelvis dated 12/20/2022. FINDINGS: LUNGS: Increased interstitial opacities with patchy bibasilar opacities, some of which may be chronic, but superimposed mild infection/pneumonia is not excluded. PLEURAL SPACES: No pleural effusion. No pneumothorax. HEART: The heart size  is normal. MEDIASTINUM: Thoracic aortic atherosclerosis. PERITONEUM: No appreciable free air. BOWEL: Mildly dilated loops of small bowel in the left mid abdomen, nonspecific. Small bowel obstruction not occluded. BONES: No acute osseous abnormality. IMPRESSION: 1. Mildly dilated loops of small bowel in the left mid abdomen, nonspecific, small bowel obstruction not excluded. 2. Increased interstitial opacities with patchy bibasilar opacities, some of which may be chronic, but superimposed mild infection/pneumonia is not excluded. Electronically signed by: Zadie Herter MD 06/22/2023 03:59 AM EDT RP Workstation: [id number]    Procedures .Critical Care  Performed by: Eldon Greenland, MD Authorized by: Eldon Greenland, MD   Critical care provider statement:    Critical care time (minutes):  45   Critical care start time:  06/22/2023 5:15 AM   Critical care end time:  06/22/2023 6:00 AM   Critical care time was exclusive of:  Separately billable procedures and treating other patients   Critical care was necessary to treat or prevent imminent or life-threatening deterioration of the following conditions:  Sepsis, shock, metabolic crisis and dehydration   Critical care was time spent personally by me on the following activities:  Obtaining history from patient or surrogate, examination of patient, evaluation of patient's response to treatment, pulse oximetry, re-evaluation of patient's condition, ordering and review of radiographic studies, ordering and review of laboratory studies, development of treatment plan with patient or surrogate and review of old charts   I assumed direction of critical care for this patient from another provider in my specialty: no     Care discussed with: admitting provider       Medications Ordered in ED Medications  heparin injection 5,000 Units (has no administration in time range)  sodium chloride  flush (NS) 0.9 % injection 3 mL (has no administration in time range)   lactated ringers  infusion (has no administration in time range)  bisacodyl  (DULCOLAX) suppository 10 mg (has no administration in time range)  ondansetron  (ZOFRAN ) tablet 4 mg (has no administration in time range)    Or  ondansetron  (ZOFRAN ) injection 4 mg (has no administration in time range)  albuterol  (PROVENTIL ) (2.5 MG/3ML) 0.083% nebulizer solution 2.5 mg (has no administration in time range)  hydrALAZINE  (APRESOLINE ) injection 5 mg (has no administration in time range)  ondansetron  (ZOFRAN ) injection 4 mg (4 mg Intravenous Given 06/22/23 0444)  lactated ringers  bolus 1,000 mL (0 mLs Intravenous Stopped 06/22/23 0554)  piperacillin-tazobactam (ZOSYN) IVPB 3.375 g (0 g Intravenous Stopped 06/22/23 0554)  iohexol  (OMNIPAQUE ) 350 MG/ML injection 75 mL (75 mLs Intravenous Contrast Given 06/22/23 0513)  ED Course/ Medical Decision Making/ A&P Clinical Course as of 06/22/23 0617  Mon Jun 22, 2023  0322 Patient with complicated history including metastatic ovarian cancer currently on hospice presents with recurrent abdominal distention and vomiting. Labs and imaging pending this time.  Patient is a DNR with limited interventions requested [DW]  0346 Creatinine(!): 1.24 Renal insufficiency  [DW]  0415 Per nursing staff, no blood or melena on rectal exam.  Patient has a temp of 100.2.  Sepsis suspected, IV fluids been ordered  Patient likely has small bowel obstruction given x-ray findings, will proceed with CT imaging [DW]  0614 CT imaging reveals partial small bowel obstruction, ascites and stable mass  Overall patient is improving, vital signs are appropriate.  She is not vomiting  Will plan for admission, would benefit from paracentesis.  Daughter is also interested in talking to oncology or palliative care about any palliative measures for her  Will defer NG tube for now since she is not vomiting [DW]  0615 D/w dr Cicero Crawley for admission [DW]    Clinical Course User Index [DW]  Eldon Greenland, MD                                 Medical Decision Making Amount and/or Complexity of Data Reviewed Labs: ordered. Decision-making details documented in ED Course. Radiology: ordered. ECG/medicine tests: ordered.  Risk Prescription drug management. Decision regarding hospitalization.   This patient presents to the ED for concern of vomiting, this involves an extensive number of treatment options, and is a complaint that carries with it a high risk of complications and morbidity.  The differential diagnosis includes but is not limited to, closest-itis pancreatitis, gastritis, peptic ulcer disease, bowel obstruction, bowel perforation, diverticulitis,  ischemic bowel, acute coronary syndrome    Comorbidities that complicate the patient evaluation: Patient's presentation is complicated by their history of ovarian cancer, schizophrenia, VTE  Social Determinants of Health: Patient's tobacco use  increases the complexity of managing their presentation  Additional history obtained: Additional history obtained from family Records reviewed previous admission documents  Lab Tests: I Ordered, and personally interpreted labs.  The pertinent results include: Mild renal insufficiency  Imaging Studies ordered: I ordered imaging studies including X-ray acute abdominal series  I independently visualized and interpreted imaging which showed partial small bowel obstruction I agree with the radiologist interpretation  Cardiac Monitoring: The patient was maintained on a cardiac monitor.  I personally viewed and interpreted the cardiac monitor which showed an underlying rhythm of:  sinus tachycardia  Medicines ordered and prescription drug management: I ordered medication including IV fluids for tachycardia Reevaluation of the patient after these medicines showed that the patient    improved   Critical Interventions:   IV fluids, IV antibiotics  Consultations Obtained: I  requested consultation with the admitting physician [facility], and discussed  findings as well as pertinent plan - they recommend: Admit  Reevaluation: After the interventions noted above, I reevaluated the patient and found that they have :improved  Complexity of problems addressed: Patient's presentation is most consistent with  acute presentation with potential threat to life or bodily function  Disposition: After consideration of the diagnostic results and the patient's response to treatment,  I feel that the patent would benefit from admission  .           Final Clinical Impression(s) / ED Diagnoses Final diagnoses:  Partial small bowel obstruction (HCC)  Dehydration  Malignant ascites    Rx / DC Orders ED Discharge Orders     None         Eldon Greenland, MD 06/22/23 [phone]

## 2023-06-22 NOTE — Progress Notes (Signed)
 PROGRESS NOTE    Kristen Colon  AOZ:[id number] DOB: [birth year] DOA: 06/22/2023 PCP: Verlene Glimpse, NP   Brief Narrative:  Care started after midnight and she presented to the ED early this morning and patient was admitted early this morning after midnight by Dr. Irving Mantle and I am in current agreement with his assessment and plan.  Additional changes to the plan of care been made accordingly.  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  She was brought to the hospital given intermittent episodes of nausea vomiting with no bowel movement in about 6 days.  She was unable to keep any of her food down and vomited all day yesterday and vomitus was brownish-black.  Despite promethazine  symptoms persisted so daughter brought her in for further evaluation and she is found to have a partial small bowel obstruction likely in the setting of her malignancy.  There is also evidence of worsening ascites and ovarian mass so General Surgery and GYN-Onc were consulted.  Interventional radiology was consulted for IR paracentesis and she underwent this today.  NG tube was placed for her partial small bowel obstruction and connected to low intermittent suction.  Per general surgery she is not a surgical candidate and most would recommend IR placement of venting G-tube if she does not improve.  The paracentesis has been sent off for fluid analysis and this is pending.   Will continue to follow-up on specialist recommendations continue supportive care as patient remains on hospice and the hospice liaison has been consulted as well.  Will continue monitor and repeat blood work in the a.m. and follow-up with the specialist recommendations.   DVT prophylaxis:  heparin injection 5,000 Units Start: 06/22/23 1400 SCDs Start: 06/22/23 0610    Code Status: Do not attempt resuscitation (DNR) PRE-ARREST INTERVENTIONS DESIRED Family Communication: Discussed with daughter at bedside  Disposition Plan:  Level of care: Med-Surg Status is: Inpatient Remains inpatient appropriate because: Further clinical improvement and stabilization  Consultants:  General Surgery Interventional radiology Gynecology-oncology  Procedures:  NG tube placement, paracentesis  Antimicrobials:  Anti-infectives (From admission, onward)    Start     Dose/Rate Route Frequency Ordered Stop   06/22/23 0430  piperacillin-tazobactam (ZOSYN) IVPB 3.375 g        3.375 g 100 mL/hr over 30 Minutes Intravenous  Once 06/22/23 0417 06/22/23 0554       Objective: Vitals:   06/22/23 1215 06/22/23 1227 06/22/23 1341 06/22/23 1713  BP: 108/73 108/72 107/78 (!) 107/59  Pulse: (!) 102 (!) 103 94 91  Resp: 12 18 17 17   Temp:  98.5 F (36.9 C) 98.1 F (36.7 C) (!) 97.4 F (36.3 C)  TempSrc:  Oral    SpO2: 98% 99% 97% 100%  Weight:      Height:        Intake/Output Summary (Last 24 hours) at 06/22/2023 1904 Last data filed at 06/22/2023 0743 Gross per 24 hour  Intake 48.04 ml  Output --  Net 48.04 ml   Filed Weights   06/22/23 0258  Weight: 62.6 kg   Data Reviewed: I have personally reviewed following labs and imaging studies  CBC: Recent Labs  Lab 06/22/23 0305  WBC 10.0  HGB 12.4  HCT 38.5  MCV 88.3  PLT 350  Basic Metabolic Panel: Recent Labs  Lab 06/22/23 0305  NA 138  K 3.3*  CL 93*  CO2 30  GLUCOSE 153*  BUN 18  CREATININE 1.24*  CALCIUM 9.2   GFR: Estimated Creatinine Clearance: 35.1 mL/min (A) (by C-G formula based on SCr of 1.24 mg/dL (H)). Liver Function Tests: Recent Labs  Lab 06/22/23 0305  AST 24  ALT 18  ALKPHOS 68  BILITOT 0.7  PROT 6.7  ALBUMIN  3.0*   Recent Labs  Lab 06/22/23 0305  LIPASE 22   No results for input(s):  "AMMONIA" in the last 168 hours. Coagulation Profile: Recent Labs  Lab 06/22/23 0418  INR 1.3*   Cardiac Enzymes: No results for input(s): "CKTOTAL", "CKMB", "CKMBINDEX", "TROPONINI" in the last 168 hours. BNP (last 3 results) No results for input(s): "PROBNP" in the last 8760 hours. HbA1C: No results for input(s): "HGBA1C" in the last 72 hours. CBG: No results for input(s): "GLUCAP" in the last 168 hours. Lipid Profile: No results for input(s): "CHOL", "HDL", "LDLCALC", "TRIG", "CHOLHDL", "LDLDIRECT" in the last 72 hours. Thyroid  Function Tests: No results for input(s): "TSH", "T4TOTAL", "FREET4", "T3FREE", "THYROIDAB" in the last 72 hours. Anemia Panel: No results for input(s): "VITAMINB12", "FOLATE", "FERRITIN", "TIBC", "IRON", "RETICCTPCT" in the last 72 hours. Sepsis Labs: Recent Labs  Lab 06/22/23 0427  LATICACIDVEN 1.3   No results found for this or any previous visit (from the past 240 hours).   Radiology Studies: CT ABDOMEN PELVIS W CONTRAST Result Date: 06/22/2023 CLINICAL DATA:  Evaluate for bowel obstruction. History of ovarian cancer. Complains of abdominal pain and distension. * Tracking Code: BO * EXAM: CT ABDOMEN AND PELVIS WITH CONTRAST TECHNIQUE: Multidetector CT imaging of the abdomen and pelvis was performed using the standard protocol following bolus administration of intravenous contrast. RADIATION DOSE REDUCTION: This exam was performed according to the departmental dose-optimization program which includes automated exposure control, adjustment of the mA and/or kV according to patient size and/or use of iterative reconstruction technique. CONTRAST:  75mL OMNIPAQUE  IOHEXOL  350 MG/ML SOLN COMPARISON:  12/20/2022. FINDINGS: Lower chest: No pleural effusion or airspace consolidation. Hepatobiliary: Previous hemangioma within the lateral segment of left lobe of liver measures 0.9 cm, unchanged from previous exam. Small low-density structure within the inferior right  lobe of liver measures 7 mm, image 28/3. Also unchanged. The gallbladder appears decompressed containing multiple stones measuring up to 5 mm, image 23/3. Common bile duct measures up to 6 mm. Pancreas: Unremarkable. No pancreatic ductal dilatation or surrounding inflammatory changes. Spleen: Normal in size without focal abnormality. Adrenals/Urinary Tract: Normal appearance of the adrenal glands. No nephrolithiasis, hydronephrosis or suspicious mass. Urinary bladder appears within normal limits. Stomach/Bowel: Stomach appears normal. The small bowel loops within the left hemiabdomen are dilated with air-fluid levels measuring up to 3.8 cm, image 63/3. Transition to decreased caliber distal small bowel noted within the left hemiabdomen, image 38/6. The colon is nondilated. Gas and stool noted up to the level of the rectum. Vascular/Lymphatic: Aortic atherosclerosis. No aneurysm. No abdominopelvic adenopathy. Chronic occlusion of the left common iliac artery. Reproductive: The complex cystic mass within the left paramidline pelvis measures 13.9 x 12.0 cm, image 70/3. Previously 13.4 by 11.5 cm. The complex cystic mass within the right abdomen measures 22.7 x 16.8 cm, image 51/3. Previously 21.4 by 16.9 cm. Other: Mild increase in moderate volume abdominopelvic ascites. Scattered peritoneal nodules are again seen. -Index nodule within the midline ventral abdomen measures 0.7 cm, image 32/3. Previously 0.6 cm. -Cystic peritoneal nodule  within the ventral abdomen measures 2.7 x 2.3 cm, image 45/3. Previously this measured the same. Musculoskeletal: No acute or significant osseous findings. IMPRESSION: 1. The small bowel loops within the left hemiabdomen are dilated with air-fluid levels measuring up to 3.8 cm. Transition to decreased caliber distal small bowel noted within the left hemiabdomen. Findings are compatible with partial small bowel obstruction. 2. Mild increase in moderate volume abdominopelvic ascites. 3.  Stable appearance of complex cystic masses within the pelvis and right abdomen compatible with history of ovarian cancer. 4. Stable appearance of peritoneal nodules compatible with peritoneal carcinomatosis. 5. Cholelithiasis. 6. Chronic occlusion of the left common iliac artery. 7.  Aortic Atherosclerosis (ICD10-I70.0). Electronically Signed   By: Kimberley Penman M.D.   On: 06/22/2023 05:33   DG Abdomen Acute W/Chest Result Date: 06/22/2023 EXAM: XR Acute Abdomen Series with XR Chest, upright and supine views of the abdomen, frontal view of the chest. CLINICAL HISTORY: Vomiting. COMPARISON: CTA chest, CT abdomen/pelvis dated 12/20/2022. FINDINGS: LUNGS: Increased interstitial opacities with patchy bibasilar opacities, some of which may be chronic, but superimposed mild infection/pneumonia is not excluded. PLEURAL SPACES: No pleural effusion. No pneumothorax. HEART: The heart size is normal. MEDIASTINUM: Thoracic aortic atherosclerosis. PERITONEUM: No appreciable free air. BOWEL: Mildly dilated loops of small bowel in the left mid abdomen, nonspecific. Small bowel obstruction not occluded. BONES: No acute osseous abnormality. IMPRESSION: 1. Mildly dilated loops of small bowel in the left mid abdomen, nonspecific, small bowel obstruction not excluded. 2. Increased interstitial opacities with patchy bibasilar opacities, some of which may be chronic, but superimposed mild infection/pneumonia is not excluded. Electronically signed by: Sriyesh Krishnan MD 06/22/2023 03:59 AM EDT RP Workstation: [id number]   Scheduled Meds:  amLODipine   5 mg Oral Daily   benztropine   0.5 mg Oral QHS   buPROPion   150 mg Oral Daily   diazepam   5 mg Oral QHS   [START ON 06/23/2023] fentaNYL   1 patch Transdermal Q72H   heparin  5,000 Units Subcutaneous Q8H   linaclotide   290 mcg Oral QAC breakfast   lisinopril   10 mg Oral Daily   memantine   5 mg Oral BID   metoprolol  tartrate  12.5 mg Oral BID   mirtazapine   15 mg Oral QHS    pantoprazole   40 mg Oral Daily   sodium chloride  flush  3 mL Intravenous Q12H   Continuous Infusions:  lactated ringers  60 mL/hr at 06/22/23 0743    LOS: 0 days   Aura Leeds, DO [facility] Available via Epic secure chat 7am-7pm After these hours, please refer to coverage provider listed on [email or web address] 06/22/2023, 7:04 PM

## 2023-06-22 NOTE — ED Triage Notes (Signed)
 The pt has cancer in her ovaries vomiting for 2-3 days with some difficulty breathing   distended abd normal

## 2023-06-22 NOTE — Consult Note (Signed)
 Consult Note  Kristen Colon Sep 17, 1950  [id number].    Requesting MD: Eveline Hipps, DO Chief Complaint/Reason for Consult: PSBO HPI:  Patient is a 73 year old female who presented to the ED with vomiting. She has PMH significant for metastatic ovarian cancer diagnosed in 2016. She has been on home hospice and receiving periodic paracentesis. Daughter who cares for her at home noted worsening abdominal distention and constipation over the last several days. Intermittent nausea and vomiting over the last 4 days. This morning unable to tolerate any PO and emesis persistent despite promethazine  at home. No reported abdominal pain, fever, chills. No BM in the last 4 days. PMH otherwise significant for paranoid schizophrenia, dementia, DVT on Eliquis , HTN. Only prior abdominal surgery is diagnostic laparoscopy in 2016 by Dr. Pearly Bound. Patient's daughter did not feel she was well cared for in [facility] system in 2016 and had transitioned her care to [facility] at [facility].   Patient's daughter prefers not to bring up patient's severity of cancer in front of her because it causes her to become agitated. She does mention that she wants to provide her mother with appropriate care but does not want to prolong any suffering.   ROS: Negative other than HPI  Family History  Problem Relation Age of Onset   Heart disease Mother    Diabetes Sister     Past Medical History:  Diagnosis Date   Anxiety    Dementia (HCC)    DVT complicating pregnancy    2023   Dyspnea    Headache    Hypertension    Schizophrenia (HCC)    paranoid    Past Surgical History:  Procedure Laterality Date   DIAGNOSTIC LAPAROSCOPY     pelvic mass Dr. Pearly Bound 06-30-17   IR PARACENTESIS  03/03/2023   IR PARACENTESIS  04/10/2023   IR PARACENTESIS  05/28/2023   LAPAROSCOPY N/A 06/30/2017   Procedure: LAPAROSCOPY DIAGNOSTIC WITH BIOPSIES;  Surgeon: Alphonso Aschoff, MD;  Location: [facility];  Service: Gynecology;  Laterality: N/A;     Social History:  reports that she has been smoking cigarettes. She has a 5 pack-year smoking history. She has never used smokeless tobacco. She reports that she does not currently use alcohol. She reports that she does not currently use drugs after having used the following drugs: Marijuana.  Allergies:  Allergies  Allergen Reactions   Pork-Derived Products     Medications Prior to Admission  Medication Sig Dispense Refill   acetaminophen  (TYLENOL ) 500 MG tablet Take 500 mg by mouth every 6 (six) hours as needed for mild pain (pain score 1-3) or moderate pain (pain score 4-6).     acyclovir (ZOVIRAX) 800 MG tablet Take 800 mg by mouth 5 (five) times daily.     albuterol  (ACCUNEB ) 0.63 MG/3ML nebulizer solution Take 1 ampule by nebulization every 6 (six) hours as needed for wheezing or shortness of breath.     amLODipine  (NORVASC ) 5 MG tablet Take 5 mg by mouth daily.     apixaban  (ELIQUIS ) 5 MG TABS tablet Take 1 tablet (5 mg total) by mouth 2 (two) times daily. 60 tablet 0   baclofen  (LIORESAL ) 10 MG tablet Take 10 mg by mouth 2 (two) times daily.     benztropine  (COGENTIN ) 0.5 MG tablet Take 0.5 mg by mouth at bedtime.     buPROPion  (WELLBUTRIN  XL) 150 MG 24 hr tablet Take 150 mg by mouth daily.     diazepam  (VALIUM ) 5  MG tablet Take 5 mg by mouth at bedtime.     fentaNYL  (DURAGESIC ) 75 MCG/HR Place 1 patch onto the skin every 3 (three) days.     glipiZIDE  (GLUCOTROL ) 5 MG tablet Take 5 mg by mouth 2 (two) times daily before a meal.     HYDROcodone -acetaminophen  (NORCO/VICODIN) 5-325 MG tablet Take 1 tablet by mouth every 6 (six) hours as needed for moderate pain. 5 tablet 0   INVEGA  SUSTENNA 234 MG/1.5ML injection Inject 234 mg into the muscle every 30 (thirty) days.     lactulose  (CHRONULAC ) 10 GM/15ML solution Take 10 g by mouth daily as needed for moderate constipation or mild constipation.     linaclotide  (LINZESS ) 290 MCG CAPS capsule Take 290 mcg by mouth daily before  breakfast.     lisinopril  (ZESTRIL ) 10 MG tablet Take 10 mg by mouth daily.     memantine  (NAMENDA ) 5 MG tablet Take 1 tablet (5 mg total) by mouth 2 (two) times daily. 60 tablet 0   metoCLOPramide  (REGLAN ) 10 MG tablet Take 10 mg by mouth in the morning.     metoprolol  tartrate (LOPRESSOR ) 25 MG tablet Take 12.5 mg by mouth 2 (two) times daily.     mirtazapine  (REMERON ) 15 MG tablet Take 15 mg by mouth at bedtime.     mupirocin ointment (BACTROBAN) 2 % Apply 1 Application topically 3 (three) times daily.     omeprazole (PRILOSEC) 20 MG capsule Take 20 mg by mouth daily.     ondansetron  (ZOFRAN ) 4 MG tablet Take 4 mg by mouth every 8 (eight) hours as needed for nausea or vomiting.     polyethylene glycol powder (GLYCOLAX /MIRALAX ) 17 GM/SCOOP powder Take 17 g by mouth daily as needed for moderate constipation.     promethazine  (PHENERGAN ) 25 MG tablet Take 25 mg by mouth every 6 (six) hours as needed for nausea or vomiting.     risperiDONE  (RISPERDAL ) 0.5 MG tablet Take 0.5 mg by mouth at bedtime.     traMADol  (ULTRAM ) 50 MG tablet Take 50 mg by mouth 2 (two) times daily.      Blood pressure 107/78, pulse 94, temperature 98.1 F (36.7 C), resp. rate 17, height 5\' 2"  (1.575 m), weight 62.6 kg, SpO2 97%. Physical Exam:  General: pleasant, WD, chronically ill appearing female who is laying in bed in NAD HEENT: head is normocephalic, atraumatic.  EOMI Heart: regular, rate, and rhythm.   Lungs: Respiratory effort nonlabored Abd: soft, NT, distended MS: all 4 extremities are symmetrical with no cyanosis, clubbing, or edema. Skin: warm and dry with no masses, lesions, or rashes Neuro: non-focal exam    Results for orders placed or performed during the hospital encounter of 06/22/23 (from the past 48 hours)  Lipase, blood     Status: None   Collection Time: 06/22/23  3:05 AM  Result Value Ref Range   Lipase 22 11 - 51 U/L    Comment: Performed at [facility], [address]  [place], [place] [address]  Comprehensive metabolic panel     Status: Abnormal   Collection Time: 06/22/23  3:05 AM  Result Value Ref Range   Sodium 138 135 - 145 mmol/L   Potassium 3.3 (L) 3.5 - 5.1 mmol/L   Chloride 93 (L) 98 - 111 mmol/L   CO2 30 22 - 32 mmol/L   Glucose, Bld 153 (H) 70 - 99 mg/dL    Comment: Glucose reference range applies only to samples taken after fasting for  at least 8 hours.   BUN 18 8 - 23 mg/dL   Creatinine, Ser 6.04 (H) 0.44 - 1.00 mg/dL   Calcium 9.2 8.9 - 54.0 mg/dL   Total Protein 6.7 6.5 - 8.1 g/dL   Albumin  3.0 (L) 3.5 - 5.0 g/dL   AST 24 15 - 41 U/L   ALT 18 0 - 44 U/L   Alkaline Phosphatase 68 38 - 126 U/L   Total Bilirubin 0.7 0.0 - 1.2 mg/dL   GFR, Estimated 46 (L) >60 mL/min    Comment: (NOTE) Calculated using the CKD-EPI Creatinine Equation (2021)    Anion gap 15 5 - 15    Comment: Performed at [facility], [address], [place], [place] [address]  CBC     Status: None   Collection Time: 06/22/23  3:05 AM  Result Value Ref Range   WBC 10.0 4.0 - 10.5 K/uL   RBC 4.36 3.87 - 5.11 MIL/uL   Hemoglobin 12.4 12.0 - 15.0 g/dL   HCT 14.7 82.9 - 56.2 %   MCV 88.3 80.0 - 100.0 fL   MCH 28.4 26.0 - 34.0 pg   MCHC 32.2 30.0 - 36.0 g/dL   RDW 13.0 86.5 - 78.4 %   Platelets 350 150 - 400 K/uL   nRBC 0.0 0.0 - 0.2 %    Comment: Performed at [facility], [address] [place], [place] [address]  Protime-INR     Status: Abnormal   Collection Time: 06/22/23  4:18 AM  Result Value Ref Range   Prothrombin Time 16.6 (H) 11.4 - 15.2 seconds   INR 1.3 (H) 0.8 - 1.2    Comment: (NOTE) INR goal varies based on device and disease states. Performed at [facility], [address] [place], [place] [address]   I-Stat Lactic Acid, ED     Status: None   Collection Time: 06/22/23  4:27 AM  Result Value Ref Range   Lactic Acid, Venous 1.3 0.5 - 1.9 mmol/L  Urinalysis, w/ Reflex to Culture (Infection Suspected) -Urine, Clean Catch      Status: Abnormal   Collection Time: 06/22/23  7:29 AM  Result Value Ref Range   Specimen Source URINE, CLEAN CATCH    Color, Urine YELLOW YELLOW   APPearance HAZY (A) CLEAR   Specific Gravity, Urine >1.046 (H) 1.005 - 1.030   pH 6.0 5.0 - 8.0   Glucose, UA NEGATIVE NEGATIVE mg/dL   Hgb urine dipstick SMALL (A) NEGATIVE   Bilirubin Urine NEGATIVE NEGATIVE   Ketones, ur NEGATIVE NEGATIVE mg/dL   Protein, ur 30 (A) NEGATIVE mg/dL   Nitrite NEGATIVE NEGATIVE   Leukocytes,Ua LARGE (A) NEGATIVE   RBC / HPF 11-20 0 - 5 RBC/hpf   WBC, UA 21-50 0 - 5 WBC/hpf    Comment:        Reflex urine culture not performed if WBC <=10, OR if Squamous epithelial cells >5. If Squamous epithelial cells >5 suggest recollection.    Bacteria, UA NONE SEEN NONE SEEN   Squamous Epithelial / HPF 0-5 0 - 5 /HPF   Mucus PRESENT     Comment: Performed at [facility], [address], [place], [place] [address]   CT ABDOMEN PELVIS W CONTRAST Result Date: 06/22/2023 CLINICAL DATA:  Evaluate for bowel obstruction. History of ovarian cancer. Complains of abdominal pain and distension. * Tracking Code: BO * EXAM: CT ABDOMEN AND PELVIS WITH CONTRAST TECHNIQUE: Multidetector CT imaging of the  abdomen and pelvis was performed using the standard protocol following bolus administration of intravenous contrast. RADIATION DOSE REDUCTION: This exam was performed according to the departmental dose-optimization program which includes automated exposure control, adjustment of the mA and/or kV according to patient size and/or use of iterative reconstruction technique. CONTRAST:  75mL OMNIPAQUE  IOHEXOL  350 MG/ML SOLN COMPARISON:  12/20/2022. FINDINGS: Lower chest: No pleural effusion or airspace consolidation. Hepatobiliary: Previous hemangioma within the lateral segment of left lobe of liver measures 0.9 cm, unchanged from previous exam. Small low-density structure within the inferior right lobe of liver measures 7 mm, image 28/3.  Also unchanged. The gallbladder appears decompressed containing multiple stones measuring up to 5 mm, image 23/3. Common bile duct measures up to 6 mm. Pancreas: Unremarkable. No pancreatic ductal dilatation or surrounding inflammatory changes. Spleen: Normal in size without focal abnormality. Adrenals/Urinary Tract: Normal appearance of the adrenal glands. No nephrolithiasis, hydronephrosis or suspicious mass. Urinary bladder appears within normal limits. Stomach/Bowel: Stomach appears normal. The small bowel loops within the left hemiabdomen are dilated with air-fluid levels measuring up to 3.8 cm, image 63/3. Transition to decreased caliber distal small bowel noted within the left hemiabdomen, image 38/6. The colon is nondilated. Gas and stool noted up to the level of the rectum. Vascular/Lymphatic: Aortic atherosclerosis. No aneurysm. No abdominopelvic adenopathy. Chronic occlusion of the left common iliac artery. Reproductive: The complex cystic mass within the left paramidline pelvis measures 13.9 x 12.0 cm, image 70/3. Previously 13.4 by 11.5 cm. The complex cystic mass within the right abdomen measures 22.7 x 16.8 cm, image 51/3. Previously 21.4 by 16.9 cm. Other: Mild increase in moderate volume abdominopelvic ascites. Scattered peritoneal nodules are again seen. -Index nodule within the midline ventral abdomen measures 0.7 cm, image 32/3. Previously 0.6 cm. -Cystic peritoneal nodule within the ventral abdomen measures 2.7 x 2.3 cm, image 45/3. Previously this measured the same. Musculoskeletal: No acute or significant osseous findings. IMPRESSION: 1. The small bowel loops within the left hemiabdomen are dilated with air-fluid levels measuring up to 3.8 cm. Transition to decreased caliber distal small bowel noted within the left hemiabdomen. Findings are compatible with partial small bowel obstruction. 2. Mild increase in moderate volume abdominopelvic ascites. 3. Stable appearance of complex cystic masses  within the pelvis and right abdomen compatible with history of ovarian cancer. 4. Stable appearance of peritoneal nodules compatible with peritoneal carcinomatosis. 5. Cholelithiasis. 6. Chronic occlusion of the left common iliac artery. 7.  Aortic Atherosclerosis (ICD10-I70.0). Electronically Signed   By: Kimberley Penman M.D.   On: 06/22/2023 05:33   DG Abdomen Acute W/Chest Result Date: 06/22/2023 EXAM: XR Acute Abdomen Series with XR Chest, upright and supine views of the abdomen, frontal view of the chest. CLINICAL HISTORY: Vomiting. COMPARISON: CTA chest, CT abdomen/pelvis dated 12/20/2022. FINDINGS: LUNGS: Increased interstitial opacities with patchy bibasilar opacities, some of which may be chronic, but superimposed mild infection/pneumonia is not excluded. PLEURAL SPACES: No pleural effusion. No pneumothorax. HEART: The heart size is normal. MEDIASTINUM: Thoracic aortic atherosclerosis. PERITONEUM: No appreciable free air. BOWEL: Mildly dilated loops of small bowel in the left mid abdomen, nonspecific. Small bowel obstruction not occluded. BONES: No acute osseous abnormality. IMPRESSION: 1. Mildly dilated loops of small bowel in the left mid abdomen, nonspecific, small bowel obstruction not excluded. 2. Increased interstitial opacities with patchy bibasilar opacities, some of which may be chronic, but superimposed mild infection/pneumonia is not excluded. 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 GYN-ONC has been consulted as well, not sure if they would offer anything more aggressive. If not improving from an obstructive standpoint after NGT and paracentesis would recommend consideration of venting gastrostomy if patient and daughter would want that for symptom management. This would be purely palliative and not curative.  - general surgery will sign off at this time    I reviewed ED provider notes, hospitalist notes, last 24 h vitals and pain scores, last 48 h intake and output, last 24 h labs and trends, and last 24 h imaging results.  This care required high  level of medical decision making.   Annetta Killian, [facility] 06/22/2023, 2:20 PM Please see Amion for pager number during day hours 7:00am-4:30pm

## 2023-06-22 NOTE — Progress Notes (Signed)
 Kristen Colon 0[address] [facility] Hospitalized [facility] Patient Visit  Kristen Colon is a current hospice patient, with hospice diagnosis of malignant neoplasm of Left ovary. She was admitted 6.2.2025 with diagnosis of patrial small bowel obstruction. [facility] was notified by her daughter of calling EMS for patient due to vomiting. Per Dr. Tessie Colon, hospice physician, this is a related hospital admission.   Visited with patient and daughter who was at the bedside in the ED. Patient reports feeling some better. Discussed plan of care with daughter to include paracentesis, NG Tube, and ongoing evaluation/management. Also discussed disease progression and role in SBO, which is likely multifactorial.    Patient is GIP appropriate for evaluation and management of small bowel obstruction as well as paracentesis, IV fluids, antibiotics, pain and nausea medications.  Vital Signs: 98.2/91/13    124/74    O2 97% on 2LPM  I&O: not documented  Abnormal labs: KCL 3.3, Cre 1.24, Alb 3.0, GFRe 46, UA positive for HGB, leukocyte, protein, and mucus. Culture pending. Blood Cultures x 2 pending.   Diagnostics:  XR Acute Abdomen Series with XR Chest, upright and supine views of the abdomen, frontal view of the chest. IMPRESSION: 1. Mildly dilated loops of small bowel in the left mid abdomen, nonspecific, small bowel obstruction not excluded. 2. Increased interstitial opacities with patchy bibasilar opacities, some of which may be chronic, but superimposed mild infection/pneumonia is not excluded.  CT ABDOMEN AND PELVIS WITH CONTRAST  IMPRESSION: 1. The small bowel loops within the left hemiabdomen are dilated with air-fluid levels measuring up to 3.8 cm. Transition to decreased caliber distal small bowel noted within the left hemiabdomen. Findings are compatible with partial small bowel obstruction. 2. Mild increase in moderate volume abdominopelvic ascites. 3. Stable appearance of complex  cystic masses within the pelvis and right abdomen compatible with history of ovarian cancer. 4. Stable appearance of peritoneal nodules compatible with peritoneal carcinomatosis. 5. Cholelithiasis. 6. Chronic occlusion of the left common iliac artery. 7.  Aortic Atherosclerosis (ICD10-I70.0).  IV/PRN Meds: LR 1000ml x 1, LR at 24ml/hr, zosyn 3.375 IV x 1, Zofran  4mg  IV x 1, Morphine  1mg  IV x 1, SMOG enema x 1.    Assessment and Plan from note 6.67.29 73 year old with history of metastatic ovarian cancer, nave to chemotherapy (family choice) due to history of paranoid schizophrenia and cognitive impairment.  Lives at home with hospice.  She presents to the emergency room on account of abdominal distention, nausea and vomiting.  CT scan shows massive ascites and intestinal findings of partial small bowel obstruction.   #1.  Intractable nausea and vomiting secondary to partial small bowel obstruction.  Patient also has massive ascites.  CT scan also suggest some stool in the rectal vault.  Will volume replete with IV fluids.  Correct underlying electrolyte abnormalities including hypokalemia.  Trial of smog enema will be offered.  NG tube may be considered but patient paranoid at this time.  Will keep patient n.p.o. General Surgery input will be sought.  Will limit opiate use at this time.   #2.  Massive ascites likely secondary to metastatic ovarian cancer and peritoneal carcinomatosis: Previously been on letrozole  and tamoxifen in 2019 and 2021 respectively.  Patient has been receiving periodic abdominal paracentesis.  Will order for IR guided abdominal paracentesis for comfort.  Hold anticoagulation in anticipation for procedure.  Anticoagulation may be resumed post procedure. There has been prior discussion about having her obtain a Pleurx due to recurrent ascites requiring paracentesis.    #  3.  Acute kidney injury most likely due to dehydration.  Dehydration caused by intractable nausea and  vomiting.  Patient was volume repleted with "Ringer's"  lactate. Will avoid nephrotoxic medication.  Follow-up BUN and creatinine in AM.   #4.  Complex cystic pelvic mass: Compatible with metastatic ovarian cancer.  Patient daughter wishes to discuss options regarding treatment with oncology.  Oncology has since been consulted. She has followed up with Dr Kristen Colon of Gyn-Oncology   #5.  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  Patient is on chronic anticoagulation with apixaban .  Will hold anticoagulation for now due to pending abdominal paracentesis procedure.   #6.  Hypokalemia: Likely due to poor oral intake.  Potassium replacement protocol in place.   #7.  History of paranoid schizophrenia, bipolar disorder and cognitive impairment: Patient is on Invega  injections monthly.  Next dose on 06/21.  IV lorazepam  for anxiety.    Discharge Planning: Ongoing  Family Contact: spoke with daughter, Kristen Colon at bedside  IDT: Updated  Goals of Care: DNR  Kristen Colon BSN, RN, [facility] hospital liaison [phone]

## 2023-06-22 NOTE — H&P (Addendum)
 History and Physical    Patient: Kristen Colon:[id number] DOB: 1950-01-21 DOA: 06/22/2023 DOS: the patient was seen and examined on 06/22/2023 PCP: Verlene Glimpse, NP  Patient coming from: Home  Chief Complaint:  Chief Complaint  Patient presents with   Emesis   HPI: Kristen Colon is a 73 y.o. female with medical history significant of paranoid schizophrenia, dementia, DVT on anticoagulation with apixaban , hypertension, metastatic ovarian cancer that was diagnosed in 2016.  Patient is on hospice at home.  She is cared for at home by her daughter.  She received periodic abdominal paracentesis.  She has been in her usual state of health daughter noted worsening abdominal distention over the past several days.  For the past 4 days, patient has been having intermittent episodes of nausea and vomiting.  Today she has been unable to keep any food down and has vomited all day.  Vomitus was described as brownish/black.  Despite treatment with promethazine , symptoms do persist hence daughter decided to bring patient to the emergency room to be further evaluated.  No associated complaints of abdominal pain fever or chills.  Per daughter, patient has not had any bowel movements over the past 4 days.  Patient is a poor historian hence most of the history was obtained from daughter.  In the ED, CT scan was obtained and did reveal evidence of partial small bowel obstruction.  There is also evidence of worsening ascites and ovarian mass.  Review of Systems: unable to review all systems due to the inability of the patient to answer questions. Past Medical History:  Diagnosis Date   Anxiety    Dementia (HCC)    DVT complicating pregnancy    2023   Dyspnea    Headache    Hypertension    Schizophrenia (HCC)    paranoid   Past Surgical History:  Procedure Laterality Date   DIAGNOSTIC LAPAROSCOPY     pelvic mass Dr. Pearly Bound 06-30-17   IR PARACENTESIS  03/03/2023   IR PARACENTESIS  04/10/2023    IR PARACENTESIS  05/28/2023   LAPAROSCOPY N/A 06/30/2017   Procedure: LAPAROSCOPY DIAGNOSTIC WITH BIOPSIES;  Surgeon: Alphonso Aschoff, MD;  Location: [facility];  Service: Gynecology;  Laterality: N/A;   Social History:  reports that she has been smoking cigarettes. She has a 5 pack-year smoking history. She has never used smokeless tobacco. She reports that she does not currently use alcohol. She reports that she does not currently use drugs after having used the following drugs: Marijuana.  Allergies  Allergen Reactions   5-Alpha Reductase Inhibitors Other (See Comments)    Unknown reaction    Family History  Problem Relation Age of Onset   Heart disease Mother    Diabetes Sister     Prior to Admission medications   Medication Sig Start Date End Date Taking? Authorizing Provider  acetaminophen  (TYLENOL ) 500 MG tablet Take 500 mg by mouth every 6 (six) hours as needed for mild pain (pain score 1-3) or moderate pain (pain score 4-6).   Yes [provider]  acyclovir (ZOVIRAX) 800 MG tablet Take 800 mg by mouth 5 (five) times daily. 02/26/23  Yes [provider]  albuterol  (ACCUNEB ) 0.63 MG/3ML nebulizer solution Take 1 ampule by nebulization every 6 (six) hours as needed for wheezing or shortness of breath. 04/21/23  Yes [provider]  amLODipine  (NORVASC ) 5 MG tablet Take 5 mg by mouth daily. 03/16/23  Yes [provider]  apixaban  (ELIQUIS ) 5 MG TABS  tablet Take 1 tablet (5 mg total) by mouth 2 (two) times daily. 11/11/22  Yes Ali, Amjad, PA-C  baclofen  (LIORESAL ) 10 MG tablet Take 10 mg by mouth 2 (two) times daily.   Yes [provider]  benztropine  (COGENTIN ) 0.5 MG tablet Take 0.5 mg by mouth at bedtime. 05/20/19  Yes [provider]  buPROPion  (WELLBUTRIN  XL) 150 MG 24 hr tablet Take 150 mg by mouth daily. 06/10/19  Yes [provider]  diazepam  (VALIUM ) 5 MG tablet Take 5 mg by mouth at bedtime.   Yes [provider]   fentaNYL  (DURAGESIC ) 75 MCG/HR Place 1 patch onto the skin every 3 (three) days.   Yes [provider]  lactulose  (CHRONULAC ) 10 GM/15ML solution Take 10 g by mouth daily as needed for moderate constipation or mild constipation. 06/10/23  Yes [provider]  lisinopril  (ZESTRIL ) 10 MG tablet Take 10 mg by mouth daily. 04/02/23  Yes [provider]  metoprolol  tartrate (LOPRESSOR ) 25 MG tablet Take 12.5 mg by mouth 2 (two) times daily. 06/10/23  Yes [provider]  mupirocin ointment (BACTROBAN) 2 % Apply 1 Application topically 3 (three) times daily. 05/14/23  Yes [provider]  sodium phosphate  (FLEET) ENEM SMARTSIG:1 Bottle(s) Rectally Once 06/21/23  Yes [provider]  glipiZIDE  (GLUCOTROL ) 5 MG tablet Take 5 mg by mouth 2 (two) times daily before a meal.    [provider]  HYDROcodone -acetaminophen  (NORCO/VICODIN) 5-325 MG tablet Take 1 tablet by mouth every 6 (six) hours as needed for moderate pain. Patient taking differently: Take 1 tablet by mouth every other day. 03/02/19   [name], MD  INVEGA  SUSTENNA 234 MG/1.5ML injection Inject 234 mg into the muscle every 30 (thirty) days. 10/14/21   [provider]  linaclotide  (LINZESS ) 290 MCG CAPS capsule Take 290 mcg by mouth daily before breakfast.    [provider]  lubiprostone  (AMITIZA ) 24 MCG capsule Take 24 mcg by mouth daily.    [provider]  memantine  (NAMENDA ) 5 MG tablet Take 1 tablet (5 mg total) by mouth 2 (two) times daily. 03/02/19   [name], MD  metoCLOPramide  (REGLAN ) 10 MG tablet Take 10 mg by mouth 3 (three) times daily. 09/24/21   [provider]  mirtazapine  (REMERON ) 15 MG tablet Take 15 mg by mouth at bedtime.    [provider]  Morphine  Sulfate (MORPHINE  CONCENTRATE) 10 mg / 0.5 ml concentrated solution Take 5 mg by mouth every 2 (two) hours as needed for moderate pain. 10/26/21   [provider]   omeprazole (PRILOSEC) 20 MG capsule Take 20 mg by mouth daily. 10/26/21   [provider]  ondansetron  (ZOFRAN ) 4 MG tablet Take 4 mg by mouth every 8 (eight) hours as needed for nausea or vomiting. 10/26/21   [provider]  polyethylene glycol powder (GLYCOLAX /MIRALAX ) 17 GM/SCOOP powder Take 0.5 Containers by mouth daily as needed for moderate constipation. 10/26/21   [provider]  promethazine  (PHENERGAN ) 25 MG suppository Place 25 mg rectally every 6 (six) hours as needed for nausea or vomiting. 10/26/21   [provider]  promethazine  (PHENERGAN ) 25 MG tablet Take 25 mg by mouth every 6 (six) hours as needed for nausea or vomiting. 10/27/21   [provider]  risperiDONE  (RISPERDAL ) 0.5 MG tablet Take 0.5 mg by mouth 2 (two) times daily as needed (bipolar). 10/07/21   [provider]  traMADol  (ULTRAM ) 50 MG tablet Take 50 mg by  mouth every 6 (six) hours. 10/07/21   [provider]  VENTOLIN  HFA 108 (90 Base) MCG/ACT inhaler Inhale 2 puffs into the lungs every 6 (six) hours as needed for wheezing or shortness of breath. 10/17/21   [provider]    Physical Exam: Vitals:   06/22/23 0351 06/22/23 0400 06/22/23 0415 06/22/23 0430  BP:  102/67 104/73 102/64  Pulse:  (!) 109 (!) 107 (!) 109  Resp:  16 16 14   Temp: 100.2 F (37.9 C)     TempSrc: Rectal     SpO2:  100% 100% 100%  Weight:      Height:       General: Patient is a frail appearing female.  She is paranoid but answers to questions albeit unrelated to questions asked. HEENT: Patient has nasal cannula in place.  She also has a mask in place and does not want to take it off Chest: Clinically clear bilateral breath sounds.  Diminished at the base of the lungs.  Poor respiratory effort Cardiovascular: Regular S1-S2 with no murmur. Abdomen: Significantly distended abdomen.  Massive ascites.  Bowel sounds could not be appreciated.  Spleen and liver are not  palpable. CNS: Shows no focal deficit. Skin: No evidence of any rashes. Data Reviewed: WBC 10, hemoglobin 12.4, hematocrit 38, platelet count 350, PT 16.6, INR 1.3, glucose 153, sodium 138, potassium 3.3, chloride 93, bicarb 30, BUN 18, creatinine 1.24, calcium 9.2 lipase 22.  AST 22, ALT 24, total bilirubin 0.7.  CT IMPRESSION: 1. The small bowel loops within the left hemiabdomen are dilated with air-fluid levels measuring up to 3.8 cm. Transition to decreased caliber distal small bowel noted within the left hemiabdomen. Findings are compatible with partial small bowel obstruction. 2. Mild increase in moderate volume abdominopelvic ascites. 3. Stable appearance of complex cystic masses within the pelvis and right abdomen compatible with history of ovarian cancer. 4. Stable appearance of peritoneal nodules compatible with peritoneal carcinomatosis. 5. Cholelithiasis. 6. Chronic occlusion of the left common iliac artery. 7.  Aortic Atherosclerosis (ICD10-I70.0).  Assessment and Plan: 73 year old with history of metastatic ovarian cancer, nave to chemotherapy (family choice) due to history of paranoid schizophrenia and cognitive impairment.  Lives at home with hospice.  She presents to the emergency room on account of abdominal distention, nausea and vomiting.  CT scan shows massive ascites and intestinal findings of partial small bowel obstruction.  #1.  Intractable nausea and vomiting secondary to partial small bowel obstruction.  Patient also has massive ascites.  CT scan also suggest some stool in the rectal vault.  Will volume replete with IV fluids.  Correct underlying electrolyte abnormalities including hypokalemia.  Trial of smog enema will be offered.  NG tube may be considered but patient paranoid at this time.  Will keep patient n.p.o. General Surgery input will be sought.  Will limit opiate use at this time.  #2.  Massive ascites likely secondary to metastatic ovarian cancer and  peritoneal carcinomatosis: Previously been on letrozole  and tamoxifen in 2019 and 2021 respectively.  Patient has been receiving periodic abdominal paracentesis.  Will order for IR guided abdominal paracentesis for comfort.  Hold anticoagulation in anticipation for procedure.  Anticoagulation may be resumed post procedure. There has been prior discussion about having her obtain a Pleurx due to recurrent ascites requiring paracentesis.   #3.  Acute kidney injury most likely due to dehydration.  Dehydration caused by intractable nausea and vomiting.  Patient was volume repleted with "Ringer's"  lactate. Will avoid nephrotoxic  medication.  Follow-up BUN and creatinine in AM.  #4.  Complex cystic pelvic mass: Compatible with metastatic ovarian cancer.  Patient daughter wishes to discuss options regarding treatment with oncology.  Oncology has since been consulted. She has followed up with Dr Dean Every of Gyn-Oncology  #5.  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  Patient is on chronic anticoagulation with apixaban .  Will hold anticoagulation for now due to pending abdominal paracentesis procedure.  #6.  Hypokalemia: Likely due to poor oral intake.  Potassium replacement protocol in place.  #7.  History of paranoid schizophrenia, bipolar disorder and cognitive impairment: Patient is on Invega  injections monthly.  Next dose on 06/21.  IV lorazepam  for anxiety.  #8.  Hypertension: Will hold home regimen for now due to n.p.o. status.  IV hydralazine  as needed . #9.  DVT prophylaxis with SCDs and heparin.  Patient will be resumed on apixaban  postprocedure.   Advance Care Planning:   Code Status: Do not attempt resuscitation (DNR) PRE-ARREST INTERVENTIONS DESIRED   Consults: General surgery and GYN oncology  Family Communication: Daughter at bedside.  She was updated regarding plan of care  Severity of Illness: The appropriate patient status for this patient is INPATIENT.  Inpatient status is judged to be reasonable and necessary in order to provide the required intensity of service to ensure the patient's safety. The patient's presenting symptoms, physical exam findings, and initial radiographic and laboratory data in the context of their chronic comorbidities is felt to place them at high risk for further clinical deterioration. Furthermore, it is not anticipated that the patient will be medically stable for discharge from the hospital within 2 midnights of admission.   * I certify that at the point of admission it is my clinical judgment that the patient will require inpatient hospital care spanning beyond 2 midnights from the point of admission due to high intensity of service, high risk for further deterioration and high frequency of surveillance required.*  Author: Theodora Fish, MD 06/22/2023 6:21 AM  For on call review [email or web address].

## 2023-06-22 NOTE — ED Notes (Addendum)
 Pt was administered 2L O2 via nasal cannula d/t O2 sat from 89-90%.

## 2023-06-22 NOTE — ED Notes (Signed)
 Enema administered.  Pt had some immediate results of 3 hard stool balls, cleaned pt up.

## 2023-06-23 ENCOUNTER — Inpatient Hospital Stay (HOSPITAL_COMMUNITY)

## 2023-06-23 DIAGNOSIS — C569 Malignant neoplasm of unspecified ovary: Secondary | ICD-10-CM

## 2023-06-23 DIAGNOSIS — K56600 Partial intestinal obstruction, unspecified as to cause: Secondary | ICD-10-CM | POA: Diagnosis not present

## 2023-06-23 LAB — COMPREHENSIVE METABOLIC PANEL WITH GFR
ALT: 15 U/L (ref 0–44)
AST: 16 U/L (ref 15–41)
Albumin: 2.3 g/dL — ABNORMAL LOW (ref 3.5–5.0)
Alkaline Phosphatase: 51 U/L (ref 38–126)
Anion gap: 8 (ref 5–15)
BUN: 11 mg/dL (ref 8–23)
CO2: 28 mmol/L (ref 22–32)
Calcium: 8 mg/dL — ABNORMAL LOW (ref 8.9–10.3)
Chloride: 105 mmol/L (ref 98–111)
Creatinine, Ser: 0.84 mg/dL (ref 0.44–1.00)
GFR, Estimated: >60 mL/min (ref >60)
Glucose, Bld: 60 mg/dL — ABNORMAL LOW (ref 70–99)
Potassium: 3.2 mmol/L — ABNORMAL LOW (ref 3.5–5.1)
Sodium: 141 mmol/L (ref 135–145)
Total Bilirubin: 0.7 mg/dL (ref 0.0–1.2)
Total Protein: 5.3 g/dL — ABNORMAL LOW (ref 6.5–8.1)

## 2023-06-23 LAB — CBC WITH DIFFERENTIAL/PLATELET
Abs Immature Granulocytes: 0.01 10*3/uL (ref 0.00–0.07)
Basophils Absolute: 0 10*3/uL (ref 0.0–0.1)
Basophils Relative: 0 %
Eosinophils Absolute: 0.4 10*3/uL (ref 0.0–0.5)
Eosinophils Relative: 7 %
HCT: 33.7 % — ABNORMAL LOW (ref 36.0–46.0)
Hemoglobin: 10.8 g/dL — ABNORMAL LOW (ref 12.0–15.0)
Immature Granulocytes: 0 %
Lymphocytes Relative: 30 %
Lymphs Abs: 2 10*3/uL (ref 0.7–4.0)
MCH: 29 pg (ref 26.0–34.0)
MCHC: 32 g/dL (ref 30.0–36.0)
MCV: 90.3 fL (ref 80.0–100.0)
Monocytes Absolute: 0.5 10*3/uL (ref 0.1–1.0)
Monocytes Relative: 8 %
Neutro Abs: 3.6 10*3/uL (ref 1.7–7.7)
Neutrophils Relative %: 55 %
Platelets: 284 10*3/uL (ref 150–400)
RBC: 3.73 MIL/uL — ABNORMAL LOW (ref 3.87–5.11)
RDW: 15.4 % (ref 11.5–15.5)
WBC: 6.5 10*3/uL (ref 4.0–10.5)
nRBC: 0 % (ref 0.0–0.2)

## 2023-06-23 LAB — APTT: aPTT: 77 s — ABNORMAL HIGH (ref 24–36)

## 2023-06-23 LAB — URINE CULTURE: Culture: <10000 — AB

## 2023-06-23 LAB — HEMOGLOBIN A1C
Hgb A1c MFr Bld: 5.7 % — ABNORMAL HIGH (ref 4.8–5.6)
Mean Plasma Glucose: 116.89 mg/dL

## 2023-06-23 LAB — MAGNESIUM: Magnesium: 2.1 mg/dL (ref 1.7–2.4)

## 2023-06-23 LAB — GLUCOSE, CAPILLARY
Glucose-Capillary: 68 mg/dL — ABNORMAL LOW (ref 70–99)
Glucose-Capillary: 38 mg/dL — CL (ref 70–99)
Glucose-Capillary: 147 mg/dL — ABNORMAL HIGH (ref 70–99)

## 2023-06-23 LAB — PHOSPHORUS: Phosphorus: 3.4 mg/dL (ref 2.5–4.6)

## 2023-06-23 LAB — HEPARIN LEVEL (UNFRACTIONATED): Heparin Unfractionated: 0.56 [IU]/mL (ref 0.30–0.70)

## 2023-06-23 MED ORDER — LACTATED RINGERS IV SOLN: INTRAVENOUS | Status: DC

## 2023-06-23 MED ORDER — HEPARIN (PORCINE) 25000 UT/250ML-% IV SOLN
850.0000 [IU]/h | INTRAVENOUS | Status: DC
  Administered 2023-06-23: 850 [IU]/h via INTRAVENOUS
  Filled 2023-06-23: qty 250

## 2023-06-23 MED ORDER — KCL-LACTATED RINGERS-D5W 20 MEQ/L IV SOLN
INTRAVENOUS | Status: AC
  Filled 2023-06-23: qty 1000

## 2023-06-23 MED ORDER — DIATRIZOATE MEGLUMINE & SODIUM 66-10 % PO SOLN
90.0000 mL | Freq: Once | ORAL | Status: AC
  Administered 2023-06-23: 90 mL via NASOGASTRIC
  Filled 2023-06-23: qty 90

## 2023-06-23 MED ORDER — DEXTROSE 50 % IV SOLN
INTRAVENOUS | Status: AC
  Administered 2023-06-23: 50 mL
  Filled 2023-06-23: qty 50

## 2023-06-23 MED ORDER — INSULIN ASPART 100 UNIT/ML IJ SOLN
0.0000 [IU] | INTRAMUSCULAR | Status: DC
  Administered 2023-06-24 (×2): 1 [IU] via SUBCUTANEOUS
  Administered 2023-06-25 (×2): 2 [IU] via SUBCUTANEOUS
  Administered 2023-06-25: 1 [IU] via SUBCUTANEOUS
  Administered 2023-06-25: 2 [IU] via SUBCUTANEOUS
  Administered 2023-06-26: 1 [IU] via SUBCUTANEOUS

## 2023-06-23 MED ORDER — DEXTROSE 50 % IV SOLN: 25.0000 g | INTRAVENOUS | Status: DC | PRN

## 2023-06-23 MED ORDER — DEXTROSE 50 % IV SOLN: 25.0000 g | INTRAVENOUS | Status: AC

## 2023-06-23 MED ORDER — POTASSIUM CHLORIDE 10 MEQ/100ML IV SOLN
10.0000 meq | INTRAVENOUS | Status: AC
  Administered 2023-06-23 (×4): 10 meq via INTRAVENOUS
  Filled 2023-06-23 (×4): qty 100

## 2023-06-23 NOTE — Plan of Care (Signed)

## 2023-06-23 NOTE — Progress Notes (Signed)
 PHARMACY - ANTICOAGULATION CONSULT NOTE  Pharmacy Consult for hx of heparin  Indication: hx of DVT, on Eliquis  PTA, SBO unable to take PO   Allergies  Allergen Reactions   Pork-Derived Products     Patient Measurements: Height: 5\' 2"  (157.5 cm) Weight: 62.6 kg (138 lb 0.1 oz) IBW/kg (Calculated) : 50.1 HEPARIN DW (KG): 62.6  Vital Signs: Temp: 98 F (36.7 C) (06/03 1720) BP: 129/67 (06/03 1720) Pulse Rate: 89 (06/03 1720)  Labs: Recent Labs    06/22/23 0305 06/22/23 0418 06/23/23 0600 06/23/23 2156  HGB 12.4  --  10.8*  --   HCT 38.5  --  33.7*  --   PLT 350  --  284  --   APTT  --   --   --  77*  LABPROT  --  16.6*  --   --   INR  --  1.3*  --   --   HEPARINUNFRC  --   --   --  0.56  CREATININE 1.24*  --  0.84  --     Estimated Creatinine Clearance: 51.9 mL/min (by C-G formula based on SCr of 0.84 mg/dL).   Medical History: Past Medical History:  Diagnosis Date   Anxiety    Dementia (HCC)    DVT complicating pregnancy    2023   Dyspnea    Headache    Hypertension    Schizophrenia (HCC)    paranoid    Assessment: Patient admitted with CC of abdominal distension, gets paracentesis outpatient for hx of ovarian cancer. Found to have SBO, per general surgery will be non-op management. On Eliquis  PTA for hx of DVT, last dose within the past week. Admitted on 6/2, Eliquis  held for paracentesis.   HgB 10.8 and PLTs 284. Pharmacy consulted to dose heparin.   6/4 PM update: HL 0.56 aPTT 77 seconds No signs of bleeding / issues w/ gtt  Goal of Therapy:  Heparin level 0.3-0.7 units/ml aPTT 66-102 Monitor platelets by anticoagulation protocol: Yes   Plan:  Continue heparin infusion at 850 units/hr Check confirmatory heparin level in 8 hours and daily while on heparin DC aPTT. Correlates w/ HL Continue to monitor H&H and platelets F/u SBO resolution to transition back to Eliquis .  Noted allergy to heparin, patient has been getting subcutaneous heparin  while admitted with no issues, will d/c.   Marleta Simmer BS, PharmD, BCPS Clinical Pharmacist 06/23/2023 10:22 PM  Contact: [phone] after 3 PM  "Be curious, not judgmental..." -Rumalda Counter

## 2023-06-23 NOTE — Plan of Care (Signed)
  Problem: Education: Goal: Knowledge of General Education information will improve Description: Including pain rating scale, medication(s)/side effects and non-pharmacologic comfort measures Outcome: Progressing   Problem: Clinical Measurements: Goal: Ability to maintain clinical measurements within normal limits will improve Outcome: Progressing Goal: Will remain free from infection Outcome: Progressing Goal: Diagnostic test results will improve Outcome: Progressing Goal: Respiratory complications will improve Outcome: Progressing Goal: Cardiovascular complication will be avoided Outcome: Progressing   Problem: Activity: Goal: Risk for activity intolerance will decrease Outcome: Progressing   Problem: Coping: Goal: Level of anxiety will decrease Outcome: Progressing   Problem: Elimination: Goal: Will not experience complications related to bowel motility Outcome: Progressing Goal: Will not experience complications related to urinary retention Outcome: Progressing   Problem: Pain Managment: Goal: General experience of comfort will improve and/or be controlled Outcome: Progressing   Problem: Safety: Goal: Ability to remain free from injury will improve Outcome: Progressing   Problem: Skin Integrity: Goal: Risk for impaired skin integrity will decrease Outcome: Progressing

## 2023-06-23 NOTE — Inpatient Diabetes Management (Signed)
 Inpatient Diabetes Program Recommendations  AACE/ADA: New Consensus Statement on Inpatient Glycemic Control (2015)  Target Ranges:  Prepandial:   less than 140 mg/dL      Peak postprandial:   less than 180 mg/dL (1-2 hours)      Critically ill patients:  140 - 180 mg/dL   Lab Results  Component Value Date   GLUCAP 261 (H) 03/03/2019   HGBA1C 8.5 (H) 02/21/2019    Review of Glycemic Control  Latest Reference Range & Units 06/22/23 03:05 06/23/23 06:00  Glucose 70 - 99 mg/dL 132 (H) 60 (L)   Diabetes history: DM 2 Outpatient Diabetes medications: Glipizide  5 mg bid Current orders for Inpatient glycemic control:  None  Inpatient Diabetes Program Recommendations:    Note: hypoglycemia in labs at 60 this am  -   Check CBGs and order Novolog  0-6 unit Q4 hours while NPO  Thanks,  Eloise Hake RN, MSN, BC-ADM Inpatient Diabetes Coordinator Team Pager [phone] (8a-5p)

## 2023-06-23 NOTE — Progress Notes (Signed)
 PROGRESS NOTE    Kristen Colon  ZOX:[id number] DOB: January 30, 1950 DOA: 06/22/2023 PCP: Verlene Glimpse, NP   Brief Narrative:  The patient is a chronically ill-appearing 73 year old female with a past medical history significant for paranoid schizophrenia, dementia, history of DVT on anticoagulant with apixaban , hypertension, history of metastatic ovarian cancer as diagnosed in 2016 who is currently on home hospice.  She was brought to the hospital given intermittent episodes of nausea vomiting with no bowel movement in about 6 days.    Subsequently she is found to have a bowel obstruction and General Surgery was consulted.  IR was consulted for paracentesis.  NG tube was placed for her partial small bowel obstruction and connected to low intermittent suction.  Per general surgery she is not a surgical candidate and most would recommend IR placement of venting G-tube if she does not improve.    Continues to have output and had 300 mL out of her NG in the canister this morning but abdomen remains distended and she is asking for diet. KUB showed a non-obstructive bowel gas pattern so will initiate small bowel protocol to see if NG can be removed. GYN oncology evaluation to see if they have any other recommendations and they will see the patient in the AM.   Assessment and Plan:  Intractable nausea and vomiting secondary to partial small bowel obstruction.  Patient also has massive ascites.  CT scan also suggest some stool in the rectal vault.  Will volume replete with IV fluids.  Correct underlying electrolyte abnormalities including hypokalemia.  Trial of smog enema will be offered.  NGT Suction. Repeat KUB in the AM. Surgery Consulted for further evaluation. Will have patient undergo SBO protocol. If fails conservative measures is not a candidate for surgical intervention and may likely need a Palliative venting g tube based on GOC Discussions.    Massive ascites likely secondary to metastatic  ovarian cancer and peritoneal carcinomatosis: Previously been on letrozole  and tamoxifen in 2019 and 2021 respectively.  Patient has been receiving periodic abdominal paracentesis.  S/p IR guided abdominal paracentesis for comfort.  Hold anticoagulation in anticipation for procedure.  Anticoagulation w/ Heparin gtt post-procedure. There has been prior discussion about having her obtain a Pleurx due to recurrent ascites requiring paracentesis but will defer to IR   Acute kidney injury most likely due to dehydration: Dehydration caused by intractable nausea and vomiting.  Patient was volume repleted with "Ringer's"  lactate at 60 mL/hr x1 day but will renew for another 12 hours @ 75 mL/hr. BUN/Cr went from 18/1.24 -> 11/0.84. Avoid Nephrotoxic Medications, Contrast Dyes, Hypotension and Dehydration to Ensure Adequate Renal Perfusion and will need to Renally Adjust Meds. CTM and Trend Renal Function carefully and repeat CMP in the AM    Complex cystic pelvic mass: Compatible with metastatic ovarian cancer. Patient daughter wishes to discuss options regarding treatment with oncology. 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  Patient is on chronic anticoagulation with apixaban .  Will hold anticoagulation for now due to pending abdominal paracentesis procedure and because of pSBO. Start Heparin gtt after Paracentesis and continue until SBO improves and tolerating diet  Normocytic Anemia/Anemia of Malignancy: Hgb/Hct is now 10.8/33.7. Check Anemia Panel in the AM. CTM for S/Sx of Bleeding as patient is on Heparin gtt for [facility]. No overt bleeding noted. Repeat CBC in the AM    Hypokalemia: Likely  due to poor oral intake. K+ is 3.2. Replete w/ IV KCL 40 mEQ x1. CTM and Replete as Necessary. Repeat CMP in the AM   History of paranoid schizophrenia, bipolar disorder and cognitive impairment: Patient is on Invega   injections monthly.  Next dose on 06/21.  IV lorazepam  for anxiety.   Essential Hypertension: Will hold home regimen for now due to n.p.o. status.  IV hydralazine  as needed. CTM BP per protocol.   Hypoalbuminemia: Patient's Albumin  Level went from 3.0 -> 2.3. CTM and Trend and repeat CMP in the AM  DVT prophylaxis: SCDs Start: 06/22/23 0610    Code Status: Do not attempt resuscitation (DNR) PRE-ARREST INTERVENTIONS DESIRED Family Communication: No family present @ bedside and I called to update the Daughter [name] and she did not pick up the telephone  Disposition Plan:  Level of care: Med-Surg Status is: Inpatient Remains inpatient appropriate because: Needs further clinical improvement and clearance by the specialist   Consultants:  GYN-Onc General Surgery [facility]  Procedures:  As delineated as above  Antimicrobials:  Anti-infectives (From admission, onward)    Start     Dose/Rate Route Frequency Ordered Stop   06/22/23 0430  piperacillin-tazobactam (ZOSYN) IVPB 3.375 g        3.375 g 100 mL/hr over 30 Minutes Intravenous  Once 06/22/23 0417 06/22/23 0554       Subjective: Seen and examined at bedside and states that she is hungry.  Pleasantly confused.  No family at bedside.  Denies any abdominal pain but abdomen is extremely distended.  Has about 300 out in her canister.  No other concerns or complaints at this time.  Objective: Vitals:   06/22/23 2054 06/23/23 0035 06/23/23 0435 06/23/23 1720  BP: 121/72 (!) 110/53 112/60 129/67  Pulse: 94 84 75 89  Resp: 18 18 18    Temp: 98.8 F (37.1 C) 98.6 F (37 C) 98.7 F (37.1 C) 98 F (36.7 C)  TempSrc: Oral Oral Oral   SpO2: 98% 97% 100% (!) 89%  Weight:      Height:        Intake/Output Summary (Last 24 hours) at 06/23/2023 1853 Last data filed at 06/23/2023 1548 Gross per 24 hour  Intake 204.51 ml  Output 500 ml  Net -295.49 ml   Filed Weights   06/22/23 0258  Weight: 62.6 kg    Examination: Physical Exam:  Constitutional: Elderly chronically ill-appearing African-American female who appears calm and more comfortable today compared to yesterday Respiratory: Diminished to auscultation bilaterally, no wheezing, rales, rhonchi or crackles. Normal respiratory effort and patient is not tachypenic. No accessory muscle use.  Unlabored breathing Cardiovascular: RRR, no murmurs / rubs / gallops. S1 and S2 auscultated.  Has some lower extremity edema Abdomen: Soft, slightly-tender, severely distended secondary to body habitus and ascites.  Bowel sounds are somewhat diminished.  GU: Deferred. Musculoskeletal: No clubbing / cyanosis of digits/nails. No joint deformity upper and lower extremities. Skin: No rashes, lesions, ulcers on limited skin evaluation. No induration; Warm and dry.  Neurologic: CN 2-12 grossly intact with no focal deficits. Romberg sign and cerebellar reflexes not assessed.  Psychiatric: She is confused but she is awake and fully oriented  Data Reviewed: I have personally reviewed following labs and imaging studies  CBC: Recent Labs  Lab 06/22/23 0305 06/23/23 0600  WBC 10.0 6.5  NEUTROABS  --  3.6  HGB 12.4 10.8*  HCT 38.5 33.7*  MCV 88.3 90.3  PLT 350 284   Basic Metabolic Panel:  Recent Labs  Lab 06/22/23 0305 06/23/23 0600  NA 138 141  K 3.3* 3.2*  CL 93* 105  CO2 30 28  GLUCOSE 153* 60*  BUN 18 11  CREATININE 1.24* 0.84  CALCIUM 9.2 8.0*  MG  --  2.1  PHOS  --  3.4   GFR: Estimated Creatinine Clearance: 51.9 mL/min (by C-G formula based on SCr of 0.84 mg/dL). Liver Function Tests: Recent Labs  Lab 06/22/23 0305 06/23/23 0600  AST 24 16  ALT 18 15  ALKPHOS 68 51  BILITOT 0.7 0.7  PROT 6.7 5.3*  ALBUMIN  3.0* 2.3*   Recent Labs  Lab 06/22/23 0305  LIPASE 22   No results for input(s): "AMMONIA" in the last 168 hours. Coagulation Profile: Recent Labs  Lab 06/22/23 0418  INR 1.3*   Cardiac Enzymes: No results  for input(s): "CKTOTAL", "CKMB", "CKMBINDEX", "TROPONINI" in the last 168 hours. BNP (last 3 results) No results for input(s): "PROBNP" in the last 8760 hours. HbA1C: No results for input(s): "HGBA1C" in the last 72 hours. CBG: Recent Labs  Lab 06/23/23 1033  GLUCAP 68*   Lipid Profile: No results for input(s): "CHOL", "HDL", "LDLCALC", "TRIG", "CHOLHDL", "LDLDIRECT" in the last 72 hours. Thyroid  Function Tests: No results for input(s): "TSH", "T4TOTAL", "FREET4", "T3FREE", "THYROIDAB" in the last 72 hours. Anemia Panel: No results for input(s): "VITAMINB12", "FOLATE", "FERRITIN", "TIBC", "IRON", "RETICCTPCT" in the last 72 hours. Sepsis Labs: Recent Labs  Lab 06/22/23 0427  LATICACIDVEN 1.3   Recent Results (from the past 240 hours)  Blood Culture (routine x 2)     Status: None (Preliminary result)   Collection Time: 06/22/23  4:18 AM   Specimen: BLOOD  Result Value Ref Range Status   Specimen Description BLOOD RIGHT ANTECUBITAL  Final   Special Requests   Final    BOTTLES DRAWN AEROBIC AND ANAEROBIC Blood Culture results may not be optimal due to an inadequate volume of blood received in culture bottles   Culture   Final    NO GROWTH 1 DAY Performed at [facility], [address] [place], [place] [address]    Report Status PENDING  Incomplete  Blood Culture (routine x 2)     Status: None (Preliminary result)   Collection Time: 06/22/23  4:18 AM   Specimen: BLOOD  Result Value Ref Range Status   Specimen Description BLOOD LEFT ANTECUBITAL  Final   Special Requests   Final    BOTTLES DRAWN AEROBIC AND ANAEROBIC Blood Culture results may not be optimal due to an inadequate volume of blood received in culture bottles   Culture   Final    NO GROWTH 1 DAY Performed at [facility], [address], [place], [place] [address]    Report Status PENDING  Incomplete  Urine Culture     Status: Abnormal   Collection Time: 06/22/23  7:29 AM   Specimen: Urine, Random   Result Value Ref Range Status   Specimen Description URINE, RANDOM  Final   Special Requests NONE Reflexed from [id number]  Final   Culture (A)  Final    <10,000 COLONIES/mL INSIGNIFICANT GROWTH Performed at [facility], [address] [place], [place] [address]    Report Status 06/23/2023 FINAL  Final  Anaerobic culture w Gram Stain     Status: None (Preliminary result)   Collection Time: 06/22/23  4:26 PM   Specimen: Abdomen  Result Value Ref Range Status   Specimen Description ABDOMEN PERITONEAL  Final   Special Requests NONE  Final   Gram Stain   Final    NO WBC SEEN NO ORGANISMS SEEN Performed at [facility], [address], [place], [place] [address]    Culture PENDING  Incomplete   Report Status PENDING  Incomplete  Body fluid culture w Gram Stain     Status: None (Preliminary result)   Collection Time: 06/22/23  4:36 PM   Specimen: Abdomen; Peritoneal Fluid  Result Value Ref Range Status   Specimen Description ABDOMEN PLEURAL  Final   Special Requests NONE  Final   Gram Stain NO WBC SEEN NO ORGANISMS SEEN   Final   Culture   Final    NO GROWTH < 24 HOURS Performed at [facility], [address] [place], [place] [address]    Report Status PENDING  Incomplete    Radiology Studies: DG Abd 1 View Result Date: 06/23/2023 CLINICAL DATA:  Small-bowel obstruction EXAM: ABDOMEN - 1 VIEW COMPARISON:  Abdominal radiograph dated 06/22/2023 FINDINGS: Partially imaged enteric tube tip projects over the left upper quadrant. Nonobstructive bowel gas pattern. Large soft tissue density within the right hemiabdomen displaces bowel loops into the left hemiabdomen. No free air or pneumatosis. Excreted contrast material within the urinary bladder. No acute or substantial osseous abnormality. The sacrum and coccyx are partially obscured by overlying bowel contents. Partially imaged lung bases are clear. IMPRESSION: 1. Nonobstructive bowel gas pattern. 2. Large soft tissue  density within the right hemiabdomen displaces bowel loops into the left hemiabdomen, better evaluated on prior CT. Electronically Signed   By: Limin  Xu M.D.   On: 06/23/2023 11:51   IR Paracentesis Result Date: 06/23/2023 INDICATION: 73 year old female with history of ovarian cancer and recurrent malignant ascites. Request for therapeutic and diagnostic paracentesis. EXAM: ULTRASOUND GUIDED  PARACENTESIS MEDICATIONS: 7 mL 1% lidocaine  COMPLICATIONS: None immediate. PROCEDURE: Informed written consent was obtained from the patient's daughter after a discussion of the risks, benefits and alternatives to treatment. A timeout was performed prior to the initiation of the procedure. Initial ultrasound scanning demonstrates a small amount of ascites within the left lower abdominal quadrant. The left lower abdomen was prepped and draped in the usual sterile fashion. 1% lidocaine  was used for local anesthesia. Following this, a 19 gauge, 7-cm, Yueh catheter was introduced. An ultrasound image was saved for documentation purposes. The paracentesis was performed. The catheter was removed and a dressing was applied. The patient tolerated the procedure well without immediate post procedural complication. FINDINGS: A total of approximately 3.3 L of hazy amber fluid was removed. Samples were sent to the laboratory as requested by the clinical team. IMPRESSION: Successful ultrasound-guided paracentesis yielding 3.3 liters of peritoneal fluid. Performed by: Aimee Han, PA-C Electronically Signed   By: Nicoletta Barrier M.D.   On: 06/23/2023 07:34   DG Abd 1 View Result Date: 06/22/2023 CLINICAL DATA:  NG tube placement. EXAM: ABDOMEN - 1 VIEW COMPARISON:  CT earlier today FINDINGS: Tip and side port of the enteric tube below the diaphragm in the stomach. Air-filled loops of bowel in the upper abdomen. IMPRESSION: Tip and side port of the enteric tube below the diaphragm in the stomach. Electronically Signed   By: Chadwick Colonel M.D.    On: 06/22/2023 19:47   CT ABDOMEN PELVIS W CONTRAST Result Date: 06/22/2023 CLINICAL DATA:  Evaluate for bowel obstruction. History of ovarian cancer. Complains of abdominal pain and distension. * Tracking Code: BO * EXAM: CT ABDOMEN AND  PELVIS WITH CONTRAST TECHNIQUE: Multidetector CT imaging of the abdomen and pelvis was performed using the standard protocol following bolus administration of intravenous contrast. RADIATION DOSE REDUCTION: This exam was performed according to the departmental dose-optimization program which includes automated exposure control, adjustment of the mA and/or kV according to patient size and/or use of iterative reconstruction technique. CONTRAST:  75mL OMNIPAQUE  IOHEXOL  350 MG/ML SOLN COMPARISON:  12/20/2022. FINDINGS: Lower chest: No pleural effusion or airspace consolidation. Hepatobiliary: Previous hemangioma within the lateral segment of left lobe of liver measures 0.9 cm, unchanged from previous exam. Small low-density structure within the inferior right lobe of liver measures 7 mm, image 28/3. Also unchanged. The gallbladder appears decompressed containing multiple stones measuring up to 5 mm, image 23/3. Common bile duct measures up to 6 mm. Pancreas: Unremarkable. No pancreatic ductal dilatation or surrounding inflammatory changes. Spleen: Normal in size without focal abnormality. Adrenals/Urinary Tract: Normal appearance of the adrenal glands. No nephrolithiasis, hydronephrosis or suspicious mass. Urinary bladder appears within normal limits. Stomach/Bowel: Stomach appears normal. The small bowel loops within the left hemiabdomen are dilated with air-fluid levels measuring up to 3.8 cm, image 63/3. Transition to decreased caliber distal small bowel noted within the left hemiabdomen, image 38/6. The colon is nondilated. Gas and stool noted up to the level of the rectum. Vascular/Lymphatic: Aortic atherosclerosis. No aneurysm. No abdominopelvic adenopathy. Chronic occlusion  of the left common iliac artery. Reproductive: The complex cystic mass within the left paramidline pelvis measures 13.9 x 12.0 cm, image 70/3. Previously 13.4 by 11.5 cm. The complex cystic mass within the right abdomen measures 22.7 x 16.8 cm, image 51/3. Previously 21.4 by 16.9 cm. Other: Mild increase in moderate volume abdominopelvic ascites. Scattered peritoneal nodules are again seen. -Index nodule within the midline ventral abdomen measures 0.7 cm, image 32/3. Previously 0.6 cm. -Cystic peritoneal nodule within the ventral abdomen measures 2.7 x 2.3 cm, image 45/3. Previously this measured the same. Musculoskeletal: No acute or significant osseous findings. IMPRESSION: 1. The small bowel loops within the left hemiabdomen are dilated with air-fluid levels measuring up to 3.8 cm. Transition to decreased caliber distal small bowel noted within the left hemiabdomen. Findings are compatible with partial small bowel obstruction. 2. Mild increase in moderate volume abdominopelvic ascites. 3. Stable appearance of complex cystic masses within the pelvis and right abdomen compatible with history of ovarian cancer. 4. Stable appearance of peritoneal nodules compatible with peritoneal carcinomatosis. 5. Cholelithiasis. 6. Chronic occlusion of the left common iliac artery. 7.  Aortic Atherosclerosis (ICD10-I70.0). Electronically Signed   By: Kimberley Penman M.D.   On: 06/22/2023 05:33   DG Abdomen Acute W/Chest Result Date: 06/22/2023 EXAM: XR Acute Abdomen Series with XR Chest, upright and supine views of the abdomen, frontal view of the chest. CLINICAL HISTORY: Vomiting. COMPARISON: CTA chest, CT abdomen/pelvis dated 12/20/2022. FINDINGS: LUNGS: Increased interstitial opacities with patchy bibasilar opacities, some of which may be chronic, but superimposed mild infection/pneumonia is not excluded. PLEURAL SPACES: No pleural effusion. No pneumothorax. HEART: The heart size is normal. MEDIASTINUM: Thoracic aortic  atherosclerosis. PERITONEUM: No appreciable free air. BOWEL: Mildly dilated loops of small bowel in the left mid abdomen, nonspecific. Small bowel obstruction not occluded. BONES: No acute osseous abnormality. IMPRESSION: 1. Mildly dilated loops of small bowel in the left mid abdomen, nonspecific, small bowel obstruction not excluded. 2. Increased interstitial opacities with patchy bibasilar opacities, some of which may be chronic, but superimposed mild infection/pneumonia is not excluded. Electronically signed by: Zadie Herter MD  06/22/2023 03:59 AM EDT RP Workstation: [id number]   Scheduled Meds:  amLODipine   5 mg Oral Daily   benztropine   0.5 mg Oral QHS   buPROPion   150 mg Oral Daily   diatrizoate meglumine-sodium  90 mL Per NG tube Once   diazepam   5 mg Oral QHS   fentaNYL   1 patch Transdermal Q72H   linaclotide   290 mcg Oral QAC breakfast   lisinopril   10 mg Oral Daily   memantine   5 mg Oral BID   metoprolol  tartrate  12.5 mg Oral BID   mirtazapine   15 mg Oral QHS   pantoprazole  (PROTONIX ) IV  40 mg Intravenous Q24H   sodium chloride  flush  3 mL Intravenous Q12H   Continuous Infusions:  heparin 850 Units/hr (06/23/23 1624)   lactated ringers       LOS: 1 day   Aura Leeds, DO [facility] Available via Epic secure chat 7am-7pm After these hours, please refer to coverage provider listed on [email or web address] 06/23/2023, 6:53 PM

## 2023-06-23 NOTE — Progress Notes (Signed)
 Hypoglycemic Event  CBG: 38  Treatment: D50 50 mL (25 gm)  Symptoms: None  Follow-up CBG: Time:2059 CBG Result:147  Possible Reasons for Event: Inadequate meal intake  Comments/MD notified:Rath[place], MD    Kristen Colon

## 2023-06-23 NOTE — Progress Notes (Signed)
 PHARMACY - ANTICOAGULATION CONSULT NOTE  Pharmacy Consult for hx of heparin  Indication: hx of DVT, on Eliquis  PTA, SBO unable to take PO   Allergies  Allergen Reactions   Pork-Derived Products     Patient Measurements: Height: 5\' 2"  (157.5 cm) Weight: 62.6 kg (138 lb 0.1 oz) IBW/kg (Calculated) : 50.1 HEPARIN DW (KG): 62.6  Vital Signs: Temp: 98.7 F (37.1 C) (06/03 0435) Temp Source: Oral (06/03 0435) BP: 112/60 (06/03 0435) Pulse Rate: 75 (06/03 0435)  Labs: Recent Labs    06/22/23 0305 06/22/23 0418 06/23/23 0600  HGB 12.4  --  10.8*  HCT 38.5  --  33.7*  PLT 350  --  284  LABPROT  --  16.6*  --   INR  --  1.3*  --   CREATININE 1.24*  --  0.84    Estimated Creatinine Clearance: 51.9 mL/min (by C-G formula based on SCr of 0.84 mg/dL).   Medical History: Past Medical History:  Diagnosis Date   Anxiety    Dementia (HCC)    DVT complicating pregnancy    2023   Dyspnea    Headache    Hypertension    Schizophrenia (HCC)    paranoid    Assessment: Patient admitted with CC of abdominal distension, gets paracentesis outpatient for hx of ovarian cancer. Found to have SBO, per general surgery will be non-op management. On Eliquis  PTA for hx of DVT, last dose within the past week. Admitted on 6/2, Eliquis  held for paracentesis.   HgB 10.8 and PLTs 284. Pharmacy consulted to dose heparin.   Goal of Therapy:  Heparin level 0.3-0.7 units/ml aPTT 66-102 Monitor platelets by anticoagulation protocol: Yes   Plan:  No bolus per MD.  Start heparin infusion at 850 units/hr Check anti-Xa level in 8 hours and daily while on heparin Continue to monitor H&H and platelets F/u SBO resolution to transition back to Eliquis .  Noted allergy to heparin, patient has been getting subcutaneous heparin while admitted with no issues, will d/c.   Mamie Searles, PharmD, BCCCP  06/23/2023,2:02 PM

## 2023-06-23 NOTE — Progress Notes (Signed)
 [name] [facility] Patient Visit   Kristen Colon is a current hospice patient, with hospice diagnosis of malignant neoplasm of Left ovary. She was admitted 6.2.2025 with diagnosis of patrial small bowel obstruction. [facility] was notified by her daughter of calling EMS for patient due to vomiting. Per Dr. Tessie Fila, hospice physician, this is a related hospital admission.    Visited with patient who reports feeling some better and is feeling hungry. Denies having nausea. Discussed care with daughter this morning by phone.    Patient is GIP appropriate for evaluation and management of small bowel obstruction with Nasogastric tube to suction, as well as paracentesis, and IV medications for electrolyte replacement and pain.   Vital Signs: 98.7/75/18   112/60   O2 100% on 2L via [place]   I&O: 48/300 (noted likely not correct as patient was receiving IV fluids in excess of this number)   Abnormal labs:KCL 3.2, Ca 8.0, Alb 2.3, T. Pro. 5.3, Urine Culture pending, HGB 10.8. abd. fluid culture pending.  Diagnostics:  One view abd xray: Narrative & Impression   IMPRESSION: 1. Nonobstructive bowel gas pattern. 2. Large soft tissue density within the right hemiabdomen displaces bowel loops into the left hemiabdomen, better evaluated on prior CT.  IR Paracentesis:   IMPRESSION: Successful ultrasound-guided paracentesis yielding 3.3 liters of peritoneal fluid.  IV/PRN Meds: Valium  2.5 mg IV x 1, KCL 10 meq IV x 4, Morphine  1mg  IV x 1, LR at 60ml/hr (discontinued today).     Assessment and Plan from note 6.2.25  Despite promethazine  symptoms persisted so daughter brought her in for further evaluation and she is found to have a partial small bowel obstruction likely in the setting of her malignancy. There is also evidence of worsening ascites and ovarian mass so General Surgery and GYN-Onc were consulted. Interventional radiology was consulted for IR  paracentesis and she underwent this today. NG tube was placed for her partial small bowel obstruction and connected to low intermittent suction. Per general surgery she is not a surgical candidate and most would recommend IR placement of venting G-tube if she does not improve. The paracentesis has been sent off for fluid analysis and this is pending.   Discharge Planning: Ongoing   Family Contact: spoke with daughter, [name] by phone    IDT: Updated   Goals of Care: DNR   [name] BSN, RN, [facility] hospital liaison [phone]

## 2023-06-24 ENCOUNTER — Inpatient Hospital Stay (HOSPITAL_COMMUNITY)

## 2023-06-24 DIAGNOSIS — E860 Dehydration: Secondary | ICD-10-CM | POA: Diagnosis not present

## 2023-06-24 DIAGNOSIS — Z515 Encounter for palliative care: Secondary | ICD-10-CM | POA: Diagnosis not present

## 2023-06-24 DIAGNOSIS — C569 Malignant neoplasm of unspecified ovary: Secondary | ICD-10-CM

## 2023-06-24 DIAGNOSIS — Z7189 Other specified counseling: Secondary | ICD-10-CM

## 2023-06-24 DIAGNOSIS — K56600 Partial intestinal obstruction, unspecified as to cause: Secondary | ICD-10-CM | POA: Diagnosis not present

## 2023-06-24 LAB — COMPREHENSIVE METABOLIC PANEL WITH GFR
ALT: 14 U/L (ref 0–44)
AST: 17 U/L (ref 15–41)
Albumin: 2.5 g/dL — ABNORMAL LOW (ref 3.5–5.0)
Alkaline Phosphatase: 52 U/L (ref 38–126)
Anion gap: 12 (ref 5–15)
BUN: 8 mg/dL (ref 8–23)
CO2: 23 mmol/L (ref 22–32)
Calcium: 8 mg/dL — ABNORMAL LOW (ref 8.9–10.3)
Chloride: 107 mmol/L (ref 98–111)
Creatinine, Ser: 0.82 mg/dL (ref 0.44–1.00)
GFR, Estimated: >60 mL/min (ref >60)
Glucose, Bld: 106 mg/dL — ABNORMAL HIGH (ref 70–99)
Potassium: 3.8 mmol/L (ref 3.5–5.1)
Sodium: 142 mmol/L (ref 135–145)
Total Bilirubin: 0.6 mg/dL (ref 0.0–1.2)
Total Protein: 5.7 g/dL — ABNORMAL LOW (ref 6.5–8.1)

## 2023-06-24 LAB — CYTOLOGY - NON PAP

## 2023-06-24 LAB — CBC WITH DIFFERENTIAL/PLATELET
Abs Immature Granulocytes: 0.01 10*3/uL (ref 0.00–0.07)
Basophils Absolute: 0 10*3/uL (ref 0.0–0.1)
Basophils Relative: 0 %
Eosinophils Absolute: 0.3 10*3/uL (ref 0.0–0.5)
Eosinophils Relative: 5 %
HCT: 38 % (ref 36.0–46.0)
Hemoglobin: 11.9 g/dL — ABNORMAL LOW (ref 12.0–15.0)
Immature Granulocytes: 0 %
Lymphocytes Relative: 30 %
Lymphs Abs: 2.2 10*3/uL (ref 0.7–4.0)
MCH: 28.7 pg (ref 26.0–34.0)
MCHC: 31.3 g/dL (ref 30.0–36.0)
MCV: 91.6 fL (ref 80.0–100.0)
Monocytes Absolute: 0.6 10*3/uL (ref 0.1–1.0)
Monocytes Relative: 8 %
Neutro Abs: 4.1 10*3/uL (ref 1.7–7.7)
Neutrophils Relative %: 57 %
Platelets: 306 10*3/uL (ref 150–400)
RBC: 4.15 MIL/uL (ref 3.87–5.11)
RDW: 15.2 % (ref 11.5–15.5)
WBC: 7.1 10*3/uL (ref 4.0–10.5)
nRBC: 0 % (ref 0.0–0.2)

## 2023-06-24 LAB — HEPARIN LEVEL (UNFRACTIONATED)
Heparin Unfractionated: 0.91 [IU]/mL — ABNORMAL HIGH (ref 0.30–0.70)
Heparin Unfractionated: 0.37 [IU]/mL (ref 0.30–0.70)

## 2023-06-24 LAB — APTT: aPTT: 127 s — ABNORMAL HIGH (ref 24–36)

## 2023-06-24 LAB — PHOSPHORUS: Phosphorus: 2.2 mg/dL — ABNORMAL LOW (ref 2.5–4.6)

## 2023-06-24 LAB — GLUCOSE, CAPILLARY
Glucose-Capillary: 94 mg/dL (ref 70–99)
Glucose-Capillary: 115 mg/dL — ABNORMAL HIGH (ref 70–99)
Glucose-Capillary: 96 mg/dL (ref 70–99)
Glucose-Capillary: 123 mg/dL — ABNORMAL HIGH (ref 70–99)
Glucose-Capillary: 124 mg/dL — ABNORMAL HIGH (ref 70–99)

## 2023-06-24 LAB — PH, BODY FLUID: pH, Body Fluid: 7.7

## 2023-06-24 LAB — MAGNESIUM: Magnesium: 2 mg/dL (ref 1.7–2.4)

## 2023-06-24 MED ORDER — FENTANYL CITRATE PF 50 MCG/ML IJ SOSY
12.5000 ug | PREFILLED_SYRINGE | INTRAMUSCULAR | Status: DC | PRN
  Administered 2023-06-25: 12.5 ug via INTRAVENOUS
  Filled 2023-06-24: qty 1

## 2023-06-24 MED ORDER — LACTATED RINGERS IV SOLN: INTRAVENOUS | Status: DC

## 2023-06-24 MED ORDER — HEPARIN (PORCINE) 25000 UT/250ML-% IV SOLN
700.0000 [IU]/h | INTRAVENOUS | Status: AC
  Administered 2023-06-25: 700 [IU]/h via INTRAVENOUS
  Filled 2023-06-24: qty 250

## 2023-06-24 MED ORDER — OCTREOTIDE ACETATE 100 MCG/ML IJ SOLN
100.0000 ug | Freq: Three times a day (TID) | INTRAMUSCULAR | Status: DC
  Administered 2023-06-24 – 2023-06-28 (×10): 100 ug via SUBCUTANEOUS
  Filled 2023-06-24 (×15): qty 1

## 2023-06-24 NOTE — Progress Notes (Addendum)
 This chaplain responded to PMT NP-Michelle referral for creating the the Pt. HCPOA per the daughter's request.  A visitor is engaging with the medical team at the time of the chaplain's visit.  This chaplain will plan a revisit.  *[phone] The chaplain revisited the unit. The chaplain learned the Pt. daughter left the bedside earlier this afternoon. This chaplain will plan a revisit.   Chaplain Kathleene Papas [phone]

## 2023-06-24 NOTE — Plan of Care (Signed)

## 2023-06-24 NOTE — Progress Notes (Signed)
 PHARMACY - ANTICOAGULATION CONSULT NOTE  Pharmacy Consult for hx of heparin  Indication: hx of DVT, on Eliquis  PTA, SBO unable to take PO   Allergies  Allergen Reactions   Pork-Derived Products     Patient Measurements: Height: 5\' 2"  (157.5 cm) Weight: 62.6 kg (138 lb 0.1 oz) IBW/kg (Calculated) : 50.1 HEPARIN DW (KG): 62.6  Vital Signs: Temp: 98 F (36.7 C) (06/04 0829) Temp Source: Oral (06/04 0343) BP: 124/69 (06/04 0829) Pulse Rate: 65 (06/04 0829)  Labs: Recent Labs    06/22/23 0305 06/22/23 0418 06/23/23 0600 06/23/23 2156 06/24/23 0759  HGB 12.4  --  10.8*  --  11.9*  HCT 38.5  --  33.7*  --  38.0  PLT 350  --  284  --  306  APTT  --   --   --  77* 127*  LABPROT  --  16.6*  --   --   --   INR  --  1.3*  --   --   --   HEPARINUNFRC  --   --   --  0.56 0.91*  CREATININE 1.24*  --  0.84  --  0.82    Estimated Creatinine Clearance: 53.1 mL/min (by C-G formula based on SCr of 0.82 mg/dL).   Medical History: Past Medical History:  Diagnosis Date   Anxiety    Dementia (HCC)    DVT complicating pregnancy    2023   Dyspnea    Headache    Hypertension    Schizophrenia (HCC)    paranoid    Assessment: Patient admitted with CC of abdominal distension, gets paracentesis outpatient for hx of ovarian cancer. Found to have SBO, per general surgery will be non-op management. On Eliquis  PTA for hx of DVT, last dose within the past week. Admitted on 6/2, Eliquis  held for paracentesis.   Heparin level came back 0.91/PTT 127. Collected appropriately. Levels are correlating.   Goal of Therapy:  Heparin level 0.3-0.7 units/ml Monitor platelets by anticoagulation protocol: Yes   Plan:  Hold heparin x30 minutes Decrease heparin to 700 units/hr Check 8 hr HL Continue to monitor H&H and platelets F/u SBO resolution to transition back to Eliquis .    Ivery Marking, PharmD, BCIDP, AAHIVP, CPP Infectious Disease Pharmacist 06/24/2023 11:05 AM

## 2023-06-24 NOTE — Progress Notes (Signed)
 PROGRESS NOTE    Kristen Colon  WUJ:[id number] DOB: [birth year] DOA: 06/22/2023 PCP: Verlene Glimpse, NP   Brief Narrative:  The patient is a chronically ill-appearing 73 year old female with a past medical history significant for paranoid schizophrenia, dementia, history of DVT on anticoagulant with apixaban , hypertension, history of metastatic ovarian cancer as diagnosed in 2016 who is currently on home hospice.  She was brought to the hospital given intermittent episodes of nausea vomiting with no bowel movement in about 6 days.    Subsequently she is found to have a bowel obstruction and General Surgery was consulted.  IR was consulted for paracentesis.  NG tube was placed for her partial small bowel obstruction and connected to low intermittent suction.  Per general surgery she is not a surgical candidate and most would recommend IR placement of venting G-tube if she does not improve.    Continues to have output and had 300 mL out of her NG in the canister this morning but abdomen remains distended and she is asking for diet. KUB showed a non-obstructive bowel gas pattern so will initiate small bowel protocol to see if NG can be removed. GYN oncology evaluation to see if they have any other recommendations and they will see the patient in the AM.   6/4: Repeat KUB with no obvious obstruction, clamping NG tube and starting on clear liquid diet.  Daughter does not want any venting G-tube or Pleurx catheter at this time.  She was requesting to see an oncologist. Oncology was already consulted-awaiting formal consult note  Assessment and Plan:  Intractable nausea and vomiting secondary to partial small bowel obstruction.  Patient also has massive ascites.  CT scan also suggest some stool in the rectal vault.  Will volume replete with IV fluids.  Correct underlying electrolyte abnormalities including hypokalemia.  Trial of smog enema will be offered.  NGT Suction. Repeat KUB in the AM. Surgery  Consulted for further evaluation. Will have patient undergo SBO protocol. If fails conservative measures is not a candidate for surgical intervention and may likely need a Palliative venting g tube based on GOC Discussions.  -Patient passing flatus, had a bowel movement yesterday.  Repeat KUB with no obvious obstruction-clamping NG tube and started on clear liquid diet   Massive ascites likely secondary to metastatic ovarian cancer and peritoneal carcinomatosis: Previously been on letrozole  and tamoxifen in 2019 and 2021 respectively.  Patient has been receiving periodic abdominal paracentesis.  S/p IR guided abdominal paracentesis for comfort.  Hold anticoagulation in anticipation for procedure.  Anticoagulation w/ Heparin gtt post-procedure. There has been prior discussion about having her obtain a Pleurx due to recurrent ascites requiring paracentesis but will defer to IR Daughter does not want Pleurx catheter at this time   Acute kidney injury most likely due to dehydration: Dehydration caused by intractable nausea and vomiting.  Patient was volume repleted with "Ringer's"  lactate at 60 mL/hr x1 day but will renew  and change to D5 /LR + 20 mEQ for another 12 hours @ 75 mL/hr. BUN/Cr went from 18/1.24 -> 11/0.84. Avoid Nephrotoxic Medications, Contrast Dyes, Hypotension and Dehydration to Ensure Adequate Renal Perfusion and will need to Renally Adjust Meds. CTM and Trend Renal Function carefully and repeat CMP in the AM    Complex cystic pelvic mass: Compatible with metastatic ovarian cancer. Patient daughter wishes to discuss options regarding treatment with oncology. 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  Patient is on chronic anticoagulation with apixaban .  Will hold anticoagulation for now due to pending abdominal paracentesis procedure and because of pSBO. Start Heparin gtt after Paracentesis  and continue until SBO improves and tolerating diet  Hypoglycemia: Improved.  Continue to monitor  Normocytic Anemia/Anemia of Malignancy: Hgb/Hct is now 10.8/33.7. Check Anemia Panel in the AM. CTM for S/Sx of Bleeding as patient is on Heparin gtt for [facility]. No overt bleeding noted. Repeat CBC in the AM    Hypokalemia: Resolved today  History of paranoid schizophrenia, bipolar disorder and cognitive impairment: Patient is on Invega  injections monthly.  Next dose on 06/21.  IV lorazepam  for anxiety.  Able to take p.o. safely continue with mirtazapine  50 mg p.o. nightly and risperidone  0.5 mg p.o. twice daily as needed for bipolar   Essential Hypertension: Will hold home regimen (ordered on MAR though) for now due to n.p.o. status.  IV hydralazine  as needed. CTM BP per protocol.   GERD/GI prophylaxis: With IV PPI with pantoprazole  40 mg every 24  Hypoalbuminemia: Albumin  at 2.5 today DVT prophylaxis: SCDs Start: 06/22/23 0610    Code Status: Do not attempt resuscitation (DNR) PRE-ARREST INTERVENTIONS DESIRED Family Communication: Lengthy discussion regarding goals of care and expectations with daughter at bedside.  Disposition Plan:  Level of care: Med-Surg Status is: Inpatient Remains inpatient appropriate because: Severity of illness   Consultants:  GYN-Onc General Surgery [facility]  Procedures:  As delineated as above  Antimicrobials:  Anti-infectives (From admission, onward)    Start     Dose/Rate Route Frequency Ordered Stop   06/22/23 0430  piperacillin-tazobactam (ZOSYN) IVPB 3.375 g        3.375 g 100 mL/hr over 30 Minutes Intravenous  Once 06/22/23 0417 06/22/23 0554       Subjective: Patient was seen and examined today.  Denies any pain, had a bowel movement yesterday and passing flatus.  Lengthy discussion with daughter regarding symptom management as she is planning a out of country trip for 2 weeks.  She does not want any G-tube or Pleurx catheter.   Requesting to see an oncologist to discuss her options.  She is not ready to give up at this time.  Objective: Vitals:   06/23/23 1956 06/24/23 0343 06/24/23 0829 06/24/23 1636  BP: 108/75 122/85 124/69 139/71  Pulse: 91 79 65 69  Resp: 16 16    Temp: 98.2 F (36.8 C) 98.2 F (36.8 C) 98 F (36.7 C) 98 F (36.7 C)  TempSrc: Oral Oral    SpO2: 94% 91% 98% 93%  Weight:      Height:        Intake/Output Summary (Last 24 hours) at 06/24/2023 1741 Last data filed at 06/24/2023 1337 Gross per 24 hour  Intake 798.39 ml  Output 500 ml  Net 298.39 ml   Filed Weights   06/22/23 0258  Weight: 62.6 kg   Examination: Physical Exam: General.  Frail and severely malnourished elderly lady, in no acute distress.  NG tube in place Pulmonary.  Lungs clear bilaterally, normal respiratory effort. CV.  Regular rate and rhythm, no JVD, rub or murmur. Abdomen.  Soft, nontender,distended, BS positive. CNS.  Alert and oriented .  No focal neurologic deficit. Extremities.  No edema, no cyanosis, pulses intact and symmetrical.  Data Reviewed: I have personally reviewed following labs and imaging studies  CBC: Recent Labs  Lab 06/22/23 0305 06/23/23 0600 06/24/23 0759  WBC 10.0  6.5 7.1  NEUTROABS  --  3.6 4.1  HGB 12.4 10.8* 11.9*  HCT 38.5 33.7* 38.0  MCV 88.3 90.3 91.6  PLT 350 284 306   Basic Metabolic Panel: Recent Labs  Lab 06/22/23 0305 06/23/23 0600 06/24/23 0759  NA 138 141 142  K 3.3* 3.2* 3.8  CL 93* 105 107  CO2 30 28 23   GLUCOSE 153* 60* 106*  BUN 18 11 8   CREATININE 1.24* 0.84 0.82  CALCIUM 9.2 8.0* 8.0*  MG  --  2.1 2.0  PHOS  --  3.4 2.2*   GFR: Estimated Creatinine Clearance: 53.1 mL/min (by C-G formula based on SCr of 0.82 mg/dL). Liver Function Tests: Recent Labs  Lab 06/22/23 0305 06/23/23 0600 06/24/23 0759  AST 24 16 17   ALT 18 15 14   ALKPHOS 68 51 52  BILITOT 0.7 0.7 0.6  PROT 6.7 5.3* 5.7*  ALBUMIN  3.0* 2.3* 2.5*   Recent Labs  Lab  06/22/23 0305  LIPASE 22   No results for input(s): "AMMONIA" in the last 168 hours. Coagulation Profile: Recent Labs  Lab 06/22/23 0418  INR 1.3*   Cardiac Enzymes: No results for input(s): "CKTOTAL", "CKMB", "CKMBINDEX", "TROPONINI" in the last 168 hours. BNP (last 3 results) No results for input(s): "PROBNP" in the last 8760 hours. HbA1C: Recent Labs    06/23/23 2156  HGBA1C 5.7*   CBG: Recent Labs  Lab 06/23/23 2059 06/24/23 0341 06/24/23 0830 06/24/23 1145 06/24/23 1636  GLUCAP 147* 94 115* 96 123*   Lipid Profile: No results for input(s): "CHOL", "HDL", "LDLCALC", "TRIG", "CHOLHDL", "LDLDIRECT" in the last 72 hours. Thyroid  Function Tests: No results for input(s): "TSH", "T4TOTAL", "FREET4", "T3FREE", "THYROIDAB" in the last 72 hours. Anemia Panel: No results for input(s): "VITAMINB12", "FOLATE", "FERRITIN", "TIBC", "IRON", "RETICCTPCT" in the last 72 hours. Sepsis Labs: Recent Labs  Lab 06/22/23 0427  LATICACIDVEN 1.3   Recent Results (from the past 240 hours)  Blood Culture (routine x 2)     Status: None (Preliminary result)   Collection Time: 06/22/23  4:18 AM   Specimen: BLOOD  Result Value Ref Range Status   Specimen Description BLOOD RIGHT ANTECUBITAL  Final   Special Requests   Final    BOTTLES DRAWN AEROBIC AND ANAEROBIC Blood Culture results may not be optimal due to an inadequate volume of blood received in culture bottles   Culture   Final    NO GROWTH 2 DAYS Performed at [facility], [address] [place], [place] [address]    Report Status PENDING  Incomplete  Blood Culture (routine x 2)     Status: None (Preliminary result)   Collection Time: 06/22/23  4:18 AM   Specimen: BLOOD  Result Value Ref Range Status   Specimen Description BLOOD LEFT ANTECUBITAL  Final   Special Requests   Final    BOTTLES DRAWN AEROBIC AND ANAEROBIC Blood Culture results may not be optimal due to an inadequate volume of blood received in culture  bottles   Culture   Final    NO GROWTH 2 DAYS Performed at [facility], [address], [place], [place] [address]    Report Status PENDING  Incomplete  Urine Culture     Status: Abnormal   Collection Time: 06/22/23  7:29 AM   Specimen: Urine, Random  Result Value Ref Range Status   Specimen Description URINE, RANDOM  Final   Special Requests NONE Reflexed from [id number]  Final   Culture (A)  Final    <10,000 COLONIES/mL INSIGNIFICANT GROWTH Performed at [facility], [address] [place], [place] [address]    Report Status 06/23/2023 FINAL  Final  Anaerobic culture w Gram Stain     Status: None (Preliminary result)   Collection Time: 06/22/23  4:26 PM   Specimen: Abdomen  Result Value Ref Range Status   Specimen Description ABDOMEN PERITONEAL  Final   Special Requests NONE  Final   Gram Stain   Final    NO WBC SEEN NO ORGANISMS SEEN Performed at [facility], [address], [place], [place] [address]    Culture   Final    NO ANAEROBES ISOLATED; CULTURE IN PROGRESS FOR 5 DAYS   Report Status PENDING  Incomplete  Body fluid culture w Gram Stain     Status: None (Preliminary result)   Collection Time: 06/22/23  4:36 PM   Specimen: Abdomen; Peritoneal Fluid  Result Value Ref Range Status   Specimen Description ABDOMEN PLEURAL  Final   Special Requests NONE  Final   Gram Stain NO WBC SEEN NO ORGANISMS SEEN   Final   Culture   Final    NO GROWTH 2 DAYS Performed at [facility], [address] [place], [place] [address]    Report Status PENDING  Incomplete    Radiology Studies: DG Abd Portable 1V-Small Bowel Obstruction Protocol-initial, 8 hr delay Result Date: 06/24/2023 CLINICAL DATA:  Follow-up small bowel obstruction EXAM: PORTABLE ABDOMEN - 1 VIEW COMPARISON:  the previous day's study FINDINGS: Gastric tube into the decompressed stomach. Scattered gas in right abdominal nondilated small bowel loops. Oral contrast material in the decompressed  colon. Patchy aortoiliac calcified plaque. Bilateral hip DJD. IMPRESSION: 1. Decompressed stomach with gastric tube in place. 2. Nonobstructive bowel gas pattern. Electronically Signed   By: Nicoletta Barrier M.D.   On: 06/24/2023 08:03   DG Abd 1 View Result Date: 06/23/2023 CLINICAL DATA:  Small-bowel obstruction EXAM: ABDOMEN - 1 VIEW COMPARISON:  Abdominal radiograph dated 06/22/2023 FINDINGS: Partially imaged enteric tube tip projects over the left upper quadrant. Nonobstructive bowel gas pattern. Large soft tissue density within the right hemiabdomen displaces bowel loops into the left hemiabdomen. No free air or pneumatosis. Excreted contrast material within the urinary bladder. No acute or substantial osseous abnormality. The sacrum and coccyx are partially obscured by overlying bowel contents. Partially imaged lung bases are clear. IMPRESSION: 1. Nonobstructive bowel gas pattern. 2. Large soft tissue density within the right hemiabdomen displaces bowel loops into the left hemiabdomen, better evaluated on prior CT. Electronically Signed   By: Limin  Xu M.D.   On: 06/23/2023 11:51   DG Abd 1 View Result Date: 06/22/2023 CLINICAL DATA:  NG tube placement. EXAM: ABDOMEN - 1 VIEW COMPARISON:  CT earlier today FINDINGS: Tip and side port of the enteric tube below the diaphragm in the stomach. Air-filled loops of bowel in the upper abdomen. IMPRESSION: Tip and side port of the enteric tube below the diaphragm in the stomach. Electronically Signed   By: Chadwick Colonel M.D.   On: 06/22/2023 19:47   Scheduled Meds:  amLODipine   5 mg Oral Daily   benztropine   0.5 mg Oral QHS   buPROPion   150 mg Oral Daily   dextrose   25 g Intravenous STAT   diazepam   5 mg Oral QHS   fentaNYL   1 patch Transdermal Q72H   insulin  aspart  0-9 Units Subcutaneous Q4H   lisinopril   10 mg  Oral Daily   memantine   5 mg Oral BID   metoprolol  tartrate  12.5 mg Oral BID   mirtazapine   15 mg Oral QHS   octreotide  100 mcg Subcutaneous  TID   pantoprazole  (PROTONIX ) IV  40 mg Intravenous Q24H   Continuous Infusions:  heparin 700 Units/hr (06/24/23 1219)   lactated ringers  50 mL/hr at 06/24/23 1102    LOS: 2 days   Luna Salinas, MD [facility] Available via Epic secure chat 7am-7pm After these hours, please refer to coverage provider listed on [email or web address] 06/24/2023, 5:41 PM
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
## 2023-06-24 NOTE — Progress Notes (Signed)
 PHARMACY - ANTICOAGULATION CONSULT NOTE  Pharmacy Consult for hx of heparin  Indication: hx of DVT, on Eliquis  PTA, SBO unable to take PO   Allergies  Allergen Reactions   Pork-Derived Products     Patient Measurements: Height: 5\' 2"  (157.5 cm) Weight: 62.6 kg (138 lb 0.1 oz) IBW/kg (Calculated) : 50.1 HEPARIN DW (KG): 62.6  Vital Signs: Temp: 98.5 F (36.9 C) (06/04 1942) Temp Source: Oral (06/04 1942) BP: 128/62 (06/04 1942) Pulse Rate: 77 (06/04 1942)  Labs: Recent Labs    06/22/23 0305 06/22/23 0418 06/23/23 0600 06/23/23 2156 06/24/23 0759 06/24/23 1929  HGB 12.4  --  10.8*  --  11.9*  --   HCT 38.5  --  33.7*  --  38.0  --   PLT 350  --  284  --  306  --   APTT  --   --   --  77* 127*  --   LABPROT  --  16.6*  --   --   --   --   INR  --  1.3*  --   --   --   --   HEPARINUNFRC  --   --   --  0.56 0.91* 0.37  CREATININE 1.24*  --  0.84  --  0.82  --     Estimated Creatinine Clearance: 53.1 mL/min (by C-G formula based on SCr of 0.82 mg/dL).   Medical History: Past Medical History:  Diagnosis Date   Anxiety    Dementia (HCC)    DVT complicating pregnancy    2023   Dyspnea    Headache    Hypertension    Schizophrenia (HCC)    paranoid    Assessment: Patient admitted with CC of abdominal distension, gets paracentesis outpatient for hx of ovarian cancer. Found to have SBO, per general surgery will be non-op management. On Eliquis  PTA for hx of DVT, last dose within the past week. Admitted on 6/2, Eliquis  held for paracentesis.   Heparin level tonight came back therapeutic at 0.37, on 700 units/hr. No s/sx of bleeding or infusion issues per nursing.   Goal of Therapy:  Heparin level 0.3-0.7 units/ml Monitor platelets by anticoagulation protocol: Yes   Plan:  Continue heparin infusion at 700 units/hr  Check 8 hr HL with AM labs  Continue to monitor H&H and platelets F/u SBO resolution to transition back to Eliquis .   Thank you for allowing  pharmacy to participate in this patient's care,  Nieves Bars, PharmD, BCCCP Clinical Pharmacist  Phone: [phone] 06/24/2023 8:18 PM  Please check AMION for all [facility] Pharmacy phone numbers After 10:00 PM, call Main Pharmacy [phone]

## 2023-06-24 NOTE — Progress Notes (Signed)
 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 She was admitted 6.2.2025 with diagnosis of patrial small bowel obstruction. [facility] was notified by her daughter of calling EMS for patient due to vomiting. Per Dr. Tessie Fila, hospice physician, this is a related hospital admission.    Visited with patient who is ambulating in the hallway with PT and her daughter upon my arrival. She reports feeling better. NGT has been clamped and she is proceeding with a trial of clear liquids. Reviewed plan of care as her daughter had heard a lot of discussion from multiple providers and could benefit from further review/ summarization.    Patient is GIP appropriate for evaluation and management of small bowel obstruction with Nasogastric tube clamped, as well as IV heparin, Dextrose , and pain medication.    Vital Signs: 98/65/16   124/69    O2 98% on RA   I&O: 802.9/700   Abnormal labs: Ca 8.0, Phos 2.2, Alb 2.5, T. Pro 5.7, HGB 11.9   Diagnostics:  One view abd xray: IMPRESSION: 1. Decompressed stomach with gastric tube in place. 2. Nonobstructive bowel gas pattern.    IV/PRN Meds: Morphine  1mg  IV x 2, LR at 50ml/hr, Heparin infusion 700units/hr, D5LR with 20K at 75 (discontinued), protonix  40 mg IV x 1, D50 25 Grams IV x 1 for blood sugar 38 with follow up value of 147     Assessment and Plan from note 6.3.25 . Intractable nausea and vomiting secondary to partial small bowel obstruction.  Patient also has massive ascites.  CT scan also suggest some stool in the rectal vault.  Will volume replete with IV fluids.  Correct underlying electrolyte abnormalities including hypokalemia.  Trial of smog enema will be offered.  NGT Suction. Repeat KUB in the AM. Surgery Consulted for further evaluation. Will have patient undergo SBO protocol. If fails conservative measures is  not a candidate for surgical intervention and may likely need a Palliative venting g tube based on GOC Discussions.    Massive ascites likely secondary to metastatic ovarian cancer and peritoneal carcinomatosis: Previously been on letrozole  and tamoxifen in 2019 and 2021 respectively.  Patient has been receiving periodic abdominal paracentesis.  S/p IR guided abdominal paracentesis for comfort.  Hold anticoagulation in anticipation for procedure.  Anticoagulation w/ Heparin gtt post-procedure. There has been prior discussion about having her obtain a Pleurx due to recurrent ascites requiring paracentesis but will defer to IR   Acute kidney injury most likely due to dehydration: Dehydration caused by intractable nausea and vomiting.  Patient was volume repleted with "Ringer's"  lactate at 60 mL/hr x1 day but will renew for another 12 hours @ 75 mL/hr. BUN/Cr went from 18/1.24 -> 11/0.84. Avoid Nephrotoxic Medications, Contrast Dyes, Hypotension and Dehydration to Ensure Adequate Renal Perfusion and will need to Renally Adjust Meds. CTM and Trend Renal Function carefully and repeat CMP in the AM    Complex cystic pelvic mass: Compatible with metastatic ovarian cancer. Patient daughter wishes to discuss options regarding treatment with oncology. 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  Patient is on chronic anticoagulation with apixaban .  Will hold anticoagulation for now due to pending abdominal paracentesis procedure and because of pSBO. Start Heparin gtt after Paracentesis and continue until SBO improves and tolerating diet  Normocytic Anemia/Anemia of Malignancy: Hgb/Hct is now 10.8/33.7. Check Anemia Panel in the AM. CTM for S/Sx of Bleeding as patient is on Heparin gtt for [facility]. No overt bleeding noted. Repeat CBC in the AM    Hypokalemia: Likely due to poor oral intake. K+ is 3.2. Replete w/ IV KCL 40  mEQ x1. CTM and Replete as Necessary. Repeat CMP in the AM    Hypoalbuminemia: Patient's Albumin  Level went from 3.0 -> 2.3. CTM and Trend and repeat CMP in the AM   Discharge Planning: Ongoing   Family Contact: spoke with daughter, [name] at bedside    IDT: Updated   Goals of Care: DNR   [name] BSN, RN, [facility] hospital liaison [phone]

## 2023-06-24 NOTE — Progress Notes (Signed)
NG tube clamped per order 

## 2023-06-24 NOTE — Consult Note (Signed)
 Gynecologic Oncology Consultation  Kristen Colon 73 y.o. female  CC:  Chief Complaint  Patient presents with   Emesis    HPI: Kristen Colon is a 73 year old female who presented to the [facility] ER on 06/22/2023 with emesis, dyspnea, no BM in 4 days, abdominal distention that started around May 30-31, 2025. Of note, she is currently a Hospice patient with [facility]. Labs in the ER included lipase, Cmet with K+ 3.3/ creatinine 1.24/ albumin  3, CBC with no abnormalities, PT/INR, blood cultures x 2 with no growth to date, lactic acid, abnormal UA with urine culture ultimately showing insignificant growth.   DG abdomen 2 view with chest on 06/22/2023 returned with: 1. Mildly dilated loops of small bowel in the left mid abdomen, nonspecific, small bowel obstruction not excluded. 2. Increased interstitial opacities with patchy bibasilar opacities, some of which may be chronic, but superimposed mild infection/pneumonia is not excluded.   CT AP with contrast followed revealing: 1. The small bowel loops within the left hemiabdomen are dilated with air-fluid levels measuring up to 3.8 cm. Transition to decreased caliber distal small bowel noted within the left hemiabdomen. Findings are compatible with partial small bowel obstruction. 2. Mild increase in moderate volume abdominopelvic ascites. 3. Stable appearance of complex cystic masses within the pelvis and right abdomen compatible with history of ovarian cancer. 4. Stable appearance of peritoneal nodules compatible with peritoneal carcinomatosis. 5. Cholelithiasis. 6. Chronic occlusion of the left common iliac artery. 7.  Aortic Atherosclerosis   The patient underwent a paracentesis on 06/22/2023 as well with 3.3 L removed. Cytology compatible with papillary serous carcinoma, culture with no anaerobes isolated, LD fluid 309. The patient was seen by General Surgery with the recommendation for conservative treatment with NG tube decompression with patient not  being a surgical candidate. IR placement of venting G tube was listed as another option.  The patient was initially seen in our office in October 2018 after being hospitalized for acute encephalopathy with a CT scan on 09/19/2016 showing interval increase in size of large right adnexal cyst measuring 10.4 x 13.4 cm, previously 10 cm. The cyst noted at this time was felt to be benign and was seen on CT in 04/24/2014. Repeat CT imaging in six months along with follow up at that time was recommended. During a psych admission in March 2019, she underwent a CT AP (as part of follow up plan) with omental disease and nodularity with ascites seen, occluded left common iliac artery, enlargement of the large pelvic mass. CA 125 on 04/13/2017 was 513. She was seen in the office on 05/12/2017 to further discuss.   Surgery was scheduled for 06/04/17 with Dr. Alphonso Aschoff for diagnostic laparoscopy with biopsies however this was delayed per the patient's daughter's request while she attempted to secure an insurance policy to cover her mother. The patient ultimately underwent surgery with Dr. Pearly Bound on 06/30/2017. 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 The patient met with Dr. Almeda Jacobs with [facility] on 07/02/2027 with palliative care consultation recommended and no systemic chemotherapy given patient's mentation and inability to verbalize disease state, symptoms etc. The daughter felt the patient should be given chemotherapy. See note for specific details from the visit.  The patient's daughter ultimately requested consultation at [facility]. She was seen by Dr. Higinio Love and was started on hormonal treatment (07/2017) until 2022 when she was lost to follow up. In March 2021, she was transitioned to tamoxifen given increase  in CA 125. In July 2022, she was started on letrozole . She was last seen by Dr. Ardena Koyanagi on 11/12/2021 with CT imaging showing progressive disease. The  recommendation at that time included ongoing hospice care, consideration of pleurX (pt/daughter declined). She was given standing orders for paracenteses as needed. There have been multiple phone calls from this time to current. The last phone call was from the daughter in April 2025 asking to make an appt with Dr. Katheryne Pane with no notes after that time.   CA125: 09/22/16: 492 04/13/17: 514 07/29/17: 432 09/25/17: 768 01/28/18: 529 04/12/19: 1924 01/31/20: 1875 10/22/21: 472       Past medical history includes paranoid schizophrenia, dementia, chronic DVT on anticoagulation (Eliquis ), hypertension, metastatic ovarian cancer that was diagnosed in 2016    Interval History: Patient reports no pain at this time. Abdomen feels full. No BM or flatus today. No nausea or emesis reported. No concerns voiced. Pt alone resting in bed. Talked with RN as well who reported no BM today. Per RN, patient did ambulate with assist and tolerated this well. Per RN, patient tolerated sips of clears.  Review of Systems: Limited given hx paranoid schizophrenia, dementia  Current Meds: Current inpatient and outpatient medications reviewed   Allergy:  Allergies  Allergen Reactions   Pork-Derived Products     Social Hx:   Social History   Socioeconomic History   Marital status: Divorced    Spouse name: Not on file   Number of children: Not on file   Years of education: Not on file   Highest education level: Not on file  Occupational History   Not on file  Tobacco Use   Smoking status: Every Day    Current packs/day: 1.00    Average packs/day: 1 pack/day for 5.0 years (5.0 ttl pk-yrs)    Types: Cigarettes   Smokeless tobacco: Never  Vaping Use   Vaping status: Never Used  Substance and Sexual Activity   Alcohol use: Not Currently   Drug use: Not Currently    Types: Marijuana   Sexual activity: Not Currently    Comment: quit 5 years ago  Other Topics Concern   Not on file  Social History Narrative   Not  on file   Social Drivers of Health   Financial Resource Strain: Not on file  Food Insecurity: No Food Insecurity (06/22/2023)   Hunger Vital Sign    Worried About Running Out of Food in the Last Year: Never true    Ran Out of Food in the Last Year: Never true  Transportation Needs: No Transportation Needs (06/22/2023)   PRAPARE - Administrator, Civil Service (Medical): No    Lack of Transportation (Non-Medical): No  Physical Activity: Not on file  Stress: Not on file  Social Connections: Moderately Isolated (06/22/2023)   Social Connection and Isolation Panel [NHANES]    Frequency of Communication with Friends and Family: Never    Frequency of Social Gatherings with Friends and Family: More than three times a week    Attends Religious Services: 1 to 4 times per year    Active Member of [employer] or Organizations: No    Attends Banker Meetings: Never    Marital Status: Divorced  Catering manager Violence: Not At Risk (06/22/2023)   Humiliation, Afraid, Rape, and Kick questionnaire    Fear of Current or Ex-Partner: No    Emotionally Abused: No    Physically Abused: No    Sexually Abused:  No    Past Surgical Hx:  Past Surgical History:  Procedure Laterality Date   DIAGNOSTIC LAPAROSCOPY     pelvic mass Dr. Pearly Bound 06-30-17   IR PARACENTESIS  03/03/2023   IR PARACENTESIS  04/10/2023   IR PARACENTESIS  05/28/2023   IR PARACENTESIS  06/22/2023   LAPAROSCOPY N/A 06/30/2017   Procedure: LAPAROSCOPY DIAGNOSTIC WITH BIOPSIES;  Surgeon: Alphonso Aschoff, MD;  Location: [facility];  Service: Gynecology;  Laterality: N/A;    Past Medical Hx:  Past Medical History:  Diagnosis Date   Anxiety    Dementia (HCC)    DVT complicating pregnancy    2023   Dyspnea    Headache    Hypertension    Schizophrenia (HCC)    paranoid    Family Hx:  Family History  Problem Relation Age of Onset   Heart disease Mother    Diabetes Sister     Vitals:  Blood pressure 124/69, pulse 65,  temperature 98 F (36.7 C), resp. rate 16, height 5\' 2"  (1.575 m), weight 138 lb 0.1 oz (62.6 kg), SpO2 98%.  DG Abdomen today: Decompressed stomach with gastric tube in place. Non-obstructive bowel gas pattern.  Physical Exam:  Alert, resting in bed in no acute distress. Oriented at this time.  Lungs clear. Heart regular in rate and rhythm Abdomen is moderately distended, firm, fluid wave present. Firm, mass like area palpated in the left mid abdominal quadrant. Hypoactive bowel sounds. NG tube in place.  No lower extremity edema.  Assessment/Plan: 73 year old female currently admitted with partial SBO on imaging with progressive low grade serous ovarian cancer. She is a patient of Dr. Ardena Koyanagi, GYN ONC at [facility] and the patient is currently a Hospice patient. Patient has been seen by General Surgery inpatient with NG tube decompression recommended and no surgical intervention advised. From GYN Onc standpoint, the patient has progressive disease on imaging with partial SBO. Agree with conservative management with surgical intervention not recommended. Dr. Orvil Bland to see patient later today to discuss recommendations moving forward and will be advised the daughter has left for the day per RN and would need a call to discuss. See addition to note from Dr. Orvil Bland for recommendations.   Suellyn Emory, NP 06/24/2023, 11:03 AM

## 2023-06-24 NOTE — Progress Notes (Signed)
 Called patient's daughter. No answer. Left voicemail introducing myself and letting her know I'd be operating at [facility] all day tomorrow. I will call her between surgeries.   Wiley Hanger MD Gynecologic Oncology

## 2023-06-24 NOTE — Evaluation (Signed)
 Occupational Therapy Evaluation Patient Details Name: Kristen Colon MRN: [id number] DOB: [birth year] Today's Date: 06/24/2023   History of Present Illness   Pt is a 73 yr old female who presented 06/22/23 with nausea, vomiting, and abdominal distention. S/p paracentesis 06/22/23. Admitted for partial SBO, massive ascites likely secondary to metastatic ovarian cancer and peritoneal carcinomatosis, and AKI likely due to dehydration. PMH: paranoid schizophrenia, dementia, history of DVT on anticoagulant with apixaban , hypertension, history of metastatic ovarian cancer as diagnosed in 2016     Clinical Impressions The pt is currently presenting with the below listed deficits (see OT problem list). During the session, she required SBA for most tasks, including supine to sit, lower body dressing/donning socks seated EOB, and sit to stand. She was noted to be with disorientation to time, place and situation, though this may be typical of her baseline. She was also noted to be with slight generalized weakness and occasional nonsensical and loosely associated thoughts. She will benefit from further OT services in the acute care setting, to maximize her safety and independence with self-care tasks & to facilitate her safe return home. Anticipate no post-acute therapy needs.      If plan is discharge home, recommend the following:   Direct supervision/assist for medications management;Direct supervision/assist for financial management;Supervision due to cognitive status;Assist for transportation;Assistance with cooking/housework     Functional Status Assessment   Patient has had a recent decline in their functional status and demonstrates the ability to make significant improvements in function in a reasonable and predictable amount of time.     Equipment Recommendations   None recommended by OT     Recommendations for Other Services         Precautions/Restrictions    Precautions Precaution/Restrictions Comments: NG tube Restrictions Weight Bearing Restrictions Per Provider Order: No     Mobility Bed Mobility Overal bed mobility: Needs Assistance Bed Mobility: Supine to Sit, Sit to Supine     Supine to sit: Supervision Sit to supine: Supervision        Transfers Overall transfer level: Needs assistance Equipment used: None Transfers: Sit to/from Stand Sit to Stand: Supervision                  Balance Overall balance assessment: Mild deficits observed, not formally tested        ADL either performed or assessed with clinical judgement   ADL Overall ADL's : Needs assistance/impaired   Eating/Feeding Details (indicate cue type and reason): NG tube. The pt presents with adequate B UE function needed to self-feed independently. Grooming: Set up;Sitting           Upper Body Dressing : Set up;Sitting   Lower Body Dressing: Set up;Supervision/safety;Sitting/lateral leans Lower Body Dressing Details (indicate cue type and reason): She demonstrated the figure 4 technique to donn socks seated EOB. Toilet Transfer: Supervision/safety;Ambulation                    Pertinent Vitals/Pain Pain Assessment Pain Assessment: No/denies pain     Extremity/Trunk Assessment Upper Extremity Assessment Upper Extremity Assessment: Overall WFL for tasks assessed;Right hand dominant   Lower Extremity Assessment Lower Extremity Assessment: Overall WFL for tasks assessed      Communication Communication Communication: No apparent difficulties   Cognition Arousal: Alert Behavior During Therapy: WFL for tasks assessed/performed Cognition: History of cognitive impairments             OT - Cognition Comments: Oriented to person, she reported the  month to be "May" and the year to be "2010."          Following commands impaired: Only follows one step commands consistently     Cueing  General Comments                  Home Living Family/patient expects to be discharged to:: Private residence Living Arrangements: Children (daughter) Available Help at Discharge: Family Type of Home: House Home Access: Stairs to enter Secretary/administrator of Steps: 1   Home Layout: Multi-level Alternate Level Stairs-Number of Steps:  (3 level home including basement, main, and upper levels. Her bedroom is upstairs. 1/2 on main level of home) Alternate Level Stairs-Rails: Left Bathroom Shower/Tub: Chief Strategy Officer: Standard     Home Equipment: Cane - single point;BSC/3in1   Additional Comments: The pt was a questionable historian at times, therefore information contained in this section should be verified.      Prior Functioning/Environment Prior Level of Function : Needs assist             Mobility Comments:  (Independent with ambulation.) ADLs Comments:  (Per the pt, she was independent with toileting and feeding, and she required occasional assist for bathing. She does not drive and she reported having in-home caregivers who clean, laundry, and prepare meals.)    OT Problem List: Decreased strength;Impaired balance (sitting and/or standing);Decreased cognition;Decreased safety awareness;Decreased knowledge of use of DME or AE   OT Treatment/Interventions: Self-care/ADL training;Therapeutic exercise;Therapeutic activities;Cognitive remediation/compensation;Patient/family education;Balance training;DME and/or AE instruction      OT Goals(Current goals can be found in the care plan section)   Acute Rehab OT Goals OT Goal Formulation: With patient Time For Goal Achievement: 07/08/23 Potential to Achieve Goals: Good ADL Goals Pt Will Perform Grooming: with modified independence;standing Pt Will Perform Upper Body Dressing: with modified independence;sitting Pt Will Perform Lower Body Dressing: with modified independence;sit to/from stand;sitting/lateral leans Pt Will Transfer to  Toilet: with modified independence;ambulating   OT Frequency:  Min 1X/week       AM-PAC OT "6 Clicks" Daily Activity     Outcome Measure Help from another person eating meals?: None Help from another person taking care of personal grooming?: None Help from another person toileting, which includes using toliet, bedpan, or urinal?: A Little Help from another person bathing (including washing, rinsing, drying)?: A Little Help from another person to put on and taking off regular upper body clothing?: A Little Help from another person to put on and taking off regular lower body clothing?: A Little 6 Click Score: 20   End of Session Equipment Utilized During Treatment: Other (comment) (N/A) Nurse Communication: Other (comment)  Activity Tolerance: Patient tolerated treatment well Patient left: in bed;with call bell/phone within reach;with bed alarm set  OT Visit Diagnosis: Other symptoms and signs involving cognitive function;Muscle weakness (generalized) (M62.81)                Time: 6045-4098 OT Time Calculation (min): 22 min Charges:  OT General Charges $OT Visit: 1 Visit OT Evaluation $OT Eval Moderate Complexity: 1 Mod    Bruin Bolger L Daysen Gundrum, OTR/L 06/24/2023, 2:55 PM

## 2023-06-24 NOTE — Evaluation (Signed)
 Physical Therapy Evaluation Patient Details Name: Kristen Colon MRN: [id number] DOB: July 21, 1950 Today's Date: 06/24/2023  History of Present Illness  Pt is a 73 y.o. female who presented 06/22/23 with nausea, vomiting, and abdominal distention. S/p paracentesis 6/2. Admitted for partial SBO, massive ascites likely secondary to metastatic ovarian cancer and peritoneal carcinomatosis, and AKI likely due to dehydration. 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 PTA, she was independent without AD for functional mobility, living with her daughter in a 2-level house with a level entrance. Her bedroom is upstairs though. She has a PCA that assists/supervises her whenever the daughter is at work in order to provide 24/7 care. Currently, the pt is demonstrating deficits in cognition (likely baseline), activity tolerance, and balance. Her daughter believes the current swaying/balance deficits are due to meds. Currently, she is needing CGA-minA with intermittent HHA to ambulate and maintain her standing balance. She will likely progress well. Her daughter reports the pt is close to her functional baseline and works out daily with a IT trainer". Thus, she will likely not need any post acute PT. Will continue to follow acutely to maximize her return to baseline prior to d/c home.        If plan is discharge home, recommend the following: A little help with walking and/or transfers;A little help with bathing/dressing/bathroom;Assistance with cooking/housework;Direct supervision/assist for medications management;Direct supervision/assist for financial management;Assist for transportation;Help with stairs or ramp for entrance   Can travel by private vehicle        Equipment Recommendations None  recommended by PT (provided balance improved as expected)  Recommendations for Other Services       Functional Status Assessment Patient has had a recent decline in their functional status and demonstrates the ability to make significant improvements in function in a reasonable and predictable amount of time.     Precautions / Restrictions Precautions Precautions: Fall;Other (comment) Precaution/Restrictions Comments: NG tube Restrictions Weight Bearing Restrictions Per Provider Order: No      Mobility  Bed Mobility Overal bed mobility: Needs Assistance Bed Mobility: Supine to Sit     Supine to sit: Supervision, HOB elevated     General bed mobility comments: HOB elevated, supervision for safety and line management    Transfers Overall transfer level: Needs assistance Equipment used: None Transfers: Sit to/from Stand Sit to Stand: Supervision           General transfer comment: Supervision for safety standing from EOB, no LOB    Ambulation/Gait Ambulation/Gait assistance: Contact guard assist, Min assist Gait Distance (Feet): 230 Feet Assistive device: None, 1 person hand held assist Gait Pattern/deviations: Step-through pattern, Decreased stride length, Staggering right, Staggering left Gait velocity: reduced Gait velocity interpretation: <1.8 ft/sec, indicate of risk for recurrent falls   General Gait Details: Pt ambulates with a mild sway, intermittently needing HHA or minA for balance. Daughter believes the sway is due to the meds while here. Otherwise, daughter reports pt appears to be functioning near baseline  Stairs Stairs: Yes Stairs assistance: Contact guard assist Stair Management: One rail Left, One rail Right, Alternating pattern, Step to pattern, Forwards Number of Stairs: 3 General stair comments: Ascends with R rail with reciprocal pattern and descends with L rail with step-to pattern. No LOB, CGA for safety  Wheelchair Mobility     Tilt  Bed    Modified Rankin (Stroke Patients Only)       Balance Overall balance assessment: Mild deficits observed, not formally tested                                           Pertinent Vitals/Pain Pain Assessment Pain Assessment: Faces Faces Pain Scale: No hurt Pain Intervention(s): Monitored during session    Home Living Family/patient expects to be discharged to:: Private residence Living Arrangements: Children (daughter) Available Help at Discharge: Family;Personal care attendant;Available 24 hours/day (PCA there when daughter is at work to provide 24/7 care) Type of Home: House Home Access: Level entry     Alternate Level Stairs-Number of Steps: flight Home Layout: Two level;Bed/bath upstairs Home Equipment: None      Prior Function Prior Level of Function : Needs assist             Mobility Comments: Independent without DME to ambulate; works out daily per daughter ADLs Comments: Needs assistance to wash her back but otherwise dresses herself and takes baths     Extremity/Trunk Assessment   Upper Extremity Assessment Upper Extremity Assessment: Defer to OT evaluation    Lower Extremity Assessment Lower Extremity Assessment: Overall WFL for tasks assessed    Cervical / Trunk Assessment Cervical / Trunk Assessment: Normal  Communication   Communication Communication: No apparent difficulties    Cognition Arousal: Alert Behavior During Therapy: Impulsive (mildly)   PT - Cognitive impairments: History of cognitive impairments                       PT - Cognition Comments: Hx of dementia. Intermittently needs redirecting and simple cues for sequencing, likely baseline. Pt talking about a hellicopter out of context at one point also Following commands: Impaired Following commands impaired: Follows multi-step commands with increased time     Cueing Cueing Techniques: Verbal cues, Tactile cues     General Comments General  comments (skin integrity, edema, etc.): encouraged frequent mobility with nursing staff as able while here and continued 24/7 care at d/c    Exercises     Assessment/Plan    PT Assessment Patient needs continued PT services  PT Problem List Decreased activity tolerance;Decreased balance;Decreased mobility;Decreased cognition       PT Treatment Interventions DME instruction;Gait training;Stair training;Functional mobility training;Therapeutic activities;Therapeutic exercise;Neuromuscular re-education;Balance training;Cognitive remediation;Patient/family education    PT Goals (Current goals can be found in the Care Plan section)  Acute Rehab PT Goals Patient Stated Goal: to go home soon PT Goal Formulation: With patient/family Time For Goal Achievement: 07/08/23 Potential to Achieve Goals: Good    Frequency Min 1X/week     Co-evaluation               AM-PAC PT "6 Clicks" Mobility  Outcome Measure Help needed turning from your back to your side while in a flat bed without using bedrails?: A Little Help needed moving from lying on your back to sitting on the side of a flat bed without using bedrails?: A Little Help needed moving to and from a bed to a chair (including a wheelchair)?: A Little Help needed standing up from a chair using your arms (e.g., wheelchair or bedside chair)?: A Little Help needed to walk in hospital room?: A Little Help needed climbing 3-5 steps with a railing? : A Little 6 Click Score: 18  End of Session   Activity Tolerance: Patient tolerated treatment well Patient left: in chair;with call bell/phone within reach;with chair alarm set;with family/visitor present;with nursing/sitter in room Nurse Communication: Mobility status PT Visit Diagnosis: Unsteadiness on feet (R26.81);Other abnormalities of gait and mobility (R26.89);Difficulty in walking, not elsewhere classified (R26.2)    Time: 1610-9604 PT Time Calculation (min) (ACUTE ONLY): 21  min   Charges:   PT Evaluation $PT Eval Moderate Complexity: 1 Mod   PT General Charges $$ ACUTE PT VISIT: 1 Visit         Vernida Goodie, PT, DPT Acute Rehabilitation Services  Office: [phone]   Ellyn Hack 06/24/2023, 12:41 PM

## 2023-06-25 DIAGNOSIS — Z7189 Other specified counseling: Secondary | ICD-10-CM

## 2023-06-25 DIAGNOSIS — Z515 Encounter for palliative care: Secondary | ICD-10-CM

## 2023-06-25 DIAGNOSIS — K56600 Partial intestinal obstruction, unspecified as to cause: Secondary | ICD-10-CM | POA: Diagnosis not present

## 2023-06-25 DIAGNOSIS — R180 Malignant ascites: Secondary | ICD-10-CM | POA: Diagnosis not present

## 2023-06-25 DIAGNOSIS — C569 Malignant neoplasm of unspecified ovary: Secondary | ICD-10-CM

## 2023-06-25 DIAGNOSIS — Z66 Do not resuscitate: Secondary | ICD-10-CM

## 2023-06-25 LAB — GLUCOSE, CAPILLARY
Glucose-Capillary: 101 mg/dL — ABNORMAL HIGH (ref 70–99)
Glucose-Capillary: 126 mg/dL — ABNORMAL HIGH (ref 70–99)
Glucose-Capillary: 151 mg/dL — ABNORMAL HIGH (ref 70–99)
Glucose-Capillary: 156 mg/dL — ABNORMAL HIGH (ref 70–99)
Glucose-Capillary: 158 mg/dL — ABNORMAL HIGH (ref 70–99)
Glucose-Capillary: 180 mg/dL — ABNORMAL HIGH (ref 70–99)
Glucose-Capillary: 38 mg/dL — CL (ref 70–99)
Glucose-Capillary: 91 mg/dL (ref 70–99)

## 2023-06-25 LAB — LIPASE, FLUID: Lipase-Fluid: 10 U/L

## 2023-06-25 LAB — BODY FLUID CULTURE W GRAM STAIN
Culture: NO GROWTH
Gram Stain: NONE SEEN

## 2023-06-25 LAB — TOTAL BILIRUBIN, BODY FLUID: Total bilirubin, fluid: 1.4 mg/dL

## 2023-06-25 MED ORDER — ENOXAPARIN SODIUM 60 MG/0.6ML IJ SOSY
60.0000 mg | PREFILLED_SYRINGE | Freq: Two times a day (BID) | INTRAMUSCULAR | Status: DC
  Administered 2023-06-25 – 2023-06-26 (×3): 60 mg via SUBCUTANEOUS
  Filled 2023-06-25 (×4): qty 0.6

## 2023-06-25 MED ORDER — DEXTROSE IN LACTATED RINGERS 5 % IV SOLN: INTRAVENOUS | Status: AC

## 2023-06-25 MED ORDER — FENTANYL CITRATE PF 50 MCG/ML IJ SOSY
12.5000 ug | PREFILLED_SYRINGE | INTRAMUSCULAR | Status: DC | PRN
  Administered 2023-06-25 – 2023-06-26 (×2): 25 ug via INTRAVENOUS
  Filled 2023-06-25 (×3): qty 1

## 2023-06-25 MED ORDER — DEXTROSE 50 % IV SOLN
25.0000 g | INTRAVENOUS | Status: AC
  Administered 2023-06-25: 25 g via INTRAVENOUS
  Filled 2023-06-25: qty 50

## 2023-06-25 NOTE — Treatment Plan (Signed)
 Brief update  The patient's daughter was not at her bedside last night, I reached out to her this afternoon.  We had about a 30-minute conversation.  We discussed recent symptoms that prompted current hospitalization.  Discussed findings on CT scan as well as small bowel follow-through.  Reviewed my concern that even if she is able to advance her diet and have bowel function, she is at high risk of recurrent partial SBO or SBO given disease burden.  I introduced again the idea of a G-tube, which has been discussed with [name] by other providers.  I reviewed that this can help with symptom relief and allow patients to leave the hospital.  At this time, she does not want to pursue a G-tube.  She is worried about her mother and who will take care of her mother while she is out of the country on an upcoming trip.  She voices concern about her mother not having a sitter when she is gone, having pulled out her NG tube earlier today.  We discussed her prior oncology care.  She asked me if there are any treatments that her mother would be a candidate for, especially new treatments.  A voice my concern that given her medical comorbidities as well as her performance status that I did not think the patient was a candidate for traditional chemotherapy.  [name] is in agreement with this.  She has previously been on hormonal therapy, which tolerated well.  This was ultimately discontinued in 2022.  I think it would be reasonable for the the patient to be restarted on hormonal therapy to hopefully help slow progression of her cancer.  This would be palliative treatment.  She voices interest in vitamin C  infusions.  They had previously been referred to a center at [facility].  We discussed that this is not something here at our cancer center.  She voices concern about going back to see Dr. Katheryne Pane given the distance of her office.  She would like her mother to be able to see a GYN oncologist at [facility].  I reviewed that our  offices are at [facility] and that we would be happy to see her mother but we do not see patients at [facility].  She would like to think about this option.  I gave her our clinic number and said we would be happy to see her mother in the outpatient setting.  Wiley Hanger MD Gynecologic Oncology

## 2023-06-25 NOTE — Progress Notes (Signed)
 Mobility Specialist Progress Note:   06/25/23 1036  Mobility  Activity Ambulated with assistance in hallway  Level of Assistance Minimal assist, patient does 75% or more  Assistive Device Other (Comment) (HHA)  Distance Ambulated (ft) 136 ft  Activity Response Tolerated well  Mobility Referral Yes  Mobility visit 1 Mobility  Mobility Specialist Start Time (ACUTE ONLY) 1000  Mobility Specialist Stop Time (ACUTE ONLY) 1016  Mobility Specialist Time Calculation (min) (ACUTE ONLY) 16 min   Pt received in bed, agreeable to mobility. C/o mild abdominal pains, otherwise no c/o. Pt required MinA for bed mobility. Pt requested  to use BSC prior to ambulating halls, pt voided. Ambulated halls w/ HHA MinA d/t unsteadiness. Returned to room w/o fault. Left in bed w/ call bell and personal belongings in reach. All needs met. Bed alarm on.  Pre Mobility RA SPO2 98% During Mobility RA SPO2 84%                          2L/min SPO2 94% Post Mobility 2L/min SPO2 94%  Inetta Manes Mobility Specialist  Please contact vis Secure Chat or  Rehab Office [phone]

## 2023-06-25 NOTE — Progress Notes (Signed)
 PROGRESS NOTE    Kristen Colon  NFA:[id number] DOB: [birth year] DOA: 06/22/2023 PCP: Patient, No Pcp Per   Brief Narrative:  The patient is a chronically ill-appearing 73 year old female with a past medical history significant for paranoid schizophrenia, dementia, history of DVT on anticoagulant with apixaban , hypertension, history of metastatic ovarian cancer as diagnosed in 2016 who is currently on home hospice.  She was brought to the hospital given intermittent episodes of nausea vomiting with no bowel movement in about 6 days.    Subsequently she is found to have a bowel obstruction and General Surgery was consulted.  IR was consulted for paracentesis.  NG tube was placed for her partial small bowel obstruction and connected to low intermittent suction.  Per general surgery she is not a surgical candidate and most would recommend IR placement of venting G-tube if she does not improve.    Continues to have output and had 300 mL out of her NG in the canister this morning but abdomen remains distended and she is asking for diet. KUB showed a non-obstructive bowel gas pattern so will initiate small bowel protocol to see if NG can be removed. GYN oncology evaluation to see if they have any other recommendations and they will see the patient in the AM.   6/4: Repeat KUB with no obvious obstruction, clamping NG tube and starting on clear liquid diet.  Daughter does not want any venting G-tube or Pleurx catheter at this time.  She was requesting to see an oncologist. Oncology was already consulted-awaiting formal consult note  6/5: Pulled NG tube earlier today, diet advanced to full liquid.  Patient did not had any more bowel movement after 6/3.  Worsening abdominal distention but does not want paracentesis at this time. Oncology is recommending outpatient follow-up.  They strongly recommend G-tube placement for venting as she will remain high risk for recurrent ileus/SBO due to cancer burden-daughter  is not ready yet.  Assessment and Plan:  Intractable nausea and vomiting secondary to partial small bowel obstruction.  Patient also has massive ascites.  CT scan also suggest some stool in the rectal vault.  Will volume replete with IV fluids.  Correct underlying electrolyte abnormalities including hypokalemia.  Trial of smog enema will be offered.  NGT Suction. Repeat KUB in the AM. Surgery Consulted for further evaluation. Will have patient undergo SBO protocol. If fails conservative measures is not a candidate for surgical intervention and may likely need a Palliative venting g tube based on GOC Discussions.  - Patient pulled out NG tube this morning.  Currently tolerating full liquid diet but did not had any more bowel movement after 6/3. - Will remain high risk for recurrent ileus and SBO due to cancer burden and oncology is recommending G-tube placement-daughter is not ready yet.  Will need replacing NG tube if develop nausea and vomiting.   Massive ascites likely secondary to metastatic ovarian cancer and peritoneal carcinomatosis: Previously been on letrozole  and tamoxifen in 2019 and 2021 respectively.  Patient has been receiving periodic abdominal paracentesis.  S/p IR guided abdominal paracentesis for comfort.  Hold anticoagulation in anticipation for procedure.  Anticoagulation w/ Heparin gtt post-procedure. There has been prior discussion about having her obtain a Pleurx due to recurrent ascites requiring paracentesis but will defer to IR Daughter does not want Pleurx catheter at this time - Worsening abdominal distention but patient does not want paracentesis today.   Acute kidney injury most likely due to dehydration: Dehydration caused by intractable nausea  and vomiting.  Patient was volume repleted with "Ringer's"  lactate at 60 mL/hr x1 day but will renew  and change to D5 /LR + 20 mEQ for another 12 hours @ 75 mL/hr. BUN/Cr went from 18/1.24 -> 11/0.84. Avoid Nephrotoxic Medications,  Contrast Dyes, Hypotension and Dehydration to Ensure Adequate Renal Perfusion and will need to Renally Adjust Meds. CTM and Trend Renal Function carefully and repeat CMP in the AM    Complex cystic pelvic mass: Compatible with metastatic ovarian cancer. Patient daughter wishes to discuss options regarding treatment with oncology. 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  Patient is on chronic anticoagulation with apixaban .  Will hold anticoagulation for now due to pending abdominal paracentesis procedure and because of pSBO. Start Heparin gtt after Paracentesis and continue until SBO improves and tolerating diet  Hypoglycemia: Improved.  Continue to monitor  Normocytic Anemia/Anemia of Malignancy: Hgb/Hct is now 10.8/33.7. Check Anemia Panel in the AM. CTM for S/Sx of Bleeding as patient is on Heparin gtt for [facility]. No overt bleeding noted. Repeat CBC in the AM    Hypokalemia: Resolved today  History of paranoid schizophrenia, bipolar disorder and cognitive impairment: Patient is on Invega  injections monthly.  Next dose on 06/21.  IV lorazepam  for anxiety.  Able to take p.o. safely continue with mirtazapine  50 mg p.o. nightly and risperidone  0.5 mg p.o. twice daily as needed for bipolar   Essential Hypertension: Will hold home regimen (ordered on MAR though) for now due to n.p.o. status.  IV hydralazine  as needed. CTM BP per protocol.   GERD/GI prophylaxis: With IV PPI with pantoprazole  40 mg every 24  Hypoalbuminemia: Albumin  at 2.5 today DVT prophylaxis: SCDs Start: 06/22/23 0610    Code Status: Do not attempt resuscitation (DNR) PRE-ARREST INTERVENTIONS DESIRED Family Communication: No family at bedside today, tried calling her daughter [name] with no response.  Had a lengthy discussion yesterday.  Disposition Plan:  Level of care: Med-Surg Status is: Inpatient Remains inpatient  appropriate because: Severity of illness   Consultants:  GYN-Onc General Surgery [facility]  Procedures:  As delineated as above  Antimicrobials:  Anti-infectives (From admission, onward)    Start     Dose/Rate Route Frequency Ordered Stop   06/22/23 0430  piperacillin-tazobactam (ZOSYN) IVPB 3.375 g        3.375 g 100 mL/hr over 30 Minutes Intravenous  Once 06/22/23 0417 06/22/23 0554       Subjective: Patient was seen and examined today.  Denies any nausea or vomiting.  Worsening abdominal distention but she does not want paracentesis today.  Did not had any bowel movement.  Objective: Vitals:   06/24/23 1636 06/24/23 1942 06/25/23 0402 06/25/23 0813  BP: 139/71 128/62 (!) 142/70 132/70  Pulse: 69 77 (!) 55 61  Resp:  16 16   Temp: 98 F (36.7 C) 98.5 F (36.9 C) 97.6 F (36.4 C) 98 F (36.7 C)  TempSrc:  Oral    SpO2: 93% 91% 99% 99%  Weight:      Height:        Intake/Output Summary (Last 24 hours) at 06/25/2023 1323 Last data filed at 06/25/2023 1200 Gross per 24 hour  Intake 1806.54 ml  Output 400 ml  Net 1406.54 ml   Filed Weights   06/22/23 0258  Weight: 62.6 kg   Examination: Physical Exam: General.  Frail and severely malnourished elderly lady, in no acute distress. Pulmonary.  Lungs clear bilaterally, normal respiratory effort. CV.  Regular rate and rhythm, no JVD, rub or murmur. Abdomen.  Distended, mild diffuse tenderness, bowel sounds positive CNS.  Alert and oriented to self.  No focal neurologic deficit. Extremities.  No edema, no cyanosis, pulses intact and symmetrical.  Data Reviewed: I have personally reviewed following labs and imaging studies  CBC: Recent Labs  Lab 06/22/23 0305 06/23/23 0600 06/24/23 0759  WBC 10.0 6.5 7.1  NEUTROABS  --  3.6 4.1  HGB 12.4 10.8* 11.9*  HCT 38.5 33.7* 38.0  MCV 88.3 90.3 91.6  PLT 350 284 306   Basic Metabolic Panel: Recent Labs  Lab 06/22/23 0305 06/23/23 0600 06/24/23 0759   NA 138 141 142  K 3.3* 3.2* 3.8  CL 93* 105 107  CO2 30 28 23   GLUCOSE 153* 60* 106*  BUN 18 11 8   CREATININE 1.24* 0.84 0.82  CALCIUM 9.2 8.0* 8.0*  MG  --  2.1 2.0  PHOS  --  3.4 2.2*   GFR: Estimated Creatinine Clearance: 53.1 mL/min (by C-G formula based on SCr of 0.82 mg/dL). Liver Function Tests: Recent Labs  Lab 06/22/23 0305 06/23/23 0600 06/24/23 0759  AST 24 16 17   ALT 18 15 14   ALKPHOS 68 51 52  BILITOT 0.7 0.7 0.6  PROT 6.7 5.3* 5.7*  ALBUMIN  3.0* 2.3* 2.5*   Recent Labs  Lab 06/22/23 0305  LIPASE 22   No results for input(s): "AMMONIA" in the last 168 hours. Coagulation Profile: Recent Labs  Lab 06/22/23 0418  INR 1.3*   Cardiac Enzymes: No results for input(s): "CKTOTAL", "CKMB", "CKMBINDEX", "TROPONINI" in the last 168 hours. BNP (last 3 results) No results for input(s): "PROBNP" in the last 8760 hours. HbA1C: Recent Labs    06/23/23 2156  HGBA1C 5.7*   CBG: Recent Labs  Lab 06/25/23 0012 06/25/23 0110 06/25/23 0404 06/25/23 0813 06/25/23 1158  GLUCAP 38* 158* 151* 101* 156*   Lipid Profile: No results for input(s): "CHOL", "HDL", "LDLCALC", "TRIG", "CHOLHDL", "LDLDIRECT" in the last 72 hours. Thyroid  Function Tests: No results for input(s): "TSH", "T4TOTAL", "FREET4", "T3FREE", "THYROIDAB" in the last 72 hours. Anemia Panel: No results for input(s): "VITAMINB12", "FOLATE", "FERRITIN", "TIBC", "IRON", "RETICCTPCT" in the last 72 hours. Sepsis Labs: Recent Labs  Lab 06/22/23 0427  LATICACIDVEN 1.3   Recent Results (from the past 240 hours)  Blood Culture (routine x 2)     Status: None (Preliminary result)   Collection Time: 06/22/23  4:18 AM   Specimen: BLOOD  Result Value Ref Range Status   Specimen Description BLOOD RIGHT ANTECUBITAL  Final   Special Requests   Final    BOTTLES DRAWN AEROBIC AND ANAEROBIC Blood Culture results may not be optimal due to an inadequate volume of blood received in culture bottles   Culture    Final    NO GROWTH 3 DAYS Performed at [facility], [address] [place], [place] [address]    Report Status PENDING  Incomplete  Blood Culture (routine x 2)     Status: None (Preliminary result)   Collection Time: 06/22/23  4:18 AM   Specimen: BLOOD  Result Value Ref Range Status   Specimen Description BLOOD LEFT ANTECUBITAL  Final   Special Requests   Final    BOTTLES DRAWN AEROBIC AND ANAEROBIC Blood Culture results may not be optimal due to an inadequate volume of blood received in culture bottles   Culture   Final    NO  GROWTH 3 DAYS Performed at [facility], [address], [place], [place] [address]    Report Status PENDING  Incomplete  Urine Culture     Status: Abnormal   Collection Time: 06/22/23  7:29 AM   Specimen: Urine, Random  Result Value Ref Range Status   Specimen Description URINE, RANDOM  Final   Special Requests NONE Reflexed from [id number]  Final   Culture (A)  Final    <10,000 COLONIES/mL INSIGNIFICANT GROWTH Performed at [facility], [address] [place], [place] [address]    Report Status 06/23/2023 FINAL  Final  Anaerobic culture w Gram Stain     Status: None (Preliminary result)   Collection Time: 06/22/23  4:26 PM   Specimen: Abdomen  Result Value Ref Range Status   Specimen Description ABDOMEN PERITONEAL  Final   Special Requests NONE  Final   Gram Stain   Final    NO WBC SEEN NO ORGANISMS SEEN Performed at [facility], [address], [place], [place] [address]    Culture   Final    NO ANAEROBES ISOLATED; CULTURE IN PROGRESS FOR 5 DAYS   Report Status PENDING  Incomplete  Body fluid culture w Gram Stain     Status: None   Collection Time: 06/22/23  4:36 PM   Specimen: Abdomen; Peritoneal Fluid  Result Value Ref Range Status   Specimen Description ABDOMEN PLEURAL  Final   Special Requests NONE  Final   Gram Stain NO WBC SEEN NO ORGANISMS SEEN   Final   Culture   Final    NO GROWTH 3 DAYS Performed at [facility], [address] [place], [place] [address]    Report Status 06/25/2023 FINAL  Final    Radiology Studies: DG Abd Portable 1V-Small Bowel Obstruction Protocol-initial, 8 hr delay Result Date: 06/24/2023 CLINICAL DATA:  Follow-up small bowel obstruction EXAM: PORTABLE ABDOMEN - 1 VIEW COMPARISON:  the previous day's study FINDINGS: Gastric tube into the decompressed stomach. Scattered gas in right abdominal nondilated small bowel loops. Oral contrast material in the decompressed colon. Patchy aortoiliac calcified plaque. Bilateral hip DJD. IMPRESSION: 1. Decompressed stomach with gastric tube in place. 2. Nonobstructive bowel gas pattern. Electronically Signed   By: Nicoletta Barrier M.D.   On: 06/24/2023 08:03   Scheduled Meds:  amLODipine   5 mg Oral Daily   benztropine   0.5 mg Oral QHS   buPROPion   150 mg Oral Daily   diazepam   5 mg Oral QHS   enoxaparin  (LOVENOX ) injection  60 mg Subcutaneous BID   fentaNYL   1 patch Transdermal Q72H   insulin  aspart  0-9 Units Subcutaneous Q4H   lisinopril   10 mg Oral Daily   memantine   5 mg Oral BID   metoprolol  tartrate  12.5 mg Oral BID   mirtazapine   15 mg Oral QHS   octreotide  100 mcg Subcutaneous TID   pantoprazole  (PROTONIX ) IV  40 mg Intravenous Q24H   Continuous Infusions:  dextrose  5% lactated ringers  75 mL/hr at 06/25/23 0238    LOS: 3 days   Luna Salinas, MD [facility] Available via Epic secure chat 7am-7pm After these hours, please refer to coverage provider listed on [email or web address] 06/25/2023, 1:23 PM

## 2023-06-25 NOTE — Progress Notes (Signed)
 Daily Progress Note   Patient Name: Kristen Colon       Date: 06/25/2023 DOB: [birth year]  Age: 73 y.o. MRN#: [id number] Attending Physician: Luna Salinas, MD Primary Care Physician: Patient, No Pcp Per Admit Date: 06/22/2023  Reason for Consultation/Follow-up: Establishing goals of care  Subjective: Patient calm resting in bed - says she has R sided abd pain. Agrees to pain medicine.  Checked on patient later in day again, pain better, mild.   Length of Stay: 3  Current Medications: Scheduled Meds:   amLODipine   5 mg Oral Daily   benztropine   0.5 mg Oral QHS   buPROPion   150 mg Oral Daily   diazepam   5 mg Oral QHS   enoxaparin  (LOVENOX ) injection  60 mg Subcutaneous BID   fentaNYL   1 patch Transdermal Q72H   insulin  aspart  0-9 Units Subcutaneous Q4H   lisinopril   10 mg Oral Daily   memantine   5 mg Oral BID   metoprolol  tartrate  12.5 mg Oral BID   mirtazapine   15 mg Oral QHS   octreotide  100 mcg Subcutaneous TID   pantoprazole  (PROTONIX ) IV  40 mg Intravenous Q24H    Continuous Infusions:  dextrose  5% lactated ringers  75 mL/hr at 06/25/23 0238    PRN Meds: albuterol , bisacodyl , dextrose , fentaNYL  (SUBLIMAZE ) injection, hydrALAZINE , ondansetron  **OR** ondansetron  (ZOFRAN ) IV, polyethylene glycol  Physical Exam Constitutional:      General: She is not in acute distress.    Appearance: She is ill-appearing.  Pulmonary:     Effort: Pulmonary effort is normal.  Skin:    General: Skin is warm and dry.  Neurological:     Mental Status: She is alert. She is disoriented.             Vital Signs: BP 132/70   Pulse 61   Temp 98 F (36.7 C)   Resp 16   Ht 5\' 2"  (1.575 m)   Wt 62.6 kg   LMP  (LMP Unknown) Comment: years ago per Pt  SpO2 99%   BMI 25.24 kg/m  SpO2: SpO2: 99  % O2 Device: O2 Device: Room Air O2 Flow Rate: O2 Flow Rate (L/min): 2 L/min  Intake/output summary:  Intake/Output Summary (Last 24 hours) at 06/25/2023 1443 Last data filed at 06/25/2023 1200 Gross per 24 hour  Intake 1606.54 ml  Output 400 ml  Net 1206.54 ml   LBM: Last BM Date : 06/23/23 Baseline Weight: Weight: 62.6 kg Most recent weight: Weight: 62.6 kg       Palliative Assessment/Data: PPS 50%      Patient Active Problem List   Diagnosis Date Noted   Dehydration 06/24/2023   Small bowel obstruction, partial (HCC) 06/22/2023   AKI (acute kidney injury) (HCC) 10/28/2021   Palliative care by specialist    Goals of care, counseling/discussion    Chronic pain due to neoplasm    Severe comorbid illness    Sepsis (HCC) 02/22/2019   Fever 02/21/2019   Pneumonia due to COVID-19 virus 01/29/2019   Acute respiratory failure with hypoxia (HCC) 01/27/2019   COVID-19 virus infection 01/27/2019   Pelvic mass in female 06/30/2017   Malignant ascites 05/12/2017   Elevated CA-125  05/12/2017   Peritoneal carcinomatosis (HCC) 05/12/2017   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   Ovarian mass 09/20/2016   Non-intractable vomiting with nausea 09/20/2016   Hypertension, uncontrolled 09/20/2016   Diabetes mellitus type 2 in nonobese (HCC) 09/20/2016   Acute encephalopathy 09/19/2016   Schizoaffective disorder, chronic condition with acute exacerbation (HCC)    Schizophrenia, undifferentiated (HCC) 06/21/2014   Delusional disorder (HCC)    Psychoses (HCC)    Gallbladder polyp 07/21/2011   Liver mass 07/21/2011    Palliative Care Assessment & Plan   HPI: 73 year old female with a past medical history significant for but limited paranoid schizophrenia, dementia, history of DVT on anticoagulant with apixaban , hypertension, history of metastatic ovarian cancer as diagnosed in 2016. Admitted in the setting of nausea and vomiting - identified to have an SBO. Has been decompressed  with NGT and some symptomatic improvement. The PMT team has been asked to support additional conversations moving forward regarding goals of care. Patient has been established with [facility] home hospice who are also in agreement with the inpatient team seeing the patient.   Assessment: Follow up today with Kristen Colon. She is having some mild abdominal pain. Appears calm and resting upon visit.   Call to daughter [name]. [name] shares her concerns and frustrations with the situation. [name] is unsure how to move forward. We discussed that patient so far has tolerated NG tube removal and is tolerating clear/full liquids.   We discuss that though current situation has improved we do worry something like this will occur again. We discuss difficulty of knowing when.   [name] has a trip planned - she leaves next Tuesday. She is worried about her mothers care during the time she is gone. We discuss hospice involvement. Respite care is discussed though not sure this could be accommodated. SNF is discussed. [name] is not sure how to proceed at this point. She requests time. She plans to hear from Dr Orvil Bland with Gyn/onc later today.   [name] is worried about her mother's pain. We discuss managing now by continuing Fentanyl  patch and using the IV fentanyl  for acute/episodic pain. [name] agrees.   Recommendations/Plan: Increased frequency of prn fentanyl  (12.5-25 mcg) [name] wants to hear from gyn/onc - Dr Orvil Bland planning to call later today Daughter unsure where she wants her mother to be while she is out of town for 2 weeks Added patients sister Alline Ivans to contacts - [name] provides this information for someone for us  to call while she is out of town  Code Status: DNR  Discharge Planning: To Be Determined  Care plan was discussed with patient's daughter [name]  Thank you for allowing the Palliative Medicine Team to assist in the care of this patient.   Total Time 50 minutes Prolonged Time Billed   no   Time spent includes: Detailed review of medical records (labs, imaging, vital signs), medically appropriate exam, discussion with treatment team, counseling and educating patient, family and/or staff, documenting clinical information, medication management and coordination of care.     *Please note that this is a verbal dictation therefore any spelling or grammatical errors are due to the "Dragon Medical One" system interpretation.  Alvino Aye, DNP, [facility] Palliative Medicine Team Team Phone # [phone]  Pager [phone]

## 2023-06-25 NOTE — Progress Notes (Addendum)
 Patient pulled her NG tube out . Made provider aware.  No bleeding and will continue to monitor

## 2023-06-25 NOTE — Plan of Care (Signed)
   Problem: Health Behavior/Discharge Planning: Goal: Ability to manage health-related needs will improve Outcome: Progressing

## 2023-06-25 NOTE — Inpatient Diabetes Management (Signed)
 Inpatient Diabetes Program Recommendations  AACE/ADA: New Consensus Statement on Inpatient Glycemic Control (2015)  Target Ranges:  Prepandial:   less than 140 mg/dL      Peak postprandial:   less than 180 mg/dL (1-2 hours)      Critically ill patients:  140 - 180 mg/dL   Lab Results  Component Value Date   GLUCAP 156 (H) 06/25/2023   HGBA1C 5.7 (H) 06/23/2023    Review of Glycemic Control  Latest Reference Range & Units 06/24/23 20:32 06/25/23 00:12 06/25/23 01:10 06/25/23 04:04 06/25/23 08:13 06/25/23 11:58  Glucose-Capillary 70 - 99 mg/dL 010 (H)  Novolog  1 unit  38 (LL) 158 (H) 151 (H) 101 (H) 156 (H)  (LL): Data is critically low (H): Data is abnormally high  Diabetes history: DM2 Outpatient Diabetes medications: Glipizide  5 mg BID Current orders for Inpatient glycemic control: Novolog  0-9 units Q4H  Inpatient Diabetes Program Recommendations:    Might consider decreasing correction scale to Novolog  0-6 units TID and discontinue Novolog  0-5 units at bedtime.  Thank you, Hays Lipschutz, MSN, CDCES Diabetes Coordinator Inpatient Diabetes Program [phone] (team pager from 8a-5p)

## 2023-06-25 NOTE — Progress Notes (Signed)
 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 She was admitted 6.2.2025 with diagnosis of patrial small bowel obstruction. [facility] was notified by her daughter of calling EMS for patient due to vomiting. Per Dr. Tessie Fila, hospice physician, this is a related hospital admission.   Ms. Nie pulled her NG during the night and so far it has not been replaced. She is awake and verbally responsive. She has been able to tolerate full liquid diet without any signs of nausea or vomiting. She remains distended. At this time plan is for patient to go to respite on Monday at [facility] if she is stable.   She currently remains inpatient appropriate with need for higher level monitoring related to recent exacerbation of symptoms.    Vital Signs: 98/101/16   132/70  spO2 96% on room air  I&O: 468/400   Abnormal labs: none new   Diagnostics: none new  IV/PRN Meds: Fentanyl  12.5mg  IVx1, LR 50cc/H IV    Problem List per progress note Dr. Alvena Aurora.5.25  Intractable nausea and vomiting secondary to partial small bowel obstruction.  Patient also has massive ascites.  CT scan also suggest some stool in the rectal vault.  Will volume replete with IV fluids.  Correct underlying electrolyte abnormalities including hypokalemia.  Trial of smog enema will be offered.  NGT Suction. Repeat KUB in the AM. Surgery Consulted for further evaluation. Will have patient undergo SBO protocol. If fails conservative measures is not a candidate for surgical intervention and may likely need a Palliative venting g tube based on GOC Discussions.  - Patient pulled out NG tube this morning.  Currently tolerating full liquid diet but did not had any more bowel movement after 6/3. - Will remain high risk for recurrent ileus and SBO due to cancer burden and oncology is recommending G-tube  placement-daughter is not ready yet.  Will need replacing NG tube if develop nausea and vomiting.   Massive ascites likely secondary to metastatic ovarian cancer and peritoneal carcinomatosis: Previously been on letrozole  and tamoxifen in 2019 and 2021 respectively.  Patient has been receiving periodic abdominal paracentesis.  S/p IR guided abdominal paracentesis for comfort.  Hold anticoagulation in anticipation for procedure.  Anticoagulation w/ Heparin gtt post-procedure. There has been prior discussion about having her obtain a Pleurx due to recurrent ascites requiring paracentesis but will defer to IR Daughter does not want Pleurx catheter at this time - Worsening abdominal distention but patient does not want paracentesis today.   Acute kidney injury most likely due to dehydration: Dehydration caused by intractable nausea and vomiting.  Patient was volume repleted with "Ringer's"  lactate at 60 mL/hr x1 day but will renew  and change to D5 /LR + 20 mEQ for another 12 hours @ 75 mL/hr. BUN/Cr went from 18/1.24 -> 11/0.84. Avoid Nephrotoxic Medications, Contrast Dyes, Hypotension and Dehydration to Ensure Adequate Renal Perfusion and will need to Renally Adjust Meds. CTM and Trend Renal Function carefully and repeat CMP in the AM    Complex cystic pelvic mass: Compatible with metastatic ovarian cancer. Patient daughter wishes to discuss options regarding treatment with oncology. 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  Patient is on chronic anticoagulation with apixaban .  Will hold anticoagulation for now due to pending abdominal paracentesis procedure and because of pSBO. Start  Heparin gtt after Paracentesis and continue until SBO improves and tolerating diet   Hypoglycemia: Improved.  Continue to monitor   Normocytic Anemia/Anemia of Malignancy: Hgb/Hct is now 10.8/33.7. Check Anemia Panel  in the AM. CTM for S/Sx of Bleeding as patient is on Heparin gtt for [facility]. No overt bleeding noted. Repeat CBC in the AM    Hypokalemia: Resolved today   History of paranoid schizophrenia, bipolar disorder and cognitive impairment: Patient is on Invega  injections monthly.  Next dose on 06/21.  IV lorazepam  for anxiety.  Able to take p.o. safely continue with mirtazapine  50 mg p.o. nightly and risperidone  0.5 mg p.o. twice daily as needed for bipolar   Essential Hypertension: Will hold home regimen (ordered on MAR though) for now due to n.p.o. status.  IV hydralazine  as needed. CTM BP per protocol.    GERD/GI prophylaxis: With IV PPI with pantoprazole  40 mg every 24   Hypoalbuminemia: Albumin  at 2.5 today    Discharge Planning: Home once stable Family contact: left voicemail for daughter IDT:  Updated  Goals of Care-  DNR  Hospice detail report and med list forwarded to medical records for placement in chart.  Please don't hesitate to reach out for any hospice related questions or concerns Dwane Gitelman, BSN, [employer] hospital liaison [phone]

## 2023-06-25 NOTE — Progress Notes (Signed)
 This chaplain is present for F/U spiritual care in the setting of creating the Pt. Advance Directive:HCPOA.   The chaplain understands the Pt. daughter-[name] prefers to be present during AD education. The chaplain understands after calling [name] there is a strong possibility [name] will not visit the Pt. today. The chaplain understands if [name] arrives today the chaplain will be paged at [phone]. Otherwise, the chaplain and [name] agreed to a revisit on Friday.  Chaplain Kathleene Papas [phone]

## 2023-06-25 NOTE — Care Management Important Message (Signed)
 Important Message  Patient Details  Name: Kristen Colon MRN: [id number] Date of Birth: 30-Dec-1950   Important Message Given:  Yes - Medicare IM     Wynonia Hedges 06/25/2023, 1:52 PM

## 2023-06-25 NOTE — Progress Notes (Signed)
 Tried to connect daughter [name] on [phone] for updates. Was unable to contact.

## 2023-06-25 NOTE — Progress Notes (Signed)
 Patient refused blood draw for CBC and Heparin level this AM. RN explained the importance of blood draw. Patient said lab technician to come later. Made the provider aware.

## 2023-06-25 NOTE — Plan of Care (Signed)
  Problem: Education: Goal: Knowledge of General Education information will improve Description: Including pain rating scale, medication(s)/side effects and non-pharmacologic comfort measures Outcome: Progressing   Problem: Clinical Measurements: Goal: Will remain free from infection Outcome: Progressing Goal: Respiratory complications will improve Outcome: Progressing Goal: Cardiovascular complication will be avoided Outcome: Progressing   Problem: Activity: Goal: Risk for activity intolerance will decrease Outcome: Progressing   Problem: Nutrition: Goal: Adequate nutrition will be maintained Outcome: Progressing   

## 2023-06-26 ENCOUNTER — Telehealth: Payer: Self-pay

## 2023-06-26 ENCOUNTER — Inpatient Hospital Stay (HOSPITAL_COMMUNITY)

## 2023-06-26 DIAGNOSIS — C786 Secondary malignant neoplasm of retroperitoneum and peritoneum: Secondary | ICD-10-CM

## 2023-06-26 DIAGNOSIS — E860 Dehydration: Secondary | ICD-10-CM

## 2023-06-26 DIAGNOSIS — G893 Neoplasm related pain (acute) (chronic): Secondary | ICD-10-CM

## 2023-06-26 DIAGNOSIS — K56600 Partial intestinal obstruction, unspecified as to cause: Secondary | ICD-10-CM | POA: Diagnosis not present

## 2023-06-26 DIAGNOSIS — C569 Malignant neoplasm of unspecified ovary: Secondary | ICD-10-CM | POA: Diagnosis not present

## 2023-06-26 DIAGNOSIS — R180 Malignant ascites: Secondary | ICD-10-CM

## 2023-06-26 DIAGNOSIS — I10 Essential (primary) hypertension: Secondary | ICD-10-CM

## 2023-06-26 LAB — GLUCOSE, CAPILLARY
Glucose-Capillary: 142 mg/dL — ABNORMAL HIGH (ref 70–99)
Glucose-Capillary: 80 mg/dL (ref 70–99)
Glucose-Capillary: 112 mg/dL — ABNORMAL HIGH (ref 70–99)

## 2023-06-26 LAB — BASIC METABOLIC PANEL WITH GFR
Anion gap: 8 (ref 5–15)
BUN: <5 mg/dL — ABNORMAL LOW (ref 8–23)
CO2: 24 mmol/L (ref 22–32)
Calcium: 8.2 mg/dL — ABNORMAL LOW (ref 8.9–10.3)
Chloride: 107 mmol/L (ref 98–111)
Creatinine, Ser: 0.74 mg/dL (ref 0.44–1.00)
GFR, Estimated: >60 mL/min (ref >60)
Glucose, Bld: 82 mg/dL (ref 70–99)
Potassium: 4 mmol/L (ref 3.5–5.1)
Sodium: 139 mmol/L (ref 135–145)

## 2023-06-26 LAB — PHOSPHORUS: Phosphorus: 2.8 mg/dL (ref 2.5–4.6)

## 2023-06-26 MED ORDER — DEXTROSE IN LACTATED RINGERS 5 % IV SOLN: INTRAVENOUS | Status: DC

## 2023-06-26 MED ORDER — FENTANYL CITRATE PF 50 MCG/ML IJ SOSY
25.0000 ug | PREFILLED_SYRINGE | INTRAMUSCULAR | Status: DC | PRN
  Administered 2023-06-27: 25 ug via INTRAVENOUS
  Filled 2023-06-26: qty 1

## 2023-06-26 MED ORDER — SODIUM CHLORIDE 0.9 % IV SOLN: INTRAVENOUS | Status: DC

## 2023-06-26 MED ORDER — DEXTROSE IN LACTATED RINGERS 5 % IV SOLN: INTRAVENOUS | Status: AC

## 2023-06-26 NOTE — Progress Notes (Signed)
 RN was medicating patient and daughter came in. She was so angry and did the bath room door shut loudly and kicked the chair.   RN treated her saying " Good Morning! " She dint reply.  RN came out and phoned the [facility] , Interior and spatial designer and asked her to talk to the daughter.

## 2023-06-26 NOTE — Progress Notes (Signed)
 Daughter called pt and spoke with her just after 9pm. Nurse walked into room and she was on the phone. Then patient handed phone to sitter, sitter spoke to patients daughter. Pt told daughter, "no I am not in pain, but I have to go to the bathroom again, it is all that I do." Pt hung up phone and then nurse asked if she was ready to be assisted to [facility]. Pt stated yes. Tolerated well.

## 2023-06-26 NOTE — Progress Notes (Signed)
 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 She transferred her care after surgery to [facility] and was started on hormonal treatment but was lost to follow-up in mid-2022. Seen again 10/2021, more symptomatic at that time with evidence of disease progression. She was sent up with standing order for paracentesis, last performed at [facility] in 12/2022. The patient has not received cytotoxic chemotherapy given concerns about her dementia, psychiatric comorbidities, and inability to understand and participate in her care.   She is currently admitted for partial SBO on imaging with progression of her cancer. Abd 1 view today with: 1. Interval removal of enteric tube. 2. Increase caliber of small bowel loops within the left hemi-abdomen which measures up to 3.3 cm on today's exam. Findings are concerning for recurrent obstruction. 3. Bulging contour of the right lower quadrant of the abdomen is noted which most likely corresponds to large cystic mass noted in the right hemiabdomen along with increasing abdominopelvic ascites.  Interval: Patient reports no nausea or emesis today. Not passing flatus. Last bm was this am. Just received pain medication for abdominal pain. Denies shortness of breath. Little relief with paracentesis.  Exam: Alert, forgetful, in no acute distress. Answering questions and conversing. Asking for chips. Lungs clear. Heart regular in rate and rhythm. Abdomen is moderately distended, firm, fluid wave No lower extrem edema.  Assessment/Plan: Long discussion occurred between the daughter and Dr. Orvil Bland. See addition to note for details. Decision made earlier today for residential hospice if a candidate and to focus on comfort measures.

## 2023-06-26 NOTE — Assessment & Plan Note (Addendum)
 06-26-2023 chronic and metastatic.  06-27-2023 chronic and metastatic.  06-28-2023 chronic.  06-28-2023 chronic, metastatic and progressive.

## 2023-06-26 NOTE — Progress Notes (Signed)
 Patient's daughter was asking for a sitter since yesterday. she got really aggressive for not Administrator, [employer]. And this AM, she phoned me and asked for updates. when I said "she is NPO after vomit last night" , she was very aggressive to me and blamed me for not keeping a sitter . Made the provider aware.

## 2023-06-26 NOTE — Progress Notes (Signed)
 Physical Therapy Treatment Patient Details Name: Kristen Colon MRN: [id number] DOB: 1950-02-10 Today's Date: 06/26/2023   History of Present Illness Pt is a 73 y.o. female who presented 06/22/23 with nausea, vomiting, and abdominal distention. S/p paracentesis 6/2. Admitted for partial SBO, massive ascites likely secondary to metastatic ovarian cancer and peritoneal carcinomatosis, and AKI likely due to dehydration. PMH: paranoid schizophrenia, dementia, history of DVT on anticoagulant with apixaban , hypertension, history of metastatic ovarian cancer as diagnosed in 2016    PT Comments  Pt in bed upon arrival to room, daughter at bedside. Pt agreeable to OOB mobility with encouragement, PT focus of session on activity tolerance progression, gait training, and balance intervention. Pt ambulated good hallway distance x2, requiring seated rest break to recover fatigue. Pt with mod unsteadiness with head turns and change in walking surfaces, unsure of pt baseline with balance as daughter stayed in the room during session. PT to continue to follow, would benefit from OPPT for baalnce if agreeable.      If plan is discharge home, recommend the following: A little help with walking and/or transfers;A little help with bathing/dressing/bathroom;Assistance with cooking/housework;Direct supervision/assist for medications management;Direct supervision/assist for financial management;Assist for transportation;Help with stairs or ramp for entrance   Can travel by private vehicle        Equipment Recommendations  None recommended by PT    Recommendations for Other Services       Precautions / Restrictions Precautions Precautions: Fall;Other (comment) Precaution/Restrictions Comments: NG tube Restrictions Weight Bearing Restrictions Per Provider Order: No     Mobility  Bed Mobility Overal bed mobility: Needs Assistance Bed Mobility: Supine to Sit, Sit to Supine     Supine to sit: Supervision, Used  rails, HOB elevated Sit to supine: Supervision, Used rails, HOB elevated        Transfers Overall transfer level: Needs assistance Equipment used: None Transfers: Sit to/from Stand, Bed to chair/wheelchair/BSC Sit to Stand: Contact guard assist Stand pivot transfers: Contact guard assist         General transfer comment: close guard for safety, stand pivot x2 to and from [facility]. Stands x8 total during session, x3 EOB and BSC, x5 as sit<>stand intervention.    Ambulation/Gait Ambulation/Gait assistance: Contact guard assist, Min assist Gait Distance (Feet): 250 Feet (x2) Assistive device: 1 person hand held assist, None Gait Pattern/deviations: Step-through pattern, Decreased stride length, Trunk flexed Gait velocity: decr     General Gait Details: occasional steadying assist, especially with challenge. cues for hallway navigation, SpO2 100% post-gait with DOE 2/4   Stairs             Wheelchair Mobility     Tilt Bed    Modified Rankin (Stroke Patients Only)       Balance Overall balance assessment: Needs assistance Sitting-balance support: No upper extremity supported, Feet supported Sitting balance-Leahy Scale: Good     Standing balance support: No upper extremity supported, During functional activity Standing balance-Leahy Scale: Fair                 High Level Balance Comments: Dynamic balance challenges: horizontal head turns, vertical head turns, turns, change in surfaces            Communication Communication Communication: No apparent difficulties  Cognition Arousal: Alert Behavior During Therapy: Impulsive (mildly)   PT - Cognitive impairments: History of cognitive impairments  PT - Cognition Comments: Hx of dementia. Following commands: Impaired Following commands impaired: Follows multi-step commands with increased time    Cueing Cueing Techniques: Verbal cues, Tactile cues  Exercises General  Exercises - Lower Extremity Mini-Sqauts: AROM, Both, 5 reps, Seated, Standing    General Comments        Pertinent Vitals/Pain Pain Assessment Pain Assessment: Faces Faces Pain Scale: Hurts a little bit Pain Location: abdomen Pain Descriptors / Indicators: Discomfort, Grimacing Pain Intervention(s): Limited activity within patient's tolerance, Monitored during session, Repositioned    Home Living                          Prior Function            PT Goals (current goals can now be found in the care plan section) Acute Rehab PT Goals Patient Stated Goal: to go home soon PT Goal Formulation: With patient/family Time For Goal Achievement: 07/08/23 Potential to Achieve Goals: Good Progress towards PT goals: Progressing toward goals    Frequency    Min 1X/week      PT Plan      Co-evaluation              AM-PAC PT "6 Clicks" Mobility   Outcome Measure  Help needed turning from your back to your side while in a flat bed without using bedrails?: A Little Help needed moving from lying on your back to sitting on the side of a flat bed without using bedrails?: A Little Help needed moving to and from a bed to a chair (including a wheelchair)?: A Little Help needed standing up from a chair using your arms (e.g., wheelchair or bedside chair)?: A Little Help needed to walk in hospital room?: A Little Help needed climbing 3-5 steps with a railing? : A Little 6 Click Score: 18    End of Session   Activity Tolerance: Patient tolerated treatment well Patient left: in bed;with bed alarm set;with call bell/phone within reach;with family/visitor present;with nursing/sitter in room Nurse Communication: Mobility status PT Visit Diagnosis: Unsteadiness on feet (R26.81);Other abnormalities of gait and mobility (R26.89);Difficulty in walking, not elsewhere classified (R26.2)     Time: 1610-9604 PT Time Calculation (min) (ACUTE ONLY): 31 min  Charges:    $Gait  Training: 8-22 mins $Neuromuscular Re-education: 8-22 mins PT General Charges $$ ACUTE PT VISIT: 1 Visit                     Shirlene Doughty, PT DPT Acute Rehabilitation Services Secure Chat Preferred  Office [phone]    Donatello Kleve E Burnadette Carrion 06/26/2023, 4:15 PM

## 2023-06-26 NOTE — Telephone Encounter (Signed)
 Kristen Colon's daughter, Kristen Colon called stating Dr.Tucker saw this patient yesterday. Kristen Colon states her mom will become a patient of Dr.Tuckers and she would like Dr.Tucker to call her at [phone]. She has several concerns about her moms care (didn't specify) and also about getting a home health nurse set up for her mom.   I told Kristen Colon, the hospital care manager usually sets up the home nurse. Kristen Colon is aware Dr.Tucker is in clinic today seeing patients. I will send the message to her. Kristen Colon voiced an understanding.

## 2023-06-26 NOTE — Progress Notes (Signed)
 Patient's daughter is requesting for a sitter for her mother. She said, "My mom removed NG tube as no one is monitoring her , she needs a sitter." Made the provider aware. As per provider, No indication for sitter.

## 2023-06-26 NOTE — Progress Notes (Signed)
 Informed by RN that patient had an episode of large-volume emesis.  She is resting comfortably now and not complaining of abdominal pain.  Patient has refused NG tube placement.  Keep n.p.o., gentle IV fluid hydration, and repeat KUB ordered.

## 2023-06-26 NOTE — Assessment & Plan Note (Addendum)
 06-26-2023 I had a long 60 min conversation with pt's dtr Kristen Colon outside of pt's room.  I explained to the daughter in very basic terms that the patient has incurable cancer.  She is not a candidate for any sort of chemotherapy.  I do not think hormonal therapy will help either.  I made the analogy of a spit ball against the moving train to help her understand hormonal therapy versus her malignant cancer at this point.  She seemed to understand this analogy.  After further discussion, Kristen Colon states that she fears the most that she will not be present when her mother passes.  Her daughter has a trip planned to [place] to help her husband deal with her sick father-in-law.  I discussed that placing the patient into residential hospice may give her the respite she needs in order to take care of the rest of her family while hospice care cares for her mother.  Discussed that there are no other options available to treat the patient's bowel obstructions other than NG tube which she does not want the patient to have.  Discussed that a feeding tube would not relieve the bowel obstruction.  I think the patient can probably tolerate some clear liquids but I doubt she can tolerate anything more than protein shakes.  After some back-and-forth, the daughter Kristen Colon has agreed to place the patient into residential hospice care and move forward with comfort care measures.  I have informed GYN oncology and also palliative care.  Hospice liaison will also be contacted.  Hopefully the patient can be discharged to residential hospice this weekend.  Continue with IV fentanyl  for continued pain.   06-27-2023 dtr understands that there is no curative chemo for patient. Dtr understands that pt's ovarian cancer will continue to progress and ultimately end pt's life. Per gyn/onc pt has a mere days to weeks to live given the advanced cancer she has.  06-28-2023 awaiting bed at [facility]  06-29-2023 Decision made yesterday to discharge  pt to home as she does not qualify for residential [facility]. Paracentesis did not help patient. Will DC her home with 100 mcg/hr fentanyl  patch as requested by dtr Kristen Colon. Pt was on 75 mcg/hr while the hospital. I am only able to provide 2 patches(6 days worth) due to [facility] law regarding opiates. Her oncologist can provide refills vs hospice. I think pt will most likely have bouts with intermittent bowel obstruction due to large intra-abdominal tumor/cancer that is most likely entangled in her small/large bowels. I think if pt eats anything more than bariatric liquid diet, she will develop acute obstruction. Therefore pt will remain on bariatric full liquid diet and NOT advanced to any solid food.

## 2023-06-26 NOTE — Progress Notes (Signed)
 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 She was admitted 6.2.2025 with diagnosis of patrial small bowel obstruction. [facility] was notified by her daughter of calling EMS for patient due to vomiting. Per Dr. Tessie Fila, hospice physician, this is a related hospital admission.   Patient had episode of vomiting during the night requiring IV anti-emetic. She is refusing to have NG tube placed at this time and had required a dose of IV Fentanyl  for pain. After afternoon meeting with MDs daughter has decided to shift to more comfort focused approach and would like to transition her mother to [facility]. Talked with hospice provider and patient will be re-assessed tomorrow   She currently remains inpatient appropriate with need for higher level monitoring related to recent exacerbation of symptoms.    Vital Signs: 98.3/62/16    153/66   spO2 98% room air  I&O: 533/300   Abnormal labs: BUN <5, Ca+ 8.2   Diagnostics:  ABDOMEN - 1 VIEW   IMPRESSION: 1. Interval removal of enteric tube. 2. Increase caliber of small bowel loops within the left hemiabdomen which measure up to 3.3 cm on today's exam. Findings are concerning for recurrent obstruction. 3. Bulging contour of the right lower quadrant of the abdomen is noted which most likely corresponds to large cystic mass noted in the right hemiabdomen along with increasing abdominopelvic ascites.    Electronically Signed   By: Kimberley Penman M.D.   On: 06/26/2023 04:15   IV/PRN Meds: Fentanyl  12.5mg  IVx1, Zofran  4mg  IV   Problem List per progress note Dr. Unk Garb 6.6.25 Intractable nausea and vomiting secondary to partial small bowel obstruction.  Patient also has massive ascites.  CT scan also suggest some stool in the rectal vault.  Will volume replete with IV fluids.  Correct underlying  electrolyte abnormalities including hypokalemia.  Trial of smog enema will be offered.  NGT Suction. Repeat KUB in the AM. Surgery Consulted for further evaluation. Will have patient undergo SBO protocol. If fails conservative measures is not a candidate for surgical intervention and may likely need a Palliative venting g tube based on GOC Discussions.  - Patient pulled out NG tube this morning.  Currently tolerating full liquid diet but did not had any more bowel movement after 6/3. - Will remain high risk for recurrent ileus and SBO due to cancer burden and oncology is recommending G-tube placement-daughter is not ready yet.  Will need replacing NG tube if develop nausea and vomiting.   Massive ascites likely secondary to metastatic ovarian cancer and peritoneal carcinomatosis: Previously been on letrozole  and tamoxifen in 2019 and 2021 respectively.  Patient has been receiving periodic abdominal paracentesis.  S/p IR guided abdominal paracentesis for comfort.  Hold anticoagulation in anticipation for procedure.  Anticoagulation w/ Heparin gtt post-procedure. There has been prior discussion about having her obtain a Pleurx due to recurrent ascites requiring paracentesis but will defer to IR Daughter does not want Pleurx catheter at this time - Worsening abdominal distention but patient does not want paracentesis today.   Acute kidney injury most likely due to dehydration: Dehydration caused by intractable nausea and vomiting.  Patient was volume repleted with "Ringer's"  lactate at 60 mL/hr x1 day but will renew  and change to D5 /LR + 20 mEQ for another 12 hours @ 75 mL/hr. BUN/Cr went from 18/1.24 -> 11/0.84. Avoid Nephrotoxic Medications, Contrast Dyes,  Hypotension and Dehydration to Ensure Adequate Renal Perfusion and will need to Renally Adjust Meds. CTM and Trend Renal Function carefully and repeat CMP in the AM    Complex cystic pelvic mass: Compatible with metastatic ovarian cancer. Patient daughter  wishes to discuss options regarding treatment with oncology. 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  Patient is on chronic anticoagulation with apixaban .  Will hold anticoagulation for now due to pending abdominal paracentesis procedure and because of pSBO. Start Heparin gtt after Paracentesis and continue until SBO improves and tolerating diet   Hypoglycemia: Improved.  Continue to monitor   Normocytic Anemia/Anemia of Malignancy: Hgb/Hct is now 10.8/33.7. Check Anemia Panel in the AM. CTM for S/Sx of Bleeding as patient is on Heparin gtt for [facility]. No overt bleeding noted. Repeat CBC in the AM    Hypokalemia: Resolved today   History of paranoid schizophrenia, bipolar disorder and cognitive impairment: Patient is on Invega  injections monthly.  Next dose on 06/21.  IV lorazepam  for anxiety.  Able to take p.o. safely continue with mirtazapine  50 mg p.o. nightly and risperidone  0.5 mg p.o. twice daily as needed for bipolar   Essential Hypertension: Will hold home regimen (ordered on MAR though) for now due to n.p.o. status.  IV hydralazine  as needed. CTM BP per protocol.    GERD/GI prophylaxis: With IV PPI with pantoprazole  40 mg every 24   Hypoalbuminemia: Albumin  at 2.5 today   HPI: Kristen Colon is a 73 y.o. female with medical history significant of paranoid schizophrenia, dementia, DVT on anticoagulation with apixaban , hypertension, metastatic ovarian cancer that was diagnosed in 2016.  Patient is on hospice at home.  She is cared for at home by her daughter.  She received periodic abdominal paracentesis.  She has been in her usual state of health daughter noted worsening abdominal distention over the past several days.  For the past 4 days, patient has been having intermittent episodes of nausea and vomiting.  Today she has been unable to keep any food down and has vomited all day.   Vomitus was described as brownish/black.  Despite treatment with promethazine , symptoms do persist hence daughter decided to bring patient to the emergency room to be further evaluated.  No associated complaints of abdominal pain fever or chills.  Per daughter, patient has not had any bowel movements over the past 4 days.   Patient is a poor historian hence most of the history was obtained from daughter.   In the ED, CT scan was obtained and did reveal evidence of partial small bowel obstruction.  There is also evidence of worsening ascites and ovarian mass.   Significant Events: Admitted 06/22/2023 for partial SBO 06-24-2023 SBO resolved 06-26-2023 SBO has returned on abd xr. Pt refused NG tube placement   Admission Labs: Lipase 22 Na 138, K 3.3, CO2 of 30, BUN 18, Scr 1.24, glu 153 WBC 10, HgB 12.4, Plt 350   Admission Imaging Studies: Acute abd series Mildly dilated loops of small bowel in the left mid abdomen, nonspecific, small bowel obstruction not excluded. 2. Increased interstitial opacities with patchy bibasilar opacities, some of which may be chronic, but superimposed mild infection/pneumonia is not excluded. CT abd/pelvis The small bowel loops within the left hemiabdomen are dilated with air-fluid levels measuring up to 3.8 cm. Transition to decreased caliber distal small bowel noted within the left hemiabdomen. Findings are compatible with partial small  bowel obstruction. 2. Mild increase in moderate volume abdominopelvic ascites. 3. Stable appearance of complex cystic masses within the pelvis and right abdomen compatible with history of ovarian cancer. 4. Stable appearance of peritoneal nodules compatible with peritoneal carcinomatosis. 5. Cholelithiasis. 6. Chronic occlusion of the left common iliac artery. 7.  Aortic Atherosclerosis   Significant Labs:     Significant Imaging Studies: 06-24-2023 abd XR Decompressed stomach with gastric tube in place. 2. Nonobstructive bowel gas  pattern 06-26-2023 Interval removal of enteric tube. 2. Increase caliber of small bowel loops within the left hemiabdomen which measure up to 3.3 cm on today's exam. Findings are concerning for recurrent obstruction. 3. Bulging contour of the right lower quadrant of the abdomen is noted which most likely corresponds to large cystic mass noted in the right hemiabdomen along with increasing abdominopelvic ascites.   Antibiotic Therapy: Anti-infectives (From admission, onward)      Start     Dose/Rate Route Frequency Ordered Stop    06/22/23 0430   piperacillin-tazobactam (ZOSYN) IVPB 3.375 g        3.375 g 100 mL/hr over 30 Minutes Intravenous  Once 06/22/23 0417 06/22/23 0554           Procedures: 06-22-2023 paracentesis for 3.3 liters   Consultants: General surgery Palliative care Gyn/Onc     Assessment and Plan: * Small bowel obstruction, partial (HCC) 06-26-2023 I think pt is intermittently obstructing. Likely due to tumor invasion or adhesions to her bowels.  KUB from today shows recurrent SBO. She had episode of vomiting last night. Pt refused NG tube. Dtr does not want it replaced.   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   Peritoneal carcinomatosis (HCC) 06-26-2023 I had a long 60 min conversation with pt's dtr [name] outside of pt's room.  I explained to the daughter in very basic terms that the patient has incurable cancer.  She is not a candidate for any sort of chemotherapy.  I do not think hormonal therapy will help either.  I made the analogy of a spit ball against the moving train to help her understand hormonal therapy versus her malignant cancer at this point.  She seemed to understand this analogy.  After further discussion, [name] states that she fears the most that she will not be present when her mother passes.  Her daughter has a trip planned to [place] to help her husband deal with her sick father-in-law.  I discussed that  placing the patient into residential hospice may give her the respite she needs in order to take care of the rest of her family while hospice care cares for her mother.  Discussed that there are no other options available to treat the patient's bowel obstructions other than NG tube which she does not want the patient to have.  Discussed that a feeding tube would not relieve the bowel obstruction.  I think the patient can probably tolerate some clear liquids but I doubt she can tolerate anything more than protein shakes.  After some back-and-forth, the daughter [name] has agreed to place the patient into residential hospice care and move forward with comfort care measures.  I have informed GYN oncology and also palliative care.  Hospice liaison will also be contacted.  Hopefully the patient can be discharged to residential hospice this weekend.  Continue with IV fentanyl  for continued pain.    Malignant ascites 06-26-2023 I had a long 60 min conversation with pt's dtr [name]  outside of pt's room.  I explained to the daughter in very basic terms that the patient has incurable cancer.  She is not a candidate for any sort of chemotherapy.  I do not think hormonal therapy will help either.  I made the analogy of a spit ball against the moving train to help her understand hormonal therapy versus her malignant cancer at this point.  She seemed to understand this analogy.  After further discussion, [name] states that she fears the most that she will not be present when her mother passes.  Her daughter has a trip planned to [place] to help her husband deal with her sick father-in-law.  I discussed that placing the patient into residential hospice may give her the respite she needs in order to take care of the rest of her family while hospice care cares for her mother.  Discussed that there are no other options available to treat the patient's bowel obstructions other than NG tube which she does not want the patient to  have.  Discussed that a feeding tube would not relieve the bowel obstruction.  I think the patient can probably tolerate some clear liquids but I doubt she can tolerate anything more than protein shakes.  After some back-and-forth, the daughter [name] has agreed to place the patient into residential hospice care and move forward with comfort care measures.  I have informed GYN oncology and also palliative care.  Hospice liaison will also be contacted.  Hopefully the patient can be discharged to residential hospice this weekend.  Continue with IV fentanyl  for continued pain.    Discharge Planning: Evaluation BP tomorrow Family contact: talked with daughter briefly IDT:  Updated  Goals of Care-  DNR  Hospice detail report and med list forwarded to medical records for placement in chart.  Please don't hesitate to reach out for any hospice related questions or concerns Dwane Gitelman, BSN, [employer] hospital liaison [phone]

## 2023-06-26 NOTE — Assessment & Plan Note (Addendum)
 06-26-2023 increase fentanyl  to 25 mcg IV prn.  06-27-2023 continue with prn IV fentanyl   06-28-2023 continue prn IV fentanyl . Used dose last night.  06-29-2023 Will DC her home with 100 mcg/hr fentanyl  patch as requested by dtr [name]. Pt was on 75 mcg/hr while the hospital. I am only able to provide 2 patches(6 days worth) due to [facility] law regarding opiates. Her oncologist can provide refills vs hospice

## 2023-06-26 NOTE — Assessment & Plan Note (Addendum)
 06-26-2023 continue with IVF for now.  06-27-2023 continue with IVF for now. Allow pt to have clear liquids for now.  06-28-2023 continue with clear liquids. Given her intermittent nature of her bowel obstruction due to tumor location and probably tumor involvement of intestines, I doubt she will be able to move past full liquids. I think any solid food would like cause a bowel obstruction.  06-29-2023 pt tolerating full bariatric liquid diet. She will remain on full bariatric liquid diet and NOT advance to solid food.

## 2023-06-26 NOTE — Progress Notes (Signed)
 Pt experienced a large amt of (approx ) of emesis. The emesis was brown in color and contained food particles. Provider notified Kristen Colon).

## 2023-06-26 NOTE — Assessment & Plan Note (Addendum)
 06-26-2023 I think pt is intermittently obstructing. Likely due to tumor invasion or adhesions to her bowels.  KUB from today shows recurrent SBO. She had episode of vomiting last night. Pt refused NG tube. Dtr does not want it replaced.  06-27-2023 no further vomiting. Pt tolerated sips/ice chips. Dtr wants pt to try clear liquids. Discussed intermittent bowel obstruction and N/V.  Will add scheduled IV zofran  to prevent any N/V when starting clear liquids. Continue with prn IV fentanyl  25 mcg for pain.  06-28-2023 continue with clear liquids. Only taking 800 ml/day thus far. Continue with scheduled IV zofran . Continue with IV fentanyl  prn for pain control  06-29-2023 I think pt will most likely have bouts with intermittent bowel obstruction due to large intra-abdominal tumor/cancer that is most likely entangled in her small/large bowels. I think if pt eats anything more than bariatric liquid diet, she will develop acute obstruction. Therefore pt will remain on bariatric full liquid diet and NOT advanced to any solid food.

## 2023-06-26 NOTE — Assessment & Plan Note (Addendum)
 06-26-2023 chronic.  06-27-2023 chronic.  06-28-2023 chronic.  06-29-2023 stable.

## 2023-06-26 NOTE — Assessment & Plan Note (Addendum)
 06-26-2023 stop HTN meds. Pt is now comfort care.  06-27-2023 chronic. Remains on lopressor  for now to prevent any tachycardia from beta-blocker withdrawal. Hold parameters placed.  06-28-2023 chronic.  06-29-2023 continue with lopressor  only now. Not really for BP control but sudden cessation of betablocker could results in withdrawal-tachycardia. Stop checking blood pressures at home.

## 2023-06-26 NOTE — Subjective & Objective (Addendum)
 Pt seen and examined. Pt's dtr not at bedside Pt sleeping comfortably. Only took in 800 ml in fluids yesterday. Still receiving IV fentanyl  for pain Remains on IV zofran  scheduled to prevent nausea.

## 2023-06-26 NOTE — Assessment & Plan Note (Addendum)
 06-26-2023 I had a long 60 min conversation with pt's dtr [name] outside of pt's room.  I explained to the daughter in very basic terms that the patient has incurable cancer.  She is not a candidate for any sort of chemotherapy.  I do not think hormonal therapy will help either.  I made the analogy of a spit ball against the moving train to help her understand hormonal therapy versus her malignant cancer at this point.  She seemed to understand this analogy.  After further discussion, [name] states that she fears the most that she will not be present when her mother passes.  Her daughter has a trip planned to [place] to help her husband deal with her sick father-in-law.  I discussed that placing the patient into residential hospice may give her the respite she needs in order to take care of the rest of her family while hospice care cares for her mother.  Discussed that there are no other options available to treat the patient's bowel obstructions other than NG tube which she does not want the patient to have.  Discussed that a feeding tube would not relieve the bowel obstruction.  I think the patient can probably tolerate some clear liquids but I doubt she can tolerate anything more than protein shakes.  After some back-and-forth, the daughter [name] has agreed to place the patient into residential hospice care and move forward with comfort care measures.  I have informed GYN oncology and also palliative care.  Hospice liaison will also be contacted.  Hopefully the patient can be discharged to residential hospice this weekend.  Continue with IV fentanyl  for continued pain.  06-27-2023 dtr understands that there is no curative chemo for patient. Dtr understands that pt's ovarian cancer will continue to progress and ultimately end pt's life. Per gyn/onc pt has a mere days to weeks to live given the advanced cancer she has.  06-28-2023 awaiting bed at [facility]  06-29-2023 Decision made yesterday to discharge  pt to home as she does not qualify for residential [facility]. Paracentesis did not help patient. Will DC her home with 100 mcg/hr fentanyl  patch as requested by dtr [name]. Pt was on 75 mcg/hr while the hospital. I am only able to provide 2 patches(6 days worth) due to [facility] law regarding opiates. Her oncologist can provide refills vs hospice.

## 2023-06-26 NOTE — Progress Notes (Signed)
 Mobility Specialist Progress Note:    06/26/23 1100  Mobility  Activity Ambulated with assistance in hallway  Level of Assistance Minimal assist, patient does 75% or more  Assistive Device Other (Comment) (HHA)  Distance Ambulated (ft) 80 ft  Activity Response Tolerated well  Mobility Referral Yes  Mobility visit 1 Mobility  Mobility Specialist Start Time (ACUTE ONLY) 0944  Mobility Specialist Stop Time (ACUTE ONLY) 0954  Mobility Specialist Time Calculation (min) (ACUTE ONLY) 10 min   Pt received in bed, agreeable to mobility. C/o abdominal pain, otherwise no c/o. MinA HHA for STS and during ambulation d/t unsteadiness. Returned to room w/o fault. Left in bed w/ call bell and personal belongings in reach. All needs met. Bed alarm on.  Inetta Manes Mobility Specialist  Please contact vis Secure Chat or  Rehab Office [phone]

## 2023-06-26 NOTE — Progress Notes (Signed)
 PROGRESS NOTE    Kristen Colon  MVH:[id number] DOB: January 08, 1951 DOA: 06/22/2023 PCP: Patient, No Pcp Per  Subjective: Pt seen and examined. Met with pt's dtr linda at bedside. Pt c/o abd pain and shoulder pain.   Hospital Course: The patient is a chronically ill-appearing 73 year old female with a past medical history significant for paranoid schizophrenia, dementia, history of DVT on anticoagulant with apixaban , hypertension, history of metastatic ovarian cancer as diagnosed in 2016 who is currently on home hospice.  She was brought to the hospital given intermittent episodes of nausea vomiting with no bowel movement in about 6 days.    Subsequently she is found to have a bowel obstruction and General Surgery was consulted.  IR was consulted for paracentesis.  NG tube was placed for her partial small bowel obstruction and connected to low intermittent suction.  Per general surgery she is not a surgical candidate and most would recommend IR placement of venting G-tube if she does not improve.    Continues to have output and had 300 mL out of her NG in the canister this morning but abdomen remains distended and she is asking for diet. KUB showed a non-obstructive bowel gas pattern so will initiate small bowel protocol to see if NG can be removed. GYN oncology evaluation to see if they have any other recommendations and they will see the patient in the AM.   6/4: Repeat KUB with no obvious obstruction, clamping NG tube and starting on clear liquid diet.  Daughter does not want any venting G-tube or Pleurx catheter at this time.  She was requesting to see an oncologist. Oncology was already consulted-awaiting formal consult note  6/5: Pulled NG tube earlier today, diet advanced to full liquid.  Patient did not had any more bowel movement after 6/3.  Worsening abdominal distention but does not want paracentesis at this time. Oncology is recommending outpatient follow-up.  They strongly recommend  G-tube placement for venting as she will remain high risk for recurrent ileus/SBO due to cancer burden-daughter is not ready yet.  Assessment and Plan:  Intractable nausea and vomiting secondary to partial small bowel obstruction.  Patient also has massive ascites.  CT scan also suggest some stool in the rectal vault.  Will volume replete with IV fluids.  Correct underlying electrolyte abnormalities including hypokalemia.  Trial of smog enema will be offered.  NGT Suction. Repeat KUB in the AM. Surgery Consulted for further evaluation. Will have patient undergo SBO protocol. If fails conservative measures is not a candidate for surgical intervention and may likely need a Palliative venting g tube based on GOC Discussions.  - Patient pulled out NG tube this morning.  Currently tolerating full liquid diet but did not had any more bowel movement after 6/3. - Will remain high risk for recurrent ileus and SBO due to cancer burden and oncology is recommending G-tube placement-daughter is not ready yet.  Will need replacing NG tube if develop nausea and vomiting.   Massive ascites likely secondary to metastatic ovarian cancer and peritoneal carcinomatosis: Previously been on letrozole  and tamoxifen in 2019 and 2021 respectively.  Patient has been receiving periodic abdominal paracentesis.  S/p IR guided abdominal paracentesis for comfort.  Hold anticoagulation in anticipation for procedure.  Anticoagulation w/ Heparin gtt post-procedure. There has been prior discussion about having her obtain a Pleurx due to recurrent ascites requiring paracentesis but will defer to IR Daughter does not want Pleurx catheter at this time - Worsening abdominal distention but patient does  not want paracentesis today.   Acute kidney injury most likely due to dehydration: Dehydration caused by intractable nausea and vomiting.  Patient was volume repleted with "Ringer's"  lactate at 60 mL/hr x1 day but will renew  and change to D5 /LR +  20 mEQ for another 12 hours @ 75 mL/hr. BUN/Cr went from 18/1.24 -> 11/0.84. Avoid Nephrotoxic Medications, Contrast Dyes, Hypotension and Dehydration to Ensure Adequate Renal Perfusion and will need to Renally Adjust Meds. CTM and Trend Renal Function carefully and repeat CMP in the AM    Complex cystic pelvic mass: Compatible with metastatic ovarian cancer. Patient daughter wishes to discuss options regarding treatment with oncology. 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  Patient is on chronic anticoagulation with apixaban .  Will hold anticoagulation for now due to pending abdominal paracentesis procedure and because of pSBO. Start Heparin gtt after Paracentesis and continue until SBO improves and tolerating diet  Hypoglycemia: Improved.  Continue to monitor  Normocytic Anemia/Anemia of Malignancy: Hgb/Hct is now 10.8/33.7. Check Anemia Panel in the AM. CTM for S/Sx of Bleeding as patient is on Heparin gtt for [facility]. No overt bleeding noted. Repeat CBC in the AM    Hypokalemia: Resolved today  History of paranoid schizophrenia, bipolar disorder and cognitive impairment: Patient is on Invega  injections monthly.  Next dose on 06/21.  IV lorazepam  for anxiety.  Able to take p.o. safely continue with mirtazapine  50 mg p.o. nightly and risperidone  0.5 mg p.o. twice daily as needed for bipolar   Essential Hypertension: Will hold home regimen (ordered on MAR though) for now due to n.p.o. status.  IV hydralazine  as needed. CTM BP per protocol.   GERD/GI prophylaxis: With IV PPI with pantoprazole  40 mg every 24  Hypoalbuminemia: Albumin  at 2.5 today  HPI: Kristen Colon is a 73 y.o. female with medical history significant of paranoid schizophrenia, dementia, DVT on anticoagulation with apixaban , hypertension, metastatic ovarian cancer that was diagnosed in 2016.  Patient is on hospice at home.   She is cared for at home by her daughter.  She received periodic abdominal paracentesis.  She has been in her usual state of health daughter noted worsening abdominal distention over the past several days.  For the past 4 days, patient has been having intermittent episodes of nausea and vomiting.  Today she has been unable to keep any food down and has vomited all day.  Vomitus was described as brownish/black.  Despite treatment with promethazine , symptoms do persist hence daughter decided to bring patient to the emergency room to be further evaluated.  No associated complaints of abdominal pain fever or chills.  Per daughter, patient has not had any bowel movements over the past 4 days.   Patient is a poor historian hence most of the history was obtained from daughter.   In the ED, CT scan was obtained and did reveal evidence of partial small bowel obstruction.  There is also evidence of worsening ascites and ovarian mass.  Significant Events: Admitted 06/22/2023 for partial SBO 06-24-2023 SBO resolved 06-26-2023 SBO has returned on abd xr. Pt refused NG tube placement  Admission Labs: Lipase 22 Na 138, K 3.3, CO2 of 30, BUN 18, Scr 1.24, glu 153 WBC 10, HgB 12.4, Plt 350  Admission Imaging Studies: Acute abd series Mildly dilated loops of small bowel in the left mid abdomen, nonspecific, small bowel obstruction not excluded. 2. Increased interstitial opacities with  patchy bibasilar opacities, some of which may be chronic, but superimposed mild infection/pneumonia is not excluded. CT abd/pelvis The small bowel loops within the left hemiabdomen are dilated with air-fluid levels measuring up to 3.8 cm. Transition to decreased caliber distal small bowel noted within the left hemiabdomen. Findings are compatible with partial small bowel obstruction. 2. Mild increase in moderate volume abdominopelvic ascites. 3. Stable appearance of complex cystic masses within the pelvis and right abdomen compatible  with history of ovarian cancer. 4. Stable appearance of peritoneal nodules compatible with peritoneal carcinomatosis. 5. Cholelithiasis. 6. Chronic occlusion of the left common iliac artery. 7.  Aortic Atherosclerosis  Significant Labs:   Significant Imaging Studies: 06-24-2023 abd XR Decompressed stomach with gastric tube in place. 2. Nonobstructive bowel gas pattern 06-26-2023 Interval removal of enteric tube. 2. Increase caliber of small bowel loops within the left hemiabdomen which measure up to 3.3 cm on today's exam. Findings are concerning for recurrent obstruction. 3. Bulging contour of the right lower quadrant of the abdomen is noted which most likely corresponds to large cystic mass noted in the right hemiabdomen along with increasing abdominopelvic ascites.  Antibiotic Therapy: Anti-infectives (From admission, onward)    Start     Dose/Rate Route Frequency Ordered Stop   06/22/23 0430  piperacillin-tazobactam (ZOSYN) IVPB 3.375 g        3.375 g 100 mL/hr over 30 Minutes Intravenous  Once 06/22/23 0417 06/22/23 0554       Procedures: 06-22-2023 paracentesis for 3.3 liters  Consultants: General surgery Palliative care Gyn/Onc    Assessment and Plan: * Small bowel obstruction, partial (HCC) 06-26-2023 I think pt is intermittently obstructing. Likely due to tumor invasion or adhesions to her bowels.  KUB from today shows recurrent SBO. She had episode of vomiting last night. Pt refused NG tube. Dtr does not want it replaced.  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  Peritoneal carcinomatosis (HCC) 06-26-2023 I had a long 60 min conversation with pt's dtr [name] outside of pt's room.  I explained to the daughter in very basic terms that the patient has incurable cancer.  She is not a candidate for any sort of chemotherapy.  I do not think hormonal therapy will help either.  I made the analogy of a spit ball against the moving train to help  her understand hormonal therapy versus her malignant cancer at this point.  She seemed to understand this analogy.  After further discussion, [name] states that she fears the most that she will not be present when her mother passes.  Her daughter has a trip planned to [place] to help her husband deal with her sick father-in-law.  I discussed that placing the patient into residential hospice may give her the respite she needs in order to take care of the rest of her family while hospice care cares for her mother.  Discussed that there are no other options available to treat the patient's bowel obstructions other than NG tube which she does not want the patient to have.  Discussed that a feeding tube would not relieve the bowel obstruction.  I think the patient can probably tolerate some clear liquids but I doubt she can tolerate anything more than protein shakes.  After some back-and-forth, the daughter [name] has agreed to place the patient into residential hospice care and move forward with comfort care measures.  I have informed GYN oncology and also palliative care.  Hospice liaison will also be  contacted.  Hopefully the patient can be discharged to residential hospice this weekend.  Continue with IV fentanyl  for continued pain.   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  I explained to the daughter in very basic terms that the patient has incurable cancer.  She is not a candidate for any sort of chemotherapy.  I do not think hormonal therapy will help either.  I made the analogy of a spit ball against the moving train to help her understand hormonal therapy versus her malignant cancer at this point.  She seemed to understand this analogy.  After further discussion, [name] states that she fears the most that she will not be present when her mother passes.  Her daughter has a trip planned to [place] to help her husband deal with her sick  father-in-law.  I discussed that placing the patient into residential hospice may give her the respite she needs in order to take care of the rest of her family while hospice care cares for her mother.  Discussed that there are no other options available to treat the patient's bowel obstructions other than NG tube which she does not want the patient to have.  Discussed that a feeding tube would not relieve the bowel obstruction.  I think the patient can probably tolerate some clear liquids but I doubt she can tolerate anything more than protein shakes.  After some back-and-forth, the daughter [name] has agreed to place the patient into residential hospice care and move forward with comfort care measures.  I have informed GYN oncology and also palliative care.  Hospice liaison will also be contacted.  Hopefully the patient can be discharged to residential hospice this weekend.  Continue with IV fentanyl  for continued pain.  Dehydration 06-26-2023 continue with IVF for now.  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  Essential hypertension 06-26-2023 stop HTN meds. Pt is now comfort care.  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   DVT prophylaxis: SCDs Start: 06/22/23 1610    Code Status: Do not attempt resuscitation (DNR) PRE-ARREST INTERVENTIONS DESIRED Family Communication: discussed at length with dtr [name] at bedside and outside pt's room. Disposition Plan: hospice home Reason for continuing need for hospitalization: remains on IV fentanyl . Will increase dose to 25 mcg IV q2h prn.  Objective: Vitals:   06/25/23 1909 06/26/23 0355 06/26/23 0759 06/26/23 1537  BP: 138/73 129/60 125/61 (!) 153/66  Pulse: 71 73 62   Resp: 16 16 18 16   Temp: 98 F (36.7 C) 99.1 F (37.3 C) (!) 97.5 F (36.4 C) 98.3 F (36.8 C)  TempSrc: Oral Oral Oral Oral  SpO2: 94% 92% 100% 98%  Weight:      Height:        Intake/Output Summary (Last 24 hours) at  06/26/2023 1640 Last data filed at 06/26/2023 0800 Gross per 24 hour  Intake 733 ml  Output 300 ml  Net 433 ml   Filed Weights   06/22/23 0258  Weight: 62.6 kg    Examination:  Physical Exam Vitals and nursing note reviewed.  Constitutional:      Comments: Awake. confused  HENT:     Head: Normocephalic and atraumatic.  Cardiovascular:     Rate and Rhythm: Normal rate.  Abdominal:     General: There is distension.     Comments: Firm/hard soft tissue mass. More on right side of abd.  Skin:    General: Skin  is warm and dry.  Neurological:     Mental Status: She is disoriented.     Comments: Pleasantly demented     Data Reviewed: I have personally reviewed following labs and imaging studies  CBC: Recent Labs  Lab 06/22/23 0305 06/23/23 0600 06/24/23 0759  WBC 10.0 6.5 7.1  NEUTROABS  --  3.6 4.1  HGB 12.4 10.8* 11.9*  HCT 38.5 33.7* 38.0  MCV 88.3 90.3 91.6  PLT 350 284 306   Basic Metabolic Panel: Recent Labs  Lab 06/22/23 0305 06/23/23 0600 06/24/23 0759 06/26/23 0715  NA 138 141 142 139  K 3.3* 3.2* 3.8 4.0  CL 93* 105 107 107  CO2 30 28 23 24   GLUCOSE 153* 60* 106* 82  BUN 18 11 8  <5*  CREATININE 1.24* 0.84 0.82 0.74  CALCIUM 9.2 8.0* 8.0* 8.2*  MG  --  2.1 2.0  --   PHOS  --  3.4 2.2* 2.8   GFR: Estimated Creatinine Clearance: 54.5 mL/min (by C-G formula based on SCr of 0.74 mg/dL). Liver Function Tests: Recent Labs  Lab 06/22/23 0305 06/23/23 0600 06/24/23 0759  AST 24 16 17   ALT 18 15 14   ALKPHOS 68 51 52  BILITOT 0.7 0.7 0.6  PROT 6.7 5.3* 5.7*  ALBUMIN  3.0* 2.3* 2.5*   Recent Labs  Lab 06/22/23 0305  LIPASE 22   Coagulation Profile: Recent Labs  Lab 06/22/23 0418  INR 1.3*   HbA1C: Recent Labs    06/23/23 2156  HGBA1C 5.7*   CBG: Recent Labs  Lab 06/25/23 1910 06/25/23 2351 06/26/23 0353 06/26/23 0757 06/26/23 1145  GLUCAP 126* 91 142* 80 112*   Sepsis Labs: Recent Labs  Lab 06/22/23 0427  LATICACIDVEN  1.3    Recent Results (from the past 240 hours)  Blood Culture (routine x 2)     Status: None (Preliminary result)   Collection Time: 06/22/23  4:18 AM   Specimen: BLOOD  Result Value Ref Range Status   Specimen Description BLOOD RIGHT ANTECUBITAL  Final   Special Requests   Final    BOTTLES DRAWN AEROBIC AND ANAEROBIC Blood Culture results may not be optimal due to an inadequate volume of blood received in culture bottles   Culture   Final    NO GROWTH 4 DAYS Performed at [facility], [address], [place], [place] [address]    Report Status PENDING  Incomplete  Blood Culture (routine x 2)     Status: None (Preliminary result)   Collection Time: 06/22/23  4:18 AM   Specimen: BLOOD  Result Value Ref Range Status   Specimen Description BLOOD LEFT ANTECUBITAL  Final   Special Requests   Final    BOTTLES DRAWN AEROBIC AND ANAEROBIC Blood Culture results may not be optimal due to an inadequate volume of blood received in culture bottles   Culture   Final    NO GROWTH 4 DAYS Performed at [facility], [address], [place], [place] [address]    Report Status PENDING  Incomplete  Urine Culture     Status: Abnormal   Collection Time: 06/22/23  7:29 AM   Specimen: Urine, Random  Result Value Ref Range Status   Specimen Description URINE, RANDOM  Final   Special Requests NONE Reflexed from [id number]  Final   Culture (A)  Final    <10,000 COLONIES/mL INSIGNIFICANT GROWTH Performed at [facility], [address], [place], [place] [address]  Report Status 06/23/2023 FINAL  Final  Anaerobic culture w Gram Stain     Status: None (Preliminary result)   Collection Time: 06/22/23  4:26 PM   Specimen: Abdomen  Result Value Ref Range Status   Specimen Description ABDOMEN PERITONEAL  Final   Special Requests NONE  Final   Gram Stain   Final    NO WBC SEEN NO ORGANISMS SEEN Performed at [facility], [address], [place], [place] [address]    Culture    Final    NO ANAEROBES ISOLATED; CULTURE IN PROGRESS FOR 5 DAYS   Report Status PENDING  Incomplete  Body fluid culture w Gram Stain     Status: None   Collection Time: 06/22/23  4:36 PM   Specimen: Abdomen; Peritoneal Fluid  Result Value Ref Range Status   Specimen Description ABDOMEN PLEURAL  Final   Special Requests NONE  Final   Gram Stain NO WBC SEEN NO ORGANISMS SEEN   Final   Culture   Final    NO GROWTH 3 DAYS Performed at [facility], [address], [place], [place] [address]    Report Status 06/25/2023 FINAL  Final     Radiology Studies: DG Abd 1 View Result Date: 06/26/2023 CLINICAL DATA:  Vomiting. EXAM: ABDOMEN - 1 VIEW COMPARISON:  06/23/2023 FINDINGS: Interval removal of enteric tube. Interval decrease in volume of enteric contrast material from the colon. However, there is been increase caliber of the small bowel loops within the left hemiabdomen which measure up to 3.3 cm on today's exam. No supine evidence for pneumoperitoneum. Bulging contour of the right lower quadrant of the abdomen is noted which most likely corresponds to large cystic mass noted in the right hemiabdomen along with recurrent abdominopelvic ascites. IMPRESSION: 1. Interval removal of enteric tube. 2. Increase caliber of small bowel loops within the left hemiabdomen which measure up to 3.3 cm on today's exam. Findings are concerning for recurrent obstruction. 3. Bulging contour of the right lower quadrant of the abdomen is noted which most likely corresponds to large cystic mass noted in the right hemiabdomen along with increasing abdominopelvic ascites. Electronically Signed   By: Kimberley Penman M.D.   On: 06/26/2023 04:15    Scheduled Meds:  benztropine   0.5 mg Oral QHS   buPROPion   150 mg Oral Daily   diazepam   5 mg Oral QHS   enoxaparin  (LOVENOX ) injection  60 mg Subcutaneous BID   fentaNYL   1 patch Transdermal Q72H   memantine   5 mg Oral BID   metoprolol  tartrate  12.5 mg Oral BID    mirtazapine   15 mg Oral QHS   octreotide  100 mcg Subcutaneous TID   pantoprazole  (PROTONIX ) IV  40 mg Intravenous Q24H   Continuous Infusions:  dextrose  5% lactated ringers  75 mL/hr at 06/26/23 1355     LOS: 4 days   Time spent: 50 minutes  Unk Garb, DO  [facility]  06/26/2023, 4:40 PM

## 2023-06-26 NOTE — Plan of Care (Signed)
 Pt alert and oriented x3. Skin warm and dry. Abd distended. No c/o pain at this time. Pt experiencing urinary urgency. Assistance provided to the bathroom. Blood glucose monitored. IVF's infusing. New iv placed after pt pulled the previous site out. Encouraged to call for assistance as needed. Call light in reach. Sr x3 elevated. Bed in low position. Bed alarm activated.

## 2023-06-26 NOTE — Progress Notes (Signed)
 This chaplain received an update from PMT NP-Shae before the F/U Pt. visit for Advance Directive.   This chaplain understands from the update the Pt. is unable to complete an Advance Directive at this time. This chaplain is availble to revisit as needed.  Chaplain Kathleene Papas [phone]

## 2023-06-26 NOTE — Progress Notes (Addendum)
 Provider made the tele-sitter order.  Will continue to monitor

## 2023-06-27 DIAGNOSIS — G893 Neoplasm related pain (acute) (chronic): Secondary | ICD-10-CM | POA: Diagnosis not present

## 2023-06-27 DIAGNOSIS — Z515 Encounter for palliative care: Secondary | ICD-10-CM

## 2023-06-27 DIAGNOSIS — R180 Malignant ascites: Secondary | ICD-10-CM | POA: Diagnosis not present

## 2023-06-27 DIAGNOSIS — K56600 Partial intestinal obstruction, unspecified as to cause: Secondary | ICD-10-CM | POA: Diagnosis not present

## 2023-06-27 DIAGNOSIS — C786 Secondary malignant neoplasm of retroperitoneum and peritoneum: Secondary | ICD-10-CM | POA: Diagnosis not present

## 2023-06-27 LAB — ANAEROBIC CULTURE W GRAM STAIN: Gram Stain: NONE SEEN

## 2023-06-27 LAB — CULTURE, BLOOD (ROUTINE X 2)
Culture: NO GROWTH
Culture: NO GROWTH

## 2023-06-27 LAB — GLUCOSE, CAPILLARY
Glucose-Capillary: 122 mg/dL — ABNORMAL HIGH (ref 70–99)
Glucose-Capillary: 136 mg/dL — ABNORMAL HIGH (ref 70–99)
Glucose-Capillary: 136 mg/dL — ABNORMAL HIGH (ref 70–99)

## 2023-06-27 MED ORDER — ONDANSETRON HCL 4 MG/2ML IJ SOLN
4.0000 mg | Freq: Three times a day (TID) | INTRAMUSCULAR | Status: DC
  Administered 2023-06-27 – 2023-06-28 (×4): 4 mg via INTRAVENOUS
  Filled 2023-06-27 (×4): qty 2

## 2023-06-27 MED ORDER — DEXTROSE IN LACTATED RINGERS 5 % IV SOLN: INTRAVENOUS | Status: DC

## 2023-06-27 NOTE — Progress Notes (Signed)
 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 She was admitted 6.2.2025 with diagnosis of patrial small bowel obstruction. [facility] was notified by her daughter of calling EMS for patient due to vomiting. Per Dr. Tessie Fila, hospice physician, this is a related hospital admission.    Patient on sips and chips during the night and denied any episodes of n/v. Daughter at bedside. States she cancelled trip to [place] to be here with patient. She is continuing to refuse to have NG tube placed at this time. Daughter would like comfort care approach and would like to transition her mother to [facility]. At this time, no bed availability, but patient and daughter informed that we will continue to assess daily.   She currently remains inpatient appropriate with need for higher level monitoring related to recent exacerbation of symptoms.    Vital Signs: 98.0/72/18    154/96   spO2 97% room air   I&O: 593/300   Abnormal labs: None new   Diagnostics:  None new     IV/PRN Meds: Zofran  4mg  IV x1, Protonix  40mg  IV Q24hr, Dextrose  5% in LR @75ml /hr continuous    Problem List per progress note Dr. Unk Garb 6.7.25 Assessment and Plan: * Small bowel obstruction, partial (HCC) 06-26-2023 I think pt is intermittently obstructing. Likely due to tumor invasion or adhesions to her bowels.  KUB from today shows recurrent SBO. She had episode of vomiting last night. Pt refused NG tube. Dtr does not want it replaced.   06-27-2023 no further vomiting. Pt tolerated sips/ice chips. Dtr wants pt to try clear liquids. Discussed intermittent bowel obstruction and N/V.  Will add scheduled IV zofran  to prevent any N/V when starting clear liquids. Continue with prn IV fentanyl  25 mcg for pain.   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  I explained to the daughter in very basic terms that the patient has incurable cancer.  She is not a candidate for any sort of chemotherapy.  I do not think hormonal therapy will help either.  I made the analogy of a spit ball against the moving train to help her understand hormonal therapy versus her malignant cancer at this point.  She seemed to understand this analogy.  After further discussion, [name] states that she fears the most that she will not be present when her mother passes.  Her daughter has a trip planned to [place] to help her husband deal with her sick father-in-law.  I discussed that placing the patient into residential hospice may give her the respite she needs in order to take care of the rest of her family while hospice care cares for her mother.  Discussed that there are no other options available to treat the patient's bowel obstructions other than NG tube which she does not want the patient to have.  Discussed that a feeding tube would not relieve the bowel obstruction.  I think the patient can probably tolerate some clear liquids but I doubt she can tolerate anything more than protein shakes.  After some back-and-forth, the daughter [name] has agreed to place the patient into residential hospice care and move forward with comfort care measures.  I have informed GYN oncology and also palliative care.  Hospice liaison will also be contacted.  Hopefully the patient can be discharged to residential hospice this weekend.  Continue with IV fentanyl  for continued pain.    06-27-2023 dtr understands that there is no curative chemo for patient. Dtr understands that pt's ovarian cancer will continue to progress and ultimately end pt's life. Per gyn/onc pt has a mere days to weeks to live given the advanced cancer she has.    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  I explained to the daughter in very basic terms that the patient has incurable cancer.  She is not a candidate for any sort of chemotherapy.  I do not think hormonal therapy will help either.  I made the analogy of a spit ball against the moving train to help her understand hormonal therapy versus her malignant cancer at this point.  She seemed to understand this analogy.  After further discussion, [name] states that she fears the most that she will not be present when her mother passes.  Her daughter has a trip planned to [place] to help her husband deal with her sick father-in-law.  I discussed that placing the patient into residential hospice may give her the respite she needs in order to take care of the rest of her family while hospice care cares for her mother.  Discussed that there are no other options available to treat the patient's bowel obstructions other than NG tube which she does not want the patient to have.  Discussed that a feeding tube would not relieve the bowel obstruction.  I think the patient can probably tolerate some clear liquids but I doubt she can tolerate anything more than protein shakes.  After some back-and-forth, the daughter [name] has agreed to place the patient into residential hospice care and move forward with comfort care measures.  I have informed GYN oncology and also palliative care.  Hospice liaison will also be contacted.  Hopefully the patient can be discharged to residential hospice this weekend.  Continue with IV fentanyl  for continued pain.   06-27-2023 dtr understands that there is no curative chemo for patient. Dtr understands that pt's ovarian cancer will continue to progress and ultimately end pt's life. Per gyn/onc pt has a mere days to weeks to live given the advanced cancer she has.   Dehydration 06-26-2023 continue with IVF for now.   06-27-2023  continue with IVF for now. Allow pt to have clear liquids for now.   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   06-27-2023 chronic and metastatic.   Essential hypertension 06-26-2023 stop HTN meds. Pt is now comfort care.   06-27-2023 chronic. Remains on lopressor  for now to prevent any tachycardia from beta-blocker withdrawal. Hold parameters placed.   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   06-27-2023 chronic.   Terminal care 06-27-2023 pt made comfort care yesterday. No more labs draws. Awaiting hospice home evaluation.   Discharge Planning: Evaluation BP again on tomorrow  Family contact: talked with daughter at bedside   IDT:  Updated   Goals of Care-  DNR   Please don't hesitate to reach out for any hospice related questions or concerns  Jacqlyn Matas, BSN, [facility] Liaison [phone]

## 2023-06-27 NOTE — Progress Notes (Signed)
 Daughter called just after 4am. Asked nurse about patients current diet and update. Nurse read MD order for diet and that she is continuing on fluids. Vitals are good. Pt currently is asleep. Daughter then asked what the room phone number was at is no longer end in 10. Nurse stated that the current one does end in 10, that she was in room 18 before. Daughter said no she was in room 10 previously. Nurse assured daughter about current number. Nurse went into room to answer phone when daughter called, call went through.

## 2023-06-27 NOTE — Plan of Care (Signed)

## 2023-06-27 NOTE — Progress Notes (Addendum)
 PROGRESS NOTE    Kristen Colon  WUJ:[id number] DOB: [birth year] DOA: 06/22/2023 PCP: Patient, No Pcp Per  Subjective: Pt seen and examined. Met with pt's dtr linda at bedside. Pt sitting in bedside recliner.  Dtr [name] states she has canceled her upcoming trip to [place].  Awaiting for hospice to evaluate patient.  Pt tolerated sips/ice chips. Dtr wants pt to try clear liquids. Discussed intermittent bowel obstruction and N/V.  Met with pt's friend Royston Cornea and his dtr Chelsea at bedside.   Hospital Course: The patient is a chronically ill-appearing 73 year old female with a past medical history significant for paranoid schizophrenia, dementia, history of DVT on anticoagulant with apixaban , hypertension, history of metastatic ovarian cancer as diagnosed in 2016 who is currently on home hospice.  She was brought to the hospital given intermittent episodes of nausea vomiting with no bowel movement in about 6 days.    Subsequently she is found to have a bowel obstruction and General Surgery was consulted.  IR was consulted for paracentesis.  NG tube was placed for her partial small bowel obstruction and connected to low intermittent suction.  Per general surgery she is not a surgical candidate and most would recommend IR placement of venting G-tube if she does not improve.    Continues to have output and had 300 mL out of her NG in the canister this morning but abdomen remains distended and she is asking for diet. KUB showed a non-obstructive bowel gas pattern so will initiate small bowel protocol to see if NG can be removed. GYN oncology evaluation to see if they have any other recommendations and they will see the patient in the AM.   6/4: Repeat KUB with no obvious obstruction, clamping NG tube and starting on clear liquid diet.  Daughter does not want any venting G-tube or Pleurx catheter at this time.  She was requesting to see an oncologist. Oncology was already consulted-awaiting formal  consult note  6/5: Pulled NG tube earlier today, diet advanced to full liquid.  Patient did not had any more bowel movement after 6/3.  Worsening abdominal distention but does not want paracentesis at this time. Oncology is recommending outpatient follow-up.  They strongly recommend G-tube placement for venting as she will remain high risk for recurrent ileus/SBO due to cancer burden-daughter is not ready yet.  Assessment and Plan:  Intractable nausea and vomiting secondary to partial small bowel obstruction.  Patient also has massive ascites.  CT scan also suggest some stool in the rectal vault.  Will volume replete with IV fluids.  Correct underlying electrolyte abnormalities including hypokalemia.  Trial of smog enema will be offered.  NGT Suction. Repeat KUB in the AM. Surgery Consulted for further evaluation. Will have patient undergo SBO protocol. If fails conservative measures is not a candidate for surgical intervention and may likely need a Palliative venting g tube based on GOC Discussions.  - Patient pulled out NG tube this morning.  Currently tolerating full liquid diet but did not had any more bowel movement after 6/3. - Will remain high risk for recurrent ileus and SBO due to cancer burden and oncology is recommending G-tube placement-daughter is not ready yet.  Will need replacing NG tube if develop nausea and vomiting.   Massive ascites likely secondary to metastatic ovarian cancer and peritoneal carcinomatosis: Previously been on letrozole  and tamoxifen in 2019 and 2021 respectively.  Patient has been receiving periodic abdominal paracentesis.  S/p IR guided abdominal paracentesis for comfort.  Hold anticoagulation  in anticipation for procedure.  Anticoagulation w/ Heparin  gtt post-procedure. There has been prior discussion about having her obtain a Pleurx due to recurrent ascites requiring paracentesis but will defer to IR Daughter does not want Pleurx catheter at this time - Worsening  abdominal distention but patient does not want paracentesis today.   Acute kidney injury most likely due to dehydration: Dehydration caused by intractable nausea and vomiting.  Patient was volume repleted with "Ringer's"  lactate at 60 mL/hr x1 day but will renew  and change to D5 /LR + 20 mEQ for another 12 hours @ 75 mL/hr. BUN/Cr went from 18/1.24 -> 11/0.84. Avoid Nephrotoxic Medications, Contrast Dyes, Hypotension and Dehydration to Ensure Adequate Renal Perfusion and will need to Renally Adjust Meds. CTM and Trend Renal Function carefully and repeat CMP in the AM    Complex cystic pelvic mass: Compatible with metastatic ovarian cancer. Patient daughter wishes to discuss options regarding treatment with oncology. 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  Patient is on chronic anticoagulation with apixaban .  Will hold anticoagulation for now due to pending abdominal paracentesis procedure and because of pSBO. Start Heparin  gtt after Paracentesis and continue until SBO improves and tolerating diet  Hypoglycemia: Improved.  Continue to monitor  Normocytic Anemia/Anemia of Malignancy: Hgb/Hct is now 10.8/33.7. Check Anemia Panel in the AM. CTM for S/Sx of Bleeding as patient is on Heparin  gtt for [facility]. No overt bleeding noted. Repeat CBC in the AM    Hypokalemia: Resolved today  History of paranoid schizophrenia, bipolar disorder and cognitive impairment: Patient is on Invega  injections monthly.  Next dose on 06/21.  IV lorazepam  for anxiety.  Able to take p.o. safely continue with mirtazapine  50 mg p.o. nightly and risperidone  0.5 mg p.o. twice daily as needed for bipolar   Essential Hypertension: Will hold home regimen (ordered on MAR though) for now due to n.p.o. status.  IV hydralazine  as needed. CTM BP per protocol.   GERD/GI prophylaxis: With IV PPI with pantoprazole  40 mg every  24  Hypoalbuminemia: Albumin  at 2.5 today  HPI: Kristen Colon is a 73 y.o. female with medical history significant of paranoid schizophrenia, dementia, DVT on anticoagulation with apixaban , hypertension, metastatic ovarian cancer that was diagnosed in 2016.  Patient is on hospice at home.  She is cared for at home by her daughter.  She received periodic abdominal paracentesis.  She has been in her usual state of health daughter noted worsening abdominal distention over the past several days.  For the past 4 days, patient has been having intermittent episodes of nausea and vomiting.  Today she has been unable to keep any food down and has vomited all day.  Vomitus was described as brownish/black.  Despite treatment with promethazine , symptoms do persist hence daughter decided to bring patient to the emergency room to be further evaluated.  No associated complaints of abdominal pain fever or chills.  Per daughter, patient has not had any bowel movements over the past 4 days.   Patient is a poor historian hence most of the history was obtained from daughter.   In the ED, CT scan was obtained and did reveal evidence of partial small bowel obstruction.  There is also evidence of worsening ascites and ovarian mass.  Significant Events: Admitted 06/22/2023 for partial SBO 06-24-2023 SBO resolved 06-26-2023 SBO has returned on abd xr. Pt refused NG tube placement  Admission Labs: Lipase 22  Na 138, K 3.3, CO2 of 30, BUN 18, Scr 1.24, glu 153 WBC 10, HgB 12.4, Plt 350  Admission Imaging Studies: Acute abd series Mildly dilated loops of small bowel in the left mid abdomen, nonspecific, small bowel obstruction not excluded. 2. Increased interstitial opacities with patchy bibasilar opacities, some of which may be chronic, but superimposed mild infection/pneumonia is not excluded. CT abd/pelvis The small bowel loops within the left hemiabdomen are dilated with air-fluid levels measuring up to 3.8 cm.  Transition to decreased caliber distal small bowel noted within the left hemiabdomen. Findings are compatible with partial small bowel obstruction. 2. Mild increase in moderate volume abdominopelvic ascites. 3. Stable appearance of complex cystic masses within the pelvis and right abdomen compatible with history of ovarian cancer. 4. Stable appearance of peritoneal nodules compatible with peritoneal carcinomatosis. 5. Cholelithiasis. 6. Chronic occlusion of the left common iliac artery. 7.  Aortic Atherosclerosis  Significant Labs:   Significant Imaging Studies: 06-24-2023 abd XR Decompressed stomach with gastric tube in place. 2. Nonobstructive bowel gas pattern 06-26-2023 Interval removal of enteric tube. 2. Increase caliber of small bowel loops within the left hemiabdomen which measure up to 3.3 cm on today's exam. Findings are concerning for recurrent obstruction. 3. Bulging contour of the right lower quadrant of the abdomen is noted which most likely corresponds to large cystic mass noted in the right hemiabdomen along with increasing abdominopelvic ascites.  Antibiotic Therapy: Anti-infectives (From admission, onward)    Start     Dose/Rate Route Frequency Ordered Stop   06/22/23 0430  piperacillin -tazobactam (ZOSYN ) IVPB 3.375 g        3.375 g 100 mL/hr over 30 Minutes Intravenous  Once 06/22/23 0417 06/22/23 0554       Procedures: 06-22-2023 paracentesis for 3.3 liters  Consultants: General surgery Palliative care Gyn/Onc    Assessment and Plan: * Small bowel obstruction, partial (HCC) 06-26-2023 I think pt is intermittently obstructing. Likely due to tumor invasion or adhesions to her bowels.  KUB from today shows recurrent SBO. She had episode of vomiting last night. Pt refused NG tube. Dtr does not want it replaced.  06-27-2023 no further vomiting. Pt tolerated sips/ice chips. Dtr wants pt to try clear liquids. Discussed intermittent bowel obstruction and N/V.  Will add  scheduled IV zofran  to prevent any N/V when starting clear liquids. Continue with prn IV fentanyl  25 mcg for pain.  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  06-27-2023 continue with prn IV fentanyl   Peritoneal carcinomatosis (HCC) 06-26-2023 I had a long 60 min conversation with pt's dtr [name] outside of pt's room.  I explained to the daughter in very basic terms that the patient has incurable cancer.  She is not a candidate for any sort of chemotherapy.  I do not think hormonal therapy will help either.  I made the analogy of a spit ball against the moving train to help her understand hormonal therapy versus her malignant cancer at this point.  She seemed to understand this analogy.  After further discussion, [name] states that she fears the most that she will not be present when her mother passes.  Her daughter has a trip planned to [place] to help her husband deal with her sick father-in-law.  I discussed that placing the patient into residential hospice may give her the respite she needs in order to take care of the rest of her family while hospice care cares for her mother.  Discussed  that there are no other options available to treat the patient's bowel obstructions other than NG tube which she does not want the patient to have.  Discussed that a feeding tube would not relieve the bowel obstruction.  I think the patient can probably tolerate some clear liquids but I doubt she can tolerate anything more than protein shakes.  After some back-and-forth, the daughter [name] has agreed to place the patient into residential hospice care and move forward with comfort care measures.  I have informed GYN oncology and also palliative care.  Hospice liaison will also be contacted.  Hopefully the patient can be discharged to residential hospice this weekend.  Continue with IV fentanyl  for continued pain.   06-27-2023 dtr understands that there is no curative chemo for  patient. Dtr understands that pt's ovarian cancer will continue to progress and ultimately end pt's life. Per gyn/onc pt has a mere days to weeks to live given the advanced cancer she has.  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  I explained to the daughter in very basic terms that the patient has incurable cancer.  She is not a candidate for any sort of chemotherapy.  I do not think hormonal therapy will help either.  I made the analogy of a spit ball against the moving train to help her understand hormonal therapy versus her malignant cancer at this point.  She seemed to understand this analogy.  After further discussion, [name] states that she fears the most that she will not be present when her mother passes.  Her daughter has a trip planned to [place] to help her husband deal with her sick father-in-law.  I discussed that placing the patient into residential hospice may give her the respite she needs in order to take care of the rest of her family while hospice care cares for her mother.  Discussed that there are no other options available to treat the patient's bowel obstructions other than NG tube which she does not want the patient to have.  Discussed that a feeding tube would not relieve the bowel obstruction.  I think the patient can probably tolerate some clear liquids but I doubt she can tolerate anything more than protein shakes.  After some back-and-forth, the daughter [name] has agreed to place the patient into residential hospice care and move forward with comfort care measures.  I have informed GYN oncology and also palliative care.  Hospice liaison will also be contacted.  Hopefully the patient can be discharged to residential hospice this weekend.  Continue with IV fentanyl  for continued pain.  06-27-2023 dtr understands that there is no curative chemo for patient. Dtr understands that pt's ovarian cancer will continue to  progress and ultimately end pt's life. Per gyn/onc pt has a mere days to weeks to live given the advanced cancer she has.  Dehydration 06-26-2023 continue with IVF for now.  06-27-2023 continue with IVF for now. Allow pt to have clear liquids for now.  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  06-27-2023 chronic and metastatic.  Essential hypertension 06-26-2023 stop HTN meds. Pt is now comfort care.  06-27-2023 chronic. Remains on lopressor  for now to prevent any tachycardia from beta-blocker withdrawal. Hold parameters placed.  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  06-27-2023 chronic.  Terminal care 06-27-2023 pt made comfort care yesterday. No more labs draws. Awaiting hospice home evaluation.   DVT prophylaxis: SCDs Start: 06/22/23  1610    Code Status: Do not attempt resuscitation (DNR) - Comfort care Family Communication: discussed at bedside with pt's dtr [name] Disposition Plan: hospice home vs home Reason for continuing need for hospitalization: remains on IVF. Awaiting hospice evaluation.  Objective: Vitals:   06/26/23 1936 06/26/23 2108 06/27/23 0300 06/27/23 0900  BP: (!) 123/103 (!) 123/103 (!) 154/96 122/71  Pulse: (!) 52 64 72 (!) 54  Resp: 16  18 18   Temp: 98.5 F (36.9 C)  98 F (36.7 C)   TempSrc: Oral  Oral   SpO2: 98%   97%  Weight:      Height:        Intake/Output Summary (Last 24 hours) at 06/27/2023 1505 Last data filed at 06/27/2023 0618 Gross per 24 hour  Intake 1250.28 ml  Output --  Net 1250.28 ml   Filed Weights   06/22/23 0258  Weight: 62.6 kg    Examination:  Physical Exam Vitals and nursing note reviewed.  Constitutional:      Comments: awake  HENT:     Head: Normocephalic and atraumatic.  Cardiovascular:     Rate and Rhythm: Normal rate.  Pulmonary:     Effort: Pulmonary effort is normal. No respiratory distress.     Breath sounds: No wheezing.  Abdominal:     General:  There is distension.     Comments: Hard mass in her abd. Quiet BS but present  Skin:    General: Skin is warm and dry.  Neurological:     Mental Status: She is disoriented.     Comments: Pleasantly confused.     Data Reviewed: I have personally reviewed following labs and imaging studies  CBC: Recent Labs  Lab 06/22/23 0305 06/23/23 0600 06/24/23 0759  WBC 10.0 6.5 7.1  NEUTROABS  --  3.6 4.1  HGB 12.4 10.8* 11.9*  HCT 38.5 33.7* 38.0  MCV 88.3 90.3 91.6  PLT 350 284 306   Basic Metabolic Panel: Recent Labs  Lab 06/22/23 0305 06/23/23 0600 06/24/23 0759 06/26/23 0715  NA 138 141 142 139  K 3.3* 3.2* 3.8 4.0  CL 93* 105 107 107  CO2 30 28 23 24   GLUCOSE 153* 60* 106* 82  BUN 18 11 8  <5*  CREATININE 1.24* 0.84 0.82 0.74  CALCIUM 9.2 8.0* 8.0* 8.2*  MG  --  2.1 2.0  --   PHOS  --  3.4 2.2* 2.8   GFR: Estimated Creatinine Clearance: 54.5 mL/min (by C-G formula based on SCr of 0.74 mg/dL). Liver Function Tests: Recent Labs  Lab 06/22/23 0305 06/23/23 0600 06/24/23 0759  AST 24 16 17   ALT 18 15 14   ALKPHOS 68 51 52  BILITOT 0.7 0.7 0.6  PROT 6.7 5.3* 5.7*  ALBUMIN  3.0* 2.3* 2.5*   Recent Labs  Lab 06/22/23 0305  LIPASE 22    Coagulation Profile: Recent Labs  Lab 06/22/23 0418  INR 1.3*   CBG: Recent Labs  Lab 06/25/23 2351 06/26/23 0353 06/26/23 0757 06/26/23 1145 06/27/23 1205  GLUCAP 91 142* 80 112* 122*   Sepsis Labs: Recent Labs  Lab 06/22/23 0427  LATICACIDVEN 1.3    Recent Results (from the past 240 hours)  Blood Culture (routine x 2)     Status: None   Collection Time: 06/22/23  4:18 AM   Specimen: BLOOD  Result Value Ref Range Status   Specimen Description BLOOD RIGHT ANTECUBITAL  Final   Special Requests   Final    BOTTLES DRAWN  AEROBIC AND ANAEROBIC Blood Culture results may not be optimal due to an inadequate volume of blood received in culture bottles   Culture   Final    NO GROWTH 5 DAYS Performed at [facility], [address], [place], [place] [address]    Report Status 06/27/2023 FINAL  Final  Blood Culture (routine x 2)     Status: None   Collection Time: 06/22/23  4:18 AM   Specimen: BLOOD  Result Value Ref Range Status   Specimen Description BLOOD LEFT ANTECUBITAL  Final   Special Requests   Final    BOTTLES DRAWN AEROBIC AND ANAEROBIC Blood Culture results may not be optimal due to an inadequate volume of blood received in culture bottles   Culture   Final    NO GROWTH 5 DAYS Performed at [facility], [address], [place], [place] [address]    Report Status 06/27/2023 FINAL  Final  Urine Culture     Status: Abnormal   Collection Time: 06/22/23  7:29 AM   Specimen: Urine, Random  Result Value Ref Range Status   Specimen Description URINE, RANDOM  Final   Special Requests NONE Reflexed from [id number]  Final   Culture (A)  Final    <10,000 COLONIES/mL INSIGNIFICANT GROWTH Performed at [facility], [address], [place], [place] [address]    Report Status 06/23/2023 FINAL  Final  Anaerobic culture w Gram Stain     Status: None (Preliminary result)   Collection Time: 06/22/23  4:26 PM   Specimen: Abdomen  Result Value Ref Range Status   Specimen Description ABDOMEN PERITONEAL  Final   Special Requests NONE  Final   Gram Stain   Final    NO WBC SEEN NO ORGANISMS SEEN Performed at [facility], [address] [place], [place] [address]    Culture   Final    NO ANAEROBES ISOLATED; CULTURE IN PROGRESS FOR 5 DAYS   Report Status PENDING  Incomplete  Body fluid culture w Gram Stain     Status: None   Collection Time: 06/22/23  4:36 PM   Specimen: Abdomen; Peritoneal Fluid  Result Value Ref Range Status   Specimen Description ABDOMEN PLEURAL  Final   Special Requests NONE  Final   Gram Stain NO WBC SEEN NO ORGANISMS SEEN   Final   Culture   Final    NO GROWTH 3 DAYS Performed at [facility], [address], [place], [place] [address]     Report Status 06/25/2023 FINAL  Final     Radiology Studies: DG Abd 1 View Result Date: 06/26/2023 CLINICAL DATA:  Vomiting. EXAM: ABDOMEN - 1 VIEW COMPARISON:  06/23/2023 FINDINGS: Interval removal of enteric tube. Interval decrease in volume of enteric contrast material from the colon. However, there is been increase caliber of the small bowel loops within the left hemiabdomen which measure up to 3.3 cm on today's exam. No supine evidence for pneumoperitoneum. Bulging contour of the right lower quadrant of the abdomen is noted which most likely corresponds to large cystic mass noted in the right hemiabdomen along with recurrent abdominopelvic ascites. IMPRESSION: 1. Interval removal of enteric tube. 2. Increase caliber of small bowel loops within the left hemiabdomen which measure up to 3.3 cm on today's exam. Findings are concerning for recurrent obstruction. 3. Bulging contour of the right lower quadrant of the abdomen is noted which most likely corresponds to large cystic mass noted  in the right hemiabdomen along with increasing abdominopelvic ascites. Electronically Signed   By: Kimberley Penman M.D.   On: 06/26/2023 04:15    Scheduled Meds:  benztropine   0.5 mg Oral QHS   buPROPion   150 mg Oral Daily   diazepam   5 mg Oral QHS   fentaNYL   1 patch Transdermal Q72H   memantine   5 mg Oral BID   metoprolol  tartrate  12.5 mg Oral BID   mirtazapine   15 mg Oral QHS   octreotide   100 mcg Subcutaneous TID   ondansetron  (ZOFRAN ) IV  4 mg Intravenous Q8H   pantoprazole  (PROTONIX ) IV  40 mg Intravenous Q24H   Continuous Infusions:  dextrose  5% lactated ringers        LOS: 5 days   Time spent: 50 minutes  Unk Garb, DO  [facility]  06/27/2023, 3:05 PM

## 2023-06-27 NOTE — Assessment & Plan Note (Signed)
 06-27-2023 pt made comfort care yesterday. No more labs draws. Awaiting hospice home evaluation.  06-28-2023 awaiting bed at [facility]  06-29-2023 Decision made yesterday to discharge pt to home as she does not qualify for residential [facility]. Pt remains DNR/DNI/comfort care but will be going home with hospice.

## 2023-06-27 NOTE — Plan of Care (Signed)

## 2023-06-28 DIAGNOSIS — G893 Neoplasm related pain (acute) (chronic): Secondary | ICD-10-CM | POA: Diagnosis not present

## 2023-06-28 DIAGNOSIS — K56600 Partial intestinal obstruction, unspecified as to cause: Secondary | ICD-10-CM | POA: Diagnosis not present

## 2023-06-28 DIAGNOSIS — R180 Malignant ascites: Secondary | ICD-10-CM | POA: Diagnosis not present

## 2023-06-28 DIAGNOSIS — Z515 Encounter for palliative care: Secondary | ICD-10-CM | POA: Diagnosis not present

## 2023-06-28 MED ORDER — ONDANSETRON 4 MG PO TBDP
4.0000 mg | ORAL_TABLET | Freq: Three times a day (TID) | ORAL | Status: DC
  Administered 2023-06-29: 4 mg via ORAL
  Filled 2023-06-28: qty 1

## 2023-06-28 MED ORDER — PANTOPRAZOLE SODIUM 40 MG PO TBEC
40.0000 mg | DELAYED_RELEASE_TABLET | Freq: Two times a day (BID) | ORAL | Status: DC
  Administered 2023-06-28 – 2023-06-29 (×2): 40 mg via ORAL
  Filled 2023-06-28 (×2): qty 1

## 2023-06-28 MED ORDER — FENTANYL 100 MCG/HR TD PT72
1.0000 | MEDICATED_PATCH | TRANSDERMAL | Status: DC
  Administered 2023-06-28: 1 via TRANSDERMAL
  Filled 2023-06-28: qty 1

## 2023-06-28 MED ORDER — ENSURE PLUS HIGH PROTEIN PO LIQD
237.0000 mL | Freq: Three times a day (TID) | ORAL | Status: DC
  Administered 2023-06-28 – 2023-06-29 (×2): 237 mL via ORAL

## 2023-06-28 NOTE — Progress Notes (Addendum)
 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 Admitted in the setting of nausea and vomiting - identified to have an SBO. Has been decompressed with NGT and some symptomatic improvement. The PMT team has been asked to support additional conversations moving forward regarding goals of care. Patient has been established with [facility] home hospice who are also in agreement with the inpatient team seeing the patient   Today's Discussion 06/28/2023  *Please note that this is a verbal dictation therefore any spelling or grammatical errors are due to the "[employer]agon [employer] One" system interpretation.  Chart reviewed inclusive of vital signs, progress notes, laboratory results, and diagnostic images. (+) 1 additional dose of fentanyl  in the last 24 hours. NGT has been pulled by patient. (+) liquid BM 6/6 - query impaction related.   Gyn-Onc spoke to patients daughter, [name] at length on 6/6 and have asserted patients poor prognosis.   Patient made full comfort care 6/7 - awaiting inpatient placement with [facility]  I met with patients RN, Shelvy Dickens this morning who shares that the patient herself has been in no distress.   I met with [place] at bedside - created space and opportunity for patient to explore thoughts feelings and fears regarding current medical situation. ________________________________________  Addendum:  I met with Dr. Farrel Hones this evening to speak to patients daughter, [name].   Arielle was sitting up in the recliner in good spirits.  Discussions related to patients liberalization to full bariatric diet and clearance to go home were have.  Plan for possible discharge tomorrow if patient remains stable.  Questions and concerns addressed/Palliative  Support Provided.   Add time: 25  Objective Assessment: Vital Signs Vitals:   06/27/23 1636 06/27/23 1952  BP: (!) 148/84 (!) 156/67  Pulse: (!) 53 (!) 59  Resp: 18 16  Temp: 98 F (36.7 C) 98.2 F (36.8 C)  SpO2: 100% 95%    Intake/Output Summary (Last 24 hours) at 06/28/2023 0854 Last data filed at 06/27/2023 2006 Gross per 24 hour  Intake 799.08 ml  Output --  Net 799.08 ml   Last Weight  Most recent update: 06/22/2023  2:59 AM    Weight  62.6 kg (138 lb 0.1 oz)            Gen:  Elderly AA F chronically ill appearing HEENT: Dry mucous membranes CV: Regular rate and rhythm  PULM: On RA, breathing is even and nonlabored   ABD: Distended painful with deep palpation EXT: No edema  Neuro: Alert to self  SUMMARY OF RECOMMENDATIONS   DNAR/DNI  Comfort focused care   Partial SBO d/t metastatic ovarian CA: - Self Dc'd NGT 6/5 - Octreotide  100 mcg Cornelius TID - Continue bisacodyl  10mg  suppository Daily PRN   Ascites in the setting of metastatic disease: - S/P IR guided paracentesis on 6/2 (-) 3.3L - Continue paracentesis as needed - daughter not on board with pleurex   Pain in the setting of metastatic disease: - Continue Fentanyl  75mcg patch - Continue fentanyl  12.5-25mcg Q3H PRN   Anxiety/Depression: - Continue wellbutrin   - Continue valium  at bedtime (has been on > 6 years) - Continue mirtazepine   Polypharmacy: -Have stopped PRN baclofen , atarax , risperidone   Placement: - Awaiting a [facility] bed - presently there are none available ______________________________________________________________________________________ [facility]  Medicine Team Team Cell Phone: [phone] Please utilize secure chat with additional questions, if there is no response within 30 minutes please call the above phone number  Time Spent: 50 Billing based on MDM: MDM  Palliative Medicine Team providers are available by phone from 7am to 7pm daily  and can be reached through the team cell phone.  Should this patient require assistance outside of these hours, please call the patient's attending physician.

## 2023-06-28 NOTE — Progress Notes (Signed)
 [place] [id number] - [facility] Patient Visit   Kristen Colon is a current hospice patient, with hospice diagnosis of malignant neoplasm of Left ovary. She was admitted 6.2.2025 with diagnosis of patrial small bowel obstruction. [facility] was notified by her daughter of calling EMS for patient due to vomiting. Per Dr. Tessie Fila, hospice physician, this is a related hospital admission.    Attempted to visit patient at bedside, however patient was in shower with daughter providing assistance. Spoke with bedside nurse who verbalized that patient had been doing well and denied any instances of n/v at this time. Pain managed with no requests today for medication. Patient has tolerated clear liquid diet as well. Liaison called and spoke with daughter regarding discharge disposition. Discussed that patient not appropriate for [facility] at this time, however we will assess daily for changes or decline. Daughter verbalized frustration about hearing this and stated that she no longer wanted to talk. Call was then disconnected. Liasion notified hospital team of discussion.     She currently remains inpatient appropriate with need for higher level monitoring related to recent exacerbation of symptoms and assessment/monitoring as diet is progressed.    Vital Signs:  None noted   I&O: 5591/1700   Abnormal labs: None new   Diagnostics:  None new     IV/PRN Meds: Zofran  4mg  IV x3, Protonix  40mg  IV Q24hr, Dextrose  5% in LR @75ml /hr continuous, Fentanyl  25mcg IV x1   Problem List per progress note Dr. Unk Garb 6.8.25 Assessment and Plan: Assessment and Plan: * Small bowel obstruction, partial (HCC) 06-26-2023 I think pt is intermittently obstructing. Likely due to tumor invasion or adhesions to her bowels.  KUB from today shows recurrent SBO. She had episode of vomiting last night. Pt refused NG tube. Dtr does not want it replaced.   06-27-2023 no further vomiting.  Pt tolerated sips/ice chips. Dtr wants pt to try clear liquids. Discussed intermittent bowel obstruction and N/V.  Will add scheduled IV zofran  to prevent any N/V when starting clear liquids. Continue with prn IV fentanyl  25 mcg for pain.   06-28-2023 continue with clear liquids. Only taking 800 ml/day thus far. Continue with scheduled IV zofran . Continue with IV fentanyl  prn for pain control   Chronic pain due to neoplasm 06-26-2023 increase fentanyl  to 25 mcg IV prn.   06-27-2023 continue with prn IV fentanyl    06-28-2023 continue prn IV fentanyl . Used dose last night.   Peritoneal carcinomatosis (HCC) 06-26-2023 I had a long 60 min conversation with pt's dtr [name] outside of pt's room.  I explained to the daughter in very basic terms that the patient has incurable cancer.  She is not a candidate for any sort of chemotherapy.  I do not think hormonal therapy will help either.  I made the analogy of a spit ball against the moving train to help her understand hormonal therapy versus her malignant cancer at this point.  She seemed to understand this analogy.  After further discussion, [name] states that she fears the most that she will not be present when her mother passes.  Her daughter has a trip planned to [place] to help her husband deal with her sick father-in-law.  I discussed that placing the patient into residential hospice may give her the respite she needs in order to take care of the rest of her family while hospice care cares for her mother.  Discussed that there are no other options available to treat the patient's bowel  obstructions other than NG tube which she does not want the patient to have.  Discussed that a feeding tube would not relieve the bowel obstruction.  I think the patient can probably tolerate some clear liquids but I doubt she can tolerate anything more than protein shakes.  After some back-and-forth, the daughter [name] has agreed to place the patient into residential  hospice care and move forward with comfort care measures.  I have informed GYN oncology and also palliative care.  Hospice liaison will also be contacted.  Hopefully the patient can be discharged to residential hospice this weekend.  Continue with IV fentanyl  for continued pain.    06-27-2023 dtr understands that there is no curative chemo for patient. Dtr understands that pt's ovarian cancer will continue to progress and ultimately end pt's life. Per gyn/onc pt has a mere days to weeks to live given the advanced cancer she has.   06-28-2023 awaiting bed at [facility]   Malignant ascites 06-26-2023 I had a long 60 min conversation with pt's dtr [name] outside of pt's room.  I explained to the daughter in very basic terms that the patient has incurable cancer.  She is not a candidate for any sort of chemotherapy.  I do not think hormonal therapy will help either.  I made the analogy of a spit ball against the moving train to help her understand hormonal therapy versus her malignant cancer at this point.  She seemed to understand this analogy.  After further discussion, [name] states that she fears the most that she will not be present when her mother passes.  Her daughter has a trip planned to [place] to help her husband deal with her sick father-in-law.  I discussed that placing the patient into residential hospice may give her the respite she needs in order to take care of the rest of her family while hospice care cares for her mother.  Discussed that there are no other options available to treat the patient's bowel obstructions other than NG tube which she does not want the patient to have.  Discussed that a feeding tube would not relieve the bowel obstruction.  I think the patient can probably tolerate some clear liquids but I doubt she can tolerate anything more than protein shakes.  After some back-and-forth, the daughter [name] has agreed to place the patient into residential hospice care and move  forward with comfort care measures.  I have informed GYN oncology and also palliative care.  Hospice liaison will also be contacted.  Hopefully the patient can be discharged to residential hospice this weekend.  Continue with IV fentanyl  for continued pain.   06-27-2023 dtr understands that there is no curative chemo for patient. Dtr understands that pt's ovarian cancer will continue to progress and ultimately end pt's life. Per gyn/onc pt has a mere days to weeks to live given the advanced cancer she has.   06-28-2023 awaiting bed at [facility]   Dehydration 06-26-2023 continue with IVF for now.   06-27-2023 continue with IVF for now. Allow pt to have clear liquids for now.   06-28-2023 continue with clear liquids. Given her intermittent nature of her bowel obstruction due to tumor location and probably tumor involvement of intestines, I doubt she will be able to move past full liquids. I think any solid food would like cause a bowel obstruction.   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   06-27-2023 chronic and metastatic.   06-28-2023 chronic.  Essential hypertension 06-26-2023 stop HTN meds. Pt is now comfort care.   06-27-2023 chronic. Remains on lopressor  for now to prevent any tachycardia from beta-blocker withdrawal. Hold parameters placed.   06-28-2023 chronic.   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   06-27-2023 chronic.   06-28-2023 chronic.   Terminal care 06-27-2023 pt made comfort care yesterday. No more labs draws. Awaiting hospice home evaluation.   06-28-2023 awaiting bed at [facility]   Discharge Planning: Discharge home with hospice tomorrow   Family contact: talked with daughter via phone    IDT:  Updated    Goals of Care-  DNR   Please don't hesitate to reach out for any hospice related questions or concerns   Jacqlyn Matas, BSN, [facility] Liaison [phone]

## 2023-06-28 NOTE — Plan of Care (Signed)

## 2023-06-28 NOTE — Progress Notes (Signed)
 PROGRESS NOTE    Kristen Colon  WUJ:[id number] DOB: 1950-04-03 DOA: 06/22/2023 PCP: Patient, No Pcp Per  Subjective: Pt seen and examined. Pt's dtr not at bedside Pt sleeping comfortably. Only took in 800 ml in fluids yesterday. Still receiving IV fentanyl  for pain Remains on IV zofran  scheduled to prevent nausea.   Hospital Course: The patient is a chronically ill-appearing 73 year old female with a past medical history significant for paranoid schizophrenia, dementia, history of DVT on anticoagulant with apixaban , hypertension, history of metastatic ovarian cancer as diagnosed in 2016 who is currently on home hospice.  She was brought to the hospital given intermittent episodes of nausea vomiting with no bowel movement in about 6 days.    Subsequently she is found to have a bowel obstruction and General Surgery was consulted.  IR was consulted for paracentesis.  NG tube was placed for her partial small bowel obstruction and connected to low intermittent suction.  Per general surgery she is not a surgical candidate and most would recommend IR placement of venting G-tube if she does not improve.    Continues to have output and had 300 mL out of her NG in the canister this morning but abdomen remains distended and she is asking for diet. KUB showed a non-obstructive bowel gas pattern so will initiate small bowel protocol to see if NG can be removed. GYN oncology evaluation to see if they have any other recommendations and they will see the patient in the AM.   6/4: Repeat KUB with no obvious obstruction, clamping NG tube and starting on clear liquid diet.  Daughter does not want any venting G-tube or Pleurx catheter at this time.  She was requesting to see an oncologist. Oncology was already consulted-awaiting formal consult note  6/5: Pulled NG tube earlier today, diet advanced to full liquid.  Patient did not had any more bowel movement after 6/3.  Worsening abdominal distention but does not  want paracentesis at this time. Oncology is recommending outpatient follow-up.  They strongly recommend G-tube placement for venting as she will remain high risk for recurrent ileus/SBO due to cancer burden-daughter is not ready yet.  Assessment and Plan:  Intractable nausea and vomiting secondary to partial small bowel obstruction.  Patient also has massive ascites.  CT scan also suggest some stool in the rectal vault.  Will volume replete with IV fluids.  Correct underlying electrolyte abnormalities including hypokalemia.  Trial of smog enema will be offered.  NGT Suction. Repeat KUB in the AM. Surgery Consulted for further evaluation. Will have patient undergo SBO protocol. If fails conservative measures is not a candidate for surgical intervention and may likely need a Palliative venting g tube based on GOC Discussions.  - Patient pulled out NG tube this morning.  Currently tolerating full liquid diet but did not had any more bowel movement after 6/3. - Will remain high risk for recurrent ileus and SBO due to cancer burden and oncology is recommending G-tube placement-daughter is not ready yet.  Will need replacing NG tube if develop nausea and vomiting.   Massive ascites likely secondary to metastatic ovarian cancer and peritoneal carcinomatosis: Previously been on letrozole  and tamoxifen in 2019 and 2021 respectively.  Patient has been receiving periodic abdominal paracentesis.  S/p IR guided abdominal paracentesis for comfort.  Hold anticoagulation in anticipation for procedure.  Anticoagulation w/ Heparin  gtt post-procedure. There has been prior discussion about having her obtain a Pleurx due to recurrent ascites requiring paracentesis but will defer to IR  Daughter does not want Pleurx catheter at this time - Worsening abdominal distention but patient does not want paracentesis today.   Acute kidney injury most likely due to dehydration: Dehydration caused by intractable nausea and vomiting.   Patient was volume repleted with "Ringer's"  lactate at 60 mL/hr x1 day but will renew  and change to D5 /LR + 20 mEQ for another 12 hours @ 75 mL/hr. BUN/Cr went from 18/1.24 -> 11/0.84. Avoid Nephrotoxic Medications, Contrast Dyes, Hypotension and Dehydration to Ensure Adequate Renal Perfusion and will need to Renally Adjust Meds. CTM and Trend Renal Function carefully and repeat CMP in the AM    Complex cystic pelvic mass: Compatible with metastatic ovarian cancer. Patient daughter wishes to discuss options regarding treatment with oncology. 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  Patient is on chronic anticoagulation with apixaban .  Will hold anticoagulation for now due to pending abdominal paracentesis procedure and because of pSBO. Start Heparin  gtt after Paracentesis and continue until SBO improves and tolerating diet  Hypoglycemia: Improved.  Continue to monitor  Normocytic Anemia/Anemia of Malignancy: Hgb/Hct is now 10.8/33.7. Check Anemia Panel in the AM. CTM for S/Sx of Bleeding as patient is on Heparin  gtt for [facility]. No overt bleeding noted. Repeat CBC in the AM    Hypokalemia: Resolved today  History of paranoid schizophrenia, bipolar disorder and cognitive impairment: Patient is on Invega  injections monthly.  Next dose on 06/21.  IV lorazepam  for anxiety.  Able to take p.o. safely continue with mirtazapine  50 mg p.o. nightly and risperidone  0.5 mg p.o. twice daily as needed for bipolar   Essential Hypertension: Will hold home regimen (ordered on MAR though) for now due to n.p.o. status.  IV hydralazine  as needed. CTM BP per protocol.   GERD/GI prophylaxis: With IV PPI with pantoprazole  40 mg every 24  Hypoalbuminemia: Albumin  at 2.5 today  HPI: Kristen Colon is a 73 y.o. female with medical history significant of paranoid schizophrenia, dementia, DVT on anticoagulation with  apixaban , hypertension, metastatic ovarian cancer that was diagnosed in 2016.  Patient is on hospice at home.  She is cared for at home by her daughter.  She received periodic abdominal paracentesis.  She has been in her usual state of health daughter noted worsening abdominal distention over the past several days.  For the past 4 days, patient has been having intermittent episodes of nausea and vomiting.  Today she has been unable to keep any food down and has vomited all day.  Vomitus was described as brownish/black.  Despite treatment with promethazine , symptoms do persist hence daughter decided to bring patient to the emergency room to be further evaluated.  No associated complaints of abdominal pain fever or chills.  Per daughter, patient has not had any bowel movements over the past 4 days.   Patient is a poor historian hence most of the history was obtained from daughter.   In the ED, CT scan was obtained and did reveal evidence of partial small bowel obstruction.  There is also evidence of worsening ascites and ovarian mass.  Significant Events: Admitted 06/22/2023 for partial SBO 06-24-2023 SBO resolved 06-26-2023 SBO has returned on abd xr. Pt refused NG tube placement  Admission Labs: Lipase 22 Na 138, K 3.3, CO2 of 30, BUN 18, Scr 1.24, glu 153 WBC 10, HgB 12.4, Plt 350  Admission Imaging Studies: Acute abd series Mildly dilated loops of small bowel  in the left mid abdomen, nonspecific, small bowel obstruction not excluded. 2. Increased interstitial opacities with patchy bibasilar opacities, some of which may be chronic, but superimposed mild infection/pneumonia is not excluded. CT abd/pelvis The small bowel loops within the left hemiabdomen are dilated with air-fluid levels measuring up to 3.8 cm. Transition to decreased caliber distal small bowel noted within the left hemiabdomen. Findings are compatible with partial small bowel obstruction. 2. Mild increase in moderate volume  abdominopelvic ascites. 3. Stable appearance of complex cystic masses within the pelvis and right abdomen compatible with history of ovarian cancer. 4. Stable appearance of peritoneal nodules compatible with peritoneal carcinomatosis. 5. Cholelithiasis. 6. Chronic occlusion of the left common iliac artery. 7.  Aortic Atherosclerosis  Significant Labs:   Significant Imaging Studies: 06-24-2023 abd XR Decompressed stomach with gastric tube in place. 2. Nonobstructive bowel gas pattern 06-26-2023 Interval removal of enteric tube. 2. Increase caliber of small bowel loops within the left hemiabdomen which measure up to 3.3 cm on today's exam. Findings are concerning for recurrent obstruction. 3. Bulging contour of the right lower quadrant of the abdomen is noted which most likely corresponds to large cystic mass noted in the right hemiabdomen along with increasing abdominopelvic ascites.  Antibiotic Therapy: Anti-infectives (From admission, onward)    Start     Dose/Rate Route Frequency Ordered Stop   06/22/23 0430  piperacillin -tazobactam (ZOSYN ) IVPB 3.375 g        3.375 g 100 mL/hr over 30 Minutes Intravenous  Once 06/22/23 0417 06/22/23 0554       Procedures: 06-22-2023 paracentesis for 3.3 liters  Consultants: General surgery Palliative care Gyn/Onc    Assessment and Plan: * Small bowel obstruction, partial (HCC) 06-26-2023 I think pt is intermittently obstructing. Likely due to tumor invasion or adhesions to her bowels.  KUB from today shows recurrent SBO. She had episode of vomiting last night. Pt refused NG tube. Dtr does not want it replaced.  06-27-2023 no further vomiting. Pt tolerated sips/ice chips. Dtr wants pt to try clear liquids. Discussed intermittent bowel obstruction and N/V.  Will add scheduled IV zofran  to prevent any N/V when starting clear liquids. Continue with prn IV fentanyl  25 mcg for pain.  06-28-2023 continue with clear liquids. Only taking 800 ml/day thus  far. Continue with scheduled IV zofran . Continue with IV fentanyl  prn for pain control  Chronic pain due to neoplasm 06-26-2023 increase fentanyl  to 25 mcg IV prn.  06-27-2023 continue with prn IV fentanyl   06-28-2023 continue prn IV fentanyl . Used dose last night.  Peritoneal carcinomatosis (HCC) 06-26-2023 I had a long 60 min conversation with pt's dtr [name] outside of pt's room.  I explained to the daughter in very basic terms that the patient has incurable cancer.  She is not a candidate for any sort of chemotherapy.  I do not think hormonal therapy will help either.  I made the analogy of a spit ball against the moving train to help her understand hormonal therapy versus her malignant cancer at this point.  She seemed to understand this analogy.  After further discussion, [name] states that she fears the most that she will not be present when her mother passes.  Her daughter has a trip planned to [place] to help her husband deal with her sick father-in-law.  I discussed that placing the patient into residential hospice may give her the respite she needs in order to take care of the rest of her family while hospice care cares for her  mother.  Discussed that there are no other options available to treat the patient's bowel obstructions other than NG tube which she does not want the patient to have.  Discussed that a feeding tube would not relieve the bowel obstruction.  I think the patient can probably tolerate some clear liquids but I doubt she can tolerate anything more than protein shakes.  After some back-and-forth, the daughter [name] has agreed to place the patient into residential hospice care and move forward with comfort care measures.  I have informed GYN oncology and also palliative care.  Hospice liaison will also be contacted.  Hopefully the patient can be discharged to residential hospice this weekend.  Continue with IV fentanyl  for continued pain.   06-27-2023 dtr understands that  there is no curative chemo for patient. Dtr understands that pt's ovarian cancer will continue to progress and ultimately end pt's life. Per gyn/onc pt has a mere days to weeks to live given the advanced cancer she has.  06-28-2023 awaiting bed at [facility]  Malignant ascites 06-26-2023 I had a long 60 min conversation with pt's dtr [name] outside of pt's room.  I explained to the daughter in very basic terms that the patient has incurable cancer.  She is not a candidate for any sort of chemotherapy.  I do not think hormonal therapy will help either.  I made the analogy of a spit ball against the moving train to help her understand hormonal therapy versus her malignant cancer at this point.  She seemed to understand this analogy.  After further discussion, [name] states that she fears the most that she will not be present when her mother passes.  Her daughter has a trip planned to [place] to help her husband deal with her sick father-in-law.  I discussed that placing the patient into residential hospice may give her the respite she needs in order to take care of the rest of her family while hospice care cares for her mother.  Discussed that there are no other options available to treat the patient's bowel obstructions other than NG tube which she does not want the patient to have.  Discussed that a feeding tube would not relieve the bowel obstruction.  I think the patient can probably tolerate some clear liquids but I doubt she can tolerate anything more than protein shakes.  After some back-and-forth, the daughter [name] has agreed to place the patient into residential hospice care and move forward with comfort care measures.  I have informed GYN oncology and also palliative care.  Hospice liaison will also be contacted.  Hopefully the patient can be discharged to residential hospice this weekend.  Continue with IV fentanyl  for continued pain.  06-27-2023 dtr understands that there is no curative chemo  for patient. Dtr understands that pt's ovarian cancer will continue to progress and ultimately end pt's life. Per gyn/onc pt has a mere days to weeks to live given the advanced cancer she has.  06-28-2023 awaiting bed at [facility]  Dehydration 06-26-2023 continue with IVF for now.  06-27-2023 continue with IVF for now. Allow pt to have clear liquids for now.  06-28-2023 continue with clear liquids. Given her intermittent nature of her bowel obstruction due to tumor location and probably tumor involvement of intestines, I doubt she will be able to move past full liquids. I think any solid food would like cause a bowel obstruction.  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  06-27-2023 chronic and metastatic.  06-28-2023 chronic.  Essential hypertension 06-26-2023 stop HTN meds. Pt is now comfort care.  06-27-2023 chronic. Remains on lopressor  for now to prevent any tachycardia from beta-blocker withdrawal. Hold parameters placed.  06-28-2023 chronic.  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  06-27-2023 chronic.  06-28-2023 chronic.  Terminal care 06-27-2023 pt made comfort care yesterday. No more labs draws. Awaiting hospice home evaluation.  06-28-2023 awaiting bed at [facility]   DVT prophylaxis: SCDs Start: 06/22/23 1610    Code Status: Do not attempt resuscitation (DNR) - Comfort care Family Communication: spoke with pt's dtr linda yesterday at bedside Disposition Plan: hospice Reason for continuing need for hospitalization: medically ready for transfer to hospice home.  Objective: Vitals:   06/27/23 0900 06/27/23 1636 06/27/23 1952 06/28/23 1034  BP: 122/71 (!) 148/84 (!) 156/67   Pulse: (!) 54 (!) 53 (!) 59 (!) 58  Resp: 18 18 16    Temp:  98 F (36.7 C) 98.2 F (36.8 C)   TempSrc:  Oral Oral   SpO2: 97% 100% 95%   Weight:      Height:        Intake/Output Summary (Last 24 hours) at 06/28/2023 1331 Last  data filed at 06/27/2023 2006 Gross per 24 hour  Intake 799.08 ml  Output --  Net 799.08 ml   Filed Weights   06/22/23 0258  Weight: 62.6 kg    Examination:  Physical Exam Vitals and nursing note reviewed.  Constitutional:      Comments: Sleeping peacefully I did not awaken her  Cardiovascular:     Rate and Rhythm: Normal rate.  Pulmonary:     Effort: Pulmonary effort is normal.  Abdominal:     General: There is distension.     Comments: Hard mass on right side of abd  Neurological:     Comments: sleeping     Data Reviewed: I have personally reviewed following labs and imaging studies  CBC: Recent Labs  Lab 06/22/23 0305 06/23/23 0600 06/24/23 0759  WBC 10.0 6.5 7.1  NEUTROABS  --  3.6 4.1  HGB 12.4 10.8* 11.9*  HCT 38.5 33.7* 38.0  MCV 88.3 90.3 91.6  PLT 350 284 306   Basic Metabolic Panel: Recent Labs  Lab 06/22/23 0305 06/23/23 0600 06/24/23 0759 06/26/23 0715  NA 138 141 142 139  K 3.3* 3.2* 3.8 4.0  CL 93* 105 107 107  CO2 30 28 23 24   GLUCOSE 153* 60* 106* 82  BUN 18 11 8  <5*  CREATININE 1.24* 0.84 0.82 0.74  CALCIUM 9.2 8.0* 8.0* 8.2*  MG  --  2.1 2.0  --   PHOS  --  3.4 2.2* 2.8   GFR: Estimated Creatinine Clearance: 54.5 mL/min (by C-G formula based on SCr of 0.74 mg/dL). Liver Function Tests: Recent Labs  Lab 06/22/23 0305 06/23/23 0600 06/24/23 0759  AST 24 16 17   ALT 18 15 14   ALKPHOS 68 51 52  BILITOT 0.7 0.7 0.6  PROT 6.7 5.3* 5.7*  ALBUMIN  3.0* 2.3* 2.5*   Recent Labs  Lab 06/22/23 0305  LIPASE 22    Coagulation Profile: Recent Labs  Lab 06/22/23 0418  INR 1.3*   CBG: Recent Labs  Lab 06/26/23 0757 06/26/23 1145 06/27/23 1205 06/27/23 1635 06/27/23 2133  GLUCAP 80 112* 122* 136* 136*   Sepsis Labs: Recent Labs  Lab 06/22/23 0427  LATICACIDVEN 1.3    Recent Results (from the past 240 hours)  Blood Culture (routine x 2)     Status:  None   Collection Time: 06/22/23  4:18 AM   Specimen: BLOOD   Result Value Ref Range Status   Specimen Description BLOOD RIGHT ANTECUBITAL  Final   Special Requests   Final    BOTTLES DRAWN AEROBIC AND ANAEROBIC Blood Culture results may not be optimal due to an inadequate volume of blood received in culture bottles   Culture   Final    NO GROWTH 5 DAYS Performed at [facility], [address], [place], [place] [address]    Report Status 06/27/2023 FINAL  Final  Blood Culture (routine x 2)     Status: None   Collection Time: 06/22/23  4:18 AM   Specimen: BLOOD  Result Value Ref Range Status   Specimen Description BLOOD LEFT ANTECUBITAL  Final   Special Requests   Final    BOTTLES DRAWN AEROBIC AND ANAEROBIC Blood Culture results may not be optimal due to an inadequate volume of blood received in culture bottles   Culture   Final    NO GROWTH 5 DAYS Performed at [facility], [address] [place], [place] [address]    Report Status 06/27/2023 FINAL  Final  Urine Culture     Status: Abnormal   Collection Time: 06/22/23  7:29 AM   Specimen: Urine, Random  Result Value Ref Range Status   Specimen Description URINE, RANDOM  Final   Special Requests NONE Reflexed from [id number]  Final   Culture (A)  Final    <10,000 COLONIES/mL INSIGNIFICANT GROWTH Performed at [facility], [address] [place], [place] [address]    Report Status 06/23/2023 FINAL  Final  Anaerobic culture w Gram Stain     Status: None   Collection Time: 06/22/23  4:26 PM   Specimen: Abdomen  Result Value Ref Range Status   Specimen Description ABDOMEN PERITONEAL  Final   Special Requests NONE  Final   Gram Stain NO WBC SEEN NO ORGANISMS SEEN   Final   Culture   Final    NO ANAEROBES ISOLATED Performed at [facility], [address] [place], [place] [address]    Report Status 06/27/2023 FINAL  Final  Body fluid culture w Gram Stain     Status: None   Collection Time: 06/22/23  4:36 PM   Specimen: Abdomen; Peritoneal Fluid  Result Value  Ref Range Status   Specimen Description ABDOMEN PLEURAL  Final   Special Requests NONE  Final   Gram Stain NO WBC SEEN NO ORGANISMS SEEN   Final   Culture   Final    NO GROWTH 3 DAYS Performed at [facility], [address] [place], [place] [address]    Report Status 06/25/2023 FINAL  Final     Radiology Studies: No results found.  Scheduled Meds:  benztropine   0.5 mg Oral QHS   buPROPion   150 mg Oral Daily   diazepam   5 mg Oral QHS   fentaNYL   1 patch Transdermal Q72H   memantine   5 mg Oral BID   metoprolol  tartrate  12.5 mg Oral BID   mirtazapine   15 mg Oral QHS   octreotide   100 mcg Subcutaneous TID   ondansetron  (ZOFRAN ) IV  4 mg Intravenous Q8H   pantoprazole  (PROTONIX ) IV  40 mg Intravenous Q24H   Continuous Infusions:  dextrose  5% lactated ringers  75 mL/hr at 06/28/23 0627     LOS: 6 days   Time spent: 50 minutes  Unk Garb,  DO  [facility]  06/28/2023, 1:31 PM

## 2023-06-28 NOTE — Plan of Care (Signed)
   Problem: Education: Goal: Knowledge of General Education information will improve Description Including pain rating scale, medication(s)/side effects and non-pharmacologic comfort measures Outcome: Progressing

## 2023-06-28 NOTE — Progress Notes (Signed)
   Asked to come back and speak with pt's dtr [name]. Joining me was Moira Andrews with palliative care. Discussed that pt did not meet inpatient criteria for [facility] place. Dtr wants to take pt home with hospice again. Will convert her over to po protonix , SL zofran . Stop IVF.  Increase fentanyl  patch to 100 mcg/hr. Plan for DC to home tomorrow.  Unk Garb, DO [facility]

## 2023-06-29 ENCOUNTER — Other Ambulatory Visit (HOSPITAL_COMMUNITY): Payer: Self-pay

## 2023-06-29 DIAGNOSIS — K56600 Partial intestinal obstruction, unspecified as to cause: Secondary | ICD-10-CM | POA: Diagnosis not present

## 2023-06-29 DIAGNOSIS — R180 Malignant ascites: Secondary | ICD-10-CM

## 2023-06-29 DIAGNOSIS — E860 Dehydration: Secondary | ICD-10-CM

## 2023-06-29 DIAGNOSIS — C786 Secondary malignant neoplasm of retroperitoneum and peritoneum: Secondary | ICD-10-CM

## 2023-06-29 DIAGNOSIS — I10 Essential (primary) hypertension: Secondary | ICD-10-CM

## 2023-06-29 DIAGNOSIS — Z515 Encounter for palliative care: Secondary | ICD-10-CM | POA: Diagnosis not present

## 2023-06-29 DIAGNOSIS — G893 Neoplasm related pain (acute) (chronic): Secondary | ICD-10-CM | POA: Diagnosis not present

## 2023-06-29 DIAGNOSIS — F203 Undifferentiated schizophrenia: Secondary | ICD-10-CM

## 2023-06-29 MED ORDER — FENTANYL 100 MCG/HR TD PT72: 1.0000 | MEDICATED_PATCH | TRANSDERMAL | 0 refills | Status: AC

## 2023-06-29 MED ORDER — MIRTAZAPINE 15 MG PO TABS
15.0000 mg | ORAL_TABLET | Freq: Every day | ORAL | 0 refills | Status: AC
Start: 2023-06-29 — End: 2023-09-27
  Filled 2023-06-29: qty 40, 40d supply, fill #0

## 2023-06-29 MED ORDER — DIAZEPAM 5 MG PO TABS
5.0000 mg | ORAL_TABLET | Freq: Every day | ORAL | 0 refills | Status: DC
  Filled 2023-06-29: qty 5, 5d supply, fill #0

## 2023-06-29 MED ORDER — FENTANYL 100 MCG/HR TD PT72
1.0000 | MEDICATED_PATCH | TRANSDERMAL | Status: DC
  Administered 2023-06-29: 1 via TRANSDERMAL
  Filled 2023-06-29: qty 1

## 2023-06-29 MED ORDER — FENTANYL 100 MCG/HR TD PT72
1.0000 | MEDICATED_PATCH | TRANSDERMAL | 0 refills | Status: DC
  Filled 2023-06-29: qty 2, 6d supply, fill #0

## 2023-06-29 MED ORDER — PANTOPRAZOLE SODIUM 40 MG PO TBEC
40.0000 mg | DELAYED_RELEASE_TABLET | Freq: Two times a day (BID) | ORAL | 0 refills | Status: DC
  Filled 2023-06-29: qty 180, 90d supply, fill #0

## 2023-06-29 MED ORDER — BENZTROPINE MESYLATE 0.5 MG PO TABS
0.5000 mg | ORAL_TABLET | Freq: Every day | ORAL | 0 refills | Status: DC
  Filled 2023-06-29: qty 90, 90d supply, fill #0

## 2023-06-29 MED ORDER — BUPROPION HCL ER (XL) 150 MG PO TB24
150.0000 mg | ORAL_TABLET | Freq: Every day | ORAL | 0 refills | Status: DC
  Filled 2023-06-29: qty 90, 90d supply, fill #0

## 2023-06-29 MED ORDER — MEMANTINE HCL 5 MG PO TABS
5.0000 mg | ORAL_TABLET | Freq: Two times a day (BID) | ORAL | 0 refills | Status: DC
  Filled 2023-06-29: qty 180, 90d supply, fill #0

## 2023-06-29 MED ORDER — ENSURE ACTIVE HIGH PROTEIN PO LIQD: 1.0000 | Freq: Three times a day (TID) | ORAL | 0 refills | Status: AC

## 2023-06-29 MED ORDER — ONDANSETRON 4 MG PO TBDP
4.0000 mg | ORAL_TABLET | Freq: Three times a day (TID) | ORAL | 0 refills | Status: AC
  Filled 2023-06-29: qty 90, 30d supply, fill #0

## 2023-06-29 MED ORDER — METOPROLOL TARTRATE 25 MG PO TABS
12.5000 mg | ORAL_TABLET | Freq: Two times a day (BID) | ORAL | 0 refills | Status: DC
  Filled 2023-06-29: qty 90, 90d supply, fill #0

## 2023-06-29 NOTE — Progress Notes (Signed)
 DISCHARGE NOTE HOME DANYALE RIDINGER to be discharged Home per MD order. Discussed prescriptions and follow up appointments with the patient. Prescriptions given to patient; medication list explained in detail. Patient verbalized understanding.  Skin clean, dry and intact without evidence of skin break down, no evidence of skin tears noted. IV catheter discontinued intact. Site without signs and symptoms of complications. Dressing and pressure applied. Pt denies pain at the site currently. No complaints noted.  Patient free of lines, drains, and wounds.   An After Visit Summary (AVS) was printed and given to the patient. Patient escorted via wheelchair, and discharged home via private auto.  Naketa Daddario K Elizeth Weinrich, RN

## 2023-06-29 NOTE — Progress Notes (Addendum)
   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 Admitted in the setting of nausea and vomiting - identified to have an SBO. Has been decompressed with NGT and some symptomatic improvement. The PMT team has been asked to support additional conversations moving forward regarding goals of care. Patient has been established with [facility] home [facility] who are also in agreement with the inpatient team seeing the patient   Today's Discussion 06/29/2023  *Please note that this is a verbal dictation therefore any spelling or grammatical errors are due to the "[employer]" system interpretation.  Chart reviewed inclusive of vital signs, progress notes, laboratory results, and diagnostic images.   I met with Verley's RN this morning who shares the plan for discharge.  I met with Tiyonna at bedside she states her pain is under better control and denies nausea. Kingsley Penny asked when her breakfast would be delivered. I deferred to nursing for this questions.   We reviewed the plan for discharge this morning to home which she is in agreement with.    Questions and concerns addressed/Palliative Support Provided.   Objective Assessment: Vital Signs Vitals:   06/28/23 2100 06/29/23 0747  BP: (!) 151/68 130/66  Pulse:  (!) 53  Resp: 17 16  Temp: 97.6 F (36.4 C) 98.5 F (36.9 C)  SpO2: 97% 99%    Intake/Output Summary (Last 24 hours) at 06/29/2023 1154 Last data filed at 06/28/2023 2300 Gross per 24 hour  Intake 237 ml  Output --  Net 237 ml   Last Weight  Most recent update: 06/22/2023  2:59 AM    Weight  62.6 kg (138 lb 0.1 oz)            Gen:  Elderly AA F chronically ill appearing HEENT: Dry mucous membranes CV: Regular rate and rhythm  PULM: On RA,  breathing is even and nonlabored   ABD: Distended painful with deep palpation EXT: No edema  Neuro: Alert to self  SUMMARY OF RECOMMENDATIONS   DNAR/DNI  Comfort focused care   Plan for discharge home today with [facility] ______________________________________________________________________________________ Camille [facility] [place] Palliative Medicine Team Team Cell Phone: [phone] Please utilize secure chat with additional questions, if there is no response within 30 minutes please call the above phone number  Time Spent: 25  Palliative Medicine Team providers are available by phone from 7am to 7pm daily and can be reached through the team cell phone.  Should this patient require assistance outside of these hours, please call the patient's attending physician.

## 2023-06-29 NOTE — Progress Notes (Addendum)
 Nutrition Brief Note   RD received a consult for diet education regarding full liquids diet after discharge due to recurrent SBO. Pt set to discharge home today with hospice. Pt noted to be oriented to person only per chart. Met with pt daughter prior to discharge. RD reviewed "Full Liquid Nutrition therapy" handout from the [employer] with daughter and placed in discharge instructions for family.   No additional nutrition interventions warranted at this time. Please re-consult or message "RD Inpatient" group if needed.    Doneta Furbish RD, LDN Clinical Dietitian

## 2023-06-29 NOTE — Progress Notes (Signed)
 Spoke to daughter Elwin Hammond to let her know that KristenColon would be discharged today. She was upset regarding discharge stating she had just got home from work and would be here to pick her up at noon. The nurse caring for the patient informed her that she would complete the discharge and the patient could await her arrival in the discharge lounge area. This option upset her and she began to yell and become upset with that progression in her care. She was not interested in listening to what this area had to offer or how her mother would be cared for during her wait. In the end she stated she would be on her way and that this was not an overall good visit. Primary nurse updated.

## 2023-06-29 NOTE — Discharge Summary (Signed)
 [facility] Physician Discharge Summary   Patient name: Kristen Colon  Admit date:     06/22/2023  Discharge date: 06/29/2023  Attending Physician: Theodora Fish [[id number]]  Discharge Physician: Unk Garb   PCP: Patient, No Pcp Per  Admitted From: Home  Disposition:  Home with hospice care  Recommendations for Outpatient Follow-up:  Follow up with PCP in 1-2 weeks Follow up with GYN/Oncology  Home Health:home with hospice care Equipment/Devices: None    Discharge Condition:Hospice CODE STATUS:Comfort Care Diet recommendation: full bariatric liquid diet Fluid Restriction: None  Hospital Summary: The patient is a chronically ill-appearing 73 year old female with a past medical history significant for paranoid schizophrenia, dementia, history of DVT on anticoagulant with apixaban , hypertension, history of metastatic ovarian cancer as diagnosed in 2016 who is currently on home hospice.  She was brought to the hospital given intermittent episodes of nausea vomiting with no bowel movement in about 6 days.    Subsequently she is found to have a bowel obstruction and General Surgery was consulted.  IR was consulted for paracentesis.  NG tube was placed for her partial small bowel obstruction and connected to low intermittent suction.  Per general surgery she is not a surgical candidate and most would recommend IR placement of venting G-tube if she does not improve.    Continues to have output and had 300 mL out of her NG in the canister this morning but abdomen remains distended and she is asking for diet. KUB showed a non-obstructive bowel gas pattern so will initiate small bowel protocol to see if NG can be removed. GYN oncology evaluation to see if they have any other recommendations and they will see the patient in the AM.   6/4: Repeat KUB with no obvious obstruction, clamping NG tube and starting on clear liquid diet.  Daughter does not want any venting G-tube or Pleurx  catheter at this time.  She was requesting to see an oncologist. Oncology was already consulted-awaiting formal consult note  6/5: Pulled NG tube earlier today, diet advanced to full liquid.  Patient did not had any more bowel movement after 6/3.  Worsening abdominal distention but does not want paracentesis at this time. Oncology is recommending outpatient follow-up.  They strongly recommend G-tube placement for venting as she will remain high risk for recurrent ileus/SBO due to cancer burden-daughter is not ready yet.  Assessment and Plan:  Intractable nausea and vomiting secondary to partial small bowel obstruction.  Patient also has massive ascites.  CT scan also suggest some stool in the rectal vault.  Will volume replete with IV fluids.  Correct underlying electrolyte abnormalities including hypokalemia.  Trial of smog enema will be offered.  NGT Suction. Repeat KUB in the AM. Surgery Consulted for further evaluation. Will have patient undergo SBO protocol. If fails conservative measures is not a candidate for surgical intervention and may likely need a Palliative venting g tube based on GOC Discussions.  - Patient pulled out NG tube this morning.  Currently tolerating full liquid diet but did not had any more bowel movement after 6/3. - Will remain high risk for recurrent ileus and SBO due to cancer burden and oncology is recommending G-tube placement-daughter is not ready yet.  Will need replacing NG tube if develop nausea and vomiting.   Massive ascites likely secondary to metastatic ovarian cancer and peritoneal carcinomatosis: Previously been on letrozole  and tamoxifen in 2019 and 2021 respectively.  Patient has been receiving periodic abdominal paracentesis.  S/p IR  guided abdominal paracentesis for comfort.  Hold anticoagulation in anticipation for procedure.  Anticoagulation w/ Heparin  gtt post-procedure. There has been prior discussion about having her obtain a Pleurx due to recurrent  ascites requiring paracentesis but will defer to IR Daughter does not want Pleurx catheter at this time - Worsening abdominal distention but patient does not want paracentesis today.   Acute kidney injury most likely due to dehydration: Dehydration caused by intractable nausea and vomiting.  Patient was volume repleted with "Ringer's"  lactate at 60 mL/hr x1 day but will renew  and change to D5 /LR + 20 mEQ for another 12 hours @ 75 mL/hr. BUN/Cr went from 18/1.24 -> 11/0.84. Avoid Nephrotoxic Medications, Contrast Dyes, Hypotension and Dehydration to Ensure Adequate Renal Perfusion and will need to Renally Adjust Meds. CTM and Trend Renal Function carefully and repeat CMP in the AM    Complex cystic pelvic mass: Compatible with metastatic ovarian cancer. Patient daughter wishes to discuss options regarding treatment with oncology. 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  Patient is on chronic anticoagulation with apixaban .  Will hold anticoagulation for now due to pending abdominal paracentesis procedure and because of pSBO. Start Heparin  gtt after Paracentesis and continue until SBO improves and tolerating diet  Hypoglycemia: Improved.  Continue to monitor  Normocytic Anemia/Anemia of Malignancy: Hgb/Hct is now 10.8/33.7. Check Anemia Panel in the AM. CTM for S/Sx of Bleeding as patient is on Heparin  gtt for [facility]. No overt bleeding noted. Repeat CBC in the AM    Hypokalemia: Resolved today  History of paranoid schizophrenia, bipolar disorder and cognitive impairment: Patient is on Invega  injections monthly.  Next dose on 06/21.  IV lorazepam  for anxiety.  Able to take p.o. safely continue with mirtazapine  50 mg p.o. nightly and risperidone  0.5 mg p.o. twice daily as needed for bipolar   Essential Hypertension: Will hold home regimen (ordered on MAR though) for now due to n.p.o. status.  IV  hydralazine  as needed. CTM BP per protocol.   GERD/GI prophylaxis: With IV PPI with pantoprazole  40 mg every 24  Hypoalbuminemia: Albumin  at 2.5 today  HPI: IDAMAY HOSEIN is a 73 y.o. female with medical history significant of paranoid schizophrenia, dementia, DVT on anticoagulation with apixaban , hypertension, metastatic ovarian cancer that was diagnosed in 2016.  Patient is on hospice at home.  She is cared for at home by her daughter.  She received periodic abdominal paracentesis.  She has been in her usual state of health daughter noted worsening abdominal distention over the past several days.  For the past 4 days, patient has been having intermittent episodes of nausea and vomiting.  Today she has been unable to keep any food down and has vomited all day.  Vomitus was described as brownish/black.  Despite treatment with promethazine , symptoms do persist hence daughter decided to bring patient to the emergency room to be further evaluated.  No associated complaints of abdominal pain fever or chills.  Per daughter, patient has not had any bowel movements over the past 4 days.   Patient is a poor historian hence most of the history was obtained from daughter.   In the ED, CT scan was obtained and did reveal evidence of partial small bowel obstruction.  There is also evidence of worsening ascites and ovarian mass.  Significant Events: Admitted 06/22/2023 for partial SBO 06-24-2023 SBO resolved 06-26-2023 SBO has returned on abd xr. Pt refused  NG tube placement  Admission Labs: Lipase 22 Na 138, K 3.3, CO2 of 30, BUN 18, Scr 1.24, glu 153 WBC 10, HgB 12.4, Plt 350  Admission Imaging Studies: Acute abd series Mildly dilated loops of small bowel in the left mid abdomen, nonspecific, small bowel obstruction not excluded. 2. Increased interstitial opacities with patchy bibasilar opacities, some of which may be chronic, but superimposed mild infection/pneumonia is not excluded. CT abd/pelvis The  small bowel loops within the left hemiabdomen are dilated with air-fluid levels measuring up to 3.8 cm. Transition to decreased caliber distal small bowel noted within the left hemiabdomen. Findings are compatible with partial small bowel obstruction. 2. Mild increase in moderate volume abdominopelvic ascites. 3. Stable appearance of complex cystic masses within the pelvis and right abdomen compatible with history of ovarian cancer. 4. Stable appearance of peritoneal nodules compatible with peritoneal carcinomatosis. 5. Cholelithiasis. 6. Chronic occlusion of the left common iliac artery. 7.  Aortic Atherosclerosis  Significant Labs:   Significant Imaging Studies: 06-24-2023 abd XR Decompressed stomach with gastric tube in place. 2. Nonobstructive bowel gas pattern 06-26-2023 Interval removal of enteric tube. 2. Increase caliber of small bowel loops within the left hemiabdomen which measure up to 3.3 cm on today's exam. Findings are concerning for recurrent obstruction. 3. Bulging contour of the right lower quadrant of the abdomen is noted which most likely corresponds to large cystic mass noted in the right hemiabdomen along with increasing abdominopelvic ascites.  Antibiotic Therapy: Anti-infectives (From admission, onward)    Start     Dose/Rate Route Frequency Ordered Stop   06/22/23 0430  piperacillin -tazobactam (ZOSYN ) IVPB 3.375 g        3.375 g 100 mL/hr over 30 Minutes Intravenous  Once 06/22/23 0417 06/22/23 0554       Procedures: 06-22-2023 paracentesis for 3.3 liters  Consultants: General surgery Palliative care Gyn/Onc   Hospital Course by Problem: * Small bowel obstruction, partial (HCC) 06-26-2023 I think pt is intermittently obstructing. Likely due to tumor invasion or adhesions to her bowels.  KUB from today shows recurrent SBO. She had episode of vomiting last night. Pt refused NG tube. Dtr does not want it replaced.  06-27-2023 no further vomiting. Pt tolerated  sips/ice chips. Dtr wants pt to try clear liquids. Discussed intermittent bowel obstruction and N/V.  Will add scheduled IV zofran  to prevent any N/V when starting clear liquids. Continue with prn IV fentanyl  25 mcg for pain.  06-28-2023 continue with clear liquids. Only taking 800 ml/day thus far. Continue with scheduled IV zofran . Continue with IV fentanyl  prn for pain control  06-29-2023 I think pt will most likely have bouts with intermittent bowel obstruction due to large intra-abdominal tumor/cancer that is most likely entangled in her small/large bowels. I think if pt eats anything more than bariatric liquid diet, she will develop acute obstruction. Therefore pt will remain on bariatric full liquid diet and NOT advanced to any solid food.  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  06-27-2023 continue with prn IV fentanyl   06-28-2023 continue prn IV fentanyl . Used dose last night.  06-29-2023 Will DC her home with 100 mcg/hr fentanyl  patch as requested by dtr [name]. Pt was on 75 mcg/hr while the hospital. I am only able to provide 2 patches(6 days worth) due to [facility] law regarding opiates. Her oncologist can provide refills vs hospice   Peritoneal carcinomatosis (HCC) 06-26-2023 I had a long 60 min conversation with pt's dtr [name]  outside of pt's room.  I explained to the daughter in very basic terms that the patient has incurable cancer.  She is not a candidate for any sort of chemotherapy.  I do not think hormonal therapy will help either.  I made the analogy of a spit ball against the moving train to help her understand hormonal therapy versus her malignant cancer at this point.  She seemed to understand this analogy.  After further discussion, [name] states that she fears the most that she will not be present when her mother passes.  Her daughter has a trip planned to [place] to help her husband deal with her sick father-in-law.  I discussed that placing  the patient into residential hospice may give her the respite she needs in order to take care of the rest of her family while hospice care cares for her mother.  Discussed that there are no other options available to treat the patient's bowel obstructions other than NG tube which she does not want the patient to have.  Discussed that a feeding tube would not relieve the bowel obstruction.  I think the patient can probably tolerate some clear liquids but I doubt she can tolerate anything more than protein shakes.  After some back-and-forth, the daughter [name] has agreed to place the patient into residential hospice care and move forward with comfort care measures.  I have informed GYN oncology and also palliative care.  Hospice liaison will also be contacted.  Hopefully the patient can be discharged to residential hospice this weekend.  Continue with IV fentanyl  for continued pain.   06-27-2023 dtr understands that there is no curative chemo for patient. Dtr understands that pt's ovarian cancer will continue to progress and ultimately end pt's life. Per gyn/onc pt has a mere days to weeks to live given the advanced cancer she has.  06-28-2023 awaiting bed at [facility]  06-29-2023 Decision made yesterday to discharge pt to home as she does not qualify for residential [facility]. Paracentesis did not help patient. Will DC her home with 100 mcg/hr fentanyl  patch as requested by dtr [name]. Pt was on 75 mcg/hr while the hospital. I am only able to provide 2 patches(6 days worth) due to [facility] law regarding opiates. Her oncologist can provide refills vs hospice. I think pt will most likely have bouts with intermittent bowel obstruction due to large intra-abdominal tumor/cancer that is most likely entangled in her small/large bowels. I think if pt eats anything more than bariatric liquid diet, she will develop acute obstruction. Therefore pt will remain on bariatric full liquid diet and NOT advanced to any solid  food.   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  I explained to the daughter in very basic terms that the patient has incurable cancer.  She is not a candidate for any sort of chemotherapy.  I do not think hormonal therapy will help either.  I made the analogy of a spit ball against the moving train to help her understand hormonal therapy versus her malignant cancer at this point.  She seemed to understand this analogy.  After further discussion, [name] states that she fears the most that she will not be present when her mother passes.  Her daughter has a trip planned to [place] to help her husband deal with her sick father-in-law.  I discussed that placing the patient into residential hospice may give her the respite she needs  in order to take care of the rest of her family while hospice care cares for her mother.  Discussed that there are no other options available to treat the patient's bowel obstructions other than NG tube which she does not want the patient to have.  Discussed that a feeding tube would not relieve the bowel obstruction.  I think the patient can probably tolerate some clear liquids but I doubt she can tolerate anything more than protein shakes.  After some back-and-forth, the daughter [name] has agreed to place the patient into residential hospice care and move forward with comfort care measures.  I have informed GYN oncology and also palliative care.  Hospice liaison will also be contacted.  Hopefully the patient can be discharged to residential hospice this weekend.  Continue with IV fentanyl  for continued pain.  06-27-2023 dtr understands that there is no curative chemo for patient. Dtr understands that pt's ovarian cancer will continue to progress and ultimately end pt's life. Per gyn/onc pt has a mere days to weeks to live given the advanced cancer she has.  06-28-2023 awaiting bed at [facility]  06-29-2023 Decision  made yesterday to discharge pt to home as she does not qualify for residential [facility]. Paracentesis did not help patient. Will DC her home with 100 mcg/hr fentanyl  patch as requested by dtr [name]. Pt was on 75 mcg/hr while the hospital. I am only able to provide 2 patches(6 days worth) due to [facility] law regarding opiates. Her oncologist can provide refills vs hospice.  Dehydration 06-26-2023 continue with IVF for now.  06-27-2023 continue with IVF for now. Allow pt to have clear liquids for now.  06-28-2023 continue with clear liquids. Given her intermittent nature of her bowel obstruction due to tumor location and probably tumor involvement of intestines, I doubt she will be able to move past full liquids. I think any solid food would like cause a bowel obstruction.  06-29-2023 pt tolerating full bariatric liquid diet. She will remain on full bariatric liquid diet and NOT advance to solid food.    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  06-27-2023 chronic and metastatic.  06-28-2023 chronic.  06-28-2023 chronic, metastatic and progressive.  Essential hypertension 06-26-2023 stop HTN meds. Pt is now comfort care.  06-27-2023 chronic. Remains on lopressor  for now to prevent any tachycardia from beta-blocker withdrawal. Hold parameters placed.  06-28-2023 chronic.  06-29-2023 continue with lopressor  only now. Not really for BP control but sudden cessation of betablocker could results in withdrawal-tachycardia. Stop checking blood pressures at home.  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  06-27-2023 chronic.  06-28-2023 chronic.  06-29-2023 stable.  Terminal care 06-27-2023 pt made comfort care yesterday. No more labs draws. Awaiting hospice home evaluation.  06-28-2023 awaiting bed at [facility]  06-29-2023 Decision made yesterday to discharge pt to home as she does not qualify for residential [facility]. Pt remains  DNR/DNI/comfort care but will be going home with hospice.    Discharge Diagnoses:  Principal Problem:   Small bowel obstruction, partial (HCC) Active Problems:   Malignant ascites   Peritoneal carcinomatosis (HCC)   Chronic pain due to neoplasm   Schizophrenia, undifferentiated (HCC)   Essential hypertension   Low grade serous carcinoma (HCC)   Dehydration   Terminal care   Discharge Instructions  Discharge Instructions     Call MD for:  difficulty breathing, headache or visual disturbances   Complete by: As directed    Call MD for:  extreme  fatigue   Complete by: As directed    Call MD for:  hives   Complete by: As directed    Call MD for:  persistant dizziness or light-headedness   Complete by: As directed    Call MD for:  persistant nausea and vomiting   Complete by: As directed    Call MD for:  redness, tenderness, or signs of infection (pain, swelling, redness, odor or green/yellow discharge around incision site)   Complete by: As directed    Call MD for:  severe uncontrolled pain   Complete by: As directed    Call MD for:  temperature >100.4   Complete by: As directed    Diet bariatric full liquid   Complete by: As directed    Discharge instructions   Complete by: As directed    1. Follow up with your primary care provider in 1-2 weeks following discharge from hospital. 2. Follow up with hospice agency   Increase activity slowly   Complete by: As directed       Allergies as of 06/29/2023       Reactions   Pork-derived Products         Medication List     STOP taking these medications    acetaminophen  500 MG tablet Commonly known as: TYLENOL    acyclovir 800 MG tablet Commonly known as: ZOVIRAX   amLODipine  5 MG tablet Commonly known as: NORVASC    apixaban  5 MG Tabs tablet Commonly known as: ELIQUIS    baclofen  10 MG tablet Commonly known as: LIORESAL    diazepam  5 MG tablet Commonly known as: VALIUM    fentaNYL  75 MCG/HR Commonly known  as: DURAGESIC  Replaced by: fentaNYL  100 MCG/HR   glipiZIDE  5 MG tablet Commonly known as: GLUCOTROL    HYDROcodone -acetaminophen  5-325 MG tablet Commonly known as: NORCO/VICODIN   Invega  Sustenna 234 MG/1.5ML injection Generic drug: paliperidone    lactulose  10 GM/15ML solution Commonly known as: CHRONULAC    Linzess  290 MCG Caps capsule Generic drug: linaclotide    lisinopril  10 MG tablet Commonly known as: ZESTRIL    metoCLOPramide  10 MG tablet Commonly known as: REGLAN    mupirocin ointment 2 % Commonly known as: BACTROBAN   omeprazole 20 MG capsule Commonly known as: PRILOSEC   ondansetron  4 MG tablet Commonly known as: ZOFRAN    polyethylene glycol powder 17 GM/SCOOP powder Commonly known as: GLYCOLAX /MIRALAX    promethazine  25 MG tablet Commonly known as: PHENERGAN    risperiDONE  0.5 MG tablet Commonly known as: RISPERDAL    traMADol  50 MG tablet Commonly known as: ULTRAM        TAKE these medications    albuterol  0.63 MG/3ML nebulizer solution Commonly known as: ACCUNEB  Take 1 ampule by nebulization every 6 (six) hours as needed for wheezing or shortness of breath.   benztropine  0.5 MG tablet Commonly known as: COGENTIN  Take 1 tablet (0.5 mg total) by mouth at bedtime.   buPROPion  150 MG 24 hr tablet Commonly known as: WELLBUTRIN  XL Take 1 tablet (150 mg total) by mouth daily.   fentaNYL  100 MCG/HR Commonly known as: DURAGESIC  Place 1 patch onto the skin every 3 (three) days for 6 days. Start taking on: July 01, 2023 Replaces: fentaNYL  75 MCG/HR   memantine  5 MG tablet Commonly known as: NAMENDA  Take 1 tablet (5 mg total) by mouth 2 (two) times daily.   metoprolol  tartrate 25 MG tablet Commonly known as: LOPRESSOR  Take 0.5 tablets (12.5 mg total) by mouth 2 (two) times daily.   mirtazapine  15 MG tablet Commonly known as: REMERON  Take  1 tablet (15 mg total) by mouth at bedtime.   ondansetron  4 MG disintegrating tablet Commonly known as:  ZOFRAN -ODT Take 1 tablet (4 mg total) by mouth 3 (three) times daily before meals.   pantoprazole  40 MG tablet Commonly known as: PROTONIX  Take 1 tablet (40 mg total) by mouth 2 (two) times daily.        Allergies  Allergen Reactions   Pork-Derived Products     Discharge Exam: Vitals:   06/28/23 2100 06/29/23 0747  BP: (!) 151/68 130/66  Pulse:  (!) 53  Resp: 17 16  Temp: 97.6 F (36.4 C) 98.5 F (36.9 C)  SpO2: 97% 99%    Physical Exam Vitals and nursing note reviewed.  Constitutional:      Comments: Awake. Able to stand by [facility] to urinate.  HENT:     Head: Normocephalic and atraumatic.  Cardiovascular:     Rate and Rhythm: Normal rate and regular rhythm.  Pulmonary:     Effort: Pulmonary effort is normal. No respiratory distress.     Breath sounds: No rales.  Abdominal:     General: Bowel sounds are normal. There is distension.     Comments: Hard mass on right side of abd  Skin:    General: Skin is warm and dry.     Capillary Refill: Capillary refill takes less than 2 seconds.  Neurological:     Comments: Pleasantly demented     The results of significant diagnostics from this hospitalization (including imaging, microbiology, ancillary and laboratory) are listed below for reference.    Microbiology: Recent Results (from the past 240 hours)  Blood Culture (routine x 2)     Status: None   Collection Time: 06/22/23  4:18 AM   Specimen: BLOOD  Result Value Ref Range Status   Specimen Description BLOOD RIGHT ANTECUBITAL  Final   Special Requests   Final    BOTTLES DRAWN AEROBIC AND ANAEROBIC Blood Culture results may not be optimal due to an inadequate volume of blood received in culture bottles   Culture   Final    NO GROWTH 5 DAYS Performed at [facility], [address], [place], [place] [address]    Report Status 06/27/2023 FINAL  Final  Blood Culture (routine x 2)     Status: None   Collection Time: 06/22/23  4:18 AM   Specimen: BLOOD   Result Value Ref Range Status   Specimen Description BLOOD LEFT ANTECUBITAL  Final   Special Requests   Final    BOTTLES DRAWN AEROBIC AND ANAEROBIC Blood Culture results may not be optimal due to an inadequate volume of blood received in culture bottles   Culture   Final    NO GROWTH 5 DAYS Performed at [facility], [address], [place], [place] [address]    Report Status 06/27/2023 FINAL  Final  Urine Culture     Status: Abnormal   Collection Time: 06/22/23  7:29 AM   Specimen: Urine, Random  Result Value Ref Range Status   Specimen Description URINE, RANDOM  Final   Special Requests NONE Reflexed from [id number]  Final   Culture (A)  Final    <10,000 COLONIES/mL INSIGNIFICANT GROWTH Performed at [facility], [address] [place], [place] [address]    Report Status 06/23/2023 FINAL  Final  Anaerobic culture w Gram Stain     Status: None   Collection Time: 06/22/23  4:26 PM   Specimen: Abdomen  Result  Value Ref Range Status   Specimen Description ABDOMEN PERITONEAL  Final   Special Requests NONE  Final   Gram Stain NO WBC SEEN NO ORGANISMS SEEN   Final   Culture   Final    NO ANAEROBES ISOLATED Performed at [facility], [address] [place], [place] [address]    Report Status 06/27/2023 FINAL  Final  Body fluid culture w Gram Stain     Status: None   Collection Time: 06/22/23  4:36 PM   Specimen: Abdomen; Peritoneal Fluid  Result Value Ref Range Status   Specimen Description ABDOMEN PLEURAL  Final   Special Requests NONE  Final   Gram Stain NO WBC SEEN NO ORGANISMS SEEN   Final   Culture   Final    NO GROWTH 3 DAYS Performed at [facility], [address], [place], [place] [address]    Report Status 06/25/2023 FINAL  Final     Labs: Basic Metabolic Panel: Recent Labs  Lab 06/23/23 0600 06/24/23 0759 06/26/23 0715  NA 141 142 139  K 3.2* 3.8 4.0  CL 105 107 107  CO2 28 23 24   GLUCOSE 60* 106* 82  BUN 11 8 <5*   CREATININE 0.84 0.82 0.74  CALCIUM 8.0* 8.0* 8.2*  MG 2.1 2.0  --   PHOS 3.4 2.2* 2.8   Liver Function Tests: Recent Labs  Lab 06/23/23 0600 06/24/23 0759  AST 16 17  ALT 15 14  ALKPHOS 51 52  BILITOT 0.7 0.6  PROT 5.3* 5.7*  ALBUMIN  2.3* 2.5*   CBC: Recent Labs  Lab 06/23/23 0600 06/24/23 0759  WBC 6.5 7.1  NEUTROABS 3.6 4.1  HGB 10.8* 11.9*  HCT 33.7* 38.0  MCV 90.3 91.6  PLT 284 306   CBG: Recent Labs  Lab 06/26/23 0757 06/26/23 1145 06/27/23 1205 06/27/23 1635 06/27/23 2133  GLUCAP 80 112* 122* 136* 136*   Urinalysis    Component Value Date/Time   COLORURINE YELLOW 06/22/2023 0729   APPEARANCEUR HAZY (A) 06/22/2023 0729   LABSPEC >1.046 (H) 06/22/2023 0729   PHURINE 6.0 06/22/2023 0729   GLUCOSEU NEGATIVE 06/22/2023 0729   HGBUR SMALL (A) 06/22/2023 0729   BILIRUBINUR NEGATIVE 06/22/2023 0729   KETONESUR NEGATIVE 06/22/2023 0729   PROTEINUR 30 (A) 06/22/2023 0729   UROBILINOGEN 0.2 10/23/2014 1102   NITRITE NEGATIVE 06/22/2023 0729   LEUKOCYTESUR LARGE (A) 06/22/2023 0729   Sepsis Labs Recent Labs  Lab 06/23/23 0600 06/24/23 0759  WBC 6.5 7.1    Procedures/Studies: DG Abd 1 View Result Date: 06/26/2023 CLINICAL DATA:  Vomiting. EXAM: ABDOMEN - 1 VIEW COMPARISON:  06/23/2023 FINDINGS: Interval removal of enteric tube. Interval decrease in volume of enteric contrast material from the colon. However, there is been increase caliber of the small bowel loops within the left hemiabdomen which measure up to 3.3 cm on today's exam. No supine evidence for pneumoperitoneum. Bulging contour of the right lower quadrant of the abdomen is noted which most likely corresponds to large cystic mass noted in the right hemiabdomen along with recurrent abdominopelvic ascites. IMPRESSION: 1. Interval removal of enteric tube. 2. Increase caliber of small bowel loops within the left hemiabdomen which measure up to 3.3 cm on today's exam. Findings are concerning for  recurrent obstruction. 3. Bulging contour of the right lower quadrant of the abdomen is noted which most likely corresponds to large cystic mass noted in the right hemiabdomen along with increasing abdominopelvic ascites. Electronically Signed   By:  Kimberley Penman M.D.   On: 06/26/2023 04:15   DG Abd Portable 1V-Small Bowel Obstruction Protocol-initial, 8 hr delay Result Date: 06/24/2023 CLINICAL DATA:  Follow-up small bowel obstruction EXAM: PORTABLE ABDOMEN - 1 VIEW COMPARISON:  the previous day's study FINDINGS: Gastric tube into the decompressed stomach. Scattered gas in right abdominal nondilated small bowel loops. Oral contrast material in the decompressed colon. Patchy aortoiliac calcified plaque. Bilateral hip DJD. IMPRESSION: 1. Decompressed stomach with gastric tube in place. 2. Nonobstructive bowel gas pattern. Electronically Signed   By: Nicoletta Barrier M.D.   On: 06/24/2023 08:03   DG Abd 1 View Result Date: 06/23/2023 CLINICAL DATA:  Small-bowel obstruction EXAM: ABDOMEN - 1 VIEW COMPARISON:  Abdominal radiograph dated 06/22/2023 FINDINGS: Partially imaged enteric tube tip projects over the left upper quadrant. Nonobstructive bowel gas pattern. Large soft tissue density within the right hemiabdomen displaces bowel loops into the left hemiabdomen. No free air or pneumatosis. Excreted contrast material within the urinary bladder. No acute or substantial osseous abnormality. The sacrum and coccyx are partially obscured by overlying bowel contents. Partially imaged lung bases are clear. IMPRESSION: 1. Nonobstructive bowel gas pattern. 2. Large soft tissue density within the right hemiabdomen displaces bowel loops into the left hemiabdomen, better evaluated on prior CT. Electronically Signed   By: Limin  Xu M.D.   On: 06/23/2023 11:51   IR Paracentesis Result Date: 06/23/2023 INDICATION: 73 year old female with history of ovarian cancer and recurrent malignant ascites. Request for therapeutic and  diagnostic paracentesis. EXAM: ULTRASOUND GUIDED  PARACENTESIS MEDICATIONS: 7 mL 1% lidocaine  COMPLICATIONS: None immediate. PROCEDURE: Informed written consent was obtained from the patient's daughter after a discussion of the risks, benefits and alternatives to treatment. A timeout was performed prior to the initiation of the procedure. Initial ultrasound scanning demonstrates a small amount of ascites within the left lower abdominal quadrant. The left lower abdomen was prepped and draped in the usual sterile fashion. 1% lidocaine  was used for local anesthesia. Following this, a 19 gauge, 7-cm, Yueh catheter was introduced. An ultrasound image was saved for documentation purposes. The paracentesis was performed. The catheter was removed and a dressing was applied. The patient tolerated the procedure well without immediate post procedural complication. FINDINGS: A total of approximately 3.3 L of hazy amber fluid was removed. Samples were sent to the laboratory as requested by the clinical team. IMPRESSION: Successful ultrasound-guided paracentesis yielding 3.3 liters of peritoneal fluid. Performed by: Aimee Han, PA-C Electronically Signed   By: Nicoletta Barrier M.D.   On: 06/23/2023 07:34   DG Abd 1 View Result Date: 06/22/2023 CLINICAL DATA:  NG tube placement. EXAM: ABDOMEN - 1 VIEW COMPARISON:  CT earlier today FINDINGS: Tip and side port of the enteric tube below the diaphragm in the stomach. Air-filled loops of bowel in the upper abdomen. IMPRESSION: Tip and side port of the enteric tube below the diaphragm in the stomach. Electronically Signed   By: Chadwick Colonel M.D.   On: 06/22/2023 19:47   CT ABDOMEN PELVIS W CONTRAST Result Date: 06/22/2023 CLINICAL DATA:  Evaluate for bowel obstruction. History of ovarian cancer. Complains of abdominal pain and distension. * Tracking Code: BO * EXAM: CT ABDOMEN AND PELVIS WITH CONTRAST TECHNIQUE: Multidetector CT imaging of the abdomen and pelvis was performed using the  standard protocol following bolus administration of intravenous contrast. RADIATION DOSE REDUCTION: This exam was performed according to the departmental dose-optimization program which includes automated exposure control, adjustment of the mA and/or kV according to patient  size and/or use of iterative reconstruction technique. CONTRAST:  75mL OMNIPAQUE  IOHEXOL  350 MG/ML SOLN COMPARISON:  12/20/2022. FINDINGS: Lower chest: No pleural effusion or airspace consolidation. Hepatobiliary: Previous hemangioma within the lateral segment of left lobe of liver measures 0.9 cm, unchanged from previous exam. Small low-density structure within the inferior right lobe of liver measures 7 mm, image 28/3. Also unchanged. The gallbladder appears decompressed containing multiple stones measuring up to 5 mm, image 23/3. Common bile duct measures up to 6 mm. Pancreas: Unremarkable. No pancreatic ductal dilatation or surrounding inflammatory changes. Spleen: Normal in size without focal abnormality. Adrenals/Urinary Tract: Normal appearance of the adrenal glands. No nephrolithiasis, hydronephrosis or suspicious mass. Urinary bladder appears within normal limits. Stomach/Bowel: Stomach appears normal. The small bowel loops within the left hemiabdomen are dilated with air-fluid levels measuring up to 3.8 cm, image 63/3. Transition to decreased caliber distal small bowel noted within the left hemiabdomen, image 38/6. The colon is nondilated. Gas and stool noted up to the level of the rectum. Vascular/Lymphatic: Aortic atherosclerosis. No aneurysm. No abdominopelvic adenopathy. Chronic occlusion of the left common iliac artery. Reproductive: The complex cystic mass within the left paramidline pelvis measures 13.9 x 12.0 cm, image 70/3. Previously 13.4 by 11.5 cm. The complex cystic mass within the right abdomen measures 22.7 x 16.8 cm, image 51/3. Previously 21.4 by 16.9 cm. Other: Mild increase in moderate volume abdominopelvic ascites.  Scattered peritoneal nodules are again seen. -Index nodule within the midline ventral abdomen measures 0.7 cm, image 32/3. Previously 0.6 cm. -Cystic peritoneal nodule within the ventral abdomen measures 2.7 x 2.3 cm, image 45/3. Previously this measured the same. Musculoskeletal: No acute or significant osseous findings. IMPRESSION: 1. The small bowel loops within the left hemiabdomen are dilated with air-fluid levels measuring up to 3.8 cm. Transition to decreased caliber distal small bowel noted within the left hemiabdomen. Findings are compatible with partial small bowel obstruction. 2. Mild increase in moderate volume abdominopelvic ascites. 3. Stable appearance of complex cystic masses within the pelvis and right abdomen compatible with history of ovarian cancer. 4. Stable appearance of peritoneal nodules compatible with peritoneal carcinomatosis. 5. Cholelithiasis. 6. Chronic occlusion of the left common iliac artery. 7.  Aortic Atherosclerosis (ICD10-I70.0). Electronically Signed   By: Kimberley Penman M.D.   On: 06/22/2023 05:33   DG Abdomen Acute W/Chest Result Date: 06/22/2023 EXAM: XR Acute Abdomen Series with XR Chest, upright and supine views of the abdomen, frontal view of the chest. CLINICAL HISTORY: Vomiting. COMPARISON: CTA chest, CT abdomen/pelvis dated 12/20/2022. FINDINGS: LUNGS: Increased interstitial opacities with patchy bibasilar opacities, some of which may be chronic, but superimposed mild infection/pneumonia is not excluded. PLEURAL SPACES: No pleural effusion. No pneumothorax. HEART: The heart size is normal. MEDIASTINUM: Thoracic aortic atherosclerosis. PERITONEUM: No appreciable free air. BOWEL: Mildly dilated loops of small bowel in the left mid abdomen, nonspecific. Small bowel obstruction not occluded. BONES: No acute osseous abnormality. IMPRESSION: 1. Mildly dilated loops of small bowel in the left mid abdomen, nonspecific, small bowel obstruction not excluded. 2. Increased  interstitial opacities with patchy bibasilar opacities, some of which may be chronic, but superimposed mild infection/pneumonia is not excluded. Electronically signed by: Zadie Herter MD 06/22/2023 03:59 AM EDT RP Workstation: [id number]    Time coordinating discharge: 55 mins  SIGNED:  Unk Garb, DO [facility] 06/29/23, 9:35 AM

## 2023-06-29 NOTE — Discharge Instructions (Signed)
Full Liquid Nutrition Therapy ? ?The full liquid diet includes mostly liquids (including milk) and some foods with small amounts of fiber. The full liquid diet can provide many of the nutrients your body needs, but it may not give enough vitamins, minerals, and fiber. ?A fluid is anything that is liquid or anything that would melt if left at room temperature.  You will need to count these foods and liquids--including any liquid used to take medication--as part of your daily fluid intake. Some examples are: ?Coffee, tea, and other hot beverages ?Gelatin  ?Gravy ?Ice cream, sherbet, sorbet ?Ice cubes, ice chips ?Milk, liquid creamer ?Nutritional supplements ?Popsicles ?Vegetable and fruit juices; fluid in canned fruit (fruit itself is not allowed) ?Yogurt (without nuts, seeds, or fruit) ?Soft drinks, lemonade, limeade ?Soups (strained) ?Syrup ?This diet should only be used temporarily during your recovery until it is safe for you to eat regular foods. Your registered dietitian nutritionist can help establish a nutritionally-balanced full liquid meal plan, if needed. ?  ? ?Tips ?Determine which foods/fluids from the list are most appealing to you. ?Eat or drink your favorite flavors to help you better enjoy this diet. ?Include milk-based fluids and juices. If you are lactose intolerant, choose plant-based milks. ?Eat 3 full liquid meals throughout the day and include a snack time between each meal. ?Drink nutritional shakes in 6-8 ounce servings as part of a meal or between meals to make sure you?re taking in enough calories. You can make fortified shakes for yourself or buy them premade at a store. ? ?Foods Recommended ?Food Group Foods Recommended  ?Grains Thin hot cereal, such as cream of wheat  ?Dairy Milk: Nonfat, 1%, 2%, whole ?Soy milk, almond milk, rice milk, coconut milk, cashew milk ?Milkshakes ?Yogurt ?Custard ?Pudding  ?Vegetables Vegetable juice with or without pulp ?Thin, pureed vegetable soups  ?Fruits  Translucent fruit juices without pulp (apple, cranberry, grape)  ?Oils Oils: Almond, avocado, canola, cashew, corn, grapeseed, olive, safflower, sesame, soybean, sunflower ?Butter (melted) ?Margarine (melted)   ?Other Flavored gelatin (any flavor) ?Strained cream soups ?Chicken, beef, or vegetable broths ?Popsicle  ?Beverages Water ?Ice ?Soda ?Tea ?Coffee ?Nutritional supplements or shakes  ?  ?A high-protein shake should contain at least 8-10 grams of protein per serving. Read product labels to find a shake that is high in protein. If you are preparing the shake at home, you can increase the amount of protein by adding protein powder, non-fat dry milk powder, yogurt, or low-fat milk. ? ?  ? ?Foods Not Recommended ?Food Group Foods Not Recommended  ?Grains All grain foods including whole grains, processed grains such as pasta, rice, cold cereals, bread, snacks, and sweets that are flour-based (cakes, cookies)  ?Protein Foods Beef and pork  ?Chicken and [place]  ?Fish  ?Nuts and nut butters  ?Eggs  ?Meat substitutes ?Cold cuts or lunch meat  ?Sausage   ?Dairy Hard cheese ?Yogurt with fruit chunks  ?Vegetables Whole, frozen, fresh, canned varieties   ?Fruits Whole, frozen, fresh, canned varieties   ?Oils Butter ?Coconut oil ?Palm Oil ?Lard  ? ?Full Liquid Diet Sample 1-Day Menu View Nutrient Info ?Breakfast 1 cup cream of wheat ?1 cup yogurt (without nuts, seeds, or fruit) ?? cup orange juice (without pulp) ?? cup high-protein nutritional shake ?1 cup coffee  ?Lunch 1 cup cream of potato soup, blended and strained ?? cup apple juice ?1 cup 1% milk ?1 cup tea  ?Evening Meal 1 cup cream of broccoli soup, blended and strained ?1 cup  1% milk ?1 cup high-protein nutritional shake  ?Daily Sum ?Nutrient Unit Value  ?Macronutrients  ?Energy kcal 1467  ?Energy kJ 6123  ?Protein g 60  ?Total lipid (fat) g 47  ?Carbohydrate, by difference g 207  ?Fiber, total dietary g 4  ?Sugars, total g 71  ?Minerals  ?Calcium, Ca mg 1904   ?Iron, Fe mg 20  ?Sodium, Na mg 2828  ?Vitamins  ?Vitamin C, total ascorbic acid mg 144  ?Vitamin A, IU IU 4201  ?Vitamin D IU 402  ?Lipids  ?Fatty acids, total saturated g 17  ?Fatty acids, total monounsaturated g 12  ?Fatty acids, total polyunsaturated g 17  ?Cholesterol mg 89  ?  ? ?Copyright 2020 ? Academy [employer]. All rights reserved ? ?

## 2023-06-29 NOTE — TOC Transition Note (Signed)
 Transition of Care [facility]) - Discharge Note   Patient Details  Name: Kristen Colon MRN: [id number] Date of Birth: 24-Jul-1950  Transition [facility] CM/SW Contact:  Jeani Mill, RN Phone Number: 06/29/2023, 11:25 AM   Clinical Narrative:     Patient stable for discharge.  Notified Melissa with [facility] of discharge.  Daughter will transport home.    Final next level of care: Home w Hospice Care Barriers to Discharge: Barriers Resolved   Patient Goals and CMS Choice Patient states their goals for this hospitalization and ongoing recovery are:: daughter would like patient to return home          Discharge Placement               Home        Discharge Plan and Services Additional resources added to the After Visit Summary for     Discharge Planning Services: CM Consult Post Acute Care Choice: Hospice                               Social Drivers of Health (SDOH) Interventions SDOH Screenings   Food Insecurity: No Food Insecurity (06/22/2023)  Housing: Low Risk  (06/22/2023)  Transportation Needs: No Transportation Needs (06/22/2023)  Utilities: Not At Risk (06/22/2023)  Social Connections: Moderately Isolated (06/22/2023)  Tobacco Use: High Risk (06/22/2023)     Readmission Risk Interventions     No data to display

## 2023-06-29 NOTE — Progress Notes (Signed)
 PROGRESS NOTE    DAZARIA MACNEILL  UJW:[id number] DOB: 1950/09/18 DOA: 06/22/2023 PCP: Patient, No Pcp Per  Subjective: Pt seen and examined. Pt's dtr not at bedside Decision made yesterday to discharge pt to home as she does not qualify for residential [facility].  Pt able to stand up and use BSC.   Hospital Course: The patient is a chronically ill-appearing 73 year old female with a past medical history significant for paranoid schizophrenia, dementia, history of DVT on anticoagulant with apixaban , hypertension, history of metastatic ovarian cancer as diagnosed in 2016 who is currently on home hospice.  She was brought to the hospital given intermittent episodes of nausea vomiting with no bowel movement in about 6 days.    Subsequently she is found to have a bowel obstruction and General Surgery was consulted.  IR was consulted for paracentesis.  NG tube was placed for her partial small bowel obstruction and connected to low intermittent suction.  Per general surgery she is not a surgical candidate and most would recommend IR placement of venting G-tube if she does not improve.    Continues to have output and had 300 mL out of her NG in the canister this morning but abdomen remains distended and she is asking for diet. KUB showed a non-obstructive bowel gas pattern so will initiate small bowel protocol to see if NG can be removed. GYN oncology evaluation to see if they have any other recommendations and they will see the patient in the AM.   6/4: Repeat KUB with no obvious obstruction, clamping NG tube and starting on clear liquid diet.  Daughter does not want any venting G-tube or Pleurx catheter at this time.  She was requesting to see an oncologist. Oncology was already consulted-awaiting formal consult note  6/5: Pulled NG tube earlier today, diet advanced to full liquid.  Patient did not had any more bowel movement after 6/3.  Worsening abdominal distention but does not want paracentesis  at this time. Oncology is recommending outpatient follow-up.  They strongly recommend G-tube placement for venting as she will remain high risk for recurrent ileus/SBO due to cancer burden-daughter is not ready yet.  Assessment and Plan:  Intractable nausea and vomiting secondary to partial small bowel obstruction.  Patient also has massive ascites.  CT scan also suggest some stool in the rectal vault.  Will volume replete with IV fluids.  Correct underlying electrolyte abnormalities including hypokalemia.  Trial of smog enema will be offered.  NGT Suction. Repeat KUB in the AM. Surgery Consulted for further evaluation. Will have patient undergo SBO protocol. If fails conservative measures is not a candidate for surgical intervention and may likely need a Palliative venting g tube based on GOC Discussions.  - Patient pulled out NG tube this morning.  Currently tolerating full liquid diet but did not had any more bowel movement after 6/3. - Will remain high risk for recurrent ileus and SBO due to cancer burden and oncology is recommending G-tube placement-daughter is not ready yet.  Will need replacing NG tube if develop nausea and vomiting.   Massive ascites likely secondary to metastatic ovarian cancer and peritoneal carcinomatosis: Previously been on letrozole  and tamoxifen in 2019 and 2021 respectively.  Patient has been receiving periodic abdominal paracentesis.  S/p IR guided abdominal paracentesis for comfort.  Hold anticoagulation in anticipation for procedure.  Anticoagulation w/ Heparin  gtt post-procedure. There has been prior discussion about having her obtain a Pleurx due to recurrent ascites requiring paracentesis but will defer to  IR Daughter does not want Pleurx catheter at this time - Worsening abdominal distention but patient does not want paracentesis today.   Acute kidney injury most likely due to dehydration: Dehydration caused by intractable nausea and vomiting.  Patient was volume  repleted with "Ringer's"  lactate at 60 mL/hr x1 day but will renew  and change to D5 /LR + 20 mEQ for another 12 hours @ 75 mL/hr. BUN/Cr went from 18/1.24 -> 11/0.84. Avoid Nephrotoxic Medications, Contrast Dyes, Hypotension and Dehydration to Ensure Adequate Renal Perfusion and will need to Renally Adjust Meds. CTM and Trend Renal Function carefully and repeat CMP in the AM    Complex cystic pelvic mass: Compatible with metastatic ovarian cancer. Patient daughter wishes to discuss options regarding treatment with oncology. 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  Patient is on chronic anticoagulation with apixaban .  Will hold anticoagulation for now due to pending abdominal paracentesis procedure and because of pSBO. Start Heparin  gtt after Paracentesis and continue until SBO improves and tolerating diet  Hypoglycemia: Improved.  Continue to monitor  Normocytic Anemia/Anemia of Malignancy: Hgb/Hct is now 10.8/33.7. Check Anemia Panel in the AM. CTM for S/Sx of Bleeding as patient is on Heparin  gtt for [facility]. No overt bleeding noted. Repeat CBC in the AM    Hypokalemia: Resolved today  History of paranoid schizophrenia, bipolar disorder and cognitive impairment: Patient is on Invega  injections monthly.  Next dose on 06/21.  IV lorazepam  for anxiety.  Able to take p.o. safely continue with mirtazapine  50 mg p.o. nightly and risperidone  0.5 mg p.o. twice daily as needed for bipolar   Essential Hypertension: Will hold home regimen (ordered on MAR though) for now due to n.p.o. status.  IV hydralazine  as needed. CTM BP per protocol.   GERD/GI prophylaxis: With IV PPI with pantoprazole  40 mg every 24  Hypoalbuminemia: Albumin  at 2.5 today  HPI: ELLYSON RARICK is a 73 y.o. female with medical history significant of paranoid schizophrenia, dementia, DVT on anticoagulation with apixaban ,  hypertension, metastatic ovarian cancer that was diagnosed in 2016.  Patient is on hospice at home.  She is cared for at home by her daughter.  She received periodic abdominal paracentesis.  She has been in her usual state of health daughter noted worsening abdominal distention over the past several days.  For the past 4 days, patient has been having intermittent episodes of nausea and vomiting.  Today she has been unable to keep any food down and has vomited all day.  Vomitus was described as brownish/black.  Despite treatment with promethazine , symptoms do persist hence daughter decided to bring patient to the emergency room to be further evaluated.  No associated complaints of abdominal pain fever or chills.  Per daughter, patient has not had any bowel movements over the past 4 days.   Patient is a poor historian hence most of the history was obtained from daughter.   In the ED, CT scan was obtained and did reveal evidence of partial small bowel obstruction.  There is also evidence of worsening ascites and ovarian mass.  Significant Events: Admitted 06/22/2023 for partial SBO 06-24-2023 SBO resolved 06-26-2023 SBO has returned on abd xr. Pt refused NG tube placement  Admission Labs: Lipase 22 Na 138, K 3.3, CO2 of 30, BUN 18, Scr 1.24, glu 153 WBC 10, HgB 12.4, Plt 350  Admission Imaging Studies: Acute abd series Mildly dilated loops of small  bowel in the left mid abdomen, nonspecific, small bowel obstruction not excluded. 2. Increased interstitial opacities with patchy bibasilar opacities, some of which may be chronic, but superimposed mild infection/pneumonia is not excluded. CT abd/pelvis The small bowel loops within the left hemiabdomen are dilated with air-fluid levels measuring up to 3.8 cm. Transition to decreased caliber distal small bowel noted within the left hemiabdomen. Findings are compatible with partial small bowel obstruction. 2. Mild increase in moderate volume abdominopelvic  ascites. 3. Stable appearance of complex cystic masses within the pelvis and right abdomen compatible with history of ovarian cancer. 4. Stable appearance of peritoneal nodules compatible with peritoneal carcinomatosis. 5. Cholelithiasis. 6. Chronic occlusion of the left common iliac artery. 7.  Aortic Atherosclerosis  Significant Labs:   Significant Imaging Studies: 06-24-2023 abd XR Decompressed stomach with gastric tube in place. 2. Nonobstructive bowel gas pattern 06-26-2023 Interval removal of enteric tube. 2. Increase caliber of small bowel loops within the left hemiabdomen which measure up to 3.3 cm on today's exam. Findings are concerning for recurrent obstruction. 3. Bulging contour of the right lower quadrant of the abdomen is noted which most likely corresponds to large cystic mass noted in the right hemiabdomen along with increasing abdominopelvic ascites.  Antibiotic Therapy: Anti-infectives (From admission, onward)    Start     Dose/Rate Route Frequency Ordered Stop   06/22/23 0430  piperacillin -tazobactam (ZOSYN ) IVPB 3.375 g        3.375 g 100 mL/hr over 30 Minutes Intravenous  Once 06/22/23 0417 06/22/23 0554       Procedures: 06-22-2023 paracentesis for 3.3 liters  Consultants: General surgery Palliative care Gyn/Onc    Assessment and Plan: * Small bowel obstruction, partial (HCC) 06-26-2023 I think pt is intermittently obstructing. Likely due to tumor invasion or adhesions to her bowels.  KUB from today shows recurrent SBO. She had episode of vomiting last night. Pt refused NG tube. Dtr does not want it replaced.  06-27-2023 no further vomiting. Pt tolerated sips/ice chips. Dtr wants pt to try clear liquids. Discussed intermittent bowel obstruction and N/V.  Will add scheduled IV zofran  to prevent any N/V when starting clear liquids. Continue with prn IV fentanyl  25 mcg for pain.  06-28-2023 continue with clear liquids. Only taking 800 ml/day thus far. Continue  with scheduled IV zofran . Continue with IV fentanyl  prn for pain control  06-29-2023 I think pt will most likely have bouts with intermittent bowel obstruction due to large intra-abdominal tumor/cancer that is most likely entangled in her small/large bowels. I think if pt eats anything more than bariatric liquid diet, she will develop acute obstruction. Therefore pt will remain on bariatric full liquid diet and NOT advanced to any solid food.  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  06-27-2023 continue with prn IV fentanyl   06-28-2023 continue prn IV fentanyl . Used dose last night.  06-29-2023 Will DC her home with 100 mcg/hr fentanyl  patch as requested by dtr [name]. Pt was on 75 mcg/hr while the hospital. I am only able to provide 2 patches(6 days worth) due to [facility] law regarding opiates. Her oncologist can provide refills vs hospice   Peritoneal carcinomatosis (HCC) 06-26-2023 I had a long 60 min conversation with pt's dtr [name] outside of pt's room.  I explained to the daughter in very basic terms that the patient has incurable cancer.  She is not a candidate for any sort of chemotherapy.  I do not think hormonal therapy will  help either.  I made the analogy of a spit ball against the moving train to help her understand hormonal therapy versus her malignant cancer at this point.  She seemed to understand this analogy.  After further discussion, [name] states that she fears the most that she will not be present when her mother passes.  Her daughter has a trip planned to [place] to help her husband deal with her sick father-in-law.  I discussed that placing the patient into residential hospice may give her the respite she needs in order to take care of the rest of her family while hospice care cares for her mother.  Discussed that there are no other options available to treat the patient's bowel obstructions other than NG tube which she does not want the patient to  have.  Discussed that a feeding tube would not relieve the bowel obstruction.  I think the patient can probably tolerate some clear liquids but I doubt she can tolerate anything more than protein shakes.  After some back-and-forth, the daughter [name] has agreed to place the patient into residential hospice care and move forward with comfort care measures.  I have informed GYN oncology and also palliative care.  Hospice liaison will also be contacted.  Hopefully the patient can be discharged to residential hospice this weekend.  Continue with IV fentanyl  for continued pain.   06-27-2023 dtr understands that there is no curative chemo for patient. Dtr understands that pt's ovarian cancer will continue to progress and ultimately end pt's life. Per gyn/onc pt has a mere days to weeks to live given the advanced cancer she has.  06-28-2023 awaiting bed at [facility]  06-29-2023 Decision made yesterday to discharge pt to home as she does not qualify for residential [facility]. Paracentesis did not help patient. Will DC her home with 100 mcg/hr fentanyl  patch as requested by dtr [name]. Pt was on 75 mcg/hr while the hospital. I am only able to provide 2 patches(6 days worth) due to [facility] law regarding opiates. Her oncologist can provide refills vs hospice. I think pt will most likely have bouts with intermittent bowel obstruction due to large intra-abdominal tumor/cancer that is most likely entangled in her small/large bowels. I think if pt eats anything more than bariatric liquid diet, she will develop acute obstruction. Therefore pt will remain on bariatric full liquid diet and NOT advanced to any solid food.   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  I explained to the daughter in very basic terms that the patient has incurable cancer.  She is not a candidate for any sort of chemotherapy.  I do not think hormonal therapy will help either.  I made  the analogy of a spit ball against the moving train to help her understand hormonal therapy versus her malignant cancer at this point.  She seemed to understand this analogy.  After further discussion, [name] states that she fears the most that she will not be present when her mother passes.  Her daughter has a trip planned to [place] to help her husband deal with her sick father-in-law.  I discussed that placing the patient into residential hospice may give her the respite she needs in order to take care of the rest of her family while hospice care cares for her mother.  Discussed that there are no other options available to treat the patient's bowel obstructions other than NG tube which she  does not want the patient to have.  Discussed that a feeding tube would not relieve the bowel obstruction.  I think the patient can probably tolerate some clear liquids but I doubt she can tolerate anything more than protein shakes.  After some back-and-forth, the daughter [name] has agreed to place the patient into residential hospice care and move forward with comfort care measures.  I have informed GYN oncology and also palliative care.  Hospice liaison will also be contacted.  Hopefully the patient can be discharged to residential hospice this weekend.  Continue with IV fentanyl  for continued pain.  06-27-2023 dtr understands that there is no curative chemo for patient. Dtr understands that pt's ovarian cancer will continue to progress and ultimately end pt's life. Per gyn/onc pt has a mere days to weeks to live given the advanced cancer she has.  06-28-2023 awaiting bed at [facility]  06-29-2023 Decision made yesterday to discharge pt to home as she does not qualify for residential [facility]. Paracentesis did not help patient. Will DC her home with 100 mcg/hr fentanyl  patch as requested by dtr [name]. Pt was on 75 mcg/hr while the hospital. I am only able to provide 2 patches(6 days worth) due to [facility] law  regarding opiates. Her oncologist can provide refills vs hospice.  Dehydration 06-26-2023 continue with IVF for now.  06-27-2023 continue with IVF for now. Allow pt to have clear liquids for now.  06-28-2023 continue with clear liquids. Given her intermittent nature of her bowel obstruction due to tumor location and probably tumor involvement of intestines, I doubt she will be able to move past full liquids. I think any solid food would like cause a bowel obstruction.  06-29-2023 pt tolerating full bariatric liquid diet. She will remain on full bariatric liquid diet and NOT advance to solid food.    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  06-27-2023 chronic and metastatic.  06-28-2023 chronic.  06-28-2023 chronic, metastatic and progressive.  Essential hypertension 06-26-2023 stop HTN meds. Pt is now comfort care.  06-27-2023 chronic. Remains on lopressor  for now to prevent any tachycardia from beta-blocker withdrawal. Hold parameters placed.  06-28-2023 chronic.  06-29-2023 continue with lopressor  only now. Not really for BP control but sudden cessation of betablocker could results in withdrawal-tachycardia. Stop checking blood pressures at home.  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  06-27-2023 chronic.  06-28-2023 chronic.  06-29-2023 stable.  Terminal care 06-27-2023 pt made comfort care yesterday. No more labs draws. Awaiting hospice home evaluation.  06-28-2023 awaiting bed at [facility]  06-29-2023 Decision made yesterday to discharge pt to home as she does not qualify for residential [facility]. Pt remains DNR/DNI/comfort care but will be going home with hospice.   DVT prophylaxis: SCDs Start: 06/22/23 1610    Code Status: Do not attempt resuscitation (DNR) - Comfort care Family Communication: no family at bedside. Spoke with dtr yesterday at bedside Disposition Plan: return home Reason for continuing  need for hospitalization: stable for DC to home.  Objective: Vitals:   06/28/23 0727 06/28/23 1034 06/28/23 2100 06/29/23 0747  BP: 137/61  (!) 151/68 130/66  Pulse: 64 (!) 58  (!) 53  Resp:   17 16  Temp: 98 F (36.7 C)  97.6 F (36.4 C) 98.5 F (36.9 C)  TempSrc:   Oral   SpO2: 92%  97% 99%  Weight:      Height:        Intake/Output Summary (Last 24 hours) at  06/29/2023 0934 Last data filed at 06/28/2023 2300 Gross per 24 hour  Intake 237 ml  Output --  Net 237 ml   Filed Weights   06/22/23 0258  Weight: 62.6 kg    Examination:  Physical Exam Vitals and nursing note reviewed.  Constitutional:      Comments: Awake. Able to stand by [facility] to urinate.  HENT:     Head: Normocephalic and atraumatic.  Cardiovascular:     Rate and Rhythm: Normal rate and regular rhythm.  Pulmonary:     Effort: Pulmonary effort is normal. No respiratory distress.     Breath sounds: No rales.  Abdominal:     General: Bowel sounds are normal. There is distension.     Comments: Hard mass on right side of abd  Skin:    General: Skin is warm and dry.     Capillary Refill: Capillary refill takes less than 2 seconds.  Neurological:     Comments: Pleasantly demented    Data Reviewed: I have personally reviewed following labs and imaging studies  CBC: Recent Labs  Lab 06/23/23 0600 06/24/23 0759  WBC 6.5 7.1  NEUTROABS 3.6 4.1  HGB 10.8* 11.9*  HCT 33.7* 38.0  MCV 90.3 91.6  PLT 284 306   Basic Metabolic Panel: Recent Labs  Lab 06/23/23 0600 06/24/23 0759 06/26/23 0715  NA 141 142 139  K 3.2* 3.8 4.0  CL 105 107 107  CO2 28 23 24   GLUCOSE 60* 106* 82  BUN 11 8 <5*  CREATININE 0.84 0.82 0.74  CALCIUM 8.0* 8.0* 8.2*  MG 2.1 2.0  --   PHOS 3.4 2.2* 2.8   GFR: Estimated Creatinine Clearance: 54.5 mL/min (by C-G formula based on SCr of 0.74 mg/dL). Liver Function Tests: Recent Labs  Lab 06/23/23 0600 06/24/23 0759  AST 16 17  ALT 15 14  ALKPHOS 51 52  BILITOT 0.7  0.6  PROT 5.3* 5.7*  ALBUMIN  2.3* 2.5*   CBG: Recent Labs  Lab 06/26/23 0757 06/26/23 1145 06/27/23 1205 06/27/23 1635 06/27/23 2133  GLUCAP 80 112* 122* 136* 136*   Recent Results (from the past 240 hours)  Blood Culture (routine x 2)     Status: None   Collection Time: 06/22/23  4:18 AM   Specimen: BLOOD  Result Value Ref Range Status   Specimen Description BLOOD RIGHT ANTECUBITAL  Final   Special Requests   Final    BOTTLES DRAWN AEROBIC AND ANAEROBIC Blood Culture results may not be optimal due to an inadequate volume of blood received in culture bottles   Culture   Final    NO GROWTH 5 DAYS Performed at [facility], [address] [place], [place] [address]    Report Status 06/27/2023 FINAL  Final  Blood Culture (routine x 2)     Status: None   Collection Time: 06/22/23  4:18 AM   Specimen: BLOOD  Result Value Ref Range Status   Specimen Description BLOOD LEFT ANTECUBITAL  Final   Special Requests   Final    BOTTLES DRAWN AEROBIC AND ANAEROBIC Blood Culture results may not be optimal due to an inadequate volume of blood received in culture bottles   Culture   Final    NO GROWTH 5 DAYS Performed at [facility], [address], [place], [place] [address]    Report Status 06/27/2023 FINAL  Final  Urine Culture     Status: Abnormal   Collection Time: 06/22/23  7:29 AM  Specimen: Urine, Random  Result Value Ref Range Status   Specimen Description URINE, RANDOM  Final   Special Requests NONE Reflexed from [id number]  Final   Culture (A)  Final    <10,000 COLONIES/mL INSIGNIFICANT GROWTH Performed at [facility], [address] [place], [place] [address]    Report Status 06/23/2023 FINAL  Final  Anaerobic culture w Gram Stain     Status: None   Collection Time: 06/22/23  4:26 PM   Specimen: Abdomen  Result Value Ref Range Status   Specimen Description ABDOMEN PERITONEAL  Final   Special Requests NONE  Final   Gram Stain NO WBC SEEN NO  ORGANISMS SEEN   Final   Culture   Final    NO ANAEROBES ISOLATED Performed at [facility], [address] [place], [place] [address]    Report Status 06/27/2023 FINAL  Final  Body fluid culture w Gram Stain     Status: None   Collection Time: 06/22/23  4:36 PM   Specimen: Abdomen; Peritoneal Fluid  Result Value Ref Range Status   Specimen Description ABDOMEN PLEURAL  Final   Special Requests NONE  Final   Gram Stain NO WBC SEEN NO ORGANISMS SEEN   Final   Culture   Final    NO GROWTH 3 DAYS Performed at [facility], [address], [place], [place] [address]    Report Status 06/25/2023 FINAL  Final    Scheduled Meds:  benztropine   0.5 mg Oral QHS   buPROPion   150 mg Oral Daily   diazepam   5 mg Oral QHS   feeding supplement  237 mL Oral TID BM   fentaNYL   1 patch Transdermal Q72H   memantine   5 mg Oral BID   metoprolol  tartrate  12.5 mg Oral BID   mirtazapine   15 mg Oral QHS   ondansetron   4 mg Oral TID AC   pantoprazole   40 mg Oral BID   Continuous Infusions:   LOS: 7 days   Time spent: 55 minutes  Unk Garb, DO  [facility]  06/29/2023, 9:34 AM

## 2023-07-15 NOTE — Telephone Encounter (Signed)
 Hi Darice, Could you call the patient's daughter tomorrow to check in? We thought she was going to go to inpatient hospice but it looks like she went home. Could you see how she is doing and whether there is anything we can help with? Thanks! Kat

## 2023-07-16 ENCOUNTER — Telehealth: Payer: Self-pay | Admitting: Oncology

## 2023-07-16 NOTE — Telephone Encounter (Signed)
 Left a message for Rock (daughter) to see how [place] is doing and to see if there is anything we can help with.  Requested a return call.

## 2023-07-22 NOTE — Telephone Encounter (Signed)
 Left another message for Rock and requested a return call.

## 2023-08-04 ENCOUNTER — Other Ambulatory Visit (HOSPITAL_COMMUNITY): Payer: Self-pay | Admitting: Internal Medicine

## 2023-08-04 DIAGNOSIS — C562 Malignant neoplasm of left ovary: Secondary | ICD-10-CM

## 2023-08-07 ENCOUNTER — Ambulatory Visit (HOSPITAL_COMMUNITY)
Admission: RE | Admit: 2023-08-07 | Discharge: 2023-08-07 | Disposition: A | Discharge status: Home / Self Care | Source: Ambulatory Visit | Attending: Internal Medicine | Admitting: Internal Medicine

## 2023-08-07 DIAGNOSIS — C562 Malignant neoplasm of left ovary: Secondary | ICD-10-CM | POA: Insufficient documentation

## 2023-08-07 DIAGNOSIS — R180 Malignant ascites: Secondary | ICD-10-CM | POA: Diagnosis not present

## 2023-08-07 DIAGNOSIS — C569 Malignant neoplasm of unspecified ovary: Secondary | ICD-10-CM | POA: Diagnosis not present

## 2023-08-07 HISTORY — PX: IR PARACENTESIS: IMG2679

## 2023-08-07 MED ORDER — LIDOCAINE-EPINEPHRINE 1 %-1:100000 IJ SOLN
INTRAMUSCULAR | Status: AC
  Filled 2023-08-07: qty 1

## 2023-08-07 NOTE — Procedures (Signed)
 PROCEDURE SUMMARY:  Successful image-guided paracentesis from the left lower abdomen.  Yielded 3.9 liters of amber fluid.  No immediate complications.  EBL: trace Patient tolerated well.   Specimen not sent for labs.  Please see imaging section of Epic for full dictation.  Kimble DEL Tadarrius Burch PA-C 08/07/2023 3:54 PM

## 2023-09-04 ENCOUNTER — Other Ambulatory Visit (HOSPITAL_COMMUNITY): Payer: Self-pay | Admitting: Internal Medicine

## 2023-09-04 DIAGNOSIS — R140 Abdominal distension (gaseous): Secondary | ICD-10-CM

## 2023-09-04 DIAGNOSIS — R188 Other ascites: Secondary | ICD-10-CM

## 2023-09-07 ENCOUNTER — Ambulatory Visit (HOSPITAL_COMMUNITY)
Admission: RE | Admit: 2023-09-07 | Discharge: 2023-09-07 | Disposition: A | Discharge status: Home / Self Care | Source: Ambulatory Visit | Attending: Internal Medicine | Admitting: Internal Medicine

## 2023-09-07 DIAGNOSIS — R140 Abdominal distension (gaseous): Secondary | ICD-10-CM

## 2023-09-07 DIAGNOSIS — C569 Malignant neoplasm of unspecified ovary: Secondary | ICD-10-CM | POA: Diagnosis not present

## 2023-09-07 DIAGNOSIS — R188 Other ascites: Secondary | ICD-10-CM | POA: Insufficient documentation

## 2023-09-07 DIAGNOSIS — R180 Malignant ascites: Secondary | ICD-10-CM | POA: Diagnosis not present

## 2023-09-07 HISTORY — PX: IR PARACENTESIS: IMG2679

## 2023-09-07 MED ORDER — LIDOCAINE-EPINEPHRINE 1 %-1:100000 IJ SOLN
INTRAMUSCULAR | Status: AC
  Filled 2023-09-07: qty 1

## 2023-09-07 MED ORDER — LIDOCAINE-EPINEPHRINE 1 %-1:100000 IJ SOLN
20.0000 mL | Freq: Once | INTRAMUSCULAR | Status: AC
  Administered 2023-09-07: 10 mL via INTRADERMAL

## 2023-10-12 ENCOUNTER — Other Ambulatory Visit (HOSPITAL_COMMUNITY): Payer: Self-pay | Admitting: Internal Medicine

## 2023-10-12 DIAGNOSIS — C562 Malignant neoplasm of left ovary: Secondary | ICD-10-CM

## 2023-10-12 DIAGNOSIS — R180 Malignant ascites: Secondary | ICD-10-CM

## 2023-10-13 ENCOUNTER — Ambulatory Visit (HOSPITAL_COMMUNITY)
Admission: RE | Admit: 2023-10-13 | Discharge: 2023-10-13 | Disposition: A | Discharge status: Home / Self Care | Source: Ambulatory Visit | Attending: Internal Medicine | Admitting: Internal Medicine

## 2023-10-13 DIAGNOSIS — R180 Malignant ascites: Secondary | ICD-10-CM | POA: Diagnosis not present

## 2023-10-13 DIAGNOSIS — C562 Malignant neoplasm of left ovary: Secondary | ICD-10-CM | POA: Insufficient documentation

## 2023-10-13 DIAGNOSIS — C569 Malignant neoplasm of unspecified ovary: Secondary | ICD-10-CM | POA: Diagnosis not present

## 2023-10-13 HISTORY — PX: IR PARACENTESIS: IMG2679

## 2023-10-13 MED ORDER — LIDOCAINE HCL 1 % IJ SOLN
20.0000 mL | Freq: Once | INTRAMUSCULAR | Status: AC
  Administered 2023-10-13: 10 mL

## 2023-10-13 MED ORDER — LIDOCAINE HCL 1 % IJ SOLN
INTRAMUSCULAR | Status: AC
  Filled 2023-10-13: qty 20

## 2023-10-13 NOTE — Procedures (Signed)
 PROCEDURE SUMMARY:  Successful US  guided paracentesis from left lateral abdomen.  Yielded 3 liters of dark brown fluid.  No immediate complications.  Patient tolerated well.  EBL = trace   Kentavious Michele CHRISTELLA Bal PA-C 10/13/2023 2:07 PM

## 2023-10-16 DIAGNOSIS — Z743 Need for continuous supervision: Secondary | ICD-10-CM | POA: Diagnosis not present

## 2023-10-16 DIAGNOSIS — R0902 Hypoxemia: Secondary | ICD-10-CM | POA: Diagnosis not present

## 2023-10-16 DIAGNOSIS — C569 Malignant neoplasm of unspecified ovary: Secondary | ICD-10-CM | POA: Diagnosis not present

## 2023-10-16 DIAGNOSIS — I10 Essential (primary) hypertension: Secondary | ICD-10-CM | POA: Diagnosis not present

## 2023-10-16 DIAGNOSIS — R180 Malignant ascites: Secondary | ICD-10-CM | POA: Diagnosis not present

## 2023-10-16 DIAGNOSIS — R140 Abdominal distension (gaseous): Secondary | ICD-10-CM | POA: Diagnosis not present

## 2023-10-16 DIAGNOSIS — N132 Hydronephrosis with renal and ureteral calculous obstruction: Secondary | ICD-10-CM | POA: Diagnosis not present

## 2023-10-16 DIAGNOSIS — R188 Other ascites: Secondary | ICD-10-CM | POA: Diagnosis not present

## 2023-10-16 DIAGNOSIS — K5900 Constipation, unspecified: Secondary | ICD-10-CM | POA: Diagnosis not present

## 2023-10-16 DIAGNOSIS — F0390 Unspecified dementia without behavioral disturbance: Secondary | ICD-10-CM | POA: Diagnosis not present

## 2023-10-16 DIAGNOSIS — R531 Weakness: Secondary | ICD-10-CM | POA: Diagnosis not present

## 2023-10-16 DIAGNOSIS — J811 Chronic pulmonary edema: Secondary | ICD-10-CM | POA: Diagnosis not present

## 2023-10-16 DIAGNOSIS — G8929 Other chronic pain: Secondary | ICD-10-CM | POA: Diagnosis not present

## 2023-10-16 DIAGNOSIS — R2689 Other abnormalities of gait and mobility: Secondary | ICD-10-CM | POA: Diagnosis not present

## 2023-10-16 DIAGNOSIS — N1330 Unspecified hydronephrosis: Secondary | ICD-10-CM | POA: Diagnosis not present

## 2023-10-16 DIAGNOSIS — R52 Pain, unspecified: Secondary | ICD-10-CM | POA: Diagnosis not present

## 2023-10-16 DIAGNOSIS — M6281 Muscle weakness (generalized): Secondary | ICD-10-CM | POA: Diagnosis not present

## 2023-10-16 DIAGNOSIS — C563 Malignant neoplasm of bilateral ovaries: Secondary | ICD-10-CM | POA: Diagnosis not present

## 2023-10-16 DIAGNOSIS — G309 Alzheimer's disease, unspecified: Secondary | ICD-10-CM | POA: Diagnosis not present

## 2023-10-16 DIAGNOSIS — J810 Acute pulmonary edema: Secondary | ICD-10-CM | POA: Diagnosis not present

## 2023-10-16 DIAGNOSIS — Z66 Do not resuscitate: Secondary | ICD-10-CM | POA: Diagnosis not present

## 2023-10-16 DIAGNOSIS — I959 Hypotension, unspecified: Secondary | ICD-10-CM | POA: Diagnosis not present

## 2023-10-16 DIAGNOSIS — I509 Heart failure, unspecified: Secondary | ICD-10-CM | POA: Diagnosis not present

## 2023-10-16 DIAGNOSIS — F200 Paranoid schizophrenia: Secondary | ICD-10-CM | POA: Diagnosis not present

## 2023-10-16 DIAGNOSIS — R9431 Abnormal electrocardiogram [ECG] [EKG]: Secondary | ICD-10-CM | POA: Diagnosis not present

## 2023-10-16 DIAGNOSIS — I5031 Acute diastolic (congestive) heart failure: Secondary | ICD-10-CM | POA: Diagnosis not present

## 2023-10-16 DIAGNOSIS — C786 Secondary malignant neoplasm of retroperitoneum and peritoneum: Secondary | ICD-10-CM | POA: Diagnosis not present

## 2023-10-16 DIAGNOSIS — R918 Other nonspecific abnormal finding of lung field: Secondary | ICD-10-CM | POA: Diagnosis not present

## 2023-10-16 NOTE — ED Provider Notes (Signed)
 ------------------------------------------------------------------------------- Attestation signed by Merilee Levander Maffucci, MD at 10/17/2023  5:45 PM (Updated) ________________________________________________________________________ ATTENDING SUPERVISORY SHARED SERVICE NOTE  I supervised the care provided by the resident. We have discussed the patient's case. I have reviewed the note and agree with the plan of treatment.    I performed a substantive portion of the medical decision making for the patient encounter, as documented by the resident.    I have personally viewed the imaging studies performed.  Patient's presentation is most consistent with acute presentation with potential threat to life or bodily function  I was present for the following procedures: None  Time Spent in Critical Care of the patient: None   -------------------------------------------------------------------------------  Chief Complaint  Patient presents with  . Weakness - Generalized       Subjective Kristen Colon is a 73 y.o. 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  Patient states that the home hospice nurses said that she had a UTI several days ago.  She has not been able to keep down her antibiotics.  She has not had a bowel movement in approximately 8 days.  She has had multiple episodes of vomiting at home.  She has become weak to the point where she is no longer able to walk.  Patient's daughter states that she did have a paracentesis several days ago but her abdomen looks more distended despite this.  She has been urinating much more frequently than usual.  Denies fevers.  Patient's daughter states that she cannot take care  of her at home.  They have previously been denied inpatient hospice.  She states that she is willing to reverse her hospice at home status if needed.  She does not want her mom to be intubated and does not want CPR but wants every other intervention possible including surgical procedures.  History of Present Illness  Patient History Medical History[1] Surgical History[2] Social History[3]    Review of Systems Pertinent positives and negative findings are listed as part of the History of Present Illness and MDM    Physical Exam ED Triage Vitals  Temp 10/16/23 1608 98.2 F (36.8 C)  Heart Rate 10/16/23 1608 83  Resp 10/16/23 1608 17  BP 10/16/23 1608 143/76  MAP (mmHg) 10/16/23 1608 97  SpO2 10/16/23 1608 93 %  O2 Device 10/16/23 1608 None (Room air)  O2 Flow Rate (L/min) 10/16/23 1747 2 L/min  Weight --     Physical Exam   Constitutional Nursing notes reviewed Vital signs reviewed  HEENT No obvious trauma Pupils round, equal, and reactive to light. Pupils cross midline Neck supple  Respiratory Effort normal Breathing well on room air CTAB  CV Normal rate and rhythm  No pitting edema  Abdomen Soft, significant distention No obvious tenderness, rebound, guarding or peritonitis  MSK Atraumatic No obvious deformity ROM appropriate  Neuro Alert and oriented to person but not place or situation Cranial nerves II through XII intact Symmetric strength in bilateral upper and lower extremities        CHA2DS2-VASc Score: N/A                     ED Course & MDM  Initial Differential Diagnoses includes UTI, SBP, SBO, worsening  metastatic disease, dementia, pneumonia, other infection  I reviewed the patient's vitals, the nursing triage note and evaluated the patient at bedside.   I reviewed the patient's records which show shows that the patient had an IR guided paracentesis on 9/23 which yielded 3 L of dark brown fluid.  This was done at [facility]  Work  appears overall unremarkable.  Labs are very reassuring with no leukocytosis, anemia, AKI, acute liver injury or hyperammonemia.  Patient does not have any evidence of UTI or electrolyte derangements.  CT abdomen pelvis shows worsening metastatic disease with mild right hydro utero nephrosis likely secondary to external compression.  EKG shows normal sinus rhythm without evidence of acute ischemic changes.  No STEMI.  No new onset arrhythmia.  No evidence of high-grade conduction block.    Patient's daughter at bedside states that she is unable to take her home.  She does not have full hospice care at home and the patient is more difficult to take care of.  She has been having difficulty walking over the last 2 days.  I was able to ambulate the patient, but she desaturated.  I placed her on 2 L of oxygen.  Chest x-ray shows mild pulm interstitial edema without evidence of pneumonia or pneumothorax.  I considered PE, but it would not change management.  The patient is already on apixaban  and is a hospice patient.  She has no evidence of massive PE.  patient was admitted for further workup and care Procedures  Medications administered in the ED: Medications  ondansetron  (ZOFRAN ) injection 4 mg (4 mg intravenous Given 10/16/23 1742)  diazePAM  (VALIUM ) injection 2 mg (2 mg intravenous Given 10/16/23 1743)  iohexoL  (OMNIPAQUE ) 350 mg iodine/mL injection (MDV) 80 mL (80 mL intravenous Given 10/16/23 1801)     Impression: 1. Hypoxia Active  2. Weakness Active     Patient's presentation is most consistent with acute presentation with potential threat to life or bodily function  I independently reviewed and interpreted the diagnostic studies (labs, EKG, imaging) documented in the MDM.      Disposition: ED Disposition:  Admit    ED Prescriptions   None             [1] No past medical history on file. [2] No past surgical history on file. [3]

## 2023-10-17 DIAGNOSIS — R0902 Hypoxemia: Secondary | ICD-10-CM | POA: Diagnosis not present

## 2023-10-17 NOTE — H&P (Addendum)
 [facility]ist Admission History and Physical   Date: 10/17/2023        Room: 30/30-A Hospital Day: 1 Attending Physician: Merilee Levander Maffucci, MD;*  Chief Complaint  Kristen Colon is a 73 y.o. woman presenting with Malignant ascites (CMD).  HPI   History of Present Illness Reason for Admit: Generalized weakness.  A 41 year old woman with metastatic ovarian cancer on home hospice care is presenting with generalized weakness. She is accompanied by her daughter, who is her primary caregiver. There has been a noticeable decline in her condition over the past few days; she is unable to walk, experiencing severe weakness, and has reduced her food and fluid intake. Despite being on 2 liters of oxygen, she remains hypoxic, and the etiology is unclear. She is currently on apixaban . Her daughter is seeking additional support and wishes to reverse hospice care while maintaining the patient's DNR/DNI status. They are considering inpatient hospice or a higher level of care, although inpatient hospice care was previously denied. They are also contemplating placement in a rehab facility. The patient is not in severe pain that would require a morphine  or Dilaudid  drip, but she cannot be cared for at home. There is concern about her inability to climb stairs and the risk of falls. She is experiencing urinary incontinence and unusual behavior, such as removing her clothes. She has been on hospice care since 2018. Her daughter has been using herbs, but is unsure of their effectiveness. The patient was diagnosed with COVID-19 four months ago, which led to a rapid decline in her health. She has been receiving monthly paracentesis since last year   The patient was diagnosed with a UTI by the hospice nurse and started on antibiotics, but she vomited after taking them. She has not had a bowel movement in 10 days and is uncomfortable due to abdominal distension. She underwent paracentesis on Monday but continues  to experience wheezing, suggesting fluid accumulation. She is taking Valium  and baclofen  for sharp abdominal pain.    Assessment and Plan  Kristen Colon is a 73 y.o. woman admitted with Malignant ascites (CMD). Principal Problem:   Malignant ascites Resolved Problems:   * No resolved hospital problems. * Assessment & Plan 1. Metastatic ovarian cancer with peritoneal disease, malignant ascites, liver implants. With R ureter partial obstruction with mild hydro. Deterioration over past few days: increased weakness, inability to ambulate, decreased oral intake. Hypoxic on 2 L oxygen. Daughter wishes to find alternatives to home hospice due to need for physical support/placement. Discussed transition to inpatient hospice if qualifying, recent attempts to enroll in respite care, and now advice from the facility that was contracted for respite care to recommend presenting for admission followed by rehab referral if indicated. Discussed the various options and the risk of more burdensome procedure and will consult case management in AM. Consider tunneled catheter for frequent paracentesis. Continue to evalutate for potential infections. Refer to palliative care for further management. - Consider tunneled catheter for frequent paracentesis. - Continue to monitor for evdence of UTI. - Refer to palliative care for further management.  2.Abdominal distension, constipation No bowel movement in 10 days, uncomfortable due to abdominal distension. CT scan revealed ascites due to metastatic spread. Discussed tunneled catheter for frequent paracentesis. Adjust pain medication for abdominal discomfort. - query role of pleurX (acknowledging degree of abdominal distension caused by cystic mass). - Adjust pain medication for abdominal discomfort. - molassess enemain am after breakfast. plus oral osmotic and stimulant laxatives  Emergency  Contact:  Extended Emergency Contact Information Primary Emergency Contact:  Oconnor,[name] Address: [address]  [place] Work Phone: [phone] Mobile Phone: [phone] Relation: Daughter  Code Status: No Order   DVT Ppx: Anticoagulants/Antiplatelets/DVT prophylaxis:     Current Diet:  Estimated date of DC: Oct 19, 2023        History  Past Medical and Surgical History, Family History, Social History Medical History[1] Surgical History[2] Family History[3] Social History   Socioeconomic History  . Marital status: Divorced    Spouse name: Not on file  . Number of children: Not on file  . Years of education: Not on file  . Highest education level: Not on file  Occupational History  . Not on file  Tobacco Use  . Smoking status: Not on file  . Smokeless tobacco: Not on file  Substance and Sexual Activity  . Alcohol use: Not on file  . Drug use: Not on file  . Sexual activity: Not on file  Other Topics Concern  . Not on file  Social History Narrative  . Not on file   Social Drivers of Health   Food Insecurity: No Food Insecurity (06/22/2023)   Received from [facility]   Food vital sign   . Within the past 12 months, you worried that your food would run out before you got money to buy more: Never true   . Within the past 12 months, the food you bought just didn't last and you didn't have money to get more: Never true  Transportation Needs: No Transportation Needs (06/22/2023)   Received from [facility]: [facility] - Transportation   . Lack of Transportation (Medical): No   . Lack of Transportation (Non-Medical): No  Safety: Not At Risk (06/22/2023)   Received from [facility]   Safety   . Within the last year, have you been afraid of your partner or ex-partner?: No   . Within the last year, have you been humiliated or emotionally abused in other ways by your partner or ex-partner?: No   . Within the last year, have you been kicked, hit, slapped, or otherwise physically hurt by your partner or ex-partner?: No   . Within the  last year, have you been raped or forced to have any kind of sexual activity by your partner or ex-partner?: No  Living Situation: Low Risk  (06/22/2023)   Received from [facility] Situation   . In the last 12 months, was there a time when you were not able to pay the mortgage or rent on time?: No   . In the past 12 months, how many times have you moved where you were living?: 0   . At any time in the past 12 months, were you homeless or living in a shelter (including now)?: No    Allergies  Allergies[4]  Home Medications  Home Medications     Med List Status: Pharm Tech complete Set By: Glennis CHRISTELLA Mayotte, CPhT at 10/16/2023 10:53 PM    Status Comment       10/16/2023 10:53 PM    Medications verified by daughter Rock at bedside. States patient no longer taking Bupropion  150 mg, ipratropium nebulizer, medications were discontinued by another physician. Patient also no longer taking Eliquis  starter pack, patient has completed therapy and is now taking Eliquis  5 mg which was added to chart. Daughter states patient has recently has low blood pressure. Daughter states patient was never taking Furosemide  20 mg. No  additional team members were contacted prior to completion of med rec. DPS declined. Electronically signed by: Glennis CHRISTELLA Mayotte, CPhT 10/16/2023 10:53 PM            * acetaminophen  (TYLENOL ) 500 mg tablet   * alum-mag hydroxide-simethicone  (MAALOX, MYLANTA) 200-200-20 mg/5 mL susp suspension    Take 30 mL by mouth every 4 (four) hours as needed.   * amLODIPine  (NORVASC ) 5 mg tablet   * apixaban  (ELIQUIS ) 5 mg tab   * ascorbic acid  (VITAMIN C  ORAL)   * baclofen  (LIORESAL ) 10 mg tablet   * benztropine  (COGENTIN ) 0.5 mg tablet    Take 1 tablet (0.5 mg total) by mouth nightly.   * BisaCODYL  (DULCOLAX) 10 mg suppository    Insert 10 mg into the rectum daily as needed.   * calcium carbonate-vitamin D3 500 mg (200 mg Ca)-5 mcg (200 units Vit D) per tablet   * diazePAM  (VALIUM ) 5  mg tablet   * docusate sodium  (Colace Clear) 50 mg capsule   * doxylamine-PE-DM-acetaminophen  (Vicks NyQuil Severe Cold-Flu) 6.25-5-10-325 mg tab   * fentaNYL  (DURAGESIC ) 75 mcg/hr patch   * glipiZIDE  (GLUCOTROL ) 5 mg tablet   * HYDROcodone -acetaminophen  (NORCO) 5-325 mg per tablet   * Invega  Sustenna 234 mg/1.5 mL syrg injection    ADMINISTER 1.5 ML IN THE MUSCLE EVERY 21 DAYS   * lactulose  (CHRONULAC ) 10 gram/15 mL solution   * Linzess  290 mcg cap capsule   * lisinopriL  (PRINIVIL ) 10 mg tablet   * memantine  (NAMENDA ) 5 mg tablet    Take 1 tablet (5 mg total) by mouth 2 (two) times a day.   * metoclopramide  (REGLAN ) 10 mg tablet   * metoprolol  tartrate (LOPRESSOR ) 25 mg tablet   * mirtazapine  (REMERON ) 15 mg tablet    TAKE ONE TABLET BY MOUTH AT BEDTIME   * multivitamin-iron-folic acid  (Multi Complete with Iron) 18-400 mg-mcg tab tablet   * NON FORMULARY   * omeprazole 20 mg TbEC   * ondansetron  (ZOFRAN ) 4 mg tablet    Take 4 mg by mouth every 8 (eight) hours as needed for nausea.   * paliperidone  palmitate (INVEGA  SUSTENNA) injection 234 mg    234 mg, intramuscular, Every 28 days, First dose on Mon 01/05/23 at 1545, For 12 doses   * risperiDONE  (RisperDAL ) 0.5 mg tablet    TAKE ONE TABLET (0.5mg ) BY MOUTH TWICE DAILY AS NEEDED   * senna (SENOKOT) 8.6 mg tablet   * traMADoL  (ULTRAM ) 50 mg tablet   * valACYclovir (VALTREX) 1 gram tablet    Take 1,000 mg by mouth 3 (three) times a day for 6 days. Indications: shingles    Ongoing Comment   Glennis CHRISTELLA Mayotte, CPhT    10/16/2023 10:37 PM    DPS declined         Review of Systems    Objective   Temp:  [98.2 F (36.8 C)] 98.2 F (36.8 C) Heart Rate:  [74-106] 100 Resp:  [16-20] 17 BP: (102-153)/(73-97) 125/74 There is no height or weight on file to calculate BMI. No intake or output data in the 24 hours ending 10/17/23 0054  Physical Examination: Physical Exam General Appearance: Normal Vital signs: Within normal  limits HEENT: Within normal limits Respiratory: Within normal limits Gastrointestinal: Abdomen: Distended; protuberant, somewhat tender. Large right hemiabdominal protuberance Skin: Warm and dry, no rash Psychiatric: at times confused  Lines/Drains/Airways: Patient Lines/Drains/Airways Status     Active Peripherally inserted central catheter / Central  venous catheter / Peripheral intravenous line / Drain / Airway / Intraosseous line / Epidural catheter / Arterial line / Line / Nasogastric/Orogastric Tube / Hemodialysis catheter / Umbilical venous catheter / Urethral Catheter / Intrauterine Pressure Catheter / Implantable Port / NHSN Urethral Catheter / NHSN Umbilical Catheter / Intentionally Retained Foreign Objects / AV Fistula / Peripheral Nerve Catheter     Name Placement date Placement time Site Days   Peripheral IV 10/16/23 20 G Right Antecubital 10/16/23  1742  Antecubital  less than 1               Labs and Results  I have reviewed the following labs and results: Results CT scan (10/16/2023): enlarging abdominopelvic cystic masses, with recurrent ascites (though right pelvic cystic mass is largest fluid filled pocket rather than theascites per se  Labs: Results from last 7 days  Lab Units 10/16/23 1717  WHITE BLOOD CELL COUNT 10*3/uL 6.84  HEMOGLOBIN g/dL 86.9  HEMATOCRIT % 60.0  PLATELET COUNT 10*3/uL 281  MEAN CORPUSCULAR VOLUME fL 87.6   Results from last 7 days  Lab Units 10/16/23 1717  SODIUM mmol/L 138  POTASSIUM mmol/L 4.3  CHLORIDE mmol/L 103  CO2 mmol/L 29  ANION GAP mmol/L 6  BUN mg/dL 7  CREATININE mg/dL 9.21  GLUCOSE mg/dL 91  CALCIUM mg/dL 9.1  ALBUMIN  g/dL 3.5  MAGNESIUM  mg/dL 2.2   Results from last 7 days  Lab Units 10/16/23 1717  ALT U/L 19  AST U/L 31  ALP U/L 75  BILIRUBIN TOTAL mg/dL 0.3  ALBUMIN  g/dL 3.5  TOTAL PROTEIN g/dL 6.7       Micro: No results found for this visit on 10/16/23 (from the past week).  Radiology: CT  Abdomen Pelvis W Contrast  Final Result by Harlene Macario Isle, MD (09/26 1854)  CT ABDOMEN PELVIS W CONTRAST, 10/16/2023 6:08 PM    INDICATION: Abdominal pain, acute, nonlocalized   COMPARISON: CT CAP 10/24/2021    TECHNIQUE: CT images of the abdomen and pelvis were obtained after   intravenous administration of iodinated contrast. Conventional axial   reconstructions and multiplanar reformatted images were submitted for   review.     FINDINGS:  .  Lower chest: Trace pericardial effusion.    .  Liver: Redemonstrated left hepatic lobe hemangioma. New subcapsular   cystic implants, for example along the inferior aspect of the left hepatic   lobe and along the hepatic dome. Redemonstrated subcapsular implant along   the posterior aspect of the right hepatic lobe.  .  Gallbladder/biliary: Cholelithiasis. No biliary ductal dilation.    SABRA  Spleen: Within normal limits.  .  Pancreas: Within normal limits.   .  Adrenals: Within normal limits.     .  Kidneys: Mild right hydronephrosis. No left hydronephrosis.  .  Ureters: Mild dilation of the proximal right ureter, which may be   secondary to external compression from the adnexal masses. Unremarkable   left ureter.   .  Bladder: Within normal limits.     .  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 The right adnexal mass   measures 22.2 x 16.1 x 20.1 cm. The left adnexal mass measures 14.2 x 12.5   x 11.5 cm.    .  GI tract: No bowel obstruction.  Normal appendix.    SABRA  Peritoneum/Mesentery/Extraperitoneum: Large volume ascites. Similar   chronic  calcified omental implants in the periumbilical region, right   upper quadrant, and left lower quadrant, poorly evaluated due to ascites.    .  Vascular: Aortobiiliac atherosclerotic calcifications. Chronic   occlusion of the left  common, internal, and external iliac arteries with   distal reconstitution of the left common femoral artery.    .  Musculoskeletal: Polyarticular degenerative changes. No acute osseous   abnormality or aggressive appearing osseous lesion.     IMPRESSION:  1.  Mild right hydroureteronephrosis, which may be secondary to external   compression from the adnexal masses.  2.  Relative to 10/24/2021, interval enlargement of bilateral   multiloculated cystic adnexal masses in keeping with patient's history of   ovarian serious cystadenocarcinoma. New subcapsular hepatic cystic   implants and similar calcified omental implants.  3.  Large volume ascites, likely malignant ascites.  4.  Trace pericardial effusion.  5.  Chronic and ancillary findings as above.       XR Chest 1 View  Final Result by Harlene Macario Isle, MD (09/26 1701)  XR CHEST 1 VIEW, 10/16/2023 4:53 PM    INDICATION:weakness   COMPARISON: Chest radiograph 07/20/2018    FINDINGS:     Supportive devices: None.  Cardiovascular/lungs/pleura: Cardiomediastinal silhouette is within normal   limits. Aortic arch calcifications. Mild bilateral interstitial opacities.   No focal consolidation. No pleural effusion or pneumothorax.  Other: No acute osseous abnormality.    IMPRESSION:  Mild pulmonary interstitial edema.          EKG: Encounter Date: 10/16/23  ECG 12 lead   Narrative   Ventricular Rate                   78        BPM                  Atrial Rate                        78        BPM                  P-R Interval                       162       ms                   QRS Duration                       72        ms                   Q-T Interval                       370       ms                   QTC Calculation Bazett             421       ms                   Calculated P Axis                  47        degrees  Calculated R Axis                  -17       degrees               Calculated T Axis                  36        degrees               Sinus rhythm Low voltage QRS, consider pulmonary disease or obesity Otherwise normal When compared with ECG of 07-Mar-2022 19 30, Nonspecific T wave abnormality no longer evident in anterior leads    Electronically signed by: Signe Dominick Ramus, MD       [1] No past medical history on file. [2] No past surgical history on file. [3] No family history on file. [4] No Known Allergies *Some images could not be shown.

## 2023-10-18 DIAGNOSIS — R9431 Abnormal electrocardiogram [ECG] [EKG]: Secondary | ICD-10-CM | POA: Diagnosis not present

## 2023-10-18 DIAGNOSIS — R0902 Hypoxemia: Secondary | ICD-10-CM | POA: Diagnosis not present

## 2023-10-19 DIAGNOSIS — I509 Heart failure, unspecified: Secondary | ICD-10-CM | POA: Diagnosis not present

## 2023-10-19 DIAGNOSIS — R0902 Hypoxemia: Secondary | ICD-10-CM | POA: Diagnosis not present

## 2023-10-20 DIAGNOSIS — R0902 Hypoxemia: Secondary | ICD-10-CM | POA: Diagnosis not present

## 2023-10-21 DIAGNOSIS — R0902 Hypoxemia: Secondary | ICD-10-CM | POA: Diagnosis not present

## 2023-10-21 NOTE — Discharge Summary (Addendum)
 Hospital Medicine Discharge Summary   Demographics: Kristen Colon  73 y.o. 12/13/1950 MRN: [id number]    Extended Emergency Contact Information Primary Emergency Contact: Oconnor,[name] Work Phone: [phone] Mobile Phone: [phone] Relation: Daughter  DNAR / Limited SOTO  Admit Date: 10/16/2023                            Attending Physician: Jennings Graciela Keeler* Discharge Date: 10/21/2023  Primary Care Provider: Nancyann Dallas Newcomer, MD (Inactive)   None  Consults during this admission: Consult Orders             IP CONSULT TO HOSPITALIST       Provider:  (Not yet assigned)      IP CONSULT TO HOSPITALIST       Provider:  (Not yet assigned)              Active & Resolved Diagnosis: Principal Problem:   Malignant ascites Resolved Problems:   * No resolved hospital problems. *   Disposition: Patient discharged to SNF / Short term Rehab in fair condition.   Discharge follow-up recommendations : See Hospital Course    Hospital Course: Kristen Colon is a 73 y.o. female with a PMH significant for paranoid schizophrenia, dementia, history of DVT on anticoagulant with apixaban , hypertension, history of metastatic ovarian cancer who presented for generalized weakness.    Daughter continues to refuse hospice care.  Principal Problem:   Malignant ascites Resolved Problems:   * No resolved hospital problems. *     Acute Hypoxia, resolved Possible CHF Pt with new O2 requirement to 3L [place] since admission. Admission CXR with mild pulmonary edema. No history CHF,  9/29: Pt saturating well on RA today after diuresis yesterday.  - TTE revealed trace TR and MR - Has continued to saturate in the upper 90s on room air for over 48 hours now      Metastatic Ovarian Cancer FTT Constipation, resolved Pt with known metastatic ovarian cancer with peritoneal disease, malignant ascites. Increasing weakness and inability to ambulate. Had been on home hospice but  daughter could not care for pt anymore. Interested in hospice home vs SNF placement. CT abdomen on admission without bowel obstruction. No BM x10 days.  - gets monthly paracenteses, last 10/13/23 9/28: Pt with large BM yesterday. Abd pain improved today.  Plan: - bowel regimen - Analgesic regimen as outlined below -PT OT recommends acute rehab, appears there are insurance issues here   Chronic Conditions: DVT:  home eliquis  Schizophrenia: namenda , remeron , benzotropine GERD: PPI       On the day of discharge patient had no complaints. She was found lying in bed, left/prone position Normal work of breathing, CTAB.  Care discussed with daughter over the phone.  Okay for discharge.   Prior to this admission patient was getting hospice care at home, but her daughter brought her in pretty much because she could not care for her anymore, so the plan at this point is discharge to SNF with hospice at the facility.        Wound / Incision Assessment: Refer to Chart Review and Media Tab for images if available.      Temp:  [97.5 F (36.4 C)-98.9 F (37.2 C)] 97.5 F (36.4 C) Heart Rate:  [77-107] 84 Resp:  [16-18] 18 BP: (116-148)/(43-70) 116/43  Anticoagulant Medications     Direct Factor Xa Inhibitors Start End   * apixaban  (ELIQUIS ) 5 mg  tab  --   Class: Historical Med         Discharge Medications     PAUSE taking these medications      Sig Disp Refill Start End  amLODIPine  5 mg tablet Wait to take this until your doctor or other care provider tells you to start again. Commonly known as: NORVASC   Take 5 mg by mouth daily.   0         New Medications      Sig Disp Refill Start End  polyethylene glycol 17 gram packet Commonly known as: GLYCOLAX   Take 17 g by mouth 2 (two) times a day for 14 days. Hold if having liquid bowel movements  476 g  0         Medications To Continue      Sig Disp Refill Start End  acetaminophen  500 mg  tablet Commonly known as: TYLENOL   Take 500 mg by mouth every 6 (six) hours as needed for fever 100.4 F or GREATER (pain).   0     alum-mag hydroxide-simethicone  200-200-20 mg/5 mL Susp suspension Commonly known as: MAALOX, MYLANTA  Take 30 mL by mouth every 4 (four) hours as needed.  355 mL  0     apixaban  5 mg Tab Commonly known as: ELIQUIS   Take 5 mg by mouth 2 (two) times a day.   0     baclofen  10 mg tablet Commonly known as: LIORESAL   Take 10 mg by mouth daily as needed.   0     benztropine  0.5 mg tablet Commonly known as: COGENTIN   Take 1 tablet (0.5 mg total) by mouth nightly.  90 tablet  3     BisaCODYL  10 mg suppository Commonly known as: DULCOLAX  Insert 10 mg into the rectum daily as needed.  30 suppository  1     calcium carbonate-vitamin D3 500 mg (200 mg Ca)-5 mcg (200 units Vit D) per tablet  Take 1 tablet by mouth daily.   0     diazePAM  5 mg tablet Commonly known as: VALIUM   Take 5 mg by mouth daily.   0     fentaNYL  75 mcg/hr patch Commonly known as: DURAGESIC   Place 1 patch on the skin every third day.   0     glipiZIDE  5 mg tablet Commonly known as: GLUCOTROL   Take 5 mg by mouth 2 (two) times a day.   0     Invega  Sustenna 234 mg/1.5 mL Syrg injection Generic drug: paliperidone  palmitate  ADMINISTER 1.5 ML IN THE MUSCLE EVERY 21 DAYS  1.5 mL  11     lactulose  10 gram/15 mL solution Commonly known as: CHRONULAC   Take 10 g by mouth 2 (two) times a day.   1     memantine  5 mg tablet Commonly known as: NAMENDA   Take 1 tablet (5 mg total) by mouth 2 (two) times a day(morning and bedtime) (Take 1 tablet (5 mg total) by mouth 2 (two) times a day.)  180 tablet  3     metoprolol  tartrate 25 mg tablet Commonly known as: LOPRESSOR   Take 0.5 tablets by mouth 2 (two) times a day.   0     mirtazapine  15 mg tablet Commonly known as: REMERON   TAKE ONE TABLET BY MOUTH AT BEDTIME  90 tablet  3     Multi Complete with Iron 18-400  mg-mcg Tab tablet Generic drug: multivitamin-iron-folic acid   Take 1 tablet by mouth daily.   0  NON FORMULARY  Take 1 capsule by mouth daily. Lions mane   0     omeprazole 20 mg Tbec  Take 1 tablet by mouth daily.   0     ondansetron  4 mg tablet Commonly known as: ZOFRAN   Take 4 mg by mouth every 8 (eight) hours as needed for nausea.  30 tablet  1     risperiDONE  0.5 mg tablet Commonly known as: RisperDAL   TAKE ONE TABLET (0.5mg ) BY MOUTH TWICE DAILY AS NEEDED  180 tablet  3     valACYclovir 1 gram tablet Commonly known as: VALTREX  Take 1,000 mg by mouth 3 (three) times a day for 6 days. Indications: shingles  18 tablet  0     Vicks NyQuil Severe Cold-Flu 6.25-5-10-325 mg Tab Generic drug: doxylamine-PE-DM-acetaminophen   Take 5 mL by mouth every 12 (twelve) hours as needed.   0     VITAMIN C  ORAL  Take 1 tablet by mouth daily.   0         Stopped Medications    Colace Clear 50 mg capsule Generic drug: docusate sodium    HYDROcodone -acetaminophen  5-325 mg per tablet Commonly known as: NORCO   Linzess  290 mcg Cap capsule Generic drug: linaCLOtide    lisinopriL  10 mg tablet Commonly known as: PRINIVIL    metoclopramide  10 mg tablet Commonly known as: REGLAN    senna 8.6 mg tablet Commonly known as: SENOKOT   traMADoL  50 mg tablet Commonly known as: ULTRAM          Occupational Therapy Recommendations: Rehab Potential: Good Complexity/Co-morbidities that Impact POC: Pulmonary compromise, Reduced activity level, Cardiac compromise, Cognitive/Memory deficits Impairments/Limitations: Safety Awareness deficits, Activity Tolerance Deficits, Balance Deficits, Basic Activity of Daily living deficits, Cognitive Deficits, Muscle weakness, Decreased knowledge of condition   Physical Therapy Recommendations: Rehabilitation Facility     Lab Results  Component Value Date/Time   HGB 11.8 (L) 10/21/2023 07:55 AM   HCT 35.6 (L) 10/21/2023 07:55 AM    WBC 6.43 10/21/2023 07:55 AM   PLT 267 10/21/2023 07:55 AM   Lab Results  Component Value Date/Time   NA 141 10/21/2023 07:55 AM   K 4.3 10/21/2023 07:55 AM   CREATININE 0.59 (L) 10/21/2023 07:55 AM   BUN 13 10/21/2023 07:55 AM   GLUCOSE 94 10/21/2023 07:55 AM    Pertinent Imaging: CT Abdomen Pelvis W Contrast  Final Result by Harlene Macario Isle, MD (09/26 1854)  CT ABDOMEN PELVIS W CONTRAST, 10/16/2023 6:08 PM    INDICATION: Abdominal pain, acute, nonlocalized   COMPARISON: CT CAP 10/24/2021    TECHNIQUE: CT images of the abdomen and pelvis were obtained after   intravenous administration of iodinated contrast. Conventional axial   reconstructions and multiplanar reformatted images were submitted for   review.     FINDINGS:  .  Lower chest: Trace pericardial effusion.    .  Liver: Redemonstrated left hepatic lobe hemangioma. New subcapsular   cystic implants, for example along the inferior aspect of the left hepatic   lobe and along the hepatic dome. Redemonstrated subcapsular implant along   the posterior aspect of the right hepatic lobe.  .  Gallbladder/biliary: Cholelithiasis. No biliary ductal dilation.    SABRA  Spleen: Within normal limits.  .  Pancreas: Within normal limits.   .  Adrenals: Within normal limits.     .  Kidneys: Mild right hydronephrosis. No left hydronephrosis.  .  Ureters: Mild dilation of the proximal right ureter, which may be   secondary to  external compression from the adnexal masses. Unremarkable   left ureter.   .  Bladder: Within normal limits.     .  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 The right adnexal mass   measures 22.2 x 16.1 x 20.1 cm. The left adnexal mass measures 14.2 x 12.5   x 11.5 cm.    .  GI tract: No bowel obstruction.  Normal appendix.    SABRA   Peritoneum/Mesentery/Extraperitoneum: Large volume ascites. Similar   chronic calcified omental implants in the periumbilical region, right   upper quadrant, and left lower quadrant, poorly evaluated due to ascites.    .  Vascular: Aortobiiliac atherosclerotic calcifications. Chronic   occlusion of the left common, internal, and external iliac arteries with   distal reconstitution of the left common femoral artery.    .  Musculoskeletal: Polyarticular degenerative changes. No acute osseous   abnormality or aggressive appearing osseous lesion.     IMPRESSION:  1.  Mild right hydroureteronephrosis, which may be secondary to external   compression from the adnexal masses.  2.  Relative to 10/24/2021, interval enlargement of bilateral   multiloculated cystic adnexal masses in keeping with patient's history of   ovarian serious cystadenocarcinoma. New subcapsular hepatic cystic   implants and similar calcified omental implants.  3.  Large volume ascites, likely malignant ascites.  4.  Trace pericardial effusion.  5.  Chronic and ancillary findings as above.       XR Chest 1 View  Final Result by Harlene Macario Isle, MD (09/26 1701)  XR CHEST 1 VIEW, 10/16/2023 4:53 PM    INDICATION:weakness   COMPARISON: Chest radiograph 07/20/2018    FINDINGS:     Supportive devices: None.  Cardiovascular/lungs/pleura: Cardiomediastinal silhouette is within normal   limits. Aortic arch calcifications. Mild bilateral interstitial opacities.   No focal consolidation. No pleural effusion or pneumothorax.  Other: No acute osseous abnormality.    IMPRESSION:  Mild pulmonary interstitial edema.          Electronically signed by: Jennings Graciela Keeler, MD 10/21/2023 1:25 PM   Time spent on discharge: 35 minutes  *Some images could not be shown.

## 2023-10-22 DIAGNOSIS — I10 Essential (primary) hypertension: Secondary | ICD-10-CM | POA: Diagnosis not present

## 2023-10-22 DIAGNOSIS — R410 Disorientation, unspecified: Secondary | ICD-10-CM | POA: Diagnosis not present

## 2023-10-22 DIAGNOSIS — C569 Malignant neoplasm of unspecified ovary: Secondary | ICD-10-CM | POA: Diagnosis not present

## 2023-10-22 DIAGNOSIS — C786 Secondary malignant neoplasm of retroperitoneum and peritoneum: Secondary | ICD-10-CM | POA: Diagnosis not present

## 2023-10-22 DIAGNOSIS — G309 Alzheimer's disease, unspecified: Secondary | ICD-10-CM | POA: Diagnosis not present

## 2023-10-22 DIAGNOSIS — F411 Generalized anxiety disorder: Secondary | ICD-10-CM | POA: Diagnosis not present

## 2023-10-22 DIAGNOSIS — Z86718 Personal history of other venous thrombosis and embolism: Secondary | ICD-10-CM | POA: Diagnosis not present

## 2023-10-22 DIAGNOSIS — R531 Weakness: Secondary | ICD-10-CM | POA: Diagnosis not present

## 2023-10-22 DIAGNOSIS — F200 Paranoid schizophrenia: Secondary | ICD-10-CM | POA: Diagnosis not present

## 2023-10-23 DIAGNOSIS — F200 Paranoid schizophrenia: Secondary | ICD-10-CM | POA: Diagnosis not present

## 2023-10-23 DIAGNOSIS — C569 Malignant neoplasm of unspecified ovary: Secondary | ICD-10-CM | POA: Diagnosis not present

## 2023-10-27 DIAGNOSIS — N390 Urinary tract infection, site not specified: Secondary | ICD-10-CM | POA: Diagnosis not present

## 2023-11-09 NOTE — Progress Notes (Signed)
    PATIENT: Kristen Colon, Kristen Colon     FIN #: [id number]  MRN:  [id number] PROVIDER Name:        NNAMDI CHUKWUMA MADUABUM MD     PHYSICIAN RESPONSE: The patient had Acute HFpEF DOCUMENTATION CLARIFICATION:  The diagnosis of hypoxia with mild pulmonary edema and possible CHF is documented in the medical record. This diagnosis requires further specificity/clarification. Such as:  The patient had Acute HFpEF The patient had Acute pulmonary edema Both Acute CHF and Acute pulmonary edema ruled out. Other, please specify   The patient's Clinical Indicators include: PN 9/28: Acute Hypoxia Possible CHF Pt with new O2 requirement to 3L [place] since admission. Admission CXR with mild pulmonary edema. No history CHF, could consider HFpEF. Plan: - IV lasix  40 (once on 9/28) - TTE - monitor on telemetry - K>4, Mg>2 - strict intake/output, daily standing weights - daily CBC, BMP, Mg  Discharge Summary: Acute Hypoxia, resolved Possible CHF Pt with new O2 requirement to 3L [place] since admission. Admission CXR with mild pulmonary edema. No history CHF, 9/29: Pt saturating well on RA today after diuresis yesterday. - TTE revealed trace TR and MR - Has continued to saturate in the upper 90s on room air for over 48 hours now  MRN: [id number]   Clarification initiated by: Aldean Sawyer on 11/06/2023 8:19 AM  Disclaimer: The purpose of this clarification is to ensure the accuracy and integrity of the documentation, and resultant codes, where this is conflicting, ambiguous or incomplete information, or clinical evidence for a higher degree of specificity or severity. In addition to clinical responses provided, providers are also presented with free-text and unable to determine response options.    Electronically signed by:  JENNINGS GRACIELA KEELER MD 11/09/2023 4:26 PM

## 2023-11-11 ENCOUNTER — Encounter: Payer: Self-pay | Admitting: Family Medicine

## 2023-11-11 ENCOUNTER — Ambulatory Visit: Admitting: Family Medicine

## 2023-11-11 VITALS — BP 150/73 | HR 67 | Ht 62.0 in | Wt 143.0 lb

## 2023-11-11 DIAGNOSIS — C7960 Secondary malignant neoplasm of unspecified ovary: Secondary | ICD-10-CM

## 2023-11-11 DIAGNOSIS — Z7689 Persons encountering health services in other specified circumstances: Secondary | ICD-10-CM

## 2023-11-11 DIAGNOSIS — F203 Undifferentiated schizophrenia: Secondary | ICD-10-CM

## 2023-11-11 DIAGNOSIS — C569 Malignant neoplasm of unspecified ovary: Secondary | ICD-10-CM

## 2023-11-11 DIAGNOSIS — R180 Malignant ascites: Secondary | ICD-10-CM

## 2023-11-11 DIAGNOSIS — R188 Other ascites: Secondary | ICD-10-CM | POA: Diagnosis not present

## 2023-11-11 MED ORDER — INVEGA SUSTENNA 234 MG/1.5ML IM SUSY: 234.0000 mg | PREFILLED_SYRINGE | INTRAMUSCULAR | 5 refills | Status: AC

## 2023-11-11 NOTE — Patient Instructions (Signed)
 It was nice to see you today,  We addressed the following topics today: - An order for a paracentesis has been sent to radiology for today. Please follow up with them regarding the appointment time. - the invega  at [facility] on [address]  is still there for you to pick up. - Once you pick up the Invega  injection, you can bring it to our office and we will administer it for her. We can do this for future doses as well. - I have placed a referral for palliative care with [facility]. - I am also placing referrals for an eye doctor and for physical therapy to be done at home. - Please bring in the full list of her medications at your next visit so I can review it.  Have a great day,  Rolan Slain, MD
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
## 2023-11-11 NOTE — Assessment & Plan Note (Signed)
-   Likely secondary to underlying malignancy. Reports increased weight, shortness of breath, and abdominal distention. Last paracentesis was >1 month ago. - Plan: - STAT order for therapeutic paracentesis today at Radiology. - Referral to Oncology for ongoing management of paracentesis.

## 2023-11-11 NOTE — Assessment & Plan Note (Signed)
-   Longstanding history, managed with monthly Invega  injection. Currently overdue for injection, contributing to worsening cognitive and behavioral status. - Plan: - Verify status of Invega  injection dispensed on 10/31/2023 from [facility]). - If available, have caregiver bring injection to clinic for administration. Will plan for our office to administer future injections. - Continue psychiatric care with current psychiatrist via telehealth.

## 2023-11-12 ENCOUNTER — Ambulatory Visit (HOSPITAL_COMMUNITY)
Admission: RE | Admit: 2023-11-12 | Discharge: 2023-11-12 | Disposition: A | Discharge status: Home / Self Care | Source: Ambulatory Visit | Attending: Family Medicine | Admitting: Family Medicine

## 2023-11-12 DIAGNOSIS — C569 Malignant neoplasm of unspecified ovary: Secondary | ICD-10-CM | POA: Insufficient documentation

## 2023-11-12 DIAGNOSIS — R180 Malignant ascites: Secondary | ICD-10-CM | POA: Insufficient documentation

## 2023-11-12 HISTORY — PX: IR PARACENTESIS: IMG2679

## 2023-11-12 MED ORDER — LIDOCAINE-EPINEPHRINE 1 %-1:100000 IJ SOLN
INTRAMUSCULAR | Status: AC
  Filled 2023-11-12: qty 1

## 2023-11-12 MED ORDER — LIDOCAINE-EPINEPHRINE 1 %-1:100000 IJ SOLN
20.0000 mL | Freq: Once | INTRAMUSCULAR | Status: AC
  Administered 2023-11-12: 10 mL

## 2023-11-12 NOTE — Procedures (Signed)
 PROCEDURE SUMMARY:  Successful image-guided paracentesis from the left lower abdomen.  Yielded 3.6 liters of dark amber fluid.  No immediate complications.  EBL: trace Patient tolerated well.   Specimen not sent for labs.  Please see imaging section of Epic for full dictation.  Kimble DEL Cache Decoursey PA-C 11/12/2023 10:56 AM

## 2023-11-13 ENCOUNTER — Ambulatory Visit (INDEPENDENT_AMBULATORY_CARE_PROVIDER_SITE_OTHER): Admitting: Family Medicine

## 2023-11-13 VITALS — BP 116/74 | HR 59 | Temp 98.8°F | Ht 62.0 in | Wt 134.1 lb

## 2023-11-13 DIAGNOSIS — F259 Schizoaffective disorder, unspecified: Secondary | ICD-10-CM

## 2023-11-17 ENCOUNTER — Encounter: Payer: Self-pay | Admitting: Family Medicine

## 2023-11-17 MED ORDER — PALIPERIDONE PALMITATE ER 234 MG/1.5ML IM SUSY
234.0000 mg | PREFILLED_SYRINGE | Freq: Once | INTRAMUSCULAR | Status: AC
  Administered 2023-11-13: 234 mg via INTRAMUSCULAR

## 2023-11-17 NOTE — Progress Notes (Signed)
 Patient was here for Invega  Injection. Patient tolerated the injection with no concerns.

## 2023-11-20 ENCOUNTER — Telehealth: Payer: Self-pay | Admitting: Family Medicine

## 2023-11-20 NOTE — Telephone Encounter (Signed)
 Everything has been faxed

## 2023-11-20 NOTE — Telephone Encounter (Signed)
 Can you fax [facility] hosp[facility] agency in [place] for a records request?  She was in hospice for several years up until last month.    Can you also request records including medications given from [facility]? She was discharged there after her recent hospital stay

## 2023-11-25 ENCOUNTER — Telehealth: Payer: Self-pay

## 2023-11-25 ENCOUNTER — Telehealth: Payer: Self-pay | Admitting: Family Medicine

## 2023-11-25 NOTE — Telephone Encounter (Signed)
 Copied from CRM #[id number]. Topic: Clinical - Home Health Verbal Orders >> Nov 25, 2023 10:24 AM Tobias CROME wrote: Caller/Agency: Rocky Erline [facility] Callback Number: [phone] Service Requested: Physical Therapy Frequency: 1x9  Any new concerns about the patient? No

## 2023-11-25 NOTE — Telephone Encounter (Signed)
 Copied from CRM [id number]. Topic: Clinical - Medication Refill >> Nov 25, 2023  2:01 PM Tobias L wrote: Medication: Christa with [facility] calling to follow up on request for medications. Sent fax request to office on 10/27 and 10/28.   Total of 13 medications: benztropine  (COGENTIN ) 0.5 MG tablet Tramadol  50mg  tablet Diazepam  5 mg Amlodipine  5mg  Baclofen  10mg  buPROPion  (WELLBUTRIN  XL) 150 MG 24 hr tablet Glipizide  5mg  paliperidone  (INVEGA  SUSTENNA) 234 MG/1.5ML injection Linzess  290 mcg memantine  (NAMENDA ) 5 MG tablet metoprolol  tartrate (LOPRESSOR ) 25 MG tablet Omeprazole 20mg  Lactulose  10g per   Has the patient contacted their pharmacy? Yes Pharmacy calling on behalf of patient.   [facility] - [place], [place] - [address] [place] [address] Phone: [phone] Fax: [phone]  Is this the correct pharmacy for this prescription? Yes  Has the prescription been filled recently? No  Is the patient out of the medication? Unsure, pharmacy calling on behalf of patient.   Has the patient been seen for an appointment in the last year OR does the patient have an upcoming appointment? Yes  Can we respond through MyChart? No  Agent: Please be advised that Rx refills may take up to 3 business days. We ask that you follow-up with your pharmacy.

## 2023-11-25 NOTE — Telephone Encounter (Signed)
 Called [facility] gave verbal order Per Saddie

## 2023-11-25 NOTE — Telephone Encounter (Signed)
 Ok to give verbal

## 2023-11-25 NOTE — Telephone Encounter (Signed)
 Copied from CRM [id number]. Topic: Clinical - Medication Refill >> Nov 25, 2023  2:01 PM Tobias L wrote: Medication: Christa with [facility] calling to follow up on request for medications. Sent fax request to office on 10/27 and 10/28.    Total of 13 medications: benztropine  (COGENTIN ) 0.5 MG tablet Tramadol  50mg  tablet Diazepam  5 mg Amlodipine  5mg  Baclofen  10mg  buPROPion  (WELLBUTRIN  XL) 150 MG 24 hr tablet Glipizide  5mg  paliperidone  (INVEGA  SUSTENNA) 234 MG/1.5ML injection Linzess  290 mcg memantine  (NAMENDA ) 5 MG tablet metoprolol  tartrate (LOPRESSOR ) 25 MG tablet Omeprazole 20mg  Lactulose  10g per    Has the patient contacted their pharmacy? Yes Pharmacy calling on behalf of patient.    [facility] - [place], [place] - [address] [place] [address] Phone: [phone] Fax: [phone]

## 2023-11-30 ENCOUNTER — Other Ambulatory Visit: Payer: Self-pay

## 2023-12-01 ENCOUNTER — Telehealth: Payer: Self-pay

## 2023-12-01 NOTE — Telephone Encounter (Signed)
 Copied from CRM [id number]. Topic: Clinical - Request for Lab/Test Order >> Dec 01, 2023  3:36 PM Darshell M wrote: Reason for CRM: Patient requesting standing orders or for paracentesis.

## 2023-12-03 ENCOUNTER — Other Ambulatory Visit: Payer: Self-pay | Admitting: Family Medicine

## 2023-12-03 MED ORDER — BUPROPION HCL ER (XL) 150 MG PO TB24: 150.0000 mg | ORAL_TABLET | Freq: Every day | ORAL | 3 refills | Status: AC

## 2023-12-03 MED ORDER — OMEPRAZOLE 20 MG PO CPDR
20.0000 mg | DELAYED_RELEASE_CAPSULE | Freq: Every day | ORAL | 3 refills | Status: AC
Start: 2023-12-03 — End: ?

## 2023-12-03 MED ORDER — LACTULOSE 10 GM/15ML PO SOLN: 10.0000 g | Freq: Two times a day (BID) | ORAL | 3 refills | Status: AC | PRN

## 2023-12-03 MED ORDER — BENZTROPINE MESYLATE 0.5 MG PO TABS: 0.5000 mg | ORAL_TABLET | Freq: Every day | ORAL | 3 refills | Status: AC

## 2023-12-03 MED ORDER — METOPROLOL TARTRATE 25 MG PO TABS: 12.5000 mg | ORAL_TABLET | Freq: Two times a day (BID) | ORAL | 3 refills | Status: AC

## 2023-12-03 MED ORDER — LINACLOTIDE 290 MCG PO CAPS: 290.0000 ug | ORAL_CAPSULE | Freq: Every day | ORAL | 3 refills | Status: AC

## 2023-12-03 MED ORDER — AMLODIPINE BESYLATE 5 MG PO TABS: 5.0000 mg | ORAL_TABLET | Freq: Every day | ORAL | 3 refills | Status: AC

## 2023-12-03 MED ORDER — DIAZEPAM 5 MG PO TABS: 5.0000 mg | ORAL_TABLET | Freq: Every day | ORAL | 3 refills | Status: AC

## 2023-12-03 MED ORDER — MEMANTINE HCL 5 MG PO TABS: 5.0000 mg | ORAL_TABLET | Freq: Two times a day (BID) | ORAL | 3 refills | Status: AC

## 2023-12-03 MED ORDER — GLIPIZIDE 5 MG PO TABS: 5.0000 mg | ORAL_TABLET | Freq: Every day | ORAL | 3 refills | Status: AC

## 2023-12-03 NOTE — Telephone Encounter (Signed)
 Medications have been sent in to [facility]

## 2023-12-03 NOTE — Telephone Encounter (Signed)
 Called radiology LVM to contact the office for ordering Paracentesis IR

## 2023-12-03 NOTE — Telephone Encounter (Signed)
 I can't see a way to order standing paracentesis orders on my end.  Can you call the radiology dept at [facility] and see if that is something that we can even order?

## 2023-12-07 ENCOUNTER — Telehealth: Payer: Self-pay

## 2023-12-07 NOTE — Telephone Encounter (Signed)
 Copied from CRM #[id number]. Topic: Referral - Question >> Dec 07, 2023 10:12 AM Tinnie BROCKS wrote: Reason for CRM: [facility] reaching out to let us  know that they received palliative care order, but linda (joannas daughter) thinks the support provided by hospice may be better due to her needing someone to get medications and incontinence supplies. [facility] told her they would not be able to do things for her and the rep says that is the most important need for her right now. They also say they understand she can't be on home health and hospice at the same time. Requesting call back at [phone]   Her name is Bage at [facility] Ph#[phone] if any questions. She is requesting a call as well to clarify if she needs to close referral.

## 2023-12-08 ENCOUNTER — Other Ambulatory Visit: Payer: Self-pay | Admitting: Family Medicine

## 2023-12-08 ENCOUNTER — Other Ambulatory Visit (HOSPITAL_COMMUNITY): Payer: Self-pay | Admitting: Internal Medicine

## 2023-12-08 ENCOUNTER — Other Ambulatory Visit (HOSPITAL_COMMUNITY): Payer: Self-pay | Admitting: Family Medicine

## 2023-12-08 ENCOUNTER — Other Ambulatory Visit: Payer: Self-pay

## 2023-12-08 ENCOUNTER — Ambulatory Visit: Admitting: Family Medicine

## 2023-12-08 DIAGNOSIS — R180 Malignant ascites: Secondary | ICD-10-CM

## 2023-12-08 NOTE — Telephone Encounter (Signed)
 I called and was able to get the paracenteses ordered

## 2023-12-10 ENCOUNTER — Ambulatory Visit: Admitting: Family Medicine

## 2023-12-11 ENCOUNTER — Ambulatory Visit (HOSPITAL_COMMUNITY)
Admission: RE | Admit: 2023-12-11 | Discharge: 2023-12-11 | Disposition: A | Discharge status: Home / Self Care | Source: Ambulatory Visit | Attending: Family Medicine | Admitting: Family Medicine

## 2023-12-11 DIAGNOSIS — R188 Other ascites: Secondary | ICD-10-CM | POA: Diagnosis not present

## 2023-12-11 DIAGNOSIS — R180 Malignant ascites: Secondary | ICD-10-CM

## 2023-12-11 HISTORY — PX: IR PARACENTESIS: IMG2679

## 2023-12-11 MED ORDER — LIDOCAINE-EPINEPHRINE 1 %-1:100000 IJ SOLN: 20.0000 mL | Freq: Once | INTRAMUSCULAR | Status: DC

## 2023-12-11 MED ORDER — LIDOCAINE-EPINEPHRINE 1 %-1:100000 IJ SOLN
INTRAMUSCULAR | Status: AC
  Filled 2023-12-11: qty 1

## 2023-12-11 NOTE — Procedures (Signed)
 PROCEDURE SUMMARY:  Successful image-guided paracentesis from the left lower abdomen.  Yielded 5 liters of blood tinged fluid.  No immediate complications.  EBL: trace Patient tolerated well.   Specimen not sent for labs.  Please see imaging section of Epic for full dictation.  Kimble DEL Chilton Sallade PA-C 12/11/2023 12:44 PM

## 2023-12-11 NOTE — Telephone Encounter (Signed)
 Called Bage at [facility] she said that she would reach back out to the daughter to get some type of progress

## 2023-12-11 NOTE — Telephone Encounter (Signed)
 Please let her know that the pt has missed her last few appointments so I have not been able to discuss this with them yet.

## 2023-12-15 ENCOUNTER — Ambulatory Visit: Admitting: Family Medicine

## 2023-12-15 ENCOUNTER — Encounter: Payer: Self-pay | Admitting: Family Medicine

## 2023-12-15 VITALS — BP 101/63 | HR 84 | Ht 62.0 in | Wt 137.0 lb

## 2023-12-15 DIAGNOSIS — K5904 Chronic idiopathic constipation: Secondary | ICD-10-CM | POA: Diagnosis not present

## 2023-12-15 DIAGNOSIS — F203 Undifferentiated schizophrenia: Secondary | ICD-10-CM | POA: Diagnosis not present

## 2023-12-15 DIAGNOSIS — Z9981 Dependence on supplemental oxygen: Secondary | ICD-10-CM | POA: Diagnosis not present

## 2023-12-15 DIAGNOSIS — J9611 Chronic respiratory failure with hypoxia: Secondary | ICD-10-CM | POA: Diagnosis not present

## 2023-12-15 DIAGNOSIS — F03B0 Unspecified dementia, moderate, without behavioral disturbance, psychotic disturbance, mood disturbance, and anxiety: Secondary | ICD-10-CM | POA: Diagnosis not present

## 2023-12-15 DIAGNOSIS — J432 Centrilobular emphysema: Secondary | ICD-10-CM

## 2023-12-15 DIAGNOSIS — C569 Malignant neoplasm of unspecified ovary: Secondary | ICD-10-CM | POA: Diagnosis not present

## 2023-12-15 DIAGNOSIS — E119 Type 2 diabetes mellitus without complications: Secondary | ICD-10-CM | POA: Diagnosis not present

## 2023-12-15 DIAGNOSIS — R180 Malignant ascites: Secondary | ICD-10-CM

## 2023-12-15 MED ORDER — PALIPERIDONE PALMITATE ER 234 MG/1.5ML IM SUSY
234.0000 mg | PREFILLED_SYRINGE | Freq: Once | INTRAMUSCULAR | Status: AC
  Administered 2023-12-15: 234 mg via INTRAMUSCULAR

## 2023-12-15 NOTE — Progress Notes (Unsigned)
 Established Patient Office Visit  Subjective   Patient ID: Kristen Colon, female    DOB: 17-Oct-1950  Age: 73 y.o. MRN: [id number]  Chief Complaint  Patient presents with   Medical Management of Chronic Issues    HPI  Oxygen  Subjective - Follow-up for management of ascites and multiple chronic conditions. Caregiver reports rapid reaccumulation of ascitic fluid following paracentesis on 12/11/2023. - Reports increased pain and unusual breathing pattern, with O2 saturation of 93%. Requests home oxygen and nebulizer treatments, similar to what was provided under previous hospice care. Patient reportedly uses oxygen at night. Caregiver notes increased respiratory distress between paracenteses. - Reports constipation. Docusate is ineffective. Lactulose  was not refilled by [facility] and was also reportedly not very effective in the past. - Caregiver reports patient is running low on lisinopril , with a 10-day supply remaining. Also requests a refill for mirtazapine , which is used for appetite and sleep. - Reports worsening cognitive and behavioral symptoms, including waking at 3 a.m. and dressing inappropriately. Reports significant caregiver stress. - Caregiver requests a letter to allow her to be present during paracentesis procedures due to the patient's cognitive impairment. - Caregiver requests a referral to neurology for re-evaluation, last seen in 2015. Interested in new treatments for Alzheimer's and a repeat brain MRI to compare with the 2018 scan. - Caregiver requests a referral back to oncology for re-evaluation.  Medications Current medications discussed include lisinopril , omeprazole , metoprolol , Invega  (injection today), wellbutrin , trazodone  (PRN), risperidone  (PRN, has supply), mirtazapine  (needs refill), docusate (ineffective), lactulose  (needs refill). Caregiver is concerned several medications are missing from the pharmacy list, including lisinopril . A large refill  order was sent to [facility] pharmacy on 12/03/2023 which should include most medications.  PMH, PSH, FH, Social Hx PMHx: Advanced cancer (unspecified), ascites, schizophrenia, probable Alzheimer's disease, hypertension, history of small bowel obstruction (resolved with NG tube decompression). Previously on hospice, now disenrolled. Receiving home physical therapy. PSH: Paracentesis on 11/12/2023 and 12/11/2023. Social Hx: Lives with daughter who is primary caregiver.  ROS Constitutional: Reports fatigue, pain. Respiratory: Reports dyspnea, shallow breathing. GI: Reports constipation, no vomiting. Good appetite with mirtazapine . Psychiatric: Reports worsening confusion, agitation, sleep disturbance.    Oncology follow-up Patient has not seen oncology since Dr. Wadie relocated. - Will provide a referral back to [facility] oncology for continuity of care.    The ASCVD Risk score (Arnett DK, et al., 2019) failed to calculate for the following reasons:   Cannot find a previous HDL lab   Cannot find a previous total cholesterol lab  Health Maintenance Due  Topic Date Due   FOOT EXAM  Never done   OPHTHALMOLOGY EXAM  Never done   Hepatitis C Screening  Never done   DTaP/Tdap/Td (1 - Tdap) Never done   Zoster Vaccines- Shingrix (1 of 2) Never done   Mammogram  Never done   Colonoscopy  Never done   Bone Density Scan  Never done   Pneumococcal Vaccine: 50+ Years (2 of 2 - PCV) 09/21/2017   COVID-19 Vaccine (3 - Pfizer risk series) 08/09/2019   Influenza Vaccine  08/21/2023      Objective:     BP 101/63   Pulse 84   Ht 5' 2 (1.575 m)   Wt 137 lb (62.1 kg)   LMP  (LMP Unknown) Comment: years ago per Pt  SpO2 93%   BMI 25.06 kg/m  {Vitals History (Optional):23777}  Physical Exam Gen: alert.  Daughter provides most of the history. Pt does not  speak often during encounter.   Pulm: no respiratory distress Gi: distended abdomen Psych: pleasant affect   No results found for  any visits on 12/15/23.      Assessment & Plan:   Schizophrenia, undifferentiated (HCC) Assessment & Plan: - Administered Invega  injection in office today. - Continue to see psychiatry with Dr. Landy for management of schizophrenia. - sending in mirtazapine    Orders: -     Paliperidone  Palmitate ER  Malignant ascites (HCC) Assessment & Plan: Patient has progressive ascites requiring paracentesis approximately every month, with 5L removed most recently on 12/11/2023. Fluid is reaccumulating quickly. Patient is symptomatic with dyspnea. - Continue PRN paracentesis up to 5L. 5 orders have been placed for future procedures. - Will provide a letter to caregiver supporting her presence during procedures due to patient's cognitive status.   Chronic hypoxic respiratory failure, on home oxygen therapy [facility]) Assessment & Plan: Patient has O2 saturation of 93% and caregiver reports increased work of breathing, especially between paracenteses. - Order home oxygen. - Order home nebulizer machine and albuterol  solution for breathing treatments.   Chronic idiopathic constipation Assessment & Plan: Patient has ongoing constipation, with docusate being ineffective. Lactulose  has not been refilled. - Ensure lactulose  was included in the 12/03/2023 prescription batch sent to [facility]. Counselled caregiver on efficacy of lactulose  if dosed appropriately.   Moderate dementia, unspecified dementia type, unspecified whether behavioral, psychotic, or mood disturbance or anxiety [facility]) Assessment & Plan: Patient has history of schizophrenia and cognitive decline, likely Alzheimer's dementia. Caregiver reports worsening symptoms. Last neurology evaluation was in 2015. - Discussed lack of new, effective treatments for Alzheimer's beyond Aricept/Namenda . Declined to increase Namenda  due to risk of nausea. - Will order a new brain MRI for comparison to 2018 study, pending insurance  approval.   Centrilobular emphysema (HCC) Assessment & Plan: Requires home oxygen. No longer has this at home as it was previously prescribed by hospice.  Sending in home o2 rx.    Diabetes mellitus type 2 in nonobese [facility]) Assessment & Plan: On glipizide  5mg .  A1c 6 months ago was 5.7.  8.5 in 2021.        Return in about 2 months (around 02/14/2024) for f/u.  video visit.    Toribio MARLA Slain, MD

## 2023-12-15 NOTE — Patient Instructions (Signed)
 It was nice to see you today,  We addressed the following topics today: - I will send in the requests for home oxygen and the breathing treatments (nebulizer). - I will write a letter for you to give to the facility stating that you need to be present with your mother during her procedures. - Compare the medication list we print for you today with what your mother is currently taking. Please call our office or send a message through MyChart to let us  know which medications are still missing so we can send in the prescriptions. The prescriptions sent to [facility] on 12/03/2023 should arrive soon. - I will send a prescription for lisinopril  to your pharmacy. - I will place referrals to Neurology and Oncology for you. You can likely schedule with oncology without the referral, but I will send it anyway. - You do not need a video visit to review the medications. You can just call us  with the list.  Have a great day,  Rolan Slain, MD

## 2023-12-21 DIAGNOSIS — Z9981 Dependence on supplemental oxygen: Secondary | ICD-10-CM | POA: Insufficient documentation

## 2023-12-21 DIAGNOSIS — K5900 Constipation, unspecified: Secondary | ICD-10-CM | POA: Insufficient documentation

## 2023-12-21 DIAGNOSIS — F0390 Unspecified dementia without behavioral disturbance: Secondary | ICD-10-CM | POA: Insufficient documentation

## 2023-12-21 DIAGNOSIS — J449 Chronic obstructive pulmonary disease, unspecified: Secondary | ICD-10-CM | POA: Insufficient documentation

## 2023-12-21 NOTE — Assessment & Plan Note (Addendum)
-   Administered Invega  injection in office today. - Continue to see psychiatry with Dr. Landy for management of schizophrenia. - sending in mirtazapine

## 2023-12-21 NOTE — Assessment & Plan Note (Signed)
 Patient has history of schizophrenia and cognitive decline, likely Alzheimer's dementia. Caregiver reports worsening symptoms. Last neurology evaluation was in 2015. - Discussed lack of new, effective treatments for Alzheimer's beyond Aricept /Namenda . Declined to increase Namenda  due to risk of nausea. - Will order a new brain MRI for comparison to 2018 study, pending insurance approval.

## 2023-12-21 NOTE — Assessment & Plan Note (Signed)
 Patient has O2 saturation of 93% and caregiver reports increased work of breathing, especially between paracenteses. - Order home oxygen. - Order home nebulizer machine and albuterol  solution for breathing treatments.

## 2023-12-21 NOTE — Assessment & Plan Note (Signed)
 Patient has progressive ascites requiring paracentesis approximately every month, with 5L removed most recently on 12/11/2023. Fluid is reaccumulating quickly. Patient is symptomatic with dyspnea. - Continue PRN paracentesis up to 5L. 5 orders have been placed for future procedures. - Will provide a letter to caregiver supporting her presence during procedures due to patient's cognitive status.

## 2023-12-21 NOTE — Assessment & Plan Note (Addendum)
 On glipizide  5mg .  A1c 6 months ago was 5.7.  8.5 in 2021.

## 2023-12-21 NOTE — Assessment & Plan Note (Addendum)
 Requires home oxygen. No longer has this at home as it was previously prescribed by hospice.  Sending in home o2 rx.

## 2023-12-21 NOTE — Assessment & Plan Note (Signed)
 Patient has ongoing constipation, with docusate being ineffective. Lactulose  has not been refilled. - Ensure lactulose  was included in the 12/03/2023 prescription batch sent to [facility]. Counselled caregiver on efficacy of lactulose  if dosed appropriately.

## 2023-12-22 ENCOUNTER — Ambulatory Visit: Admitting: Family Medicine

## 2023-12-25 ENCOUNTER — Telehealth: Payer: Self-pay

## 2023-12-25 NOTE — Telephone Encounter (Signed)
 Copied from CRM [id number]. Topic: General - Other >> Dec 25, 2023 10:20 AM Delon DASEN wrote: Reason for CRM: daughter Rock asking if a letter was sent to [facility] stating she can be in with the patient for her paracentesis - please call [phone]

## 2023-12-25 NOTE — Telephone Encounter (Signed)
 Called patient daughter LVM to contact the office if she calls please advised that the paperwork  is upfront ready for pick up

## 2023-12-28 NOTE — Telephone Encounter (Signed)
 Ok thank you

## 2023-12-28 NOTE — Telephone Encounter (Signed)
 I have written the note and it is in the patient's MyChart. However if they want a physical copy, we can print one off and they can come pick it up at the clinic.

## 2023-12-29 DIAGNOSIS — I10 Essential (primary) hypertension: Secondary | ICD-10-CM | POA: Diagnosis not present

## 2023-12-29 DIAGNOSIS — R32 Unspecified urinary incontinence: Secondary | ICD-10-CM | POA: Diagnosis not present

## 2023-12-29 DIAGNOSIS — F17200 Nicotine dependence, unspecified, uncomplicated: Secondary | ICD-10-CM | POA: Diagnosis not present

## 2023-12-29 DIAGNOSIS — C569 Malignant neoplasm of unspecified ovary: Secondary | ICD-10-CM | POA: Diagnosis not present

## 2023-12-29 DIAGNOSIS — I82502 Chronic embolism and thrombosis of unspecified deep veins of left lower extremity: Secondary | ICD-10-CM | POA: Diagnosis not present

## 2023-12-29 DIAGNOSIS — Z604 Social exclusion and rejection: Secondary | ICD-10-CM | POA: Diagnosis not present

## 2023-12-29 DIAGNOSIS — Z556 Problems related to health literacy: Secondary | ICD-10-CM | POA: Diagnosis not present

## 2023-12-29 DIAGNOSIS — R180 Malignant ascites: Secondary | ICD-10-CM | POA: Diagnosis not present

## 2023-12-29 DIAGNOSIS — F0394 Unspecified dementia, unspecified severity, with anxiety: Secondary | ICD-10-CM | POA: Diagnosis not present

## 2023-12-29 DIAGNOSIS — F203 Undifferentiated schizophrenia: Secondary | ICD-10-CM | POA: Diagnosis not present

## 2023-12-31 MED ORDER — MIRTAZAPINE 15 MG PO TABS: 15.0000 mg | ORAL_TABLET | Freq: Every day | ORAL | 3 refills | Status: AC

## 2024-01-01 ENCOUNTER — Other Ambulatory Visit: Payer: Self-pay | Admitting: Family Medicine

## 2024-01-01 ENCOUNTER — Telehealth: Payer: Self-pay | Admitting: *Deleted

## 2024-01-01 NOTE — Telephone Encounter (Signed)
 Contacted pt daughter to see who supplied pts oxygen previously and she provided me [facility] and [employer], contacted [employer] which is the O2 company and they only serve the hospice patients, she stated that pt would probably need formal testing per insurance since she is no longer on Hospice care. While on phone the daughter stated that pt needs pullups and ensure.  Please advise.

## 2024-01-04 ENCOUNTER — Ambulatory Visit: Payer: Self-pay

## 2024-01-04 ENCOUNTER — Telehealth: Payer: Self-pay

## 2024-01-04 NOTE — Telephone Encounter (Signed)
 First attempt to reach patient at 6:20. Message left to call back at [phone].  Copied from CRM #[id number]. Topic: Clinical - Red Word Triage >> Jan 04, 2024  3:56 PM Kristen Colon wrote: Red Word that prompted transfer to Nurse Triage: Bad cough, shortness of breathe and extreme weakness

## 2024-01-04 NOTE — Telephone Encounter (Signed)
 Copied from CRM #[id number]. Topic: Referral - Question >> Jan 04, 2024  9:33 AM Darshell M wrote: Reason for CRM: Kristen Colon with [facility]. CB# [phone]. Clarify reason for the referral and send additional records to support referral reason.

## 2024-01-05 ENCOUNTER — Inpatient Hospital Stay (HOSPITAL_COMMUNITY): Admission: RE | Admit: 2024-01-05 | Source: Ambulatory Visit

## 2024-01-05 ENCOUNTER — Ambulatory Visit: Admitting: Family Medicine

## 2024-01-05 NOTE — Telephone Encounter (Signed)
 Kristen Colon with [facility] calling again to request clarification on referral. 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   Requesting callback: [phone]

## 2024-01-05 NOTE — H&P (Addendum)
 "   INTERNAL MEDICINE HOSPITALIST [facility]  HISTORY AND PHYSICAL:  Chief Complaint: Kristen Colon is a 73 y.o. female who presented to [facility] ER with altered mental status.  HPI: Patient was nearly nonverbal when I saw her and unable to provide history.  Patient was unable or unwilling to answer my questions regarding orientation, time of illness, etc. most this history is obtained from Dr. Noralyn who talked with patient's daughter earlier today.    Patient was brought in for increased confusion, dry cough, urinary frequency, possible fever although no temp was taken. History of Present Illness      Medical History: Medical History[1]  Surgical History: Surgical History[2]  Medications:  Current Outpatient Medications  Medication Instructions   acetaminophen  (TYLENOL ) 500 mg, Every 6 hours PRN   alum-mag hydroxide-simethicone  (MAALOX, MYLANTA) 200-200-20 mg/5 mL susp suspension 30 mL, oral, Every 4 hours PRN   [Paused] amLODIPine  (NORVASC ) 5 mg, Daily   apixaban  (ELIQUIS ) 5 mg, 2 times daily   ascorbic acid  (VITAMIN C  ORAL) 1 tablet, Daily   baclofen  (LIORESAL ) 10 mg, Daily PRN   benztropine  (COGENTIN ) 0.5 mg, oral, Nightly   BisaCODYL  (DULCOLAX) 10 mg, rectal, Daily PRN   buPROPion  (WELLBUTRIN  XL) 150 mg, Daily   calcium carbonate-vitamin D3 500 mg (200 mg Ca)-5 mcg (200 units Vit D) per tablet 1 tablet, Daily   diazePAM  (VALIUM ) 5 mg, oral, Daily   doxylamine-PE-DM-acetaminophen  (Vicks NyQuil Severe Cold-Flu) 6.25-5-10-325 mg tab 5 mL, Every 12 hours PRN   fentaNYL  (DURAGESIC ) 75 mcg/hr patch 1 patch, Every 72 hours   glipiZIDE  (GLUCOTROL ) 5 mg, Daily with breakfast   HYDROcodone -acetaminophen  (NORCO) 5-325 mg per tablet 1 tablet, At bedtime   Invega  Sustenna 234 mg/1.5 mL syrg injection ADMINISTER 1.5 ML IN THE MUSCLE EVERY 21 DAYS   lactulose  (CHRONULAC ) 10 g, 2 times daily   linaCLOtide  (LINZESS ) 290 mcg, Daily before breakfast    memantine  (NAMENDA ) 5 mg, oral, 2 times daily   metoprolol  tartrate (LOPRESSOR ) 25 mg tablet 0.5 tablets, 2 times daily   mirtazapine  (REMERON ) 15 mg, oral, At bedtime   multivitamin-iron-folic acid  (Multi Complete with Iron) 18-400 mg-mcg tab tablet 1 tablet, Daily   NON FORMULARY 1 capsule, Daily   omeprazole  20 mg TbEC 1 tablet, Daily   ondansetron  (ZOFRAN ) 4 mg, oral, Every 8 hours PRN   risperiDONE  (RISPERDAL ) 0.5 mg, oral, 2 times daily - Transplant   valACYclovir (VALTREX) 1,000 mg, oral, 3 times daily     Allergies: Patient has no known allergies.  Social History: Social History   Socioeconomic History   Marital status: Divorced    Spouse name: Not on file   Number of children: Not on file   Years of education: Not on file   Highest education level: Not on file  Occupational History   Not on file  Tobacco Use   Smoking status: Not on file   Smokeless tobacco: Not on file  Substance and Sexual Activity   Alcohol use: Not on file   Drug use: Not on file   Sexual activity: Not on file  Other Topics Concern   Not on file  Social History Narrative   Not on file   Social Drivers of Health   Living Situation: Medium Risk (10/17/2023)   Living Situation    What is your living situation today?: I have a place to live today, but I am worried about losing it in the future    Think about the  place you live. Do you have problems with any of the following? Choose all that apply:: None/None on this list  Food Insecurity: Low Risk (10/17/2023)   Food vital sign    Within the past 12 months, you worried that your food would run out before you got money to buy more: Never true    Within the past 12 months, the food you bought just didn't last and you didn't have money to get more: Never true  Transportation Needs: No Transportation Needs (10/17/2023)   Transportation    In the past 12 months, has lack of reliable transportation kept you from medical  appointments, meetings, work or from getting things needed for daily living? : No  Utilities: Low Risk (10/17/2023)   Utilities    In the past 12 months has the electric, gas, oil, or water  company threatened to shut off services in your home? : No  Safety: Low Risk (10/17/2023)   Safety    How often does anyone, including family and friends, physically hurt you?: Never    How often does anyone, including family and friends, insult or talk down to you?: Never    How often does anyone, including family and friends, threaten you with harm?: Never    How often does anyone, including family and friends, scream or curse at you?: Never  Alcohol Screening: Not on file  Tobacco Use: High Risk (12/15/2023)   Received from [facility]   Patient History    Smoking Tobacco Use: Every Day    Smokeless Tobacco Use: Never    Passive Exposure: Not on file  Depression: Not at risk (11/12/2021)   Received from [facility] visits prior to 03/22/2022.   PHQ-2    SDOH PHQ2 SCORE: 0  Social Connections: Moderately Isolated (06/22/2023)   Received from [facility]   Social Connection and Isolation Panel    In a typical week, how many times do you talk on the phone with family, friends, or neighbors?: Never    How often do you get together with friends or relatives?: More than three times a week    How often do you attend church or religious services?: 1 to 4 times per year    Do you belong to any clubs or organizations such as church groups, unions, fraternal or athletic groups, or school groups?: No    How often do you attend meetings of the clubs or organizations you belong to?: Never    Are you married, widowed, divorced, separated, never married, or living with a partner?: Divorced  Physicist, Medical Strain: Not on file    Family History: Family History[3]  Review of Systems: A complete 14 point ROS was conducted today with positive and negative pertinent findings  listed in the HPI.  All other systems are negative.    Labs/Studies: Labs, notes and radiographic studies have been personally reviewed.   Physical Exam: Temp:  [97.7 F (36.5 C)-99.5 F (37.5 C)] 98.6 F (37 C) Heart Rate:  [64-86] 86 Resp:  [13-19] 18 BP: (149-171)/(71-93) 156/93  GEN: Chronically ill-appearing woman crouched on a stretcher in the ER. EYES: EOMI ENT: Dry oral mucosa  CV: Regular PULM: CTA B ABD: soft, NT/ND, +BS EXT: No edema NEURO: No focal deficits PSYCH: A+Ox3, appropriate GU: No CVA tenderness MSK: No spinal tenderness     Assessment/Plan (all POA): Principal Problem:   Acute on chronic respiratory failure with hypoxia and hypercapnia    (CMD) -clinically and radiographically patient  appears to have bilateral influenza a pneumonia.  Continue supplemental oxygen and oseltamivir.  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    Abnormality on CT brain -case discussed with Dr. Jackquelyn and then neurology has been consulted.  Asymmetry of ventricles could be due to hydrocephalus.  Check MRI brain.    Paranoid schizophrenia    (CMD) -continue meds as prior to admission.  Restart Invega  after discharge.    Alcohol use disorder, severe, in sustained remission    (CMD)    History of tobacco abuse    Recurrent malignant ascites (CMD)    Deep vein thrombosis (DVT) of lower extremity 2023 (CMD) -continue apixaban  as she is very high risk of recurrent DVT with ovarian cancer.    Senile dementia (CMD)     Inpatient  DVT prophylaxis:    apixaban  Anticipated disposition: To Skilled Nursing Facility Estimated discharge:    greater than 3 days         [1] No past medical history on file. [2] No past surgical history on file. [3] No family history on file. "

## 2024-01-05 NOTE — Telephone Encounter (Signed)
 The diagnosis is from [facility] department.  Pt was diagnosed several years ago, has an [facility], and last saw Dr. Bridgette Lesches on 11/12/2021.  The pt was on hospice, and is now no longer on hospice.  She wishes to reconnect with her previous oncologist office so I put in the referral order.

## 2024-01-05 NOTE — Telephone Encounter (Signed)
 Called Arland she is advised

## 2024-01-06 NOTE — Progress Notes (Signed)
 Case Management Screening  CSN: [id number] DOB: [birth year] Service: General Medicine Location: 724/01   Initial Screening Readmission Risk Score v2: 22.7 Risk Level: High - Patient meets high risk criteria for post hospital services. Assessment to be completed. > 20% Readmission Risk Score: Yes Patient has new or exacerbation of functional deficits:: Yes History of multiple unscheduled readmission or hospitalization, including re-admission with 30 days:: Yes Discharge needs identified:: Yes Discharge needs:: Undetermined, CM/SW    Sabrina CHRISTELLA Platts, RN

## 2024-01-07 ENCOUNTER — Telehealth: Payer: Self-pay

## 2024-01-07 NOTE — Telephone Encounter (Signed)
 Copied from CRM #[id number]. Topic: General - Other >> Jan 07, 2024  2:43 PM Leonette P wrote: Reason for CRM: Patient is currently in the hospital.  Per [employer] employee.

## 2024-01-07 NOTE — Progress Notes (Signed)
 ------------------------------------------------------------------------------- Attestation signed by Worth Lynwood Ruth, MD at 01/07/2024  4:27 PM I reviewed the patient's status and agree with the plan of care as documented by Lonell Haws, DNP.   -------------------------------------------------------------------------------  [facility]  Hospital Progress note   Author: Electronically signed by: Lonell Ruth Haws, DNP, APRN 01/07/2024 8:27 AM   Date of Admission: 01/05/2024   ASSESSMENT:  Kristen Colon is a 73 y.o. year old female with past medical history significant for dementia, GERD, diabetes, prior DVT on Eliquis , and metastatic ovarian cancer, malignant ascites requiring monthly paracenteses last performed the day prior to admission who previously was on hospice who presented on 01/05/2024 with worsening confusion from baseline, fever found to be positive for influenza A.  Incidentally noted to have extra-axial fluid collection on CT Head for which MRI brain was obtained. 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 MRI does does not have typical appearance of SDH or subdural hygroma. Patient has been absent of fever and no leukocytosis on her CBC, the likelihood of infectious meningitis is relatively low, however lumbar puncture would more definitively rule out CNS infection. Suspect MRI brain finding are more likely related to malignancy. Patient was on hospice, but patient's daughter is requesting aggressive workup of intracranial lesion including lumbar puncture. Patient's apixaban  continues to be hold for anticipated IR guided LP on Friday.    RECOMMENDATIONS: -Okay to defer CNS coverage antimicrobials at this time. Low threshold to start CNS coverage antibiotics if she were to clinically worsen  -Patient's apixaban  continues to be hold for anticipated IR guided LP on Friday.   -Advise IR with the following labs: Cell count and differential x 2, protein, glucose, CSF culture and Gram stain, cytology and flow cytometry, freeze excess CSF  -Consider engaging palliative care to discuss utility of continued aggressive medical management with the daughter given her previous hospice status -Neurology will follow along    SUBJECTIVE:  Patient reports feels a little better. Reports feels weak all over. No family at bedside.    OBJECTIVE: Vital Signs Temp:  [97.5 F (36.4 C)-98.1 F (36.7 C)] 97.9 F (36.6 C) Heart Rate:  [54-86] 60 Resp:  [18] 18 BP: (101-144)/(55-89) 122/55   Physical Exam: GENERAL:  No acute distress. Does not appear irritable.  EYES:   Pupils: pupils equal RESPIRATORY: No respiratory distress  SKIN:   Inspection: well perfused, no edema.   MENTAL STATUS EXAM:  Orientation: Alert and oriented to person and place (hospital). Not oriented time.  Cooperative, follows simple commands  Language: Speech is clear and language is normal.    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  CN 3,4,6 (EOM): Full extraocular eye movement without nystagmus.  CN 5 (Trigeminal): Facial sensation is normal, no weakness of masticatory muscles.  CN 7 (Facial): No facial weakness or asymmetry.  CN 8 (Auditory): Auditory acuity grossly normal.  CN 12 (Hypoglossal): The tongue is midline.   MOTOR: Muscle Strength: Strength - Moving all 4 extremities spontaneously although appears to have some degree of generalized weakness.   COORDINATION: No tremor   SENSATION: Intact to light touch in al 4 extremities   GAIT: Deferred   Labs: I've reviewed the following labs for today   Lab Results (last 24 hours)     Procedure Component Value Ref Range Date/Time   POC Glucose [[id number]]  (Normal) Collected: 01/07/24 0742   Lab Status: Final result Specimen: Blood from Capillary  Updated: 01/07/24 0743    Glucose, POC 92 70 - 99 mg/dL     Comment:  Pre-Meal     Narrative:     Testing Personnel: Stuart Fish Lot Number: [id number] Instrument Serial Number: [id number] Testing performed using [employer]   POC Glucose [[id number]]  (Abnormal) Collected: 01/06/24 2020   Lab Status: Final result Specimen: Blood from Capillary Updated: 01/06/24 2021    Glucose, POC 191* 70 - 99 mg/dL     Comment: Recheck/confirm     Narrative:     Testing Personnel: Janise Helms Lot Number: [id number] Instrument Serial Number: [id number] Testing performed using [employer]   POC Glucose [[id number]]  (Normal) Collected: 01/06/24 1712   Lab Status: Final result Specimen: Blood from Capillary Updated: 01/06/24 1713    Glucose, POC 84 70 - 99 mg/dL     Comment: Post-Meal     Narrative:     Testing Personnel: Stuart Fish Lot Number: [id number] Instrument Serial Number: [id number] Testing performed using [employer]   POC Glucose [[id number]]  (Abnormal) Collected: 01/06/24 1134   Lab Status: Final result Specimen: Blood from Capillary Updated: 01/06/24 1135    Glucose, POC 124* 70 - 99 mg/dL     Comment: Pre-Meal Post-Meal     Narrative:     Testing Personnel: Stuart Fish Lot Number: [id number] Instrument Serial Number: [id number] Testing performed using [employer]       Significant Diagnostic Results: MRI Brain W And WO Contrast  Final Result by Garnette JONETTA Beaver, DO (12/17 9685)  Addendum (preliminary) 1 of 1 by Garnette JONETTA Beaver, DO (12/17 0314)  Date of Service: 2024-01-05 16:16:00    -----ADDENDUM-----:    Receipt of this report by the clinical staff was confirmed with Sherril Frost RN by Baruch Bence on Jan 06, 2024 03:13:00 EST.    Electronically signed by: Baruch Bence on 01/06/2024 03:14:43 AM   US Robinette    -----ORIGINAL REPORT-----:    EXAM:  MR Brain with and without Intravenous Contrast.    CLINICAL HISTORY:  Evaluate abnormalities on CT scan.     TECHNIQUE:  Magnetic  resonance images of the brain with and without intravenous   contrast in multiple planes.    CONTRAST:  With and without; Contrast: 6mL Gad      COMPARISON:   CT/SR - HEAD,HEAD (ADULT) - 01/05/24 12:57 EST      FINDINGS:    BRAIN:  There is an extra-axial collection adjacent to the lateral left cerebral   hemisphere, most pronounced adjacent to the left parietal lobe measuring   approximately 3 mm thickness. This is bright on FLAIR images. This is a   mildly bright on T2 weighted images. This is slightly dark on T1 weighted   images. There is no appreciable blooming artifact on SWI images images   that would be expected with hemorrhage. There is enhancement on   postcontrast T1 weighted images. This does not have the typical imaging   characteristics of a subdural hematoma, abscess, or subdural hygroma. This   is worrisome for thick pachymeningeal enhancement. This could represent a   chronic process as there is no adjacent brain edema. Infectious or   neoplastic meningitis is not excluded.   There is no restricted diffusion that would suggest an acute ischemic   infarct.   There is thinning of the corpus callosum. The cerebellar tonsils terminate   at the level of the foramen magnum in a normal fashion.   The flow voids  of the large arteries of the brain are grossly intact.   There is no enhancing mass within the brain. There is no brain edema.   There is no significant midline shift.  Chronic atrophy and chronic white matter changes are noted.     VENTRICLES:  Chronic ventriculomegaly is noted.     ORBITS:  The globes and orbital contents are within normal limits.     SINUSES AND MASTOIDS:  Paranasal sinuses and mastoid air cells are clear.     BONES:  No acute fracture or focal osseous lesion.    IMPRESSION:    Left supratentorial extra-axial abnormality with differential   considerations as detailed above. This needs follow-up.    Electronically signed by: Garnette Beaver, DO on 01/06/2024 03:09:31 AM   US Robinette    Final  Date of Service: 2024-01-05 16:16:00    EXAM:  MR Brain with and without Intravenous Contrast.    CLINICAL HISTORY:  Evaluate abnormalities on CT scan.     TECHNIQUE:  Magnetic resonance images of the brain with and without intravenous   contrast in multiple planes.    CONTRAST:  With and without; Contrast: 6mL Gad      COMPARISON:   CT/SR - HEAD,HEAD (ADULT) - 01/05/24 12:57 EST      FINDINGS:    BRAIN:  There is an extra-axial collection adjacent to the lateral left cerebral   hemisphere, most pronounced adjacent to the left parietal lobe measuring   approximately 3 mm thickness. This is bright on FLAIR images. This is a   mildly bright on T2 weighted images. This is slightly dark on T1 weighted   images. There is no appreciable blooming artifact on SWI images images   that would be expected with hemorrhage. There is enhancement on   postcontrast T1 weighted images. This does not have the typical imaging   characteristics of a subdural hematoma, abscess, or subdural hygroma. This   is worrisome for thick pachymeningeal enhancement. This could represent a   chronic process as there is no adjacent brain edema. Infectious or   neoplastic meningitis is not excluded.   There is no restricted diffusion that would suggest an acute ischemic   infarct.   There is thinning of the corpus callosum. The cerebellar tonsils terminate   at the level of the foramen magnum in a normal fashion.   The flow voids of the large arteries of the brain are grossly intact.   There is no enhancing mass within the brain. There is no brain edema.   There is no significant midline shift.  Chronic atrophy and chronic white matter changes are noted.     VENTRICLES:  Chronic ventriculomegaly is noted.     ORBITS:  The globes and orbital contents are within normal limits.     SINUSES AND MASTOIDS:  Paranasal sinuses and mastoid air cells  are clear.     BONES:  No acute fracture or focal osseous lesion.    IMPRESSION:    Left supratentorial extra-axial abnormality with differential   considerations as detailed above. This needs follow-up.    Electronically signed by: Garnette Beaver, DO on 01/06/2024 03:09:31 AM   US Robinette    CT Head WO Contrast W Quant CT Tiss Character When Performed  Final Result by Niels Janifer Kato, MD [phone])  CT HEAD WITHOUT CONTRAST, 01/05/2024 1:02 PM    INDICATION: Altered mental status     COMPARISON: CT head 09/03/2009    TECHNIQUE: Axial CT  images of the brain from skull base to vertex,   including portions of the face and sinuses, were obtained without   contrast. Supplemental 2D reformatted images were generated and reviewed   as needed.    All CT scans at [facility]   Imaging are performed using radiation dose optimization techniques as   appropriate to a performed exam, including but not limited to one or more   of the following: automatic exposure control, adjustment of the mA and/or   kV according to patient size, use of iterative reconstruction technique.   In addition, our institution participates in a radiation dose monitoring   program to optimize patient radiation exposure.    FINDINGS:  Calvarium/skull base: No evidence of acute fracture or destructive lesion.   Mastoids and middle ears demonstrate no substantial mucosal disease.   Edentulous. Likely remote blowout fracture involving the medial wall and   floor of the left orbit.    Paranasal sinuses: No air fluid levels.    Brain: Small hypoattenuating subdural collection along the left parietal   cerebral convexity measuring 2-3 mm in maximal thickness. No significant   associated mass effect. Interval ventricular enlargement which may be   partly ex vacuo but is out of proportion to cerebral volume loss with mild   crowding of the sulci at the apex, widening  of the sylvian fissures   bilaterally, and an acute pericallosal angle. No acute large vascular   territory infarct. No acute hemorrhage. Sequelae of chronic small vessel   disease with age-appropriate global cerebral volume loss. Intracranial   atherosclerosis. Remote appearing left basal ganglia infarct.    IMPRESSION:  1.  Small left cerebral convexity hypoattenuating subdural collection   which could represent subdural empyema if the patient has meningitis. 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 MRI brain with and without could further evaluate if clinically   indicated.  2.  Interval enlargement of ventricular system which may be in part due to   ex vacuo expansion from progressive volume loss but appears out of   proportion to degree of cerebral atrophy. Differential includes normal   pressure hydrocephalus in the appropriate clinical setting or   hydrocephalus secondary to meningitis.  3.  No acute intracranial hemorrhage.    COMMUNICATION: Findings discussed with Dr. Franky Fleischer by Dr. Marvine via   telephone on 01/05/2024 2:00 PM.    XR Chest 1 View  Final Result by Bennet Ozell Hurst, MD (12/16 1529)  XR CHEST 1 VIEW, 01/05/2024 11:17 AM    INDICATION:altered mental status   COMPARISON: Chest radiograph 10/16/2023    FINDINGS:     Supportive devices: None  Cardiovascular/lungs/pleura: Cardiac silhouette and pulmonary vasculature   are within normal limits. Similar diminished bilateral lung volumes with   resultant accentuation of vascular markings. Again noted diffuse bilateral   interstitial thickening. No confluent opacity. No pleural effusion or   pneumothorax.   Other: No interval osseous change.    IMPRESSION:  Similar diffuse bilateral interstitial thickening, may represent mild   pulmonary interstitial edema versus scattered atelectasis secondary to low   lung volumes.             I have reviewed the labs and  diagnostic test results listed above.  *Some images could not be shown.

## 2024-01-07 NOTE — Consults (Addendum)
 Nutrition Note   PATIENT NAME: Kristen Colon DATE: 01/07/2024  TIME: 11:11 AM  Note Type: Assessment Referral Reason: Nutrition consult  Assessment  Pt is a 73 y.o female who admitted with altered mental status. Found to have acute on chronic respiratory failure with hypoxia and hypercapnia.  Pt with adenocarcinoma of ovary diagnosed 07/08/2017 per MD note. Pt reported good appetite PTA. Denied appetite change. Typically eats two meals/day. Ensure Plus BID in place. No nausea,vomiting,abdominal pain noted. Pt with distended abdomen per nursing documentation.      Interventions  Nutrition Intervention: Collaboration and referral of nutrition care, Medical food supplements, Vitamin and/or mineral supplements  Diet per medical team/SLP team Ensure Plus BID Encourage PO intake    Nutrition Diagnosis  Moderate protein-calorie malnutrition POA  Malnutrition Etiology: Chronic illness  Problem Related To: Chronic illness       Muscle Mass Depletion Summary: Moderate Body Fat Depletion Summary: Mild/Moderate           Nutrition Focused Physical Findings  NFPE Performed Date: 01/07/24 Signs of Muscle Wasting, Fat Loss or Micronutrient Deficiencies: Yes- Limited. Pt unable to re-position.   Muscle Mass Depletion: Temporalis: Moderate Pectoralis: Moderate   Body Fat Depletion: Orbital Region: Moderate Buccal Region: Mild Triceps Region: Moderate            Pertinent Information   Weights (last 14 days)     Date/Time Weight   01/05/24 1055 62.1 kg (137 lb)       Wt Readings from Last 20 Encounters:  01/05/24 62.1 kg (137 lb)  10/21/23 62 kg (136 lb 11 oz)  03/07/22 59 kg (130 lb)  11/12/21 62.6 kg (138 lb)  10/22/21 63.6 kg (140 lb 4.8 oz)  01/31/20 58.4 kg (128 lb 12.8 oz)  10/11/19 56.8 kg (125 lb 3.2 oz)  06/06/19 52.8 kg (116 lb 6.4 oz)  05/31/19 53.7 kg (118 lb 6.4 oz)  05/10/19 53.7 kg (118 lb 6.4 oz)  04/12/19 54 kg (119 lb)   Limited weight history on file  2024,2025.   UBW: Unable to provide. Denied prior wt loss.  Weight trended up per chart.   Weight History/Comments: . Weight Classification: For adult > 80 years old, Underweight BMI: 22.7 Skin Integrity: No pressure injury noted Nutrition Related Medications: ascorbic acid ,calcium carbonate-vitamin,valium ,humalog,lactulose ,lopressor ,remeron ,multivitamin with minerals,omeprazole ,tamiflu Labs: A1C:5.8 GI Symptoms: Other (Comment)      Current Diet and Nutrition Support Details  Diet Dysphagia; Thin liquids (no thickener added); Pureed foods (Level 4). Meal intake: 50%-75%    Enteral Nutrition                 Parenteral Nutrition           Total Regimen Provides      Estimated Needs  Nutrition Needs Based On: Actual weight (62.1 kg)    Calories (kcal/day): 8446-8136  Calories based on: 25-30 kcal/kg  Protein (g/day): 62-75  Protein based on: 1-1.2g/kg  Fluid (mL/day):   1-1.1 mL/kcal, Per MD      Monitoring & Evaluation  Nutrition Goals: Adequate PO, Other (comment), Appropriate glucose control Florie leos) Nutrition Goal Status: Initial   Follow-up Information  Follow-Up Date: 01/14/24 Follow-Up Reason: Reassessment   Contact RD on treatment team. After hours or weekends, use secure chat group: [facility]  [facility] [place]  [facility]  [facility]  [facility]  [facility]ians   Marvis Killian, RD 01/07/2024 11:11 AM

## 2024-01-07 NOTE — Telephone Encounter (Signed)
 Copied from CRM #[id number]. Topic: Clinical - Order For Equipment >> Jan 07, 2024  2:39 PM Leonette P wrote: Reason for CRM: Renda with [employer] asking if provide could give an order for pull ups, Chucks pad, Ensure provider will have to pt the amount she should have a day.  She said [employer] will pay for these items  [phone] Fax  [phone]

## 2024-01-07 NOTE — Progress Notes (Signed)
 Spoke with the patient's daughter Kristen Colon this morning. She expressed feeling overwhelmed as she knows she cannot currently care for her mother since, she too is ill.  After sharing some medical history, she expressed her desire that Palliative Care be consulted to offer another perspective.   Please reach out to Spiritual Care if immediate support is needed. I will continue to speak with Kristen Colon in support of both Kristen Colon and the [facility] staff.

## 2024-01-08 ENCOUNTER — Other Ambulatory Visit: Payer: Self-pay | Admitting: Family Medicine

## 2024-01-08 DIAGNOSIS — F203 Undifferentiated schizophrenia: Secondary | ICD-10-CM

## 2024-01-08 DIAGNOSIS — F03B0 Unspecified dementia, moderate, without behavioral disturbance, psychotic disturbance, mood disturbance, and anxiety: Secondary | ICD-10-CM

## 2024-01-08 DIAGNOSIS — C569 Malignant neoplasm of unspecified ovary: Secondary | ICD-10-CM

## 2024-01-08 NOTE — Progress Notes (Addendum)
 Case Management Update  Date: 01/08/2024   Time: 2:34 PM   Patient Type: Inpatient       Case Management Coordination Status: Coordination In-Progress    Anticipated Discharge Location: Home with Hospice services  If Plan A discharging location is not feasible: Potential Plan B: To be determined  SW called patients daughter Rock Hem ([phone]) to discuss discharge planning. Pt identity verified via Facesheet information. Rock stated she spoke with a neurologist this morning and discussed the additional workup she previously requested, ultimately deciding to have the patient return home with home health hospice services. Rock verified the address on file as the residence the pt will discharge to. She requested [facility] for hospice services. Rock stated she also has help from [facility] x4 days each week to look after her mom when she is at work.  Choice form signed and sent in for scanning. Order signed and referral sent in via inbasket to [facility]. SW contacted [employer] via phone to verbally place referral. Hunter stated she would update SW on acceptance of services once she reviews the medical chart. SW will continue to assist with d/c planning needs.        Kaitlyn M Wilkinson, MSW   Social Worker II [facility]

## 2024-01-08 NOTE — Telephone Encounter (Signed)
 I have printed it out as a misc. DME order

## 2024-01-09 NOTE — Care Plan (Signed)
 " Problem: Activity: Goal: Participation in diversional activities to the maximum extent possible will be supported - LTG Description: Participation in diversional activities to the maximum extent possible will be supported 01/09/2024 1536 by Kate CINDERELLA Karvonen, RN Outcome: Adequate for Discharge 01/09/2024 1536 by Kate CINDERELLA Karvonen, RN Outcome: Progressing   Problem: Bowel/Gastric: Goal: Establishment of normal bowel function will be supported Description: Establishment of normal bowel function will be supported 01/09/2024 1536 by Kate CINDERELLA Karvonen, RN Outcome: Adequate for Discharge 01/09/2024 1536 by Kate CINDERELLA Dellinger, RN Outcome: Progressing   Problem: Health Behavior Goal: Knowledge of disease or condition will improve - LTG Description: Knowledge of disease or condition will improve 01/09/2024 1536 by Kate CINDERELLA Karvonen, RN Outcome: Adequate for Discharge 01/09/2024 1536 by Kate CINDERELLA Karvonen, RN Outcome: Progressing Goal: Understanding of Falls Prevention Description: Ability to state ways to decrease the risk of falls will improve 01/09/2024 1536 by Kate CINDERELLA Karvonen, RN Outcome: Adequate for Discharge 01/09/2024 1536 by Kate CINDERELLA Karvonen, RN Outcome: Progressing Goal: Knowledge of disease or condition will improve Description: Knowledge of disease or condition will improve 01/09/2024 1536 by Kate CINDERELLA Karvonen, RN Outcome: Adequate for Discharge 01/09/2024 1536 by Kate CINDERELLA Karvonen, RN Outcome: Progressing Goal: Understanding of proper administration and use of medicines will improve Description: Understanding of proper administration and use of medicines will improve 01/09/2024 1536 by Kate CINDERELLA Karvonen, RN Outcome: Adequate for Discharge 01/09/2024 1536 by Kate CINDERELLA Karvonen, RN Outcome: Progressing   Problem: Coping: Goal: Orientation to Person, Place and Time Description: Orientation to person, place, and time will return to baseline 01/09/2024 1536 by Kate CINDERELLA Karvonen, RN Outcome: Adequate for Discharge 01/09/2024 1536 by Kate CINDERELLA Karvonen, RN Outcome: Progressing Goal: Imbalance in normal sleep/wake cycle will improve Description: Imbalance in normal sleep/wake cycle will improve 01/09/2024 1536 by Kate CINDERELLA Karvonen, RN Outcome: Adequate for Discharge 01/09/2024 1536 by Kate CINDERELLA Karvonen, RN Outcome: Progressing Goal: Identifies Interruptions to Sleep Patterns Description: Ability to identify behavior or circumstances that interrupt sleep patterns will improve 01/09/2024 1536 by Kate CINDERELLA Karvonen, RN Outcome: Adequate for Discharge 01/09/2024 1536 by Kate CINDERELLA Karvonen, RN Outcome: Progressing Goal: *Identify Factors that Increase Anxiety Description: Identification of factors that increase anxiety will improve 01/09/2024 1536 by Kate CINDERELLA Karvonen, RN Outcome: Adequate for Discharge 01/09/2024 1536 by Kate CINDERELLA Karvonen, RN Outcome: Progressing Goal: Demonstrates Calm Behavior Description: Demonstrations of calm behavior will increase 01/09/2024 1536 by Kate CINDERELLA Karvonen, RN Outcome: Adequate for Discharge 01/09/2024 1536 by Kate CINDERELLA Karvonen, RN Outcome: Progressing Goal: *Optimal Functioning Description: Achieve optimal level of functioning 01/09/2024 1536 by Kate CINDERELLA Karvonen, RN Outcome: Adequate for Discharge 01/09/2024 1536 by Kate CINDERELLA Karvonen, RN Outcome: Progressing   Problem: Medication: Goal: Compliance with prescribed medication regimen will improve - STG Description: Compliance with prescribed medication regimen will improve 01/09/2024 1536 by Kate CINDERELLA Karvonen, RN Outcome: Adequate for Discharge 01/09/2024 1536 by Kate CINDERELLA Karvonen, RN Outcome: Progressing Goal: Risk for medication side effects will decrease Description: Risk for medication side effects will decrease 01/09/2024 1536 by Kate CINDERELLA Karvonen, RN Outcome: Adequate for Discharge 01/09/2024 1536 by Kate CINDERELLA Karvonen, RN Outcome: Progressing    Problem: Nutritional: Goal: Achieves Adequate Nutritional Intake Description: Ability to achieve adequate nutritional intake will be supported 01/09/2024 1536 by Kate CINDERELLA Karvonen, RN Outcome: Adequate for Discharge 01/09/2024 1536 by Kate CINDERELLA Karvonen, RN Outcome: Progressing   Problem: Respiratory: Goal: Ability to maintain adequate ventilation will improve Description: Ability to maintain adequate  ventilation will improve 01/09/2024 1536 by Kate CINDERELLA Karvonen, RN Outcome: Adequate for Discharge 01/09/2024 1536 by Kate CINDERELLA Karvonen, RN Outcome: Progressing Goal: Ability to maintain normal pulse oximetry readings will improve by discharge Description: Ability to maintain normal pulse oximetry readings will be supported Ability to maintain normal pulse oximetry readings will improve by discharge 01/09/2024 1536 by Kate CINDERELLA Karvonen, RN Outcome: Adequate for Discharge 01/09/2024 1536 by Kate CINDERELLA Karvonen, RN Outcome: Progressing Goal: Ability to maintain adequate ventilation will improve in 24 hours Description: Ability to maintain adequate ventilation will improve by discharge 01/09/2024 1536 by Kate CINDERELLA Karvonen, RN Outcome: Adequate for Discharge 01/09/2024 1536 by Kate CINDERELLA Karvonen, RN Outcome: Progressing Goal: Ability to maintain arterial blood gas levels will improve by discharge Description: Ability to maintain arterial blood gas levels within normal range will improve 01/09/2024 1536 by Kate CINDERELLA Karvonen, RN Outcome: Adequate for Discharge 01/09/2024 1536 by Kate CINDERELLA Karvonen, RN Outcome: Progressing   Problem: Safety: Goal: Identifies and Utilizes Support Systems for Safety Description: Ability to identify and utilize support systems that promote safety will improve 01/09/2024 1536 by Kate CINDERELLA Karvonen, RN Outcome: Adequate for Discharge 01/09/2024 1536 by Kate CINDERELLA Karvonen, RN Outcome: Progressing Goal: Will remain free from falls by discharge Description: Will  remain free from falls by discharge 01/09/2024 1536 by Kate CINDERELLA Karvonen, RN Outcome: Adequate for Discharge 01/09/2024 1536 by Kate CINDERELLA Karvonen, RN Outcome: Progressing   Problem: Sensory: Goal: Identifies Factors That Increase Pain Description: Ability to identify factors that increase the pain will improve 01/09/2024 1536 by Kate CINDERELLA Karvonen, RN Outcome: Adequate for Discharge 01/09/2024 1536 by Kate CINDERELLA Karvonen, RN Outcome: Progressing Goal: General experience of comfort will improve Description: General experience of comfort will improve 01/09/2024 1536 by Kate CINDERELLA Karvonen, RN Outcome: Adequate for Discharge 01/09/2024 1536 by Kate CINDERELLA Karvonen, RN Outcome: Progressing   Problem: Urinary Elimination: Goal: *Adequate Bladder Management Description: Ability to maintain adequate bladder management will improve 01/09/2024 1536 by Kate CINDERELLA Karvonen, RN Outcome: Adequate for Discharge 01/09/2024 1536 by Kate CINDERELLA Karvonen, RN Outcome: Progressing   Problem: Knowledge Deficit Goal: Patient/family/caregiver demonstrates understanding of disease process, treatment plan, medications, and discharge instructions Description: Complete learning assessment and assess knowledge base. 01/09/2024 1536 by Kate CINDERELLA Karvonen, RN Outcome: Adequate for Discharge 01/09/2024 1536 by Kate CINDERELLA Karvonen, RN Outcome: Progressing   Problem: Compromised Skin Integrity Goal: Skin integrity is maintained or improved Description: Assess and monitor skin integrity. Identify patients at risk for skin breakdown on admission and per policy. Collaborate with interdisciplinary team and initiate plans and interventions as needed.    01/09/2024 1536 by Kate CINDERELLA Karvonen, RN Outcome: Adequate for Discharge 01/09/2024 1536 by Kate CINDERELLA Karvonen, RN Outcome: Progressing Goal: Fluid and electrolyte balance are achieved/maintained Description: Assess and monitor vital signs (orthostatic vitals if applicable),  fluid intake and output, urine color, labs, skin turgor, mucous membranes, jugular venous distention, edema, circumference of edematous extremities and abdominal girth, respiratory status, and mental status.  Monitor for signs and symptoms of hypovolemia (tachycardia, rapid breathing, decreased urine output, postural hypotension, confusion, syncope).  Monitor for signs and symptoms of hypervolemia (strong rapid pulse, shortness of breath, difficulty breathing lying down, crackles heard in lung fields, edema). Collaborate with interdisciplinary team and initiate plan and interventions as ordered. 01/09/2024 1536 by Kate CINDERELLA Karvonen, RN Outcome: Adequate for Discharge 01/09/2024 1536 by Kate CINDERELLA Karvonen, RN Outcome: Progressing Goal: Nutritional status is improving Description: Monitor and assess patient for malnutrition (ex-  brittle hair, bruises, dry skin, pale skin and conjunctiva, muscle wasting, smooth red tongue, and disorientation). Collaborate with interdisciplinary team and initiate plan and interventions as ordered.  Monitor patient's weight and dietary intake as ordered or per policy. Utilize nutrition screening tool and intervene per policy. Determine patient's food preferences and provide high-protein, high-caloric foods as appropriate.  01/09/2024 1536 by Kate CINDERELLA Karvonen, RN Outcome: Adequate for Discharge 01/09/2024 1536 by Kate CINDERELLA Dellinger, RN Outcome: Progressing   Problem: Urinary Incontinence Goal: Perineal skin integrity is maintained or improved Description: Assess genitourinary system, perineal skin, labs (urinalysis), and history of incontinence to include past management, aggravating, and alleviating factors.  Collaborate with interdisciplinary team and initiate plans and interventions as needed. 01/09/2024 1536 by Kate CINDERELLA Karvonen, RN Outcome: Adequate for Discharge 01/09/2024 1536 by Kate CINDERELLA Karvonen, RN Outcome: Progressing   Problem: Health Behavior: Goal: MCB  Ability to state ways to decrease the risk of falls will be met by discharge Description: Ability to state ways to decrease the risk of falls will improve by discharge 01/09/2024 1536 by Kate CINDERELLA Karvonen, RN Outcome: Adequate for Discharge 01/09/2024 1536 by Kate CINDERELLA Karvonen, RN Outcome: Progressing   Problem: SAFETY - MEDICAL RESTRAINT Goal: Remains free of injury from restraints (Restraint for Interference with Medical Device) Description: INTERVENTIONS: 1. Determine that other, less restrictive measures have been tried or would not be effective before applying the restraint 2. Evaluate the patient's condition at the time of restraint application 3. Inform patient/family regarding the reason for restraint 4. Q2H: Monitor safety, psychosocial status, comfort, nutrition and hydration 01/09/2024 1536 by Kate CINDERELLA Karvonen, RN Outcome: Adequate for Discharge 01/09/2024 1536 by Kate CINDERELLA Karvonen, RN Outcome: Progressing Goal: Free from restraint(s) (Restraint for Interference with Medical Device) Description: INTERVENTIONS: 1. ONCE/SHIFT or MINIMUM Q12H: Assess and document the continuing need for restraints 2. Q24H: Continued use of restraint requires LIP to perform face to face examination and written order 3. Identify and implement measures to help patient regain control 01/09/2024 1536 by Kate CINDERELLA Karvonen, RN Outcome: Adequate for Discharge 01/09/2024 1536 by Kate CINDERELLA Karvonen, RN Outcome: Progressing   Problem: Activity: Goal: Ability to tolerate increased activity will improve Description: Ability to tolerate increased activity will improve 01/09/2024 1536 by Kate CINDERELLA Karvonen, RN Outcome: Adequate for Discharge 01/09/2024 1536 by Kate CINDERELLA Karvonen, RN Outcome: Progressing   "

## 2024-01-10 NOTE — Progress Notes (Addendum)
 Case Management Update  Date: 01/10/2024   Time: 12:22 PM   Patient Type: Inpatient I have received several updates from [facility] charge nurse Amy Lanier RN stating pt's daughter has called and is ready for her mom to return home.She originally planned to pick her up but has changed her mind and is requesting her mom be brought home via [facility] rather than her pick her up today.  I reached out to Darice Ebbing RN, Alberta Shutter SW MSW supervisor to discuss daughter requesting return while there is an active appeal. I notified MD of the same. I have been updated by above to proceed with dc as daughter is willing and requesting her to be discharged home today. Appeal is still active with no determination at this time. I also received a message from Lyle Lowers, RN supervisor stating Alexia called asking that I speak to family as they were confused about the  plan.  I called Rock Glee [phone] to make her aware that provider has completed the dc summary and [facility] ambulance will be set up. I asked if she has a specific time and she said No just as soon as they can. I informed her that [facility] has been set up and confirmed with [facility] as she had requested. She thanked me for the above and had no further questions for this CM. She is agreeable to plan.   [facility] set up for 2:30pm. I called Rock back to update her on the scheduled pick up but no answer.    Case Management Coordination Status: Coordination Complete    Anticipated Discharge Location: Home with Home Health  If Plan A discharging location is not feasible: Potential Plan B: Home with Home Health         Dorothe VEAR Hover, RN

## 2024-01-10 NOTE — Progress Notes (Signed)
 Physician's Medical Necessity Certification Form (Required for Medicare / Medicaid Beneficiaries for non-emergency, scheduled or non-scheduled, ambulance transportation)  Ambulance Company and Contact #: [facility]  [phone])  Patient's Name: Kristen Colon Social Security Number: [id number] Address: [address] [place] [address] Phone Number: [phone] (home)  Transfer Information  Date of Service: 01/10/24 Origin of Transport:  [facility] Transport Destination:  [address] [place] Phone:   City: [place] State: [place] Please Check: Residence  Medical Guidelines  Medicare Regulations, Part 410.40 (d)(1) defines Medical Necessity and Bed Confined In order for ambulance service to be covered, they must be medically necessary and reasonable.  Medical necessity is established when the patients' condition is such that transportation by other means is contraindicated.  The Health Care Financing Administration has defined Bed Confinement as: Patient is unable to get up from bed without assistance Patient is unable to ambulate Patient is unable to sit in a chair or wheelchair  If the said patient does not meet Bed Confined criteria as defined in the Medicare regulation, can this patient be transported safely unmonitored in a wheelchair van?  No  If No, please indicate the appropriate medical condition(s):  Dementia, Severe Weakness, Other (Comment) (High Risk for Falls/High Risk to self and others/Gait unsteady/Cognitive disorder)  ---------------------------------------------------------------------------------------------------------------------- I certify that the above information is true and correct based on my evaluation of this patient to the best of my knowledge and professional training, as supported in the medical record of this patient.   I understand that this information will be used by the  Department of Health and Health And Safety Inspector, Healthcare Financing Administration to support the determination of medical necessity for ambulance services.  I certify that the above information is true and correct based on my evaluation of this patient to the best of my knowledge and professional training, as supported in the medical record of this patient.  It is understood that I, the Medical Professional; being a Designer, Jewellery, Case Designer, Television/film Set, has received a verbal order from the assigned physician, being Dr. Dorina Sheena Butt on the Date of 01/10/2024, to authorize the medical necessity for the transport of the said patient above.  I understand that this information will be used by the Department of Health and Health And Safety Inspector, Healthcare Financing Administration to support the determination of medical necessity for ambulance services.    Signature of Medical Professional:  Dorothe VEAR Hover, RN Date:  01/10/2024

## 2024-01-10 NOTE — Care Plan (Signed)
 " Problem: Activity: Goal: Participation in diversional activities to the maximum extent possible will be supported - LTG Description: Participation in diversional activities to the maximum extent possible will be supported Outcome: Progressing   Problem: Bowel/Gastric: Goal: Establishment of normal bowel function will be supported Description: Establishment of normal bowel function will be supported Outcome: Progressing   Problem: Health Behavior Goal: Knowledge of disease or condition will improve - LTG Description: Knowledge of disease or condition will improve Outcome: Progressing Goal: Understanding of Falls Prevention Description: Ability to state ways to decrease the risk of falls will improve Outcome: Progressing Goal: Knowledge of disease or condition will improve Description: Knowledge of disease or condition will improve Outcome: Progressing Goal: Understanding of proper administration and use of medicines will improve Description: Understanding of proper administration and use of medicines will improve Outcome: Progressing   Problem: Coping: Goal: Orientation to Person, Place and Time Description: Orientation to person, place, and time will return to baseline Outcome: Progressing Goal: Imbalance in normal sleep/wake cycle will improve Description: Imbalance in normal sleep/wake cycle will improve Outcome: Progressing Goal: Identifies Interruptions to Sleep Patterns Description: Ability to identify behavior or circumstances that interrupt sleep patterns will improve Outcome: Progressing Goal: *Identify Factors that Increase Anxiety Description: Identification of factors that increase anxiety will improve Outcome: Progressing Goal: Demonstrates Calm Behavior Description: Demonstrations of calm behavior will increase Outcome: Progressing Goal: *Optimal Functioning Description: Achieve optimal level of functioning Outcome: Progressing   Problem: Medication: Goal:  Compliance with prescribed medication regimen will improve - STG Description: Compliance with prescribed medication regimen will improve Outcome: Progressing Goal: Risk for medication side effects will decrease Description: Risk for medication side effects will decrease Outcome: Progressing   Problem: Nutritional: Goal: Achieves Adequate Nutritional Intake Description: Ability to achieve adequate nutritional intake will be supported Outcome: Progressing   Problem: Respiratory: Goal: Ability to maintain adequate ventilation will improve Description: Ability to maintain adequate ventilation will improve Outcome: Progressing Goal: Ability to maintain normal pulse oximetry readings will improve by discharge Description: Ability to maintain normal pulse oximetry readings will be supported Ability to maintain normal pulse oximetry readings will improve by discharge Outcome: Progressing Goal: Ability to maintain adequate ventilation will improve in 24 hours Description: Ability to maintain adequate ventilation will improve by discharge Outcome: Progressing Goal: Ability to maintain arterial blood gas levels will improve by discharge Description: Ability to maintain arterial blood gas levels within normal range will improve Outcome: Progressing   Problem: Safety: Goal: Identifies and Utilizes Support Systems for Safety Description: Ability to identify and utilize support systems that promote safety will improve Outcome: Progressing Goal: Will remain free from falls by discharge Description: Will remain free from falls by discharge Outcome: Progressing   Problem: Sensory: Goal: Identifies Factors That Increase Pain Description: Ability to identify factors that increase the pain will improve Outcome: Progressing Goal: General experience of comfort will improve Description: General experience of comfort will improve Outcome: Progressing   Problem: Urinary Elimination: Goal: *Adequate  Bladder Management Description: Ability to maintain adequate bladder management will improve Outcome: Progressing   Problem: Knowledge Deficit Goal: Patient/family/caregiver demonstrates understanding of disease process, treatment plan, medications, and discharge instructions Description: Complete learning assessment and assess knowledge base. Outcome: Progressing   Problem: Compromised Skin Integrity Goal: Skin integrity is maintained or improved Description: Assess and monitor skin integrity. Identify patients at risk for skin breakdown on admission and per policy. Collaborate with interdisciplinary team and initiate plans and interventions as needed.    Outcome: Progressing  Goal: Fluid and electrolyte balance are achieved/maintained Description: Assess and monitor vital signs (orthostatic vitals if applicable), fluid intake and output, urine color, labs, skin turgor, mucous membranes, jugular venous distention, edema, circumference of edematous extremities and abdominal girth, respiratory status, and mental status.  Monitor for signs and symptoms of hypovolemia (tachycardia, rapid breathing, decreased urine output, postural hypotension, confusion, syncope).  Monitor for signs and symptoms of hypervolemia (strong rapid pulse, shortness of breath, difficulty breathing lying down, crackles heard in lung fields, edema). Collaborate with interdisciplinary team and initiate plan and interventions as ordered. Outcome: Progressing Goal: Nutritional status is improving Description: Monitor and assess patient for malnutrition (ex- brittle hair, bruises, dry skin, pale skin and conjunctiva, muscle wasting, smooth red tongue, and disorientation). Collaborate with interdisciplinary team and initiate plan and interventions as ordered.  Monitor patient's weight and dietary intake as ordered or per policy. Utilize nutrition screening tool and intervene per policy. Determine patient's food preferences and  provide high-protein, high-caloric foods as appropriate.  Outcome: Progressing   Problem: Urinary Incontinence Goal: Perineal skin integrity is maintained or improved Description: Assess genitourinary system, perineal skin, labs (urinalysis), and history of incontinence to include past management, aggravating, and alleviating factors.  Collaborate with interdisciplinary team and initiate plans and interventions as needed. Outcome: Progressing   Problem: Health Behavior: Goal: MCB Ability to state ways to decrease the risk of falls will be met by discharge Description: Ability to state ways to decrease the risk of falls will improve by discharge Outcome: Progressing   Problem: SAFETY - MEDICAL RESTRAINT Goal: Remains free of injury from restraints (Restraint for Interference with Medical Device) Description: INTERVENTIONS: 1. Determine that other, less restrictive measures have been tried or would not be effective before applying the restraint 2. Evaluate the patient's condition at the time of restraint application 3. Inform patient/family regarding the reason for restraint 4. Q2H: Monitor safety, psychosocial status, comfort, nutrition and hydration Outcome: Progressing Goal: Free from restraint(s) (Restraint for Interference with Medical Device) Description: INTERVENTIONS: 1. ONCE/SHIFT or MINIMUM Q12H: Assess and document the continuing need for restraints 2. Q24H: Continued use of restraint requires LIP to perform face to face examination and written order 3. Identify and implement measures to help patient regain control Outcome: Progressing   Problem: Activity: Goal: Ability to tolerate increased activity will improve Description: Ability to tolerate increased activity will improve Outcome: Progressing   "

## 2024-01-10 NOTE — Discharge Summary (Signed)
 " INTERNAL MEDICINE HOSPITALIST [facility]  DISCHARGE SUMMARY:  Identifying Information:  Kristen Colon 07-Feb-1950 [id number]  Admit date: 01/05/2024  Discharge date: 01/10/2024   Discharge Service: [facility] Hospitalist  Discharge Attending Physician:Milton Sheena Butt, MD  Discharge to: Home with home health care  Discharge Diagnoses (all POA): Principal Problem (Resolved):   Acute on chronic respiratory failure with hypoxia and hypercapnia    (CMD) Active Problems:   Paranoid schizophrenia    (CMD)   Alcohol use disorder, severe, in sustained remission    (CMD)   Major neurocognitive disorder due to multiple etiologies (CMD)   History of tobacco abuse   Adenocarcinoma of ovary diagnosed 07/08/2017 (CMD)   Influenza A   Recurrent malignant ascites (CMD)   Deep vein thrombosis (DVT) of lower extremity 2023 (CMD)   Senile dementia (CMD)   Altered mental status   Moderate protein-calorie malnutrition (CMD)   Extra-axial collection adjacent to the lateral left cerebral hemisphere   Hospital Course:  Kristen Colon is a 73 year old female with a history of 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  On presentation, she was noted to be nearly nonverbal with increased confusion, dry cough, and urinary frequency. Initial evaluation revealed positive influenza A, and imaging demonstrated bilateral interstitial changes on chest X-ray and no acute findings on chest imaging. She was found to have an extra-axial collection on head CT, which prompted further evaluation with MRI brain. MRI revealed left supratentorial pachymeningeal enhancement, raising concern for either infectious or  neoplastic meningitis, with neurology consultation recommending lumbar puncture for definitive diagnosis. However, after improvement in mental status with oseltamivir and discussion with her daughter and the neurology team, the decision was made to forego further invasive workup.  Her acute on chronic respiratory failure was attributed to influenza A pneumonia, for which she received a 5-day course of oseltamivir and supplemental oxygen. She was successfully weaned off oxygen and maintained oxygen saturation of 99% on room air by discharge.  During the admission, her apixaban  was held temporarily in anticipation of a possible lumbar puncture, but was resumed after the procedure was deferred. She intermittently required a soft waist restraint for safety due to agitation and cognitive impairment. She was also evaluated by physical therapy, which documented significant functional decline and recommended hospice care rather than further rehabilitation.  Additional active issues during the hospitalization included management of her metastatic ovarian cancer with recurrent malignant ascites, major neurocognitive disorder, history of DVT, and moderate protein-calorie malnutrition related to chronic illness. She was noted to have muscle wasting and decreased oral intake, and nutrition support included Ensure Plus and encouragement of PO intake. Her code status was changed to DNR comfort care, and discharge planning was complicated by family concerns regarding home readiness and hospice acceptance, ultimately resulting in arrangement for home health services at discharge.  At the time of discharge, she was alert but minimally interactive, with persistent generalized weakness, muscle wasting, and poor oral intake, but was hemodynamically stable and free of acute distress. She was discharged home with home health services, as she did not meet criteria for home hospice at this time.    __   Discharge Day  Services: BP (!) 159/92 (BP Location: Right arm, Patient Position: Lying)   Pulse 68  Temp 97.9 F (36.6 C) (Oral)   Resp 18   Wt 62.1 kg (137 lb)   SpO2 100%   BMI 22.80 kg/m  Pt seen on the day of discharge and determined appropriate for discharge.   Condition at Discharge: poor  Length of Discharge: I spent 35 mins in the discharge of this patient. _____________________________________________________________________________ Discharge Medications: Patient Instructions:    Medication List     CONTINUE taking these medications    acetaminophen  500 mg tablet Commonly known as: TYLENOL  Take 500 mg by mouth every 6 (six) hours as needed for fever 100.4 F or GREATER (pain).   alum-mag hydroxide-simethicone  200-200-20 mg/5 mL Susp suspension Commonly known as: MAALOX, MYLANTA Take 30 mL by mouth every 4 (four) hours as needed.   apixaban  5 mg Tab Commonly known as: ELIQUIS  Take 5 mg by mouth 2 (two) times a day.   baclofen  10 mg tablet Commonly known as: LIORESAL  Take 10 mg by mouth daily as needed.   benztropine  0.5 mg tablet Commonly known as: COGENTIN  Take 1 tablet (0.5 mg total) by mouth nightly.   BisaCODYL  10 mg suppository Commonly known as: DULCOLAX Insert 10 mg into the rectum daily as needed.   buPROPion  150 mg 24 hr tablet Commonly known as: WELLBUTRIN  XL Take 150 mg by mouth daily.   calcium carbonate-vitamin D3 500 mg (200 mg Ca)-5 mcg (200 units Vit D) per tablet Take 1 tablet by mouth daily.   diazePAM  5 mg tablet Commonly known as: VALIUM  Take 1 tablet (5 mg total) by mouth daily for 10 days.   fentaNYL  75 mcg/hr patch Commonly known as: DURAGESIC  Place 1 patch on the skin every third day.   HYDROcodone -acetaminophen  5-325 mg per tablet Commonly known as: NORCO Take 1 tablet by mouth at bedtime.   Invega  Sustenna 234 mg/1.5 mL Syrg injection Generic drug: paliperidone  palmitate ADMINISTER 1.5 ML IN THE MUSCLE EVERY 21 DAYS    lactulose  10 gram/15 mL solution Commonly known as: CHRONULAC  Take 10 g by mouth 2 (two) times a day.   linaCLOtide  290 mcg Cap capsule Commonly known as: LINZESS  Take 290 mcg by mouth daily before breakfast.   memantine  5 mg tablet Commonly known as: NAMENDA  Take 1 tablet (5 mg total) by mouth 2 (two) times a day(morning and bedtime) (Take 1 tablet (5 mg total) by mouth 2 (two) times a day.)   metoprolol  tartrate 25 mg tablet Commonly known as: LOPRESSOR  Take 0.5 tablets by mouth 2 (two) times a day.   mirtazapine  15 mg tablet Commonly known as: REMERON  TAKE ONE TABLET BY MOUTH AT BEDTIME   Multi Complete with Iron 18-400 mg-mcg Tab tablet Generic drug: multivitamin-iron-folic acid  Take 1 tablet by mouth daily.   NON FORMULARY Take 1 capsule by mouth daily. Lions mane   omeprazole  20 mg Tbec Take 1 tablet by mouth daily.   ondansetron  4 mg tablet Commonly known as: ZOFRAN  Take 4 mg by mouth every 8 (eight) hours as needed for nausea.   risperiDONE  0.5 mg tablet Commonly known as: RisperDAL  TAKE ONE TABLET (0.5mg ) BY MOUTH TWICE DAILY AS NEEDED   VITAMIN C  ORAL Take 1 tablet by mouth daily.       STOP taking these medications    amLODIPine  5 mg tablet Commonly known as: NORVASC    glipiZIDE  5 mg tablet Commonly known as: GLUCOTROL    valACYclovir 1 gram tablet Commonly known as: VALTREX   Vicks NyQuil Severe Cold-Flu 6.25-5-10-325 mg Tab Generic drug: doxylamine-PE-DM-acetaminophen   _____________________________________________________________________________ Pending Test Results: @PRINTGROUPPENDLAB @  Most Recent Labs:     Lab Results  Component Value Date   WBC 5.38 01/06/2024   HGB 12.3 01/06/2024   HCT 37.6 01/06/2024   PLT 280 01/06/2024    Lab Results  Component Value Date   CO2 26 01/06/2024   BUN 6 (L) 01/06/2024   GLUCOSE 88 01/06/2024   CREATININE 0.66 01/06/2024   CALCIUM 8.6 01/06/2024   ALBUMIN  3.3 (L) 01/05/2024    AST 33 01/05/2024   ALT 22 01/05/2024    Lab Results  Component Value Date   NA 137 01/06/2024   K 4.0 01/06/2024   CL 106 01/06/2024   CO2 26 01/06/2024   BUN 6 (L) 01/06/2024   CREATININE 0.66 01/06/2024   CALCIUM 8.6 01/06/2024   MG 1.9 01/06/2024   PHOS 3.6 10/21/2023    Lab Results  Component Value Date   BILITOT 0.2 (L) 01/05/2024   PROT 6.5 01/05/2024   ALBUMIN  3.3 (L) 01/05/2024   ALT 22 01/05/2024   AST 33 01/05/2024    No results found for: PT, INR, APTT Hospital Radiology:  MRI Brain W And WO Contrast Addendum Date: 01/06/2024 Date of Service: 2024-01-05 16:16:00 -----ADDENDUM-----: Receipt of this report by the clinical staff was confirmed with Sherril Frost RN by Baruch Bence on Jan 06, 2024 03:13:00 EST. Electronically signed by: Baruch Bence on 01/06/2024 03:14:43 AM US [facility] -----ORIGINAL REPORT-----: EXAM: MR Brain with and without Intravenous Contrast. CLINICAL HISTORY: Evaluate abnormalities on CT scan. TECHNIQUE: Magnetic resonance images of the brain with and without intravenous contrast in multiple planes. CONTRAST: With and without; Contrast: 6mL Gad  COMPARISON:  CT/SR - HEAD,HEAD (ADULT) - 01/05/24 12:57 EST  FINDINGS: BRAIN: There is an extra-axial collection adjacent to the lateral left cerebral hemisphere, most pronounced adjacent to the left parietal lobe measuring approximately 3 mm thickness. This is bright on FLAIR images. This is a mildly bright on T2 weighted images. This is slightly dark on T1 weighted images. There is no appreciable blooming artifact on SWI images images that would be expected with hemorrhage. There is enhancement on postcontrast T1 weighted images. This does not have the typical imaging characteristics of a subdural hematoma, abscess, or subdural hygroma. This is worrisome for thick pachymeningeal enhancement. This could represent a chronic process as there is no adjacent brain edema. Infectious or  neoplastic meningitis is not excluded. There is no restricted diffusion that would suggest an acute ischemic infarct. There is thinning of the corpus callosum. The cerebellar tonsils terminate at the level of the foramen magnum in a normal fashion. The flow voids of the large arteries of the brain are grossly intact. There is no enhancing mass within the brain. There is no brain edema. There is no significant midline shift. Chronic atrophy and chronic white matter changes are noted. VENTRICLES: Chronic ventriculomegaly is noted. ORBITS: The globes and orbital contents are within normal limits. SINUSES AND MASTOIDS: Paranasal sinuses and mastoid air cells are clear. BONES: No acute fracture or focal osseous lesion. IMPRESSION: Left supratentorial extra-axial abnormality with differential considerations as detailed above. This needs follow-up. Electronically signed by: Garnette Beaver, DO on 01/06/2024 03:09:31 AM [facility]  Result Date: 01/06/2024 Date of Service: 2024-01-05 16:16:00 EXAM: MR Brain with and without Intravenous Contrast. CLINICAL HISTORY: Evaluate abnormalities on CT scan. TECHNIQUE: Magnetic resonance images of the brain with and without intravenous contrast in multiple planes. CONTRAST: With and without; Contrast: 6mL Gad  COMPARISON:  CT/SR - HEAD,HEAD (ADULT) -  01/05/24 12:57 EST  FINDINGS: BRAIN: There is an extra-axial collection adjacent to the lateral left cerebral hemisphere, most pronounced adjacent to the left parietal lobe measuring approximately 3 mm thickness. This is bright on FLAIR images. This is a mildly bright on T2 weighted images. This is slightly dark on T1 weighted images. There is no appreciable blooming artifact on SWI images images that would be expected with hemorrhage. There is enhancement on postcontrast T1 weighted images. This does not have the typical imaging characteristics of a subdural hematoma, abscess, or subdural hygroma. This is worrisome for thick  pachymeningeal enhancement. This could represent a chronic process as there is no adjacent brain edema. Infectious or neoplastic meningitis is not excluded. There is no restricted diffusion that would suggest an acute ischemic infarct. There is thinning of the corpus callosum. The cerebellar tonsils terminate at the level of the foramen magnum in a normal fashion. The flow voids of the large arteries of the brain are grossly intact. There is no enhancing mass within the brain. There is no brain edema. There is no significant midline shift. Chronic atrophy and chronic white matter changes are noted. VENTRICLES: Chronic ventriculomegaly is noted. ORBITS: The globes and orbital contents are within normal limits. SINUSES AND MASTOIDS: Paranasal sinuses and mastoid air cells are clear. BONES: No acute fracture or focal osseous lesion.   Left supratentorial extra-axial abnormality with differential considerations as detailed above. This needs follow-up. Electronically signed by: Garnette Beaver, DO on 01/06/2024 03:09:31 AM [facility]  XR Abdomen 1 View Result Date: 01/05/2024 Date of Service: 2024-01-05 21:45:00 EXAM: XR Abdomen, AP View. CLINICAL HISTORY: For MRI clearance. COMPARISON: None provided.  FINDINGS: LUNG BASES: Lung bases clear where seen. BOWEL: Moderate to large amount of stool is seen in the colon. Mild gaseous distension of the bowel and colon noted. No free air. PERITONEUM/SOFT TISSUES: No appreciable free air. No abnormal calcifications. BONES: No acute osseous abnormality. MISCELLANEOUS: No metallic structure seen within the field of view that would preclude MRI.    No metallic structure seen within the field of view that would preclude MRI. Electronically signed by: Garnette Beaver, DO on 01/05/2024 11:32:59 PM US [facility]  CT Head WO Contrast W Quant CT Tiss Character When Performed Result Date: 01/05/2024 CT HEAD WITHOUT CONTRAST, 01/05/2024 1:02 PM INDICATION: Altered mental status  COMPARISON: CT head 09/03/2009 TECHNIQUE: Axial CT images of the brain from skull base to vertex, including portions of the face and sinuses, were obtained without contrast. Supplemental 2D reformatted images were generated and reviewed as needed. All CT scans at [facility] and [facility] are performed using radiation dose optimization techniques as appropriate to a performed exam, including but not limited to one or more of the following: automatic exposure control, adjustment of the mA and/or kV according to patient size, use of iterative reconstruction technique. In addition, our institution participates in a radiation dose monitoring program to optimize patient radiation exposure. FINDINGS: Calvarium/skull base: No evidence of acute fracture or destructive lesion. Mastoids and middle ears demonstrate no substantial mucosal disease. Edentulous. Likely remote blowout fracture involving the medial wall and floor of the left orbit. Paranasal sinuses: No air fluid levels. Brain: Small hypoattenuating subdural collection along the left parietal cerebral convexity measuring 2-3 mm in maximal thickness. No significant associated mass effect. Interval ventricular enlargement which may be partly ex vacuo but is out of proportion to cerebral volume loss with mild crowding of the sulci at the apex, widening of the  sylvian fissures bilaterally, and an acute pericallosal angle. No acute large vascular territory infarct. No acute hemorrhage. Sequelae of chronic small vessel disease with age-appropriate global cerebral volume loss. Intracranial atherosclerosis. Remote appearing left basal ganglia infarct.   1.  Small left cerebral convexity hypoattenuating subdural collection which could represent subdural empyema if the patient has meningitis. In the absence of infection, likely small chronic subdural hematoma or hygroma. MRI brain with and without could further evaluate if clinically  indicated. 2.  Interval enlargement of ventricular system which may be in part due to ex vacuo expansion from progressive volume loss but appears out of proportion to degree of cerebral atrophy. Differential includes normal pressure hydrocephalus in the appropriate clinical setting or hydrocephalus secondary to meningitis. 3.  No acute intracranial hemorrhage. COMMUNICATION: Findings discussed with Dr. Franky Fleischer by Dr. Marvine via telephone on 01/05/2024 2:00 PM.  XR Chest 1 View Result Date: 01/05/2024 XR CHEST 1 VIEW, 01/05/2024 11:17 AM INDICATION:altered mental status COMPARISON: Chest radiograph 10/16/2023 FINDINGS: Supportive devices: None Cardiovascular/lungs/pleura: Cardiac silhouette and pulmonary vasculature are within normal limits. Similar diminished bilateral lung volumes with resultant accentuation of vascular markings. Again noted diffuse bilateral interstitial thickening. No confluent opacity. No pleural effusion or pneumothorax. Other: No interval osseous change.   Similar diffuse bilateral interstitial thickening, may represent mild pulmonary interstitial edema versus scattered atelectasis secondary to low lung volumes.   ECG 12 lead Result Date: 01/05/2024 Ventricular Rate                   73        BPM                 Atrial Rate                        73        BPM                 P-R Interval                       198       ms                  QRS Duration                       76        ms                  Q-T Interval                       382       ms                  QTC Calculation Bazett             420       ms                  Calculated P Axis                  66        degrees             Calculated R Axis                  -20       degrees  Calculated T Axis                  16        degrees             Sinus rhythm Normal ECG When compared with ECG of 16-Oct-2023 16 15, No significant change was found Confirmed by Rojelio Dunnings  [phone]  on 01-05-2024 12 53 47  PM  US  Guided Abdominal Paracentesis Result Date: 01/04/2024 INDICATION: History of metastatic ovarian cyst adenocarcinoma with recurrent ascites, likely malignant. Request for repeat paracentesis.  EXAM: ULTRASOUND GUIDED  PARACENTESIS  MEDICATIONS: 1% Lidocaine  5 mL  COMPLICATIONS: None immediate  PROCEDURE:  Informed written consent was obtained from the patient after a discussion of the risks, benefits and alternatives to treatment. A timeout was performed prior to the initiation of the procedure.  Initial ultrasound scanning demonstrates a large amount of ascites within the left lower abdominal quadrant. The left lower abdomen was prepped and draped in the usual sterile fashion. 1% lidocaine  was used for local anesthesia.  Following this, a Safe T Centesis catheter was introduced. An ultrasound image was saved for documentation purposes. The paracentesis was performed. The catheter was removed and a dressing was applied. The patient tolerated the procedure well without immediate post procedural complication.  FINDINGS:  A total of approximately 3.75 L of bloody ascitic fluid was removed.     Successful ultrasound-guided paracentesis yielding 3.75 L of peritoneal fluid.  Procedure performed by Franky Rusk, PA-C and supervised by Dr. Gordy Roulette.   US  Guided Abdominal Paracentesis Result Date: 12/11/2023 This result has an attachment that is not available. INDICATION: ascites History of recurrent malignant ascites. Request for therapeutic paracentesis with 5 L maximum. EXAM: ULTRASOUND GUIDED THERAPEUTIC PARACENTESIS MEDICATIONS: 5 mL 1% lidocaine  COMPLICATIONS: None immediate. PROCEDURE: Informed written consent was obtained from the patient after a discussion of the risks, benefits and alternatives to treatment. A timeout was performed prior to the initiation of the procedure. Initial ultrasound scanning demonstrates a moderate amount of ascites within the left lower abdominal quadrant. The left  lower abdomen was prepped and draped in the usual sterile fashion. 1% lidocaine  was used for local anesthesia. Following this, a 19 gauge, 7-cm, Yueh catheter was introduced. An ultrasound image was saved for documentation purposes. The paracentesis was performed. The catheter was removed and a dressing was applied. The patient tolerated the procedure well without immediate post procedural complication. FINDINGS: A total of approximately 5.0 L of blood tinged fluid was removed. IMPRESSION: Successful ultrasound-guided paracentesis yielding 5.0 L of peritoneal fluid. Performed by: Wyatt Pommier, PA-C under direct supervision of Thom Hall, MD Electronically Signed   By: Thom Hall M.D.   On: 12/11/2023 13:56   _____________________________________________________________________________ Discharge Instructions:   Discharge Orders     CNA Home Health Coordination     DNAR per Portable DNAR     Home Health Skilled Nursing     Details:    Home Health Skilled Nursing Service:  Medication Education     Education Needed: Disease Process Education   Hospice Coordination     Details:    Actions: Appropriate for Hospice Home Health Services   Comments: Patients daughter requesting to start home hospice services again   Lifting Limits:     Details:    Lifting Limits: No lifting limits   Lifting Limits:     Details:    Lifting Limits: No lifting limits   Occupational Therapy Home Health Coordination  Details:    Actions:  Evaluate and Treat Home Safety Evaluation     Physical Therapy Home Health Coordination     Details:    Actions:  Evaluate and Treat Home Safety Evaluation         @PATFUTUREAPPT @      "

## 2024-01-11 ENCOUNTER — Telehealth: Payer: Self-pay

## 2024-01-11 ENCOUNTER — Encounter: Payer: Self-pay | Admitting: Family Medicine

## 2024-01-11 ENCOUNTER — Telehealth (INDEPENDENT_AMBULATORY_CARE_PROVIDER_SITE_OTHER): Admitting: Family Medicine

## 2024-01-11 VITALS — BP 115/65 | HR 64 | Temp 99.7°F | Ht 62.0 in

## 2024-01-11 DIAGNOSIS — J9621 Acute and chronic respiratory failure with hypoxia: Secondary | ICD-10-CM

## 2024-01-11 DIAGNOSIS — R180 Malignant ascites: Secondary | ICD-10-CM

## 2024-01-11 DIAGNOSIS — C569 Malignant neoplasm of unspecified ovary: Secondary | ICD-10-CM

## 2024-01-11 NOTE — Progress Notes (Unsigned)
" ° °  Acute Office Visit  Subjective:     Patient ID: Kristen Colon, female    DOB: [birth year], 73 y.o.   MRN: [id number]  Chief Complaint  Patient presents with   Medical Management of Chronic Issues    HPI Provider location: [facility] [place] Pt location: home Method of visit: video Duration: 35 min  Patient is in today for   Hypoxia following recent hospitalization for influenza.   Pt  has been experiencing chest pain and significant oxygen desaturation,per the daughter, with levels dropping from 88% to as low as 79%. she was recently hospitalized for a week where he was on oxygen but was weaned off before discharge. Since returning home, she has been wheezing and coughing persistently.  Home o2 sensor was reading 88%.   There is difficulty in obtaining home oxygen due to insurance requirements, which demand documentation of low oxygen levels at home. These were not required when getting her oxygen through hospice.   The pt was denied hospice referral at discharge.  Daughter is unsure of why.  She has not heard back from [facility] in the past month regarding previous palliative referral.  She states she has 24 hr care bw her and caregivers.      ROS      Objective:    BP 115/65   Pulse 64   Temp 99.7 F (37.6 C)   Ht 5' 2 (1.575 m)   LMP  (LMP Unknown) Comment: years ago per Pt  SpO2 (!) 86%   BMI 25.06 kg/m    Physical Exam Gen: alert, oriented to self.  Answered basic questions appropriately but most of encounter was spent talking with daughter.  Pulm: no respiratory distress. Oxygen was 88% visualized over telemedicine while pt was sitting upright.  Psych: pleasant affect  No results found for any visits on 01/11/24.      Assessment & Plan:   Acute on chronic hypoxic respiratory failure (HCC) Assessment & Plan: Oxygen saturation at 88%, previously 79%. Requires home oxygen therapy. Insurance requires documentation which is not available from her previous  home oxygen order, therefore she has not been able to receive it.[facility] discharge summary showed 99%, complicating approval. Today over virtual visit it was 88% while sitting in her chair.  Discussed with daughter how at 88% or lower it would be best to be evaluated in the hospital.  Will continue to work on getting home oxygen.  Daughter is aware it may take time.  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  I cannot find documentation from her hospitalization why hospice was not ordered at discharge. Pt daughter having difficulties managing her complex care since leaving hospice prior to establishing care here. Will call [facility] with whom a palliative referral was previously placed.    Orders: -     Ambulatory referral to Hospice  Malignant ascites (HCC) -     Ambulatory referral to Hospice  Other orders -     Cefdinir ; Take 1 capsule (300 mg total) by mouth 2 (two) times daily for 5 days.  Dispense: 10 capsule; Refill: 0     No follow-ups on file.  Toribio MARLA Slain, MD  "

## 2024-01-11 NOTE — Telephone Encounter (Signed)
Order has been faxed to the below number.

## 2024-01-11 NOTE — Telephone Encounter (Signed)
 Contacted [facility] palliative care department and they stated that if the pt is wanting to have hospice, then we would need to resend this to hospice.  Referral was changed and message sent to referral team to get this sent.

## 2024-01-11 NOTE — Transitions of Care (Post Inpatient/ED Visit) (Signed)
 "  01/11/2024  Name: Kristen Colon MRN: [id number] DOB: [birth year]  Today's TOC FU Call Status: Today's TOC FU Call Status:: Successful TOC FU Call Completed TOC FU Call Complete Date: 01/11/24  Patient's Name and Date of Birth confirmed. Name, DOB  Transition Care Management Follow-up Telephone Call Date of Discharge: 01/10/24 Discharge Facility: Other ([facility]) Name of Other ([facility]) Discharge Facility: [facility] Type of Discharge: Inpatient Admission Primary Inpatient Discharge Diagnosis:: Acute on chronic respiratory failure with hypoxia and hypercapnia How have you been since you were released from the hospital?: Worse Any questions or concerns?: Yes Patient Questions/Concerns:: concern about mothers cough and weakness, daughter does not know if hospice or home health Patient Questions/Concerns Addressed: Notified Provider of Patient Questions/Concerns (secured chat sent to PCP)  Items Reviewed: Did you receive and understand the discharge instructions provided?: Yes Medications obtained,verified, and reconciled?: Yes (Medications Reviewed) Any new allergies since your discharge?: No Dietary orders reviewed?: NA Do you have support at home?: Yes Name of Support/Comfort Primary Source: daughter is legal POA  Medications Reviewed Today: Medications Reviewed Today     Reviewed by Rumalda Alan PENNER, RN (Registered Nurse) on 01/11/24 at 1048  Med List Status: <None>   Medication Order Taking? Sig Documenting Provider Last Dose Status Informant  albuterol  (ACCUNEB ) 0.63 MG/3ML nebulizer solution [id number]  Take 1 ampule by nebulization every 6 (six) hours as needed for wheezing or shortness of breath.  Patient not taking: Reported on 01/11/2024   [provider]  Active Child, Pharmacy Records  amLODipine  (NORVASC ) 5 MG tablet [id number]  Take 1 tablet (5 mg total) by mouth daily.  Patient not taking: Reported on 01/11/2024   Chandra Toribio POUR, MD  Active    apixaban  (ELIQUIS ) 5 MG TABS tablet [id number] Yes Take 5 mg by mouth 2 (two) times daily. [provider]  Active   baclofen  (LIORESAL ) 10 MG tablet [id number] Yes Take 10 mg by mouth daily.  Patient taking differently: Take 10 mg by mouth daily. Prescribed as needed on 1221/2025 by [facility]   [provider]  Active   benztropine  (COGENTIN ) 0.5 MG tablet [id number] Yes Take 1 tablet (0.5 mg total) by mouth at bedtime. Chandra Toribio POUR, MD  Active   bisacodyl  (DULCOLAX) 10 MG suppository [id number] Yes Place 10 mg rectally as needed for moderate constipation. [provider]  Active   buPROPion  (WELLBUTRIN  XL) 150 MG 24 hr tablet [id number] Yes Take 1 tablet (150 mg total) by mouth daily. Chandra Toribio POUR, MD  Active   Calcium Carb-Cholecalciferol  (CALCIUM CARBONATE-VITAMIN D3 PO) [id number] Yes Take 1 tablet by mouth daily. [provider]  Active   diazepam  (VALIUM ) 5 MG tablet [id number] Yes Take 1 tablet (5 mg total) by mouth at bedtime. Chandra Toribio POUR, MD  Active   DM-Doxylamine-Acetaminophen  Lifescape COLD & FLU) 15-6.25-325 MG CAPS [id number] Yes Take 5 mLs by mouth every 12 (twelve) hours. As needed per discharge instruction at [facility] on 1221/2025 [provider]  Active   glipiZIDE  (GLUCOTROL ) 5 MG tablet [id number] Yes Take 1 tablet (5 mg total) by mouth daily before breakfast. Chandra Toribio POUR, MD  Active   lactulose  (CHRONULAC ) 10 GM/15ML solution [id number] Yes Take 15 mLs (10 g total) by mouth 2 (two) times daily as needed for mild constipation. Chandra Toribio POUR, MD  Active   linaclotide  (LINZESS ) 290 MCG CAPS capsule [id number] Yes Take 1 capsule (290 mcg total) by mouth daily before breakfast. Chandra Toribio POUR, MD  Active   memantine  (NAMENDA ) 5 MG tablet [id number] Yes Take 1 tablet (5 mg total) by mouth 2 (two) times daily. Chandra Toribio POUR, MD  Active   metoprolol  tartrate (LOPRESSOR ) 25 MG tablet [id number] Yes Take 0.5 tablets (12.5 mg total)  by mouth 2 (two) times daily. Chandra Toribio POUR, MD  Active   mirtazapine  (REMERON ) 15 MG tablet [id number] Yes Take 1 tablet (15 mg total) by mouth at bedtime. Chandra Toribio POUR, MD  Active   Multiple Vitamins-Minerals [facility] COMPLETE/IRON PO) [id number] Yes Take 1 tablet by mouth daily at 6 (six) AM. [provider]  Active   omeprazole  (PRILOSEC) 20 MG capsule [id number] Yes Take 1 capsule (20 mg total) by mouth daily. Chandra Toribio POUR, MD  Active   ondansetron  (ZOFRAN ) 4 MG tablet [id number] Yes Take 4 mg by mouth every 8 (eight) hours as needed for nausea or vomiting. [provider]  Active   paliperidone  (INVEGA  SUSTENNA) 234 MG/1.5ML injection [id number] Yes Inject 234 mg into the muscle every 21 ( twenty-one) days. Chandra Toribio POUR, MD  Active   risperiDONE  (RISPERDAL  M-TABS) 0.5 MG disintegrating tablet [id number] Yes Take 0.5 mg by mouth 2 (two) times daily. As needed per discharge instructions - [facility] on 1221/2025 [provider]  Active   UNABLE TO FIND [id number] Yes Take 1 capsule by mouth daily. Med Name: lions mane [provider]  Active   valACYclovir (VALTREX) 1000 MG tablet [id number] Yes Take 1,000 mg by mouth 3 (three) times daily. [provider]  Active   VITAMIN C , CALCIUM ASCORBATE, PO [id number] Yes Take 1 tablet by mouth daily at 6 (six) AM. [provider]  Active   Med List Note Leobardo Garre, CPhT 06/22/23 0631): [phone] [facility]            Home Care and Equipment/Supplies: Were Home Health Services Ordered?: Yes Name of Home Health Agency:: Uncertain if [facility] or [facility] Has Agency set up a time to come to your home?: Yes First Home Health Visit Date: 01/12/24 Any new equipment or medical supplies ordered?: No  Functional Questionnaire: Do you need assistance with bathing/showering or dressing?: Yes Do you need assistance with meal preparation?: Yes Do you need assistance with eating?:  Yes Do you have difficulty maintaining continence: Yes Do you need assistance with getting out of bed/getting out of a chair/moving?: Yes Do you have difficulty managing or taking your medications?: Yes  Follow up appointments reviewed: PCP Follow-up appointment confirmed?: Yes Date of PCP follow-up appointment?: 01/11/24 Follow-up Provider: virtual visit with PCP based on secure chat with concerns from dughter. Specialist Hospital Follow-up appointment confirmed?: No Reason Specialist Follow-Up Not Confirmed: Patient has Specialist Provider Number and will Call for Appointment Do you need transportation to your follow-up appointment?: No (unknown- virtual visit) Do you understand care options if your condition(s) worsen?: Yes-patient verbalized understanding (reviewed with daughter to call 911 for any changes and concerns that she has about breathing.)  Unable to compete assessment as the daughter is sick as well and states that she can not talk anymore.  Daughter is very confused about what patient needs.  Daughter reports that patient was on hospice for 8 years and then had home health.  Daughter requested hospice while admitted at [facility].  Daughter reports patient was denied hospice.   Daughter states that she has caregivers 7 days per week.   Daughter reports that patient is so weak at this point that patient can not walk.  Daughter wants a nurse to see patient today an wants a virtual visit with PCP. All medications were updated as per discharge instructions from [facility]. I verbally reviewed these with daughter.  Daughter wants an antibiotic and neb/inhaler. I reviewed with daughter that these were not ordered and patient completed tamiflu course while inpatient. PCP aware.   I was able to secure chat MD and he will see patient virtually at 210 today.  Thanks Dr. Chandra. Daughter is aware. I will follow up after virtual visit with daughter tomorrow.  Alan Ee, RN, BSN, CEN [facility]  [facility]- Transition of Care Team.  [facility] [phone]    "

## 2024-01-11 NOTE — Assessment & Plan Note (Signed)
 Attempting to get pt restarted with hospice.  I cannot find documentation from her hospitalization why hospice was not ordered at discharge. Pt daughter having difficulties managing her complex care since leaving hospice prior to establishing care here. Will call [facility] with whom a palliative referral was previously placed.

## 2024-01-11 NOTE — Assessment & Plan Note (Signed)
 Oxygen saturation at 88%, previously 79%. Requires home oxygen therapy. Insurance requires documentation which is not available from her previous home oxygen order, therefore she has not been able to receive it.[facility] discharge summary showed 99%, complicating approval. Today over virtual visit it was 88% while sitting in her chair.  Discussed with daughter how at 88% or lower it would be best to be evaluated in the hospital.  Will continue to work on getting home oxygen.  Daughter is aware it may take time.  -Cefdinir  bid sent in for 5 days

## 2024-01-12 ENCOUNTER — Telehealth: Payer: Self-pay | Admitting: *Deleted

## 2024-01-12 ENCOUNTER — Telehealth: Payer: Self-pay

## 2024-01-12 ENCOUNTER — Other Ambulatory Visit: Payer: Self-pay

## 2024-01-12 ENCOUNTER — Telehealth: Payer: Self-pay | Admitting: Family Medicine

## 2024-01-12 ENCOUNTER — Other Ambulatory Visit: Payer: Self-pay | Admitting: Family Medicine

## 2024-01-12 MED ORDER — ALBUTEROL SULFATE 0.63 MG/3ML IN NEBU: 1.0000 | INHALATION_SOLUTION | Freq: Four times a day (QID) | RESPIRATORY_TRACT | 2 refills | Status: AC | PRN

## 2024-01-12 MED ORDER — CEFDINIR 300 MG PO CAPS: 300.0000 mg | ORAL_CAPSULE | Freq: Two times a day (BID) | ORAL | 0 refills | Status: AC

## 2024-01-12 NOTE — Telephone Encounter (Unsigned)
 Copied from CRM #[id number]. Topic: Clinical - Medical Advice >> Jan 12, 2024 11:16 AM Tiffini S wrote: Reason for CRM:  Tonya with [facility] is set for a hospice referral and will have a assessment visit on 01/13/24- asking for pcp to be the attending of records to sign orders of 6 months or less left if terminal illness goes as expected  Please call to discuss [phone]- will need a call back/  have 48 hours after the assessment visit is completed to sign per Medicare requirement

## 2024-01-12 NOTE — Telephone Encounter (Signed)
 Just spoke to Tonya at [facility] and let her know that Dr. Chandra is agreeable to be the attending.

## 2024-01-12 NOTE — Telephone Encounter (Signed)
 Copied from CRM #[id number]. Topic: Clinical - Medical Advice >> Jan 12, 2024 12:27 PM Emylou G wrote: Reason for CRM: Nat w/[facility] [phone] has low oxygen stats on the bed and daughter keeps having to adjust her on the bed.. Can we get oxygen tank?  Was recently discharged from the hospital.

## 2024-01-12 NOTE — Telephone Encounter (Signed)
 Informed pt that O2 and nebulizer were ordered and she said that it had been delivered and just needed the medicine for the nebulizer sent to pharmacy, askedf provider about medication for cough and he recommended Mucinex

## 2024-01-12 NOTE — Telephone Encounter (Signed)
 Copied from CRM [id number]. Topic: Clinical - Order For Equipment >> Jan 11, 2024  5:05 PM Hadassah PARAS wrote: Reason for CRM: Pt's daughter is calling to fu on hospice referall and oxygen tank. She is needing the oxgyen ASAP. Spoke w CAL advised hospice has received referall and is being processed. Regarding oxygen, CAL advised they will be giving her a call later today or tomorrow with an update. Please advise daughter on #[phone]

## 2024-01-12 NOTE — Transitions of Care (Post Inpatient/ED Visit) (Signed)
 01/12/2024  Patient ID: Kristen Colon, female   DOB: 1950/06/26, 73 y.o.   MRN: [id number]  1100 Spoke with Tonya at [facility] and she confirms referral received.  She is getting ready to call daughter. 1140  reviewed notes in EPIC and patient was accepted and home visit is scheduled for tomorrow.   1150 Placed call to daughter and she is not able to talk and reports that she will call me back.   3pm.  No call back from daughter.  I left a detailed message for daughter about hospice appointment tomorrow and encouraged daughter to acall me back if needed.  Unable to completed all of TOC assessments due to daughter being sick on initial call and no call back. Alan Ee, RN, BSN, CEN [employer]- Transition of Care Team.  [facility] [phone]

## 2024-01-12 NOTE — Telephone Encounter (Signed)
 Contacted nicole and told her that O2 and nebulizer are in the works currently through [employer]

## 2024-01-18 ENCOUNTER — Telehealth: Payer: Self-pay | Admitting: *Deleted

## 2024-01-18 NOTE — Telephone Encounter (Signed)
 Copied from CRM #[id number]. Topic: General - Other >> Jan 18, 2024  4:44 PM Everette C wrote: Reason for CRM: The patient's daughter has called to request that their letter for their adult day center be submitted via fax for the patient's resumption of care with multiple facilities [phone] Attn Manager   [facility] [phone] Attention Efrain   [phone] Attention [place]

## 2024-01-19 ENCOUNTER — Telehealth: Payer: Self-pay | Admitting: Family Medicine

## 2024-01-19 ENCOUNTER — Other Ambulatory Visit: Payer: Self-pay | Admitting: Family Medicine

## 2024-01-19 MED ORDER — TRAMADOL HCL 50 MG PO TABS: 50.0000 mg | ORAL_TABLET | Freq: Four times a day (QID) | ORAL | 0 refills | Status: AC | PRN

## 2024-01-19 NOTE — Telephone Encounter (Signed)
 Just spoke with Rock and she is requesting a letter stating that the pt is medically cleared to be able to return to the adult day program after her recent HOS stay. Just a letter on [employer].

## 2024-01-19 NOTE — Telephone Encounter (Signed)
 Forms have been Faxed to all 3 locations Attn: Kyra Attn: Manager Attn: [place]

## 2024-01-19 NOTE — Telephone Encounter (Unsigned)
 Copied from CRM #[id number]. Topic: Clinical - Medication Refill >> Jan 19, 2024  1:00 PM Zebedee SAUNDERS wrote: Medication: Tramadol   Has the patient contacted their pharmacy? Yes (Agent: If no, request that the patient contact the pharmacy for the refill. If patient does not wish to contact the pharmacy document the reason why and proceed with request.) (Agent: If yes, when and what did the pharmacy advise?)  This is the patient's preferred pharmacy:   [facility] [address] [place], [place] [address] [phone]  Is this the correct pharmacy for this prescription? Yes If no, delete pharmacy and type the correct one.   Has the prescription been filled recently? Yes  Is the patient out of the medication? Yes  Has the patient been seen for an appointment in the last year OR does the patient have an upcoming appointment? Yes  Can we respond through MyChart? Yes  Agent: Please be advised that Rx refills may take up to 3 business days. We ask that you follow-up with your pharmacy.

## 2024-01-19 NOTE — Telephone Encounter (Signed)
 Can you fax the letter to the numbers listed.  It should be 3 different places.

## 2024-01-19 NOTE — Telephone Encounter (Signed)
 I'm not sure what she is requesting.  We haven't talked about a letter for adult day care centers and I don't see any in her chart.  Does the letter need to say that she is medically stable to go to these centers or is there something else it needs to say as well?

## 2024-01-19 NOTE — Telephone Encounter (Signed)
"  Unable to pend  "

## 2024-01-26 ENCOUNTER — Telehealth: Payer: Self-pay

## 2024-01-26 NOTE — Telephone Encounter (Signed)
 Copied from CRM [id number]. Topic: General - Other >> Jan 26, 2024  2:03 PM Winona R wrote: Pt daughter calling to request that her mom have the invega  injection done in office and is also requesting a Cardiovascular referral for blood clot in leg. Its ok to leave voicemail.

## 2024-01-27 NOTE — Telephone Encounter (Signed)
 Can you see when the last time she was given invega  by anyone? The last one I can find is from her nov 25th visit.    Also let them know we can discuss any referrals at the visit later this month.

## 2024-01-27 NOTE — Telephone Encounter (Signed)
 LVM asking about the below information and informed them to call and give us  this information and we could schedule a nurse visit to have this completed if they haven't had it completed by anyone since the one in November.

## 2024-01-29 ENCOUNTER — Telehealth: Payer: Self-pay | Admitting: *Deleted

## 2024-01-29 NOTE — Telephone Encounter (Signed)
 I see where pt has an appt scheduled for this so will close this encounter.

## 2024-01-29 NOTE — Telephone Encounter (Unsigned)
 Copied from CRM #[id number]. Topic: Clinical - Medication Question >> Jan 29, 2024 10:30 AM Selinda RAMAN wrote: Reason for CRM: [facility] with [facility] called in stating the patient has some questions about medications including Eliquis  and Albuterol  and would like a call back form a nurse to discuss. Please assist patient further

## 2024-02-01 ENCOUNTER — Other Ambulatory Visit: Payer: Self-pay | Admitting: Family Medicine

## 2024-02-01 MED ORDER — ALBUTEROL SULFATE HFA 108 (90 BASE) MCG/ACT IN AERS: 2.0000 | INHALATION_SPRAY | RESPIRATORY_TRACT | 5 refills | Status: AC | PRN

## 2024-02-01 NOTE — Telephone Encounter (Signed)
 Contacted pt daughter about the below message.  She wants  to know if pt should have an albuterol  inhaler, they had given her one when she was in the hospital and if so should she use both the nebulizer and the inhaler? She also is requesting a referral for cardiology to check her out from the previous blood clot that she had.  She is also requesting a DME order for a recliner.  Routing to PCP, please advise.

## 2024-02-01 NOTE — Telephone Encounter (Signed)
 I will send in the albuterol . will talk to her about the other issues at her appointment in a few weeks

## 2024-02-02 ENCOUNTER — Ambulatory Visit: Admitting: Family Medicine

## 2024-02-02 ENCOUNTER — Other Ambulatory Visit: Payer: Self-pay | Admitting: Family Medicine

## 2024-02-02 ENCOUNTER — Ambulatory Visit

## 2024-02-02 VITALS — BP 122/70 | HR 90 | Ht 62.0 in | Wt 142.0 lb

## 2024-02-02 DIAGNOSIS — F203 Undifferentiated schizophrenia: Secondary | ICD-10-CM | POA: Diagnosis not present

## 2024-02-02 MED ORDER — PALIPERIDONE PALMITATE ER 234 MG/1.5ML IM SUSY
234.0000 mg | PREFILLED_SYRINGE | Freq: Once | INTRAMUSCULAR | Status: AC
  Administered 2024-02-02: 234 mg via INTRAMUSCULAR

## 2024-02-02 NOTE — Progress Notes (Signed)
 Patient is her for her Invega  injection was given on her her left arm pt tolerated this injection w/o complication advised to schedule next appt.

## 2024-02-10 NOTE — Discharge Summary (Signed)
 HP2 Discharge Summary  Name: SHEALYNN SAULNIER MRN: [id number] Age: 74 yrs DOB: February 18, 1950  Admit date: 02/08/2024 Discharge date and time: 02/11/23  Admitting Physician: Charlie Shown Burman Raddle., DO Discharge Physician: Dr. 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 Briefly, Philippe JULIANNA. 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 On admission, she was afebrile and without infectious symptoms; laboratory evaluation for altered mental status was unrevealing, including negative leukocytosis, lactate, ammonia, VBG, TSH, and urinalysis. Imaging revealed large cystic  adnexal masses consistent with malignancy, large volume ascites, and moderate to severe right hydroureteronephrosis secondary to extrinsic compression, but no acute intracranial abnormalities or evidence of acute infection. CXR without evidence of pneumonia.  Her altered mental status and functional decline were attributed to her advanced malignancy and polypharmacy, particularly sedating and mentation-altering medications, with additional contribution from constipation and delirium. Palliative care was consulted for goals of care and hospice clarification, and her daughter expressed a preference for home hospice, of note patient has been established with [facility] hospice outpatient, [facility] requested no inpatient palliative consult and that their team would continue conversations with patient/daughter given prior relationship. The team deferred further workup, including MRI, and held antibiotics given the absence of infectious signs. Interventional radiology was consulted for therapeutic paracentesis; the patient initially refused the procedure on 02/09/2024, but subsequently underwent paracentesis on 02/10/2024 with removal of 5 liters of bloody fluid from the left lower quadrant, which she tolerated well.  During the admission, she received a spot dose of haloperidol  for agitation and was able to eat breakfast and answer questions appropriately on 02/09/2024. She was also evaluated for right hydronephrosis. Urology was consulted and recommended consideration of IR nephrostomy tube placement if pain became uncontrolled or renal function declined, but no urgent intervention was required as renal function remained stable. She continued to experience significant abdominal distention related to her malignancy and ascites, and her bowel regimen was managed with lactulose . Discharge planning was coordinated with [facility] hospice, with the plan for her to return home with hospice services once medically  stable.   On day of discharge, patient is clinically stable with no new examination findings or acute symptoms compared to prior.  The patient was seen by the attending physician on the date of discharge and deemed stable and acceptable for discharge.  The patient's chronic medical conditions were treated accordingly per the patient's home medication regimen.  The patient's medication reconciliation, follow-up appointments, discharge orders, instructions, and significant lab and diagnostic studies  are as noted.   Medication changes: START bowel regimen miralax  BID and senna BID STOP Doxycycline 100mg  BID  Discharge Follow-up Action Items: Follow up with PCP in 1-2 weeks Worsened R hydronephrosis seen on CT imaging requires urology f/u. Continue monitoring Cr and urine output (both WNL while hospitalized) Ambulatory referral to heme/onc F/u with outpt Urology for decision on R nephrostomy tube placement for R hydronephrosis Start consistent bowel regiment   Patient's Ordered Code Status: Prior   Consults: None  Disposition: Hospice - Home  Patient Instructions:    Medication List     START taking these medications    polyethylene glycol 17 gram packet Commonly known as: GLYCOLAX  Take 17 g by mouth 2 (two) times a day.   senna 8.6 mg tablet Commonly known as: SENOKOT Take 2 tablets (17.2 mg total) by mouth 2 (two) times a day.       CHANGE how you take these medications    diazePAM  5 mg tablet Commonly known as: VALIUM  Take 1 tablet (5 mg total) by mouth daily for 10 days.       CONTINUE taking these medications    acetaminophen  160 mg/5 mL Susp Commonly known as: TYLENOL  Take 20 mL by mouth every 6 (six) hours as needed for fever 100.4 F or GREATER (pain).   * albuterol  0.63 mg/3 mL nebulizer solution Take 3 mL by nebulization every 6 (six) hours as needed.   * albuterol  HFA 90 mcg/actuation inhaler Commonly known as: PROVENTIL  HFA;VENTOLIN  HFA;PROAIR   HFA Inhale 2 puffs every 4 (four) hours as needed for wheezing or shortness of breath.   alum-mag hydroxide-simethicone  200-200-20 mg/5 mL Susp suspension Commonly known as: MAALOX, MYLANTA Take 30 mL by mouth every 4 (four) hours as needed.   apixaban  5 mg Tab Commonly known as: ELIQUIS  Take 5 mg by mouth 2 (two) times a day.   baclofen  10 mg tablet Commonly known as: LIORESAL  Take 10 mg by mouth daily as needed.   benztropine  0.5 mg tablet Commonly known as: COGENTIN  Take 1 tablet (0.5 mg total) by mouth nightly.   BisaCODYL  10 mg suppository Commonly known as: DULCOLAX Insert 10 mg into the rectum daily as needed.   buPROPion  150 mg 24 hr tablet Commonly known as: WELLBUTRIN  XL Take 150 mg by mouth daily.   calcium carbonate-vitamin D3 500 mg (200 mg Ca)-5 mcg (200 units Vit D) per tablet Take 1 tablet by mouth daily.   Children's Mucinex Multi-Symp 2.5-5-100 mg/5 mL Liqd Generic drug: phenylephrine -DM-guaiFENesin Take 10 mL by mouth every 4 (four) hours.   fentaNYL  75 mcg/hr patch Commonly known as: DURAGESIC  Place 1 patch on the skin every third day.   * guaiFENesin 100 mg/5 mL syrup Commonly known as: ROBITUSSIN Take 5 mL by mouth every 4 (four) hours.   * Mucinex 600 mg 12 hr tablet Generic drug: guaiFENesin Take 1 tablet by mouth in the morning and 1 tablet in the evening.   HYDROcodone -acetaminophen  5-325 mg per tablet Commonly known as: NORCO Take 1 tablet by mouth at bedtime.   Invega  Sustenna 234 mg/1.5 mL Syrg injection Generic drug: paliperidone  palmitate ADMINISTER 1.5 ML IN THE MUSCLE EVERY 21 DAYS   lactulose  10 gram/15 mL solution Commonly known as: CHRONULAC  Take 10 g by mouth 2 (two) times a day.   linaCLOtide  290 mcg Cap capsule Commonly known as: LINZESS  Take 290 mcg by mouth daily before breakfast.   memantine  5 mg tablet Commonly known as: NAMENDA  Take 1 tablet (5  mg total) by mouth 2 (two) times a day(morning and bedtime) (Take  1 tablet (5 mg total) by mouth 2 (two) times a day.)   metoprolol  tartrate 25 mg tablet Commonly known as: LOPRESSOR  Take 0.5 tablets by mouth 2 (two) times a day.   mirtazapine  15 mg tablet Commonly known as: REMERON  TAKE ONE TABLET BY MOUTH AT BEDTIME   Multi Complete with Iron 18-400 mg-mcg Tab tablet Generic drug: multivitamin-iron-folic acid  Take 1 tablet by mouth daily.   mupirocin 2 % ointment Commonly known as: BACTROBAN Apply 1 Application topically 3 (three) times a day.   NON FORMULARY Take 1 capsule by mouth daily. Lions mane   omeprazole  20 mg Tbec Take 1 tablet by mouth daily.   ondansetron  4 mg tablet Commonly known as: ZOFRAN  Take 4 mg by mouth every 8 (eight) hours as needed for nausea.   risperiDONE  0.5 mg tablet Commonly known as: RisperDAL  TAKE ONE TABLET (0.5mg ) BY MOUTH TWICE DAILY AS NEEDED   traMADoL  50 mg tablet Commonly known as: ULTRAM  Take 50 mg by mouth every 6 (six) hours as needed.   traZODone  50 mg tablet Commonly known as: DESYREL  Take 50 mg by mouth nightly.   VITAMIN C  ORAL Take 1 tablet by mouth daily.      * * This list has 4 medication(s) that are the same as other medications prescribed for you. Read the directions carefully, and ask your doctor or other care provider to review them with you.          STOP taking these medications    doxycycline 100 mg capsule Commonly known as: VIBRAMYCIN         Where to Get Your Medications     These medications were sent to [id number] - [place], [place] - [address] AT [facility] OF [address] - PHONE: [phone] - FAX: [phone]  [address], [place] [address]    Hours: 24-hours Phone: [phone]  polyethylene glycol 17 gram packet senna 8.6 mg tablet      Discharge Orders     Ambulatory referral to Oncology     Details:    Reason for Referral: Solid Tumor   Referring for Leukemia?: No   Referring for  Transplant or Cell Therapy?: No   Comments: Metastatic ovarian adenocarcinoma, per daughter's request   Ambulatory referral to Urology     Details:    Reason for Referral: Urology   Reason for Referral: Other   Please provide information: Moderate-severe hydronephrosis   Comments: Cr function currently fine, still able to urinate       Follow-up  No future appointments.  I saw and evaluated the patient. I reviewed the trainees note and agree. I performed the service or was physically present during the critical or key portions of the service furnished by the trainee; and I managed the patient.  Time spent on discharge was 25 minutes.  Patient admitted with generalized weakness and altered mental status. Suspect possibly related to polypharmacy as mentation improved with holding some of her home medications. Patient to continue with hospice services on discharge.  She was noted to have right hydronephrosis on her initial CT imaging. Urology consulted and paient to have outpatient follow-up with them.  Elsie Karolynn Rover, MD 02/12/2024  *Some images could not be shown.

## 2024-02-15 ENCOUNTER — Telehealth: Admitting: Family Medicine

## 2024-02-15 ENCOUNTER — Ambulatory Visit: Admitting: Family Medicine

## 2024-02-15 ENCOUNTER — Encounter: Payer: Self-pay | Admitting: Family Medicine

## 2024-02-15 VITALS — Ht 62.0 in

## 2024-02-15 DIAGNOSIS — R1085 Abdominal pain of multiple sites: Secondary | ICD-10-CM

## 2024-02-16 DIAGNOSIS — R109 Unspecified abdominal pain: Secondary | ICD-10-CM | POA: Insufficient documentation

## 2024-02-16 MED ORDER — LISINOPRIL 10 MG PO TABS: 10.0000 mg | ORAL_TABLET | Freq: Every day | ORAL | 3 refills | Status: AC

## 2024-02-16 MED ORDER — FENTANYL 75 MCG/HR TD PT72: 1.0000 | MEDICATED_PATCH | TRANSDERMAL | 0 refills | Status: AC

## 2024-02-16 NOTE — Progress Notes (Signed)
" ° ° °  Subjective   Patient ID: Kristen Colon, female    DOB: [birth year]  Age: 74 y.o. MRN: [id number]  Chief Complaint  Patient presents with   Medical Management of Chronic Issues    Pt location: home Provider location: [facility] Method: video attempted - no audio - switched to phone call Duration: 25 min  History of Present Illness   Kristen Colon is a 74 year old female with a history of cancer who presents with increased pain and recent hospitalization for pneumonia.  She was treated for presumed pneumonia diagnosed at urgent care and hospitalized soon after. She had cough, weakness, and shortness of breath at that time. She is now recovering, breathing comfortably, and able to walk to the bathroom on her own.  She has increased pain on the side of her tumor. Because she rarely complains of pain, her recent reports of pain prompted starting a fentanyl  patch. In the hospital she reported frequent pain to staff but not always to her daughter. Her daughter is worried about confusion from hydrocodone  and is interested in adjusting her pain regimen, including use of lidocaine  patches, which have reduced her pain in the past.          The ASCVD Risk score (Arnett DK, et al., 2019) failed to calculate for the following reasons:   Cannot find a previous HDL lab   Cannot find a previous total cholesterol lab   * - Cholesterol units were assumed  Health Maintenance Due  Topic Date Due   FOOT EXAM  Never done   OPHTHALMOLOGY EXAM  Never done   Hepatitis C Screening  Never done   DTaP/Tdap/Td (1 - Tdap) Never done   Zoster Vaccines- Shingrix (1 of 2) Never done   Mammogram  Never done   Colonoscopy  Never done   Bone Density Scan  Never done   Pneumococcal Vaccine: 50+ Years (2 of 2 - PCV) 09/21/2017   COVID-19 Vaccine (3 - Pfizer risk series) 08/09/2019   HEMOGLOBIN A1C  12/23/2023      Objective:     Ht 5' 2 (1.575 m)   LMP  (LMP Unknown) Comment: years ago per Pt   BMI 25.97 kg/m    Physical Exam            No results found for any visits on 02/15/24.      Assessment & Plan:   Abdominal pain of multiple sites Assessment & Plan: Metastatic cancer with increased pain and urinary tract involvement. CT scan at recent hospitalization showed Moderate to severe right hydroureteronephrosis that could be due to extrinsic compression.  No definite obstructing stone.   Pain management complicated by reluctance to report pain and her schizophrenia. Recent hospitalization Nephrostomy considered due to bladder pressure and kidney swelling. - Continue fentanyl  patch for pain management. - Consider otc lidocaine  patch for localized pain relief. - Monitor for signs of confusion due to pain medication. - Discuss potential IR nephrostomy placement for palliative pain relief if pain worsens   Other orders -     fentaNYL ; Place 1 patch onto the skin every 3 (three) days.  Dispense: 10 patch; Refill: 0 -     Lisinopril ; Take 1 tablet (10 mg total) by mouth daily.  Dispense: 90 tablet; Refill: 3              No follow-ups on file.    Toribio MARLA Slain, MD  "

## 2024-02-16 NOTE — Assessment & Plan Note (Signed)
 Metastatic cancer with increased pain and urinary tract involvement. CT scan at recent hospitalization showed Moderate to severe right hydroureteronephrosis that could be due to extrinsic compression.  No definite obstructing stone.   Pain management complicated by reluctance to report pain and her schizophrenia. Recent hospitalization Nephrostomy considered due to bladder pressure and kidney swelling. - Continue fentanyl  patch for pain management. - Consider otc lidocaine  patch for localized pain relief. - Monitor for signs of confusion due to pain medication. - Discuss potential IR nephrostomy placement for palliative pain relief if pain worsens

## 2024-02-19 ENCOUNTER — Telehealth: Payer: Self-pay

## 2024-02-19 NOTE — Telephone Encounter (Signed)
 Called [facility] to let her know that we haven't received the paperwork.
